# Patient Record
Sex: Female | Born: 1955
Health system: Southern US, Community
[De-identification: ages and names within clinical notes are randomized; demographics above are authoritative.]

## PROBLEM LIST (undated history)

## (undated) DIAGNOSIS — C539 Malignant neoplasm of cervix uteri, unspecified: Secondary | ICD-10-CM

## (undated) DIAGNOSIS — E785 Hyperlipidemia, unspecified: Secondary | ICD-10-CM

## (undated) DIAGNOSIS — C801 Malignant (primary) neoplasm, unspecified: Secondary | ICD-10-CM

## (undated) DIAGNOSIS — I10 Essential (primary) hypertension: Secondary | ICD-10-CM

## (undated) DIAGNOSIS — IMO0001 Reserved for inherently not codable concepts without codable children: Secondary | ICD-10-CM

## (undated) DIAGNOSIS — IMO0002 Reserved for concepts with insufficient information to code with codable children: Secondary | ICD-10-CM

## (undated) HISTORY — DX: Reserved for inherently not codable concepts without codable children: IMO0001

## (undated) HISTORY — DX: Reserved for concepts with insufficient information to code with codable children: IMO0002

## (undated) HISTORY — DX: Hyperlipidemia, unspecified: E78.5

## (undated) HISTORY — DX: Malignant neoplasm of cervix uteri, unspecified: C53.9

## (undated) HISTORY — PX: TUBAL LIGATION: SHX77

## (undated) HISTORY — PX: ABDOMINAL HYSTERECTOMY: SHX81

---

## 1997-12-02 ENCOUNTER — Ambulatory Visit (HOSPITAL_COMMUNITY): Admission: RE | Admit: 1997-12-02 | Discharge: 1997-12-02 | Payer: Self-pay | Admitting: Family Medicine

## 2000-05-15 ENCOUNTER — Encounter: Payer: Self-pay | Admitting: *Deleted

## 2000-05-15 ENCOUNTER — Encounter: Admission: RE | Admit: 2000-05-15 | Discharge: 2000-05-15 | Payer: Self-pay | Admitting: *Deleted

## 2000-07-01 ENCOUNTER — Other Ambulatory Visit: Admission: RE | Admit: 2000-07-01 | Discharge: 2000-07-01 | Payer: Self-pay | Admitting: *Deleted

## 2000-07-26 ENCOUNTER — Other Ambulatory Visit: Admission: RE | Admit: 2000-07-26 | Discharge: 2000-07-26 | Payer: Self-pay | Admitting: *Deleted

## 2000-07-26 ENCOUNTER — Encounter (INDEPENDENT_AMBULATORY_CARE_PROVIDER_SITE_OTHER): Payer: Self-pay

## 2000-08-13 ENCOUNTER — Other Ambulatory Visit: Admission: RE | Admit: 2000-08-13 | Discharge: 2000-08-13 | Payer: Self-pay | Admitting: *Deleted

## 2000-08-13 ENCOUNTER — Encounter (INDEPENDENT_AMBULATORY_CARE_PROVIDER_SITE_OTHER): Payer: Self-pay | Admitting: Specialist

## 2000-12-16 ENCOUNTER — Other Ambulatory Visit: Admission: RE | Admit: 2000-12-16 | Discharge: 2000-12-16 | Payer: Self-pay | Admitting: Obstetrics and Gynecology

## 2000-12-17 ENCOUNTER — Encounter (INDEPENDENT_AMBULATORY_CARE_PROVIDER_SITE_OTHER): Payer: Self-pay | Admitting: Specialist

## 2000-12-17 ENCOUNTER — Other Ambulatory Visit: Admission: RE | Admit: 2000-12-17 | Discharge: 2000-12-17 | Payer: Self-pay | Admitting: *Deleted

## 2001-11-14 ENCOUNTER — Encounter: Admission: RE | Admit: 2001-11-14 | Discharge: 2001-11-14 | Payer: Self-pay | Admitting: *Deleted

## 2001-11-14 ENCOUNTER — Encounter (INDEPENDENT_AMBULATORY_CARE_PROVIDER_SITE_OTHER): Payer: Self-pay | Admitting: Specialist

## 2001-11-14 ENCOUNTER — Encounter: Payer: Self-pay | Admitting: *Deleted

## 2003-06-09 ENCOUNTER — Encounter: Admission: RE | Admit: 2003-06-09 | Discharge: 2003-06-09 | Payer: Self-pay | Admitting: Obstetrics and Gynecology

## 2003-07-06 ENCOUNTER — Other Ambulatory Visit: Admission: RE | Admit: 2003-07-06 | Discharge: 2003-07-06 | Payer: Self-pay | Admitting: Obstetrics and Gynecology

## 2003-07-22 ENCOUNTER — Other Ambulatory Visit: Admission: RE | Admit: 2003-07-22 | Discharge: 2003-07-22 | Payer: Self-pay | Admitting: Obstetrics and Gynecology

## 2004-06-05 ENCOUNTER — Emergency Department (HOSPITAL_COMMUNITY): Admission: EM | Admit: 2004-06-05 | Discharge: 2004-06-05 | Payer: Self-pay | Admitting: Emergency Medicine

## 2004-12-04 ENCOUNTER — Encounter: Admission: RE | Admit: 2004-12-04 | Discharge: 2004-12-04 | Payer: Self-pay | Admitting: Obstetrics and Gynecology

## 2006-03-06 ENCOUNTER — Encounter: Admission: RE | Admit: 2006-03-06 | Discharge: 2006-03-06 | Payer: Self-pay | Admitting: Obstetrics and Gynecology

## 2006-03-08 ENCOUNTER — Emergency Department (HOSPITAL_COMMUNITY): Admission: EM | Admit: 2006-03-08 | Discharge: 2006-03-08 | Payer: Self-pay | Admitting: Family Medicine

## 2006-03-11 ENCOUNTER — Emergency Department (HOSPITAL_COMMUNITY): Admission: EM | Admit: 2006-03-11 | Discharge: 2006-03-11 | Payer: Self-pay | Admitting: Family Medicine

## 2006-03-13 ENCOUNTER — Encounter: Admission: RE | Admit: 2006-03-13 | Discharge: 2006-03-13 | Payer: Self-pay | Admitting: Emergency Medicine

## 2006-06-13 ENCOUNTER — Encounter: Admission: RE | Admit: 2006-06-13 | Discharge: 2006-06-13 | Payer: Self-pay | Admitting: Diagnostic Radiology

## 2006-06-20 ENCOUNTER — Encounter: Admission: RE | Admit: 2006-06-20 | Discharge: 2006-06-20 | Payer: Self-pay | Admitting: Diagnostic Radiology

## 2006-06-20 ENCOUNTER — Emergency Department (HOSPITAL_COMMUNITY): Admission: EM | Admit: 2006-06-20 | Discharge: 2006-06-20 | Payer: Self-pay | Admitting: Family Medicine

## 2006-06-27 ENCOUNTER — Ambulatory Visit: Payer: Self-pay | Admitting: Family Medicine

## 2006-07-08 ENCOUNTER — Ambulatory Visit: Payer: Self-pay | Admitting: Family Medicine

## 2006-07-08 ENCOUNTER — Other Ambulatory Visit: Admission: RE | Admit: 2006-07-08 | Discharge: 2006-07-08 | Payer: Self-pay | Admitting: Family Medicine

## 2006-07-16 ENCOUNTER — Encounter: Admission: RE | Admit: 2006-07-16 | Discharge: 2006-07-16 | Payer: Self-pay | Admitting: Diagnostic Radiology

## 2007-02-12 ENCOUNTER — Encounter: Admission: RE | Admit: 2007-02-12 | Discharge: 2007-02-12 | Payer: Self-pay | Admitting: Diagnostic Radiology

## 2007-12-04 ENCOUNTER — Encounter: Admission: RE | Admit: 2007-12-04 | Discharge: 2007-12-04 | Payer: Self-pay | Admitting: Family Medicine

## 2007-12-04 ENCOUNTER — Ambulatory Visit: Payer: Self-pay | Admitting: Family Medicine

## 2007-12-23 ENCOUNTER — Ambulatory Visit: Payer: Self-pay | Admitting: Family Medicine

## 2008-04-27 ENCOUNTER — Emergency Department (HOSPITAL_COMMUNITY): Admission: EM | Admit: 2008-04-27 | Discharge: 2008-04-27 | Payer: Self-pay | Admitting: Emergency Medicine

## 2009-07-02 HISTORY — PX: COLONOSCOPY: SHX174

## 2009-10-18 ENCOUNTER — Encounter: Payer: Self-pay | Admitting: Gastroenterology

## 2009-10-18 ENCOUNTER — Ambulatory Visit: Payer: Self-pay | Admitting: Family Medicine

## 2009-10-20 ENCOUNTER — Encounter (INDEPENDENT_AMBULATORY_CARE_PROVIDER_SITE_OTHER): Payer: Self-pay | Admitting: *Deleted

## 2009-10-24 ENCOUNTER — Ambulatory Visit: Payer: Self-pay | Admitting: Gastroenterology

## 2009-10-28 ENCOUNTER — Encounter: Admission: RE | Admit: 2009-10-28 | Discharge: 2009-10-28 | Payer: Self-pay | Admitting: Family Medicine

## 2009-10-31 ENCOUNTER — Ambulatory Visit: Payer: Self-pay | Admitting: Gastroenterology

## 2010-07-23 ENCOUNTER — Encounter: Payer: Self-pay | Admitting: Obstetrics and Gynecology

## 2010-07-23 ENCOUNTER — Encounter: Payer: Self-pay | Admitting: Diagnostic Radiology

## 2010-08-01 NOTE — Letter (Signed)
Summary: [facility] Instructions  [facility]  [address] [place], [place] [address]   Phone: [phone]  Fax: [phone]       Martha Koch    [birth year]    MRN: [id number]        Procedure Day Dorna Bloom:  Duanne Limerick  10/31/09     Arrival Time:  8:00AM     Procedure Time:  9:00AM     Location of Procedure:                    _ X_  [facility] (4th Floor)                        PREPARATION FOR COLONOSCOPY WITH MOVIPREP   Starting 5 days prior to your procedure 10/26/09 do not eat nuts, seeds, popcorn, corn, beans, peas,  salads, or any raw vegetables.  Do not take any fiber supplements (e.g. Metamucil, Citrucel, and Benefiber).  THE DAY BEFORE YOUR PROCEDURE         DATE: 10/30/09  DAY: SUNDAY  1.  Drink clear liquids the entire day-NO SOLID FOOD  2.  Do not drink anything colored red or purple.  Avoid juices with pulp.  No orange juice.  3.  Drink at least 64 oz. (8 glasses) of fluid/clear liquids during the day to prevent dehydration and help the prep work efficiently.  CLEAR LIQUIDS INCLUDE: Water Jello Ice Popsicles Tea (sugar ok, no milk/cream) Powdered fruit flavored drinks Coffee (sugar ok, no milk/cream) Gatorade Juice: apple, white grape, white cranberry  Lemonade Clear bullion, consomm, broth Carbonated beverages (any kind) Strained chicken noodle soup Hard Candy                             4.  In the morning, mix first dose of MoviPrep solution:    Empty 1 Pouch A and 1 Pouch B into the disposable container    Add lukewarm drinking water to the top line of the container. Mix to dissolve    Refrigerate (mixed solution should be used within 24 hrs)  5.  Begin drinking the prep at 5:00 p.m. The MoviPrep container is divided by 4 marks.   Every 15 minutes drink the solution down to the next mark (approximately 8 oz) until the full liter is complete.   6.  Follow completed prep with 16 oz of clear liquid of your choice (Nothing  red or purple).  Continue to drink clear liquids until bedtime.  7.  Before going to bed, mix second dose of MoviPrep solution:    Empty 1 Pouch A and 1 Pouch B into the disposable container    Add lukewarm drinking water to the top line of the container. Mix to dissolve    Refrigerate  THE DAY OF YOUR PROCEDURE      DATE: 10/31/09  DAY: MONDAY  Beginning at 4:00AM (5 hours before procedure):         1. Every 15 minutes, drink the solution down to the next mark (approx 8 oz) until the full liter is complete.  2. Follow completed prep with 16 oz. of clear liquid of your choice.    3. You may drink clear liquids until 7:00AM (2 HOURS BEFORE PROCEDURE).   MEDICATION INSTRUCTIONS  Unless otherwise instructed, you should take regular prescription medications with a small sip of water   as early as possible the morning  of your procedure.    Additional medication instructions: n/a         OTHER INSTRUCTIONS  You will need a responsible adult at least 56 years of age to accompany you and drive you home.   This person must remain in the waiting room during your procedure.  Wear loose fitting clothing that is easily removed.  Leave jewelry and other valuables at home.  However, you may wish to bring a book to read or  an iPod/MP3 player to listen to music as you wait for your procedure to start.  Remove all body piercing jewelry and leave at home.  Total time from sign-in until discharge is approximately 2-3 hours.  You should go home directly after your procedure and rest.  You can resume normal activities the  day after your procedure.  The day of your procedure you should not:   Drive   Make legal decisions   Operate machinery   Drink alcohol   Return to work  You will receive specific instructions about eating, activities and medications before you leave.    The above instructions have been reviewed and explained to me by   Sherren Kerns RN  October 24, 2009  10:08 AM    I fully understand and can verbalize these instructions _____________________________ Date _________

## 2010-08-01 NOTE — Letter (Signed)
Summary: Ronnald Nian MD  Ronnald Nian MD   Imported By: Sherian Rein 11/08/2009 09:18:25  _____________________________________________________________________  External Attachment:    Type:   Image     Comment:   External Document

## 2010-08-01 NOTE — Miscellaneous (Signed)
Summary: previsit/rm  Clinical Lists Changes  Medications: Added new medication of MOVIPREP 100 GM  SOLR (PEG-KCL-NACL-NASULF-NA ASC-C) As per prep instructions. - Signed Rx of MOVIPREP 100 GM  SOLR (PEG-KCL-NACL-NASULF-NA ASC-C) As per prep instructions.;  #1 x 0;  Signed;  Entered by: Sherren Kerns RN;  Authorized by: Mardella Layman MD [facility];  Method used: Electronically to [facility] [id number], [place], [place], [place]  [address], Ph: [phone], Fax: [phone] Allergies: Added new allergy or adverse reaction of PCN Added new allergy or adverse reaction of ASA Observations: Added new observation of NKA: F (10/24/2009 9:58)    Prescriptions: MOVIPREP 100 GM  SOLR (PEG-KCL-NACL-NASULF-NA ASC-C) As per prep instructions.  #1 x 0   Entered by:   Sherren Kerns RN   Authorized by:   Mardella Layman MD [facility]   Signed by:   Sherren Kerns RN on 10/24/2009   Method used:   Electronically to        [facility] [id number] (retail)       [address] [place], [place]  [address]       Ph: [phone]       Fax: [phone]   RxID:   [id number]

## 2010-08-01 NOTE — Procedures (Signed)
Summary: Colonoscopy  Patient: Martha Koch Note: All result statuses are Final unless otherwise noted.  Tests: (1) Colonoscopy (COL)   COL Colonoscopy           DONE     [facility]     [address]     [place], [place]  [address]           COLONOSCOPY PROCEDURE REPORT           PATIENT:  Martha Koch, Martha Koch  MR#:  [id number]     BIRTHDATE:  05-11-1956, 53 yrs. old  GENDER:  female     ENDOSCOPIST:  Vania Rea. Jarold Motto, MD, [facility]     REF. BY:  Sharlot Gowda, M.D.     PROCEDURE DATE:  10/31/2009     PROCEDURE:  Average-risk screening colonoscopy     G0121     ASA CLASS:  Class I     INDICATIONS:  Routine Risk Screening     MEDICATIONS:   Fentanyl 50 mcg IV, Versed 7 mg IV           DESCRIPTION OF PROCEDURE:   After the risks benefits and     alternatives of the procedure were thoroughly explained, informed     consent was obtained.  Digital rectal exam was performed and     revealed no abnormalities.   The LB CF-[id number] endoscope     was introduced through the anus and advanced to the cecum, which     was identified by both the appendix and ileocecal valve, without     limitations.  The quality of the prep was good, using MoviPrep.     The instrument was then slowly withdrawn as the colon was fully     examined.     <<PROCEDUREIMAGES>>           FINDINGS:  No polyps or cancers were seen.  This was otherwise a     normal examination of the colon.   Retroflexed views in the rectum     revealed no abnormalities.    The scope was then withdrawn from     the patient and the procedure completed.           COMPLICATIONS:  None     ENDOSCOPIC IMPRESSION:     1) No polyps or cancers     2) Otherwise normal examination     RECOMMENDATIONS:     1) Continue current colorectal screening recommendations for     "routine risk" patients with a repeat colonoscopy in 10 years.     REPEAT EXAM:  No           ______________________________     Vania Rea. Jarold Motto, MD,  Clementeen Graham           CC:           n.     eSIGNED:   Vania Rea. [facility] at 10/31/2009 09:55 AM           Annye Rusk, [id number]  Note: An exclamation mark (!) indicates a result that was not dispersed into the flowsheet. Document Creation Date: 10/31/2009 9:56 AM _______________________________________________________________________  (1) Order result status: Final Collection or observation date-time: 10/31/2009 09:47 Requested date-time:  Receipt date-time:  Reported date-time:  Referring Physician:   Ordering Physician: Sheryn Bison [phone]) Specimen Source:  Source: Launa Grill Order Number: [id number] Lab site:   Appended Document: Colonoscopy    Clinical Lists Changes  Observations: Added new observation of COLONNXTDUE: 10/2019 (  10/31/2009 16:14)     

## 2012-06-02 ENCOUNTER — Other Ambulatory Visit: Payer: Self-pay | Admitting: Family Medicine

## 2012-06-02 DIAGNOSIS — Z1231 Encounter for screening mammogram for malignant neoplasm of breast: Secondary | ICD-10-CM

## 2012-07-07 ENCOUNTER — Ambulatory Visit
Admission: RE | Admit: 2012-07-07 | Discharge: 2012-07-07 | Disposition: A | Discharge status: Home / Self Care | Payer: BC Managed Care – PPO | Source: Ambulatory Visit | Attending: Family Medicine | Admitting: Family Medicine

## 2012-07-07 DIAGNOSIS — Z1231 Encounter for screening mammogram for malignant neoplasm of breast: Secondary | ICD-10-CM

## 2014-02-22 ENCOUNTER — Emergency Department (HOSPITAL_COMMUNITY)
Admission: EM | Admit: 2014-02-22 | Discharge: 2014-02-23 | Disposition: A | Discharge status: Home / Self Care | Payer: Self-pay | Attending: Emergency Medicine | Admitting: Emergency Medicine

## 2014-02-22 ENCOUNTER — Encounter (HOSPITAL_COMMUNITY): Payer: Self-pay | Admitting: Emergency Medicine

## 2014-02-22 DIAGNOSIS — N939 Abnormal uterine and vaginal bleeding, unspecified: Secondary | ICD-10-CM

## 2014-02-22 DIAGNOSIS — H9209 Otalgia, unspecified ear: Secondary | ICD-10-CM | POA: Insufficient documentation

## 2014-02-22 DIAGNOSIS — Z880 Allergy status to penicillin: Secondary | ICD-10-CM | POA: Insufficient documentation

## 2014-02-22 DIAGNOSIS — Z79899 Other long term (current) drug therapy: Secondary | ICD-10-CM | POA: Insufficient documentation

## 2014-02-22 DIAGNOSIS — F17200 Nicotine dependence, unspecified, uncomplicated: Secondary | ICD-10-CM | POA: Insufficient documentation

## 2014-02-22 DIAGNOSIS — I10 Essential (primary) hypertension: Secondary | ICD-10-CM | POA: Insufficient documentation

## 2014-02-22 DIAGNOSIS — R634 Abnormal weight loss: Secondary | ICD-10-CM | POA: Insufficient documentation

## 2014-02-22 DIAGNOSIS — D251 Intramural leiomyoma of uterus: Secondary | ICD-10-CM | POA: Insufficient documentation

## 2014-02-22 DIAGNOSIS — A599 Trichomoniasis, unspecified: Secondary | ICD-10-CM

## 2014-02-22 DIAGNOSIS — N898 Other specified noninflammatory disorders of vagina: Secondary | ICD-10-CM | POA: Insufficient documentation

## 2014-02-22 NOTE — ED Notes (Signed)
Pt states last night her right ear was hurting so bad it woke her up out of her sleep  Pt states she has had dizziness associated with the pain  Pt states last night she called EMS and they came out and checked her and her blood pressure was high  Pt states she has not had any sleep due to the ear pain and she came today for her ear and her blood pressure  Pt has never been on blood pressure medication

## 2014-02-22 NOTE — ED Notes (Signed)
Pt states she has been having brown discharge for several months and would like to have that checked while she is here

## 2014-02-23 ENCOUNTER — Encounter: Payer: Self-pay | Admitting: Obstetrics & Gynecology

## 2014-02-23 ENCOUNTER — Emergency Department (HOSPITAL_COMMUNITY): Payer: Self-pay

## 2014-02-23 LAB — URINALYSIS, ROUTINE W REFLEX MICROSCOPIC
Bilirubin Urine: NEGATIVE
GLUCOSE, UA: NEGATIVE mg/dL
Ketones, ur: NEGATIVE mg/dL
NITRITE: NEGATIVE
PROTEIN: NEGATIVE mg/dL
Specific Gravity, Urine: 1.016 (ref 1.005–1.030)
UROBILINOGEN UA: 0.2 mg/dL (ref 0.0–1.0)
pH: 6.5 (ref 5.0–8.0)

## 2014-02-23 LAB — WET PREP, GENITAL: YEAST WET PREP: NONE SEEN

## 2014-02-23 LAB — CBC WITH DIFFERENTIAL/PLATELET
Basophils Absolute: 0 10*3/uL (ref 0.0–0.1)
Basophils Relative: 0 % (ref 0–1)
Eosinophils Absolute: 0.1 10*3/uL (ref 0.0–0.7)
Eosinophils Relative: 1 % (ref 0–5)
HCT: 39.5 % (ref 36.0–46.0)
Hemoglobin: 13.1 g/dL (ref 12.0–15.0)
Lymphocytes Relative: 49 % — ABNORMAL HIGH (ref 12–46)
Lymphs Abs: 3.4 10*3/uL (ref 0.7–4.0)
MCH: 26.3 pg (ref 26.0–34.0)
MCHC: 33.2 g/dL (ref 30.0–36.0)
MCV: 79.3 fL (ref 78.0–100.0)
Monocytes Absolute: 0.5 10*3/uL (ref 0.1–1.0)
Monocytes Relative: 7 % (ref 3–12)
Neutro Abs: 3 10*3/uL (ref 1.7–7.7)
Neutrophils Relative %: 43 % (ref 43–77)
Platelets: 307 10*3/uL (ref 150–400)
RBC: 4.98 MIL/uL (ref 3.87–5.11)
RDW: 14.5 % (ref 11.5–15.5)
WBC: 7 10*3/uL (ref 4.0–10.5)

## 2014-02-23 LAB — URINE MICROSCOPIC-ADD ON

## 2014-02-23 LAB — BASIC METABOLIC PANEL
ANION GAP: 14 (ref 5–15)
BUN: 10 mg/dL (ref 6–23)
CO2: 25 mEq/L (ref 19–32)
CREATININE: 0.94 mg/dL (ref 0.50–1.10)
Calcium: 9.7 mg/dL (ref 8.4–10.5)
Chloride: 103 mEq/L (ref 96–112)
GFR calc non Af Amer: 66 mL/min — ABNORMAL LOW (ref >90)
GFR, EST AFRICAN AMERICAN: 76 mL/min — AB (ref >90)
Glucose, Bld: 102 mg/dL — ABNORMAL HIGH (ref 70–99)
Potassium: 4.1 mEq/L (ref 3.7–5.3)
Sodium: 142 mEq/L (ref 137–147)

## 2014-02-23 MED ORDER — ACETAMINOPHEN 325 MG PO TABS
650.0000 mg | ORAL_TABLET | Freq: Once | ORAL | Status: AC
  Administered 2014-02-23: 650 mg via ORAL

## 2014-02-23 MED ORDER — METRONIDAZOLE 500 MG PO TABS
2000.0000 mg | ORAL_TABLET | Freq: Once | ORAL | Status: AC
  Administered 2014-02-23: 2000 mg via ORAL
  Filled 2014-02-23: qty 4

## 2014-02-23 NOTE — ED Notes (Signed)
Pt states she has pain in her her and discharge from vagina.

## 2014-02-23 NOTE — ED Provider Notes (Signed)
CSN: [id number]     Arrival date & time 02/22/14  1918 History   First MD Initiated Contact with Patient 02/22/14 2306     Chief Complaint  Patient presents with  . Otalgia  . Vaginal Discharge     (Consider location/radiation/quality/duration/timing/severity/associated sxs/prior Treatment) HPI Comments: 58 year old female  with no significant medical history, smoker presents with vaginal discharge brown for the past several months, weight loss and right ear pain. Patient has had little to discharge each day brown discoloration. Patient has not been sexually active for years, denies bleeding. No known ovarian or uterine pathology in the past. No known cancer. Patient has not followed up regularly with a physician and does not have a current gynecologist. Patient also has mild right ear pain constant decrease his ability to sleep. Patient is not on medicines for blood pressure and has no known diagnosis.   Patient is a 58 y.o. female presenting with ear pain and vaginal discharge. The history is provided by the patient.  Otalgia Associated symptoms: no abdominal pain, no congestion, no fever, no headaches, no neck pain, no rash and no vomiting   Vaginal Discharge Associated symptoms: no abdominal pain, no dysuria, no fever and no vomiting     History reviewed. No pertinent past medical history. History reviewed. No pertinent past surgical history. Family History  Problem Relation Age of Onset  . Cancer Other   . Diabetes Other    History  Substance Use Topics  . Smoking status: Current Every Day Smoker    Types: Cigarettes  . Smokeless tobacco: Not on file  . Alcohol Use: No   OB History   Grav Para Term Preterm Abortions TAB SAB Ect Mult Living                 Review of Systems  Constitutional: Positive for unexpected weight change (30 pounds 2 months). Negative for fever and chills.  HENT: Positive for ear pain. Negative for congestion.   Eyes: Negative for visual  disturbance.  Respiratory: Negative for shortness of breath.   Cardiovascular: Negative for chest pain.  Gastrointestinal: Negative for vomiting and abdominal pain.  Genitourinary: Positive for vaginal discharge. Negative for dysuria, flank pain and decreased urine volume.  Musculoskeletal: Negative for back pain, neck pain and neck stiffness.  Skin: Negative for rash.  Neurological: Negative for light-headedness and headaches.      Allergies  Aspirin and Penicillins  Home Medications   Prior to Admission medications   Medication Sig Start Date End Date Taking? Authorizing Provider  Multiple Vitamin (MULTIVITAMIN WITH MINERALS) TABS tablet Take 1 tablet by mouth daily.   Yes Historical Provider, MD   BP 167/87  Pulse 74  Temp(Src) 97.8 F (36.6 C) (Oral)  Resp 16  SpO2 100% Physical Exam  Nursing note and vitals reviewed. Constitutional: She is oriented to person, place, and time. She appears well-developed and well-nourished.  HENT:  Head: Normocephalic and atraumatic.  Eyes: Conjunctivae are normal. Right eye exhibits no discharge. Left eye exhibits no discharge.  Neck: Normal range of motion. Neck supple. No tracheal deviation present.  Cardiovascular: Normal rate and regular rhythm.   Pulmonary/Chest: Effort normal and breath sounds normal.  Abdominal: Soft. She exhibits no distension. There is no tenderness. There is no guarding.  Genitourinary:    friable cervix, mild brown discharge and small amount of, no focal tenderness on exam.  Musculoskeletal: She exhibits no edema.  Neurological: She is alert and oriented to person, place, and time.  Skin: Skin is warm. No rash noted.  Psychiatric: She has a normal mood and affect.    ED Course  Procedures (including critical care time) Labs Review Labs Reviewed  WET PREP, GENITAL - Abnormal; Notable for the following:    Trich, Wet Prep MODERATE (*)    Clue Cells Wet Prep HPF POC RARE (*)    WBC, Wet Prep HPF POC FEW  (*)    All other components within normal limits  URINALYSIS, ROUTINE W REFLEX MICROSCOPIC - Abnormal; Notable for the following:    APPearance CLOUDY (*)    Hgb urine dipstick SMALL (*)    Leukocytes, UA MODERATE (*)    All other components within normal limits  CBC WITH DIFFERENTIAL - Abnormal; Notable for the following:    Lymphocytes Relative 49 (*)    All other components within normal limits  BASIC METABOLIC PANEL - Abnormal; Notable for the following:    Glucose, Bld 102 (*)    GFR calc non Af Amer 66 (*)    GFR calc Af Amer 76 (*)    All other components within normal limits  URINE MICROSCOPIC-ADD ON - Abnormal; Notable for the following:    Bacteria, UA FEW (*)    All other components within normal limits  GC/CHLAMYDIA PROBE AMP    Imaging Review US Transvaginal Non-ob  02/23/2014   CLINICAL DATA:  Vaginal discharge and bleeding. Postmenopausal patient.  EXAM: TRANSABDOMINAL AND TRANSVAGINAL ULTRASOUND OF PELVIS  TECHNIQUE: Both transabdominal and transvaginal ultrasound examinations of the pelvis were performed. Transabdominal technique was performed for global imaging of the pelvis including uterus, ovaries, adnexal regions, and pelvic cul-de-sac. It was necessary to proceed with endovaginal exam following the transabdominal exam to visualize the ovaries and endometrium.  COMPARISON:  None  FINDINGS: Uterus  Measurements: 9.3 x 6.2 x 9.4 cm. Heterogeneous nodular enlargement of the uterus consistent with multiple fibroids. The largest measuring 2.7 cm maximal diameter.  Endometrium  Thickness: 5.8 mm. Limited visualization due to displacement and distortion because of the uterine fibroids.  Right ovary  Measurements: 8.1 x 7 x 7.6 cm. Large cyst in the right ovary measures 5.7 x 6.4 x 6.5 cm. Single thickened septation with mild peripheral nodularity. No evidence of flow in the nodules or septation. Homogeneous anechoic appearance otherwise.  Left ovary  Not visualized.  No adnexal  masses seen on the left.  Other findings  No free fluid.  IMPRESSION: Multiple uterine fibroids. 6.5 cm cyst with thick septation and nonvascular mural nodules in the right ovary. This lesion is indeterminate and follow-up MRI or surgical evaluation should be considered. This recommendation follows the consensus statement: Management of Asymptomatic Ovarian and Other Adnexal Cysts Imaged at [place]: Society of Radiologists in [place]. Radiology 2010; [phone].   Electronically Signed   By: Lucienne Capers M.D.   On: 02/23/2014 01:57   US Pelvis Complete  02/23/2014   CLINICAL DATA:  Vaginal discharge and bleeding. Postmenopausal patient.  EXAM: TRANSABDOMINAL AND TRANSVAGINAL ULTRASOUND OF PELVIS  TECHNIQUE: Both transabdominal and transvaginal ultrasound examinations of the pelvis were performed. Transabdominal technique was performed for global imaging of the pelvis including uterus, ovaries, adnexal regions, and pelvic cul-de-sac. It was necessary to proceed with endovaginal exam following the transabdominal exam to visualize the ovaries and endometrium.  COMPARISON:  None  FINDINGS: Uterus  Measurements: 9.3 x 6.2 x 9.4 cm. Heterogeneous nodular enlargement of the uterus consistent with multiple fibroids. The largest measuring 2.7 cm maximal diameter.  Endometrium  Thickness: 5.8 mm. Limited visualization due to displacement and distortion because of the uterine fibroids.  Right ovary  Measurements: 8.1 x 7 x 7.6 cm. Large cyst in the right ovary measures 5.7 x 6.4 x 6.5 cm. Single thickened septation with mild peripheral nodularity. No evidence of flow in the nodules or septation. Homogeneous anechoic appearance otherwise.  Left ovary  Not visualized.  No adnexal masses seen on the left.  Other findings  No free fluid.  IMPRESSION: Multiple uterine fibroids. 6.5 cm cyst with thick septation and nonvascular mural nodules in the right ovary. This lesion is indeterminate and  follow-up MRI or surgical evaluation should be considered. This recommendation follows the consensus statement: Management of Asymptomatic Ovarian and Other Adnexal Cysts Imaged at [place]: Society of Radiologists in [place]. Radiology 2010; [phone].   Electronically Signed   By: Lucienne Capers M.D.   On: 02/23/2014 01:57     EKG Interpretation None      MDM   Final diagnoses:  Essential hypertension  Vaginal bleeding  Vaginal discharge  Weight loss  Trichomonas  Patient with vaginal discharge weight loss concern for vaginal infection versus malignancy. Patient is not a primary Dr. thus ultrasound was ordered in ER to help arrange close followup with gynecology. Trichomonas positive Flagyl given. Blood pressure mild elevated discussed close followup with the [facility]  Ultrasound results reviewed with patient and importance to followup for likely MRI pelvis and further evaluation might oncology. Treatment for Trichomonas in ER.  Results and differential diagnosis were discussed with the patient/parent/guardian. Close follow up outpatient was discussed, comfortable with the plan.   Medications  metroNIDAZOLE (FLAGYL) tablet 2,000 mg (2,000 mg Oral Given 02/23/14 0238)  acetaminophen (TYLENOL) tablet 650 mg (650 mg Oral Given 02/23/14 0238)    Filed Vitals:   02/22/14 2144 02/22/14 2146 02/23/14 0010 02/23/14 0240  BP:  171/102 167/87 145/87  Pulse:  78 74 81  Temp:  97.8 F (36.6 C)  97.5 F (36.4 C)  TempSrc: Oral Oral  Oral  Resp:  18 16 16   SpO2:  100% 100% 100%            Mariea Clonts, MD 02/23/14 [phone]

## 2014-02-23 NOTE — Discharge Instructions (Signed)
If you were given medicines take as directed.  If you are on coumadin or contraceptives realize their levels and effectiveness is altered by many different medicines.  If you have any reaction (rash, tongues swelling, other) to the medicines stop taking and see a physician.   Please follow up as directed and return to the ER or see a physician for new or worsening symptoms.  Thank you. Filed Vitals:   02/22/14 1928 02/22/14 2144 02/22/14 2146 02/23/14 0010  BP: 155/96  171/102 167/87  Pulse: 95  78 74  Temp: 98.5 F (36.9 C)  97.8 F (36.6 C)   TempSrc: Oral Oral Oral   Resp: 20  18 16   SpO2: 100%  100% 100%   See gynecology to arrange further imaging of fibroid/ cyst.

## 2014-02-23 NOTE — ED Notes (Signed)
Patient is resting comfortably. 

## 2014-02-24 LAB — GC/CHLAMYDIA PROBE AMP
CT Probe RNA: NEGATIVE
GC PROBE AMP APTIMA: NEGATIVE

## 2014-03-02 ENCOUNTER — Ambulatory Visit: Payer: Self-pay | Attending: Family Medicine | Admitting: Family Medicine

## 2014-03-02 ENCOUNTER — Encounter: Payer: Self-pay | Admitting: Family Medicine

## 2014-03-02 VITALS — BP 160/99 | HR 79 | Temp 97.8°F | Resp 18 | Ht 67.0 in | Wt 126.2 lb

## 2014-03-02 DIAGNOSIS — Z113 Encounter for screening for infections with a predominantly sexual mode of transmission: Secondary | ICD-10-CM

## 2014-03-02 DIAGNOSIS — R634 Abnormal weight loss: Secondary | ICD-10-CM | POA: Insufficient documentation

## 2014-03-02 DIAGNOSIS — F17200 Nicotine dependence, unspecified, uncomplicated: Secondary | ICD-10-CM | POA: Insufficient documentation

## 2014-03-02 DIAGNOSIS — Z0000 Encounter for general adult medical examination without abnormal findings: Secondary | ICD-10-CM | POA: Insufficient documentation

## 2014-03-02 DIAGNOSIS — I10 Essential (primary) hypertension: Secondary | ICD-10-CM | POA: Insufficient documentation

## 2014-03-02 DIAGNOSIS — Z23 Encounter for immunization: Secondary | ICD-10-CM

## 2014-03-02 DIAGNOSIS — R030 Elevated blood-pressure reading, without diagnosis of hypertension: Secondary | ICD-10-CM | POA: Insufficient documentation

## 2014-03-02 LAB — HEPATIC FUNCTION PANEL
ALBUMIN: 4.4 g/dL (ref 3.5–5.2)
ALT: 8 U/L (ref 0–35)
AST: 15 U/L (ref 0–37)
Alkaline Phosphatase: 72 U/L (ref 39–117)
Bilirubin, Direct: 0.1 mg/dL (ref 0.0–0.3)
Indirect Bilirubin: 0.2 mg/dL (ref 0.2–1.2)
Total Bilirubin: 0.3 mg/dL (ref 0.2–1.2)
Total Protein: 7.1 g/dL (ref 6.0–8.3)

## 2014-03-02 LAB — LIPID PANEL
CHOL/HDL RATIO: 2.6 ratio
CHOLESTEROL: 162 mg/dL (ref 0–200)
HDL: 63 mg/dL (ref >39)
LDL Cholesterol: 87 mg/dL (ref 0–99)
Triglycerides: 58 mg/dL (ref <150)
VLDL: 12 mg/dL (ref 0–40)

## 2014-03-02 LAB — TSH: TSH: 0.825 u[IU]/mL (ref 0.350–4.500)

## 2014-03-02 MED ORDER — AMLODIPINE BESYLATE 10 MG PO TABS: 10.0000 mg | ORAL_TABLET | Freq: Every day | ORAL | Status: DC

## 2014-03-02 NOTE — Assessment & Plan Note (Addendum)
A: unintentional. Denies depression. Denies constitutional symptoms suggestive of potential malignancy. Recent normal CBC and BMP.  Normal colonoscopy 10/2009.  P: TSH CXR LFTs Obtain screening pap and mammogram.

## 2014-03-02 NOTE — Assessment & Plan Note (Addendum)
Screening HIV  Screening HIV negative  

## 2014-03-02 NOTE — Progress Notes (Signed)
Establish Care HFU./Had ear problems possible due to HTN

## 2014-03-02 NOTE — Progress Notes (Signed)
   Subjective:    Patient ID: Martha Koch, female    DOB: 10/04/1955, 58 y.o.   MRN: [id number] CC: ED f/u elevated BP, weight loss 20 # last year  HPI 58 year old female presents to establish care and discuss the following:  #1 elevated blood pressure: Patient has been told 5 years ago that she had elevate the blood pressure x1. She's never been on antihypertensives. She admits to some lightheadedness upon standing quickly. She denies headache, vision changes, chest pain, shortness of breath, lower extremity edema, orthopnea, palpitations. She is a smoker she smokes half a pack a day for the past 40 years. She has been able to quit in the past which was pregnant.  #2 weight loss: Patient reports a 20 pound unintentional weight loss over the past year. She's always been slim but gained weight after having children, and  is now losing it. She denies depression, palpitations, cough, fever, chills, decreased appetite, diarrhea, blood in stools. She has intermittent constipation when she eats dairy products. She reports having a normal colonoscopy at the age of 57. Chest reports having normal mammograms. She does have family history of breast cancer on her mother side in 31 of her maternal aunts.  Soc hx: chronic smoker  Review of Systems As per HPI     Objective:   Physical Exam BP 160/99  Pulse 79  Temp(Src) 97.8 F (36.6 C) (Oral)  Resp 18  Ht 5\' 7"  (1.702 m)  Wt 126 lb 3.2 oz (57.244 kg)  BMI 19.76 kg/m2  SpO2 99% General appearance: alert, cooperative and no distress Eyes: conjunctivae/corneas clear. PERRL, EOM's intact.  Ears: normal TM's and external ear canals both ears Nose: Nares normal. Septum midline. Mucosa normal. No drainage or sinus tenderness. Throat: normal findings: lips normal without lesions, tongue midline and normal and oropharynx pink & moist without lesions or evidence of thrush and abnormal findings: upper and lower dentures  Neck: no adenopathy, no carotid  bruit, supple, symmetrical, trachea midline and thyroid not enlarged, symmetric, no tenderness/mass/nodules Lungs: clear to auscultation bilaterally Heart: regular rate and rhythm, S1, S2 normal, no murmur, click, rub or gallop Abdomen: soft, non-tender; bowel sounds normal; no masses,  no organomegaly Extremities: extremities normal, atraumatic, no cyanosis or edema Lymph nodes: Cervical, supraclavicular, and axillary nodes normal.     Assessment & Plan:

## 2014-03-02 NOTE — Patient Instructions (Addendum)
Ms. Wandel,  Thank you for coming in today. It was a pleasure meeting you. I look forward to be a primary doctor.  1. regarding hypertension: The first thing smoking cessation. Please start Norvasc 10 mg daily  Smoking cessation support: smoking cessation hotline: 1-800-QUIT-NOW.  Smoking cessation classes are available through [facility] and [facility]. Call ([phone] or visit our website at [email or web address]  2. regarding weight loss Screen TSH, CBC and CMP CXR  3. Please call Rolena Infante, [phone],  with the BCCCP (breast and cervical cancer control program) at the [facility] to set up an appointment to verify eligibility for a breast exam, mammogram, ultrasound. If you qualify this will be set up at [facility].  F/u in 2 weeks for nurse visit BP check  F/u in 4 weeks with me for HTN f/u and pap.   Dr. Adrian Blackwater

## 2014-03-02 NOTE — Assessment & Plan Note (Signed)
A: smoker, long time, desires to quit. P: smoking cessation info provided

## 2014-03-02 NOTE — Assessment & Plan Note (Addendum)
A: elevated  BP x 2 P: Smoking cessation Norvasc 10 mg daily RN f/u in 2 weeks PCP f/u in 4 weeks  Lipid panel along with risk factors puts patient in statin benefit group to lower risk of heart disease and stroke, please start lipitor 20 mg daily, sent to on site pharmacy.

## 2014-03-03 ENCOUNTER — Telehealth: Payer: Self-pay | Admitting: *Deleted

## 2014-03-03 LAB — HIV ANTIBODY (ROUTINE TESTING W REFLEX): HIV: NONREACTIVE

## 2014-03-03 MED ORDER — ATORVASTATIN CALCIUM 20 MG PO TABS: 20.0000 mg | ORAL_TABLET | Freq: Every day | ORAL | Status: DC

## 2014-03-03 NOTE — Telephone Encounter (Signed)
Message copied by Betti Cruz on Wed Mar 03, 2014  2:23 PM ------      Message from: [name]      Created: Wed Mar 03, 2014  9:31 AM       Please call patient.      Normal TSH, HIV, LFTs.       Lipid panel along with risk factors puts patient in statin benefit group to lower risk of heart disease and stroke, please start lipitor 20 mg daily, sent to on site pharmacy.        ------

## 2014-03-03 NOTE — Addendum Note (Signed)
Addended by: [name] on: 03/03/2014 09:34 AM   Modules accepted: Orders

## 2014-03-03 NOTE — Telephone Encounter (Signed)
Left voice message to return call 

## 2014-03-16 ENCOUNTER — Ambulatory Visit: Payer: Self-pay | Attending: Family Medicine

## 2014-03-16 NOTE — Patient Instructions (Signed)
Today you blood pressure is within normal limits. continue taking your medications as prescribed.

## 2014-03-16 NOTE — Progress Notes (Unsigned)
Pt was put on BP medications because her BP was elevated. Pt states that she is taking the medication everyday and today her BP is WNL.

## 2014-03-30 ENCOUNTER — Encounter: Payer: Self-pay | Admitting: Family Medicine

## 2014-03-30 ENCOUNTER — Ambulatory Visit: Payer: Self-pay | Attending: Family Medicine | Admitting: Family Medicine

## 2014-03-30 VITALS — BP 127/78 | HR 89 | Temp 98.7°F | Resp 18 | Ht 67.0 in | Wt 128.0 lb

## 2014-03-30 DIAGNOSIS — R634 Abnormal weight loss: Secondary | ICD-10-CM | POA: Insufficient documentation

## 2014-03-30 DIAGNOSIS — I10 Essential (primary) hypertension: Secondary | ICD-10-CM | POA: Insufficient documentation

## 2014-03-30 DIAGNOSIS — F17200 Nicotine dependence, unspecified, uncomplicated: Secondary | ICD-10-CM | POA: Insufficient documentation

## 2014-03-30 DIAGNOSIS — IMO0002 Reserved for concepts with insufficient information to code with codable children: Secondary | ICD-10-CM | POA: Insufficient documentation

## 2014-03-30 MED ORDER — ATORVASTATIN CALCIUM 20 MG PO TABS: 20.0000 mg | ORAL_TABLET | Freq: Every day | ORAL | Status: DC

## 2014-03-30 MED ORDER — AMLODIPINE BESYLATE 10 MG PO TABS: 10.0000 mg | ORAL_TABLET | Freq: Every day | ORAL | Status: DC

## 2014-03-30 NOTE — Progress Notes (Signed)
F/U HTN 

## 2014-03-30 NOTE — Assessment & Plan Note (Signed)
A: patient feeling great. Weight up 2 lbs.  P: Monitor q visit

## 2014-03-30 NOTE — Assessment & Plan Note (Signed)
A: Blood pressure at goal. Meds: compliant  P: I will continue the patient's current medication regimen since her blood pressure is at goal.  Refilled norvasc x 90 day supply

## 2014-03-30 NOTE — Progress Notes (Signed)
   Subjective:    Patient ID: Martha Koch, female    DOB: [birth year], 58 y.o.   MRN: [id number] CC: f/u HTN and pap  HPI 58 year old female presents for followup hypertension and for Pap smear:  #1 hypertension: doing well. Vision improved. No CP, SOB, edema.   Social history: Smoker Review of Systems As per history of present illness    Objective:   Physical Exam BP 127/78  Pulse 89  Temp(Src) 98.7 F (37.1 C) (Oral)  Resp 18  Ht 5\' 7"  (1.702 m)  Wt 128 lb (58.06 kg)  BMI 20.04 kg/m2  SpO2 100% General appearance: alert, cooperative and no distress Lungs: clear to auscultation bilaterally Heart: regular rate and rhythm, S1, S2 normal, no murmur, click, rub or gallop Extremities: extremities normal, atraumatic, no cyanosis or edema       Assessment & Plan:

## 2014-03-30 NOTE — Patient Instructions (Signed)
Ms. Mandrell,  Thank you for coming back to see me today. Your BP is great! Continue current regimen and plan Continue to work on smoking cessation  F/u in 6 months  Dr. Adrian Blackwater

## 2014-04-01 ENCOUNTER — Encounter: Payer: Self-pay | Admitting: Obstetrics & Gynecology

## 2014-04-01 ENCOUNTER — Encounter: Payer: BC Managed Care – PPO | Admitting: Obstetrics & Gynecology

## 2014-04-01 ENCOUNTER — Ambulatory Visit (INDEPENDENT_AMBULATORY_CARE_PROVIDER_SITE_OTHER): Payer: BC Managed Care – PPO | Admitting: Obstetrics & Gynecology

## 2014-04-01 VITALS — BP 147/80 | HR 89 | Temp 97.6°F | Ht 67.0 in | Wt 128.3 lb

## 2014-04-01 DIAGNOSIS — Z118 Encounter for screening for other infectious and parasitic diseases: Secondary | ICD-10-CM

## 2014-04-01 DIAGNOSIS — Z113 Encounter for screening for infections with a predominantly sexual mode of transmission: Secondary | ICD-10-CM

## 2014-04-01 DIAGNOSIS — Z124 Encounter for screening for malignant neoplasm of cervix: Secondary | ICD-10-CM

## 2014-04-01 DIAGNOSIS — R1900 Intra-abdominal and pelvic swelling, mass and lump, unspecified site: Secondary | ICD-10-CM

## 2014-04-01 DIAGNOSIS — Z1151 Encounter for screening for human papillomavirus (HPV): Secondary | ICD-10-CM

## 2014-04-01 LAB — BASIC METABOLIC PANEL
BUN: 12 mg/dL (ref 6–23)
CO2: 27 mEq/L (ref 19–32)
Calcium: 9.8 mg/dL (ref 8.4–10.5)
Chloride: 104 mEq/L (ref 96–112)
Creat: 0.83 mg/dL (ref 0.50–1.10)
GLUCOSE: 98 mg/dL (ref 70–99)
Potassium: 4.5 mEq/L (ref 3.5–5.3)
Sodium: 140 mEq/L (ref 135–145)

## 2014-04-01 NOTE — Progress Notes (Signed)
Subjective:     Patient ID: Martha Koch, female   DOB: [birth year], 58 y.o.   MRN: [id number]  HPI Pt reports c/o 'EAR pain' and started to feel dizzy so she called EMS.  The next day she was still feeling dizzy and lightheaded so she was seen in the ED.  She reported vaginal discharge and was noted to have a mass on her ovary and was subsequently referred here.  She reports that she has had heavy vaginal discharge for 6 months or more.  She denies bleeding.  She recalls having a LEEP and reports that she has not been seen for eval since the LEEP was completed. The ear pain has since resolved.  Pt now on BP meds.  Review of Systems     Objective:   Physical Exam BP 147/80  Pulse 89  Temp(Src) 97.6 F (36.4 C)  Ht 5\' 7"  (1.702 m)  Wt 128 lb 4.8 oz (58.196 kg)  BMI 20.09 kg/m2 Pt in NAD Abd: soft- NT, ND GU: EGBUS: no lesions Vagina: no blood in vault Cervix: hard and friable.  irreg contour- very suspicious for severe dysplasia or carcinoma Uterus: small, mobile Adnexa: no masses palpated- exam limited by pt discomfort        02/23/2014 CLINICAL DATA: Vaginal discharge and bleeding. Postmenopausal  patient.  EXAM:  TRANSABDOMINAL AND TRANSVAGINAL ULTRASOUND OF PELVIS  TECHNIQUE:  Both transabdominal and transvaginal ultrasound examinations of the  pelvis were performed. Transabdominal technique was performed for  global imaging of the pelvis including uterus, ovaries, adnexal  regions, and pelvic cul-de-sac. It was necessary to proceed with  endovaginal exam following the transabdominal exam to visualize the  ovaries and endometrium.  COMPARISON: None  FINDINGS:  Uterus  Measurements: 9.3 x 6.2 x 9.4 cm. Heterogeneous nodular enlargement  of the uterus consistent with multiple fibroids. The largest  measuring 2.7 cm maximal diameter.  Endometrium  Thickness: 5.8 mm. Limited visualization due to displacement and  distortion because of the uterine fibroids.  Right ovary   Measurements: 8.1 x 7 x 7.6 cm. Large cyst in the right ovary  measures 5.7 x 6.4 x 6.5 cm. Single thickened septation with mild  peripheral nodularity. No evidence of flow in the nodules or  septation. Homogeneous anechoic appearance otherwise.  Left ovary  Not visualized. No adnexal masses seen on the left.  Other findings  No free fluid.  IMPRESSION:  Multiple uterine fibroids. 6.5 cm cyst with thick septation and  nonvascular mural nodules in the right ovary. This lesion is  indeterminate and follow-up MRI or surgical evaluation should be  considered. This recommendation follows the consensus statement:  Management of Asymptomatic Ovarian and Other Adnexal Cysts Imaged at  [place]: Society of Radiologists in [place]. Radiology 2010; [phone].   Assessment:     Chart reveals LEEP in 2002 with + endocervical margins. No f/u since that time.  I spoke to pt and told her of my concern that she may have cervical cancer.  I stressed with her the importance of follow/up.  Pt confirms that she will f/u for care.   Will obtain CT of the pelvis to eval the cervix and lymph nodes as well as the ovarian mass   Plan:     F/u PAP and cx F/u 2 weeks CT scan of pelvis BMP today

## 2014-04-01 NOTE — Progress Notes (Signed)
Pt has spotty brown discharge everyday, sometimes it is red.  Pt does not have periods.

## 2014-04-05 LAB — CYTOLOGY - PAP

## 2014-04-06 ENCOUNTER — Encounter (INDEPENDENT_AMBULATORY_CARE_PROVIDER_SITE_OTHER): Payer: Self-pay

## 2014-04-06 ENCOUNTER — Ambulatory Visit (HOSPITAL_COMMUNITY)
Admission: RE | Admit: 2014-04-06 | Discharge: 2014-04-06 | Disposition: A | Discharge status: Home / Self Care | Payer: Self-pay | Source: Ambulatory Visit | Attending: Obstetrics & Gynecology | Admitting: Obstetrics & Gynecology

## 2014-04-06 ENCOUNTER — Encounter (HOSPITAL_COMMUNITY): Payer: Self-pay

## 2014-04-06 DIAGNOSIS — Z780 Asymptomatic menopausal state: Secondary | ICD-10-CM | POA: Insufficient documentation

## 2014-04-06 DIAGNOSIS — D259 Leiomyoma of uterus, unspecified: Secondary | ICD-10-CM | POA: Insufficient documentation

## 2014-04-06 DIAGNOSIS — N9489 Other specified conditions associated with female genital organs and menstrual cycle: Secondary | ICD-10-CM | POA: Insufficient documentation

## 2014-04-06 DIAGNOSIS — N8320 Unspecified ovarian cysts: Secondary | ICD-10-CM | POA: Insufficient documentation

## 2014-04-06 DIAGNOSIS — R1900 Intra-abdominal and pelvic swelling, mass and lump, unspecified site: Secondary | ICD-10-CM

## 2014-04-06 HISTORY — DX: Essential (primary) hypertension: I10

## 2014-04-06 MED ORDER — IOHEXOL 300 MG/ML  SOLN
100.0000 mL | Freq: Once | INTRAMUSCULAR | Status: AC | PRN
  Administered 2014-04-06: 100 mL via INTRAVENOUS

## 2014-04-07 ENCOUNTER — Telehealth: Payer: Self-pay | Admitting: Obstetrics & Gynecology

## 2014-04-07 NOTE — Telephone Encounter (Signed)
TC to pt.  LMTCB.  clh-S

## 2014-04-07 NOTE — Telephone Encounter (Signed)
TC to pt to discuss abnormal cytology and CT.  Need to refer to [place].  Left message for pt to call me back.

## 2014-04-08 ENCOUNTER — Telehealth: Payer: Self-pay | Admitting: *Deleted

## 2014-04-08 ENCOUNTER — Telehealth: Payer: Self-pay | Admitting: Obstetrics & Gynecology

## 2014-04-08 NOTE — Telephone Encounter (Signed)
Telephone call to pt to review results of PAP.  Pt was told on her visit that I was very concerned re her PAP and cervical exam especially given the h/o a LEEP with positive margins.  I told her that the PAP had cells suspicious for invasive disease and that CT shows a cervical mass.  She reports that she understands and agrees with the need for prompt eval by a GYN ONC.  She is aware that she will need further biopsies for confirmation.  She reports that she will f/u with the appt with GYN ONC and prefers to be seen by them on Monday.     She reports a desire to 'get to the bottom of what's been going on and get treated.'  Msg forwarded to Joylene John to try to change appt from the 15th to the 12th.  Pt was also notified that she no longer needs to see me on the 14th. All questions answered.

## 2014-04-08 NOTE — Telephone Encounter (Signed)
Martha Koch called and left a message stating Dr. Ihor Dow has been trying to reach her and she is leaving her work number where she can be reached. Her work number is  [phone] extension [phone]. Called Dr. Maylene RoesTamala Julian to notify her and will forward this message to her.

## 2014-04-08 NOTE — Telephone Encounter (Signed)
Opened in error

## 2014-04-12 ENCOUNTER — Ambulatory Visit: Payer: Self-pay | Attending: Gynecologic Oncology | Admitting: Gynecologic Oncology

## 2014-04-12 VITALS — BP 151/85 | HR 75 | Temp 98.0°F | Resp 22 | Ht 67.0 in | Wt 128.1 lb

## 2014-04-12 DIAGNOSIS — I10 Essential (primary) hypertension: Secondary | ICD-10-CM | POA: Insufficient documentation

## 2014-04-12 DIAGNOSIS — C530 Malignant neoplasm of endocervix: Secondary | ICD-10-CM

## 2014-04-12 DIAGNOSIS — C539 Malignant neoplasm of cervix uteri, unspecified: Secondary | ICD-10-CM | POA: Insufficient documentation

## 2014-04-12 DIAGNOSIS — Z79899 Other long term (current) drug therapy: Secondary | ICD-10-CM | POA: Insufficient documentation

## 2014-04-12 DIAGNOSIS — N888 Other specified noninflammatory disorders of cervix uteri: Secondary | ICD-10-CM

## 2014-04-12 DIAGNOSIS — F17210 Nicotine dependence, cigarettes, uncomplicated: Secondary | ICD-10-CM | POA: Insufficient documentation

## 2014-04-12 DIAGNOSIS — Z808 Family history of malignant neoplasm of other organs or systems: Secondary | ICD-10-CM | POA: Insufficient documentation

## 2014-04-12 DIAGNOSIS — Z803 Family history of malignant neoplasm of breast: Secondary | ICD-10-CM | POA: Insufficient documentation

## 2014-04-12 DIAGNOSIS — N838 Other noninflammatory disorders of ovary, fallopian tube and broad ligament: Secondary | ICD-10-CM

## 2014-04-12 NOTE — Progress Notes (Signed)
Consult Note: Gyn-Onc  Consult was requested by Dr. Ihor Dow for the evaluation of Martha Koch 58 y.o. female   CC:  Pelvic mass, Vaginal bleeding.  Assessment/Plan:  [name]  is a 58 y.o.  With an abnormal pap suggesting invasive disease.  Cervical biopsy collected today to confirm the diagnosis.  On PE there is no parametrial disease.    A pelvic mass is also present.  If the bx returns with invasive disease recommended RA Lsc Rad Hso BSO PPLND.  If the bx is not confirmatory will need to do frozen LEEP before we can proceed.  If the ovarian lesion represents metastatic disease then the radical hysterectomy, LND  will be aborted.  [name] was also given the option of chemoradiation after removal of the adnexal mass.  She will consider the options and inform us of her choice  on Wednesday.  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   HPI: [name]  is a 58 y.o.  Who presented to the ED with c/o dizziness and a pelvic ultrasound 02/23/2014  Uterus  Measurements: 9.3 x 6.2 x 9.4 cm. Heterogeneous nodular enlargement  of the uterus consistent with multiple fibroids. The largest measuring 2.7 cm maximal diameter. Endometrium Thickness: 5.8 mm. Limited visualization due to displacement and distortion because of the uterine fibroids. Right ovary Measurements: 8.1 x 7 x 7.6 cm. Large cyst in the right ovary measures 5.7 x 6.4 x 6.5 cm. Single thickened septation with mild peripheral nodularity. No evidence of flow in the nodules or  septation. Homogeneous anechoic appearance otherwise.  Left ovary  Not visualized. No adnexal masses seen on the left.  Other findings  No free fluid. 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    04/06/2014 CT  Vascular/Lymphatic: No pathologically enlarged lymph nodes identified. No other significant abnormality identified. Reproductive: Several uterine myometrial masses are seen which measure up to 4 cm and are most consistent with fibroids. Another mass is seen in the region of the cervix which measures 2.9 x 3.9 cm on image 80, and this is suspicious for cervical carcinoma. A complex cystic lesion containing thin internal septations is seen in the right adnexa and pelvic cul-de-sac which measures 5.8 x 7.6 cm. Although no definite malignant characteristics are seen, this is suspicious for a benign or low malignant potential cystic ovarian neoplasm in a postmenopausal female.   IMPRESSION:  3.9 cm mass in the cervix, suspicious for cervical carcinoma. Other  uterine myometrial masses are seen measuring up to 4 cm, most likely representing fibroids.  7.6 cm complex cystic lesion in right adnexa and cul-de-sac, suspicious for a benign or lobe malignant potential cystic ovarian neoplasm.   Her history is notable for an abnormal pap 2002.    Cervical LEEP 2/20002 Path: MILD AND MODERATE SQUAMOUS DYSPLASIA, CIN-II AND CIN-III. DYSPLASIA INVOLVES THE ENDOCERVICAL MARGIN IN ALL FOUR QUADRANTS. ECTOCERVICAL MARGIN NOT INVOLVED.    [name] reports 1 year of vaginal discharge/spotting and a 20 pound weight loss over the same interval.  She denies nausea or vomiting, no changes in her bowel or bladder habits.    Review of Systems:  Constitutional  Feels well, 20 pound weight loss over the past year. Cardiovascular  No chest pain, shortness of  breath, or edema  Pulmonary  No cough or wheeze.  Gastro Intestinal  No nausea, vomitting, or diarrhoea. No bright red blood per rectum, no abdominal pain, change in bowel movement, or constipation.  Genito Urinary  No frequency, urgency, dysuria, one year of vaginal discharge/spotting. Musculo Skeletal  No myalgia, arthralgia, joint swelling or pain   Neurologic  No weakness, numbness, change in gait,  Psychology  No depression, anxiety, insomnia.    Current Meds:  Outpatient Encounter Prescriptions as of 04/12/2014  Medication Sig  . amLODipine (NORVASC) 10 MG tablet Take 1 tablet (10 mg total) by mouth daily.  Marland Kitchen atorvastatin (LIPITOR) 20 MG tablet Take 1 tablet (20 mg total) by mouth daily.  Marland Kitchen ibuprofen (ADVIL,MOTRIN) 200 MG tablet Take 200 mg by mouth every 6 (six) hours as needed.  . Multiple Vitamin (MULTIVITAMIN WITH MINERALS) TABS tablet Take 1 tablet by mouth daily.    Allergy:  Allergies  Allergen Reactions  . Aspirin     REACTION: stomach  . Penicillins     REACTION: hives    Social Hx:   History   Social History  . Marital Status: Single    Spouse Name: N/A    Number of Children: 3  . Years of Education: 12   Occupational History  . [employer]     Social History Main Topics  . Smoking status: Current Every Day Smoker -- 0.50 packs/day for 42 years    Types: Cigarettes  . Smokeless tobacco: Never Used  . Alcohol Use: No  . Drug Use: No  . Sexual Activity: No   Other Topics Concern  . Not on file   Social History Narrative   Has 3 children. They live in [place].   Has 3 grandchildren.    Uvaldo Rising (oldest daughter)    Lives alone.     Past Surgical Hx: No past surgical history on file.  BTL    Past Medical Hx:  Past Medical History  Diagnosis Date  . Hypertension     Past Gynecological History:  G3P3 Menarche 89, regular menses until 58 y/o.   No LMP recorded. Patient is postmenopausal. H/o abnormal pap treated with LEEP in 2002.  Family Hx:  Family History  Problem Relation Age of Onset  . Cancer Other   . Diabetes Other   . Hypertension Father   . Cancer Maternal Aunt     breast cancer, x 4 aunts     Vitals:  Blood pressure 151/85, pulse 75, temperature 98 F (36.7 C), temperature source Oral, resp. rate 22, height 5\' 7"  (1.702 m), weight 128 lb 1.6 oz  (58.106 kg).  Physical Exam: WD in NAD Neck  Supple NROM, without any enlargements.  Lymph Node Survey No cervical supraclavicular or inguinal adenopathy Cardiovascular  Pulse normal rate, regularity and rhythm. S1 and S2 normal.  Lungs  Clear to auscultation bilaterally, without wheezes/crackles/rhonchi. Skin  No rash/lesions/breakdown  Psychiatry  Alert and oriented appropriate mood affect speech and reasoning. Abdomen  Normoactive bowel sounds, abdomen soft, non-tender.  Umbilical incision.   Back No CVA tenderness Genito Urinary  Vulva/vagina: Normal external female genitalia.  No lesions.   Bladder/urethra:  No lesions or masses  Vagina: trace vaginal discharge Cervix: approximately 4cm.  There is a 3cm lesion on the exocervix extending down the posterior aspect of the cervix without involvement of the vagina.  Uterus: Mobile, no parametrial involvement or nodularity.  Adnexa: central smooth  palpable masses. No cul de sac nodularity  Rectal  Good tone, no masses no cul de sac nodularity.  Extremities  No bilateral cyanosis, clubbing or edema.  CERVICAL BIOPSY  Procedure explained to patient.  The speculum was placed and the cervix was prepped with betadine.   Three biopsies were taken from 6-9 o'clock  area. There was small bleeding controlled with monsels. Patient tolerated the procedure well and all instruments were removed.   [name], MD, PhD 04/12/2014, 3:55 PM

## 2014-04-12 NOTE — Patient Instructions (Signed)
We will call you with the results of your biopsy.  We will arrange a pre-op appointment at [facility] on Oct 16 for a Radical hysterectomy, removal of the the tubes and ovaries, and lymph nodes.  You will be scheduled for surgery on Oct 21.

## 2014-04-13 ENCOUNTER — Telehealth: Payer: Self-pay | Admitting: Gynecologic Oncology

## 2014-04-13 NOTE — Telephone Encounter (Signed)
Patient informed of biopsy results.  Verbalizing understanding and leaning towards radical hysterectomy for treatment instead of chemo/rads.  Informed that she would be contacted from [facility] about her upcoming surgery with Dr. Skeet Latch on Oct 21. Advised to call the office for any questions or concerns.

## 2014-04-14 ENCOUNTER — Ambulatory Visit: Payer: Self-pay | Admitting: Obstetrics & Gynecology

## 2014-04-15 ENCOUNTER — Ambulatory Visit: Payer: Self-pay | Admitting: Gynecologic Oncology

## 2014-04-19 ENCOUNTER — Encounter: Payer: Self-pay | Admitting: Gynecologic Oncology

## 2014-04-21 HISTORY — PX: OTHER SURGICAL HISTORY: SHX169

## 2014-04-26 ENCOUNTER — Telehealth: Payer: Self-pay | Admitting: *Deleted

## 2014-04-26 NOTE — Telephone Encounter (Signed)
Pt called stating her catheter "is irritating" and inquiring if she could have it removed earlier than appt on 04/29/14. Told patient I would speak with Joylene John, NP and give her a call back. Per Joylene John, NP the catheter needs to stay in until her scheduled appt on 04/29/14. If taken out early, it could possibly have to go back in. Called patient and let her know this and pt was agreeable to keep it in until appt. Verified with pt that the catheter bag was strapped to her leg as to avoid any pulling or additional irritation. Pt states it is.

## 2014-04-27 ENCOUNTER — Emergency Department (HOSPITAL_COMMUNITY)
Admission: EM | Admit: 2014-04-27 | Discharge: 2014-04-27 | Disposition: A | Discharge status: Home / Self Care | Payer: Self-pay | Attending: Emergency Medicine | Admitting: Emergency Medicine

## 2014-04-27 ENCOUNTER — Encounter (HOSPITAL_COMMUNITY): Payer: Self-pay | Admitting: Emergency Medicine

## 2014-04-27 DIAGNOSIS — Z90710 Acquired absence of both cervix and uterus: Secondary | ICD-10-CM | POA: Insufficient documentation

## 2014-04-27 DIAGNOSIS — I10 Essential (primary) hypertension: Secondary | ICD-10-CM | POA: Insufficient documentation

## 2014-04-27 DIAGNOSIS — Z9889 Other specified postprocedural states: Secondary | ICD-10-CM

## 2014-04-27 DIAGNOSIS — T83711A Erosion of implanted vaginal mesh and other prosthetic materials to surrounding organ or tissue, initial encounter: Secondary | ICD-10-CM | POA: Insufficient documentation

## 2014-04-27 DIAGNOSIS — Z880 Allergy status to penicillin: Secondary | ICD-10-CM | POA: Insufficient documentation

## 2014-04-27 DIAGNOSIS — Z87891 Personal history of nicotine dependence: Secondary | ICD-10-CM | POA: Insufficient documentation

## 2014-04-27 DIAGNOSIS — Z859 Personal history of malignant neoplasm, unspecified: Secondary | ICD-10-CM | POA: Insufficient documentation

## 2014-04-27 DIAGNOSIS — N898 Other specified noninflammatory disorders of vagina: Secondary | ICD-10-CM

## 2014-04-27 DIAGNOSIS — Z79899 Other long term (current) drug therapy: Secondary | ICD-10-CM | POA: Insufficient documentation

## 2014-04-27 HISTORY — DX: Malignant (primary) neoplasm, unspecified: C80.1

## 2014-04-27 NOTE — ED Notes (Signed)
Pt had radical hysterectomy 1 week ago and had urinary catheter placed (urine clear/yellow in drainage bag). Pt says that she started having some bright red bleeding today.

## 2014-04-27 NOTE — ED Provider Notes (Signed)
CSN: [id number]     Arrival date & time 04/27/14  1826 History   First MD Initiated Contact with Patient 04/27/14 2138     Chief Complaint  Patient presents with  . Vaginal Bleeding     (Consider location/radiation/quality/duration/timing/severity/associated sxs/prior Treatment) HPI The patient is post hysterectomy 1 week ago. She has a urinary catheter in place. The catheter has been draining clear yellow urine. She reports however today she noticed some red blood that was coming out around it and was on her underwear. After her hysterectomy she did not have any significant amount of bleeding. Concern today is seeing the blood. She reports her pain is controlled and she is not suffering from any additional abdominal pain. She has not had fever or vomiting. She reports aside from noticing the blood which made her anxious she has otherwise been doing well.  Past Medical History  Diagnosis Date  . Hypertension   . Cancer cervical    Past Surgical History  Procedure Laterality Date  . Abdominal hysterectomy     Family History  Problem Relation Age of Onset  . Cancer Other   . Diabetes Other   . Hypertension Father   . Cancer Maternal Aunt     breast cancer, x 4 aunts    History  Substance Use Topics  . Smoking status: Former Smoker -- 0.50 packs/day for 42 years    Types: Cigarettes  . Smokeless tobacco: Never Used  . Alcohol Use: No   OB History   Grav Para Term Preterm Abortions TAB SAB Ect Mult Living                 Review of Systems  10 Systems reviewed and are negative for acute change except as noted in the HPI.   Allergies  Aspirin and Penicillins  Home Medications   Prior to Admission medications   Medication Sig Start Date End Date Taking? Authorizing Provider  amLODipine (NORVASC) 10 MG tablet Take 1 tablet (10 mg total) by mouth daily. 03/30/14  Yes Josalyn C Funches, MD  atorvastatin (LIPITOR) 20 MG tablet Take 1 tablet (20 mg total) by mouth daily.  03/30/14  Yes Josalyn C Funches, MD  ibuprofen (ADVIL,MOTRIN) 200 MG tablet Take 200 mg by mouth every 6 (six) hours as needed for moderate pain (pain).    Yes Historical Provider, MD  Multiple Vitamin (MULTIVITAMIN WITH MINERALS) TABS tablet Take 1 tablet by mouth daily.   Yes Historical Provider, MD  oxycodone (OXY-IR) 5 MG capsule Take 5 mg by mouth every 4 (four) hours as needed for pain.   Yes Historical Provider, MD   BP 122/69  Pulse 87  Temp(Src) 98 F (36.7 C) (Oral)  Resp 18  SpO2 100% Physical Exam  Constitutional: She is oriented to person, place, and time. She appears well-developed and well-nourished. No distress.  HENT:  Head: Normocephalic and atraumatic.  Eyes: EOM are normal.  Pulmonary/Chest: Effort normal. No respiratory distress.  Abdominal: Soft. Bowel sounds are normal. She exhibits no distension and no mass. There is no tenderness. There is no rebound and no guarding.  Patient has laparoscopy incisions that are healing well. No drainage or discharge. Her abdomen is soft. She does not endorse tenderness to palpation.  Genitourinary:  Examination of the external female genitalia shows a catheter exiting the urethra with out any drainage or discharge around it. The general condition the urethra is normal. At the vaginal fourchette there is a small few millimeter erosion that develops  a very small blush of red blood when swabbed. There is no clot or active bleeding from the introitus. The urine in the catheter bag is clear without any clot or blood tinging  Musculoskeletal: Normal range of motion. She exhibits no edema.  Neurological: She is alert and oriented to person, place, and time.  Skin: Skin is warm and dry.  Psychiatric: She has a normal mood and affect.    ED Course  Procedures (including critical care time) Labs Review Labs Reviewed - No data to display  Imaging Review No results found.   EKG Interpretation None      MDM   Final diagnoses:   Erosion of vagina  Post-operative state   At this point and the patient's bleeding appears to be from a very small erosion at the vaginal posterior fourchette from the Foley catheter abrading. There is no significant amount of blood or clot present within the vaginal introitus. There is no bleeding around the actual exodus of the Foley catheter. Findings are consistent with a minor mucosal erosion from the catheter. At this point I do feel she is safe for continued outpatient management. She has a follow-up appointment with her surgeon on Thursday. She is counseled to reposition the catheter and to make sure it is not drawn too tightly.    Charlesetta Shanks, MD 04/27/14 2215

## 2014-04-27 NOTE — ED Notes (Addendum)
Pt reporting vaginal bleeding that started today. No change in pain, 16 F foley catheter in place with clear yellow drainage. Small amount of vaginal bleeding noted and small abrasion noted to vaginal opening.  MD at bedside

## 2014-04-27 NOTE — ED Notes (Signed)
Pt ambulatory upon dc/ She reports she is leaving with ALL belongings she arrived with.

## 2014-04-27 NOTE — Discharge Instructions (Signed)
Foley Catheter Care °A Foley catheter is a soft, flexible tube that is placed into the bladder to drain urine. A Foley catheter may be inserted if: °· You leak urine or are not able to control when you urinate (urinary incontinence). °· You are not able to urinate when you need to (urinary retention). °· You had prostate surgery or surgery on the genitals. °· You have certain medical conditions, such as multiple sclerosis, dementia, or a spinal cord injury. °If you are going home with a Foley catheter in place, follow the instructions below. °TAKING CARE OF THE CATHETER °1. Wash your hands with soap and water. °2. Using mild soap and warm water on a clean washcloth: °· Clean the area on your body closest to the catheter insertion site using a circular motion, moving away from the catheter. Never wipe toward the catheter because this could sweep bacteria up into the urethra and cause infection. °· Remove all traces of soap. Pat the area dry with a clean towel. For males, reposition the foreskin. °3. Attach the catheter to your leg so there is no tension on the catheter. Use adhesive tape or a leg strap. If you are using adhesive tape, remove any sticky residue left behind by the previous tape you used. °4. Keep the drainage bag below the level of the bladder, but keep it off the floor. °5. Check throughout the day to be sure the catheter is working and urine is draining freely. Make sure the tubing does not become kinked. °6. Do not pull on the catheter or try to remove it. Pulling could damage internal tissues. °TAKING CARE OF THE DRAINAGE BAGS °You will be given two drainage bags to take home. One is a large overnight drainage bag, and the other is a smaller leg bag that fits underneath clothing. You may wear the overnight bag at any time, but you should never wear the smaller leg bag at night. Follow the instructions below for how to empty, change, and clean your drainage bags. °Emptying the Drainage Bag °You must  empty your drainage bag when it is  -½ full or at least 2-3 times a day. °1. Wash your hands with soap and water. °2. Keep the drainage bag below your hips, below the level of your bladder. This stops urine from going back into the tubing and into your bladder. °3. Hold the dirty bag over the toilet or a clean container. °4. Open the pour spout at the bottom of the bag and empty the urine into the toilet or container. Do not let the pour spout touch the toilet, container, or any other surface. Doing so can place bacteria on the bag, which can cause an infection. °5. Clean the pour spout with a gauze pad or cotton ball that has rubbing alcohol on it. °6. Close the pour spout. °7. Attach the bag to your leg with adhesive tape or a leg strap. °8. Wash your hands well. °Changing the Drainage Bag °Change your drainage bag once a month or sooner if it starts to smell bad or look dirty. Below are steps to follow when changing the drainage bag. °1. Wash your hands with soap and water. °2. Pinch off the rubber catheter so that urine does not spill out. °3. Disconnect the catheter tube from the drainage tube at the connection valve. Do not let the tubes touch any surface. °4. Clean the end of the catheter tube with an alcohol wipe. Use a different alcohol wipe to clean the   end of the drainage tube. °5. Connect the catheter tube to the drainage tube of the clean drainage bag. °6. Attach the new bag to the leg with adhesive tape or a leg strap. Avoid attaching the new bag too tightly. °7. Wash your hands well. °Cleaning the Drainage Bag °1. Wash your hands with soap and water. °2. Wash the bag in warm, soapy water. °3. Rinse the bag thoroughly with warm water. °4. Fill the bag with a solution of white vinegar and water (1 cup vinegar to 1 qt warm water [.2 L vinegar to 1 L warm water]). Close the bag and soak it for 30 minutes in the solution. °5. Rinse the bag with warm water. °6. Hang the bag to dry with the pour spout open  and hanging downward. °7. Store the clean bag (once it is dry) in a clean plastic bag. °8. Wash your hands well. °PREVENTING INFECTION °· Wash your hands before and after handling your catheter. °· Take showers daily and wash the area where the catheter enters your body. Do not take baths. Replace wet leg straps with dry ones, if this applies. °· Do not use powders, sprays, or lotions on the genital area. Only use creams, lotions, or ointments as directed by your caregiver. °· For females, wipe from front to back after each bowel movement. °· Drink enough fluids to keep your urine clear or pale yellow unless you have a fluid restriction. °· Do not let the drainage bag or tubing touch or lie on the floor. °· Wear cotton underwear to absorb moisture and to keep your skin drier. °SEEK MEDICAL CARE IF:  °· Your urine is cloudy or smells unusually bad. °· Your catheter becomes clogged. °· You are not draining urine into the bag or your bladder feels full. °· Your catheter starts to leak. °SEEK IMMEDIATE MEDICAL CARE IF:  °· You have pain, swelling, redness, or pus where the catheter enters the body. °· You have pain in the abdomen, legs, lower back, or bladder. °· You have a fever. °· You see blood fill the catheter, or your urine is pink or red. °· You have nausea, vomiting, or chills. °· Your catheter gets pulled out. °MAKE SURE YOU:  °· Understand these instructions. °· Will watch your condition. °· Will get help right away if you are not doing well or get worse. °Document Released: 06/18/2005 Document Revised: 11/02/2013 Document Reviewed: 06/09/2012 °[employer]® Patient Information ©2015 [employer]. This information is not intended to replace advice given to you by your health care provider. Make sure you discuss any questions you have with your health care provider. ° °

## 2014-04-29 ENCOUNTER — Encounter: Payer: Self-pay | Admitting: Gynecologic Oncology

## 2014-04-29 ENCOUNTER — Ambulatory Visit: Payer: Self-pay | Attending: Gynecologic Oncology | Admitting: Gynecologic Oncology

## 2014-04-29 DIAGNOSIS — Z9079 Acquired absence of other genital organ(s): Secondary | ICD-10-CM | POA: Insufficient documentation

## 2014-04-29 DIAGNOSIS — Z90722 Acquired absence of ovaries, bilateral: Secondary | ICD-10-CM | POA: Insufficient documentation

## 2014-04-29 DIAGNOSIS — D270 Benign neoplasm of right ovary: Secondary | ICD-10-CM | POA: Insufficient documentation

## 2014-04-29 DIAGNOSIS — C539 Malignant neoplasm of cervix uteri, unspecified: Secondary | ICD-10-CM | POA: Insufficient documentation

## 2014-04-29 DIAGNOSIS — Z87891 Personal history of nicotine dependence: Secondary | ICD-10-CM | POA: Insufficient documentation

## 2014-04-29 DIAGNOSIS — Z90710 Acquired absence of both cervix and uterus: Secondary | ICD-10-CM | POA: Insufficient documentation

## 2014-04-29 MED ORDER — OXYCODONE HCL 5 MG PO CAPS: 5.0000 mg | ORAL_CAPSULE | ORAL | Status: DC | PRN

## 2014-04-29 NOTE — Patient Instructions (Addendum)
We will arrange appointments with Dr. Marko Plume and Dr. Sondra Come.  Please call for any questions or concerns.  PET scan scheduled for Nov. 3 at 10 am.

## 2014-04-29 NOTE — Progress Notes (Signed)
POSTOPERATIVE FOLLOWUP Chief complaint: voiding trial, postop evaluation, cervical cancer  HPI:  Martha Koch is a 58 y.o. 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  She then underwent a robotic radical hysterectomy, BSO, and pelvic lymphadenectomy on 02/13/47 without complications.  Her postoperative course was uncomplicated.  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 Negative nodes. The primary tumor was a moderately to poorly differentiated tumor extending from the cervix into the lower uterine segment (6.9cm largest dimension in endometrium, 3.6cm largest dimension in the cervix). The bilateral parametrium was positive for involvement (however margins were negative). The anterior deep cervical stromal margin was close (27mm) but negative. There was LVSI present as well as perineural involvement.  She is seen today for a postoperative check and to discuss her pathology results and ongoing plan.  Since discharge from the hospital, she is feeling well.  She has improving appetite, normal bowel and bladder function, and pain controlled with minimal PO medication. She has no other complaints today.  Her foley was removed in clinic today.  Review of systems: Constitutional:  She has no weight gain or weight loss. She has no fever or chills. Eyes: No blurred vision Ears, Nose, Mouth, Throat: No dizziness, headaches or changes in hearing. No mouth sores. Cardiovascular: No chest pain, palpitations or edema. Respiratory:  No shortness of breath, wheezing or cough Gastrointestinal: She has normal bowel movements without diarrhea or constipation. She denies any nausea or vomiting. She denies blood in her stool or heart burn. Genitourinary:  She denies pelvic pain, pelvic pressure or changes in her urinary function. She has no hematuria, dysuria, or incontinence. She has no  irregular vaginal bleeding or vaginal discharge Musculoskeletal: Denies muscle weakness or joint pains.  Skin:  She has no skin changes, rashes or itching Neurological:  Denies dizziness or headaches. No neuropathy, no numbness or tingling. Psychiatric:  She denies depression or anxiety. Hematologic/Lymphatic:   No easy bruising or bleeding  Outpatient Encounter Prescriptions as of 04/29/2014  Medication Sig  . amLODipine (NORVASC) 10 MG tablet Take 1 tablet (10 mg total) by mouth daily.  Marland Kitchen atorvastatin (LIPITOR) 20 MG tablet Take 1 tablet (20 mg total) by mouth daily.  Marland Kitchen ibuprofen (ADVIL,MOTRIN) 200 MG tablet Take 200 mg by mouth every 6 (six) hours as needed for moderate pain (pain).   . Multiple Vitamin (MULTIVITAMIN WITH MINERALS) TABS tablet Take 1 tablet by mouth daily.  Marland Kitchen oxycodone (OXY-IR) 5 MG capsule Take 1 capsule (5 mg total) by mouth every 4 (four) hours as needed for pain.  . [DISCONTINUED] oxycodone (OXY-IR) 5 MG capsule Take 5 mg by mouth every 4 (four) hours as needed for pain.   Allergies  Allergen Reactions  . Aspirin     REACTION: stomach  . Penicillins     REACTION: hives    Past Medical History  Diagnosis Date  . Hypertension   . Cancer cervical     Past Surgical History  Procedure Laterality Date  . Abdominal hysterectomy    . Robotic radical hysterectomy, bso, pelvic lymphadenectomy Bilateral 04/21/14    Family History  Problem Relation Age of Onset  . Cancer Other   . Diabetes Other   . Hypertension Father   . Cancer Maternal Aunt     breast cancer, x 4 aunts     History   Social History  . Marital Status: Single  Spouse Name: N/A    Number of Children: 3  . Years of Education: 12   Occupational History  . [employer]     Social History Main Topics  . Smoking status: Former Smoker -- 0.50 packs/day for 42 years    Types: Cigarettes  . Smokeless tobacco: Never Used  . Alcohol Use: No  . Drug Use: No  . Sexual Activity:  No   Other Topics Concern  . None   Social History Narrative   Has 3 children. They live in [place].   Has 3 grandchildren.    Uvaldo Rising (oldest daughter)    Lives alone.     Physical Exam: Vitals: none taken General: Well dressed, well nourished in no apparent distress.  Cachectic. HEENT:  Normocephalic and atraumatic, no lesions.  Extraocular muscles intact. Sclerae anicteric. Pupils equal, round, reactive. No mouth sores or ulcers. Thyroid is normal size, not nodular, midline. Skin:  No lesions or rashes. Breasts:  Soft, symmetric.  No skin or nipple changes.  No palpable LN or masses. Lungs:  Clear to auscultation bilaterally.  No wheezes. Cardiovascular:  Regular rate and rhythm.  No murmurs or rubs. Abdomen:  Soft, nontender, nondistended.  No palpable masses.  No hepatosplenomegaly.  No ascites. Normal bowel sounds.  No hernias.  Incisions are healing well Genitourinary: deferred Extremities: No cyanosis, clubbing or edema.  No calf tenderness or erythema. No palpable cords. Psychiatric: Mood and affect are appropriate. Neurological: Awake, alert and oriented x 3. Sensation is intact, no neuropathy.  Musculoskeletal: No pain, normal strength and range of motion.  Voiding trial: the patient's foley was instilled with 200cc of sterile saline prior to its removal. It was removed and the patient was asked to void. She unfortunately missed the hat in the toilet and could not capture all of her urine. Approximately 150cc was retrieved. She felt an urge to micturate and she felt a strong flow of urine and felt completely voided. Due to apparent normal voiding function, the foley was left out.  Assessment:    58 y.o. 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   S/p robotic radical hysterectomy with BSO and pelvic lymphadenectomy on 04/21/14.   Plan: 1) Pathology reports reviewed today 2) Treatment counseling - The patient was  informed regarding her pathology findings and explained regarding the necessity of adjuvant therapy with pelvic and vaginal radiation and chemosensitizing cisplatin chemotherapy as she meets high risk factors for recurrence. We reviewed the likely radiation and chemotherapy regimen. We should plan on commencing these therapies at approximately 4-6 weeks postop.  I have ordered a PET scan to evaluate for occult distant metastases. She was given the opportunity to ask questions, which were answered to her satisfaction, and she is agreement with the above mentioned plan of care. We have appointments scheduled with Dr Sondra Come and Dr Marko Plume.  3)  Return to clinic in 2 weeks for further evaluation and discussion with Dr Skeet Latch.   Donaciano Eva, MD

## 2014-04-30 ENCOUNTER — Encounter (HOSPITAL_COMMUNITY): Payer: Self-pay | Admitting: Emergency Medicine

## 2014-04-30 ENCOUNTER — Emergency Department (HOSPITAL_COMMUNITY)
Admission: EM | Admit: 2014-04-30 | Discharge: 2014-04-30 | Disposition: A | Discharge status: Home / Self Care | Payer: Self-pay | Attending: Emergency Medicine | Admitting: Emergency Medicine

## 2014-04-30 ENCOUNTER — Emergency Department (HOSPITAL_COMMUNITY): Payer: Self-pay

## 2014-04-30 ENCOUNTER — Other Ambulatory Visit: Payer: Self-pay | Admitting: *Deleted

## 2014-04-30 DIAGNOSIS — K5900 Constipation, unspecified: Secondary | ICD-10-CM | POA: Insufficient documentation

## 2014-04-30 DIAGNOSIS — I10 Essential (primary) hypertension: Secondary | ICD-10-CM | POA: Insufficient documentation

## 2014-04-30 DIAGNOSIS — R109 Unspecified abdominal pain: Secondary | ICD-10-CM

## 2014-04-30 DIAGNOSIS — Z79899 Other long term (current) drug therapy: Secondary | ICD-10-CM | POA: Insufficient documentation

## 2014-04-30 DIAGNOSIS — Z880 Allergy status to penicillin: Secondary | ICD-10-CM | POA: Insufficient documentation

## 2014-04-30 DIAGNOSIS — K5903 Drug induced constipation: Secondary | ICD-10-CM

## 2014-04-30 DIAGNOSIS — Z8542 Personal history of malignant neoplasm of other parts of uterus: Secondary | ICD-10-CM | POA: Insufficient documentation

## 2014-04-30 DIAGNOSIS — Z87891 Personal history of nicotine dependence: Secondary | ICD-10-CM | POA: Insufficient documentation

## 2014-04-30 DIAGNOSIS — Z90710 Acquired absence of both cervix and uterus: Secondary | ICD-10-CM | POA: Insufficient documentation

## 2014-04-30 MED ORDER — POLYETHYLENE GLYCOL 3350 17 G PO PACK: 17.0000 g | PACK | Freq: Every day | ORAL | Status: DC

## 2014-04-30 NOTE — ED Notes (Signed)
Patient moaning, complains of abdominal pain as well as back pain.  She occasionally laughs aloud and says things that don't match the situation.  Her daughter and granddaughter at bedside report that this is new.

## 2014-04-30 NOTE — ED Notes (Signed)
Patient ambulated to restroom but was unable to provide a urine specimen.  Dr. Sharol Given made aware.

## 2014-04-30 NOTE — ED Provider Notes (Signed)
CSN: [id number]     Arrival date & time 04/30/14  0044 History   First MD Initiated Contact with Patient 04/30/14 0114     Chief Complaint  Patient presents with  . Abdominal Pain  . Constipation     (Consider location/radiation/quality/duration/timing/severity/associated sxs/prior Treatment) HPI 58 year old female presents to the emergency department with complaint of constipation and abdominal pain.  Patient has not had a bowel movement since Tuesday.  She was seen in the office today and had her Foley out.  It was recommended that she use a fleets enema, but only tolerated about one third of the bottle.  Daughter gave her a diet T, which seemed to have caused a lot of abdominal cramping.  Patient is status post a hysterectomy a week ago.  She is still taking pain medication.  She has never had problems with constipation prior to this time.  Patient is to follow-up next week with gynecology oncology further treatment of her uterine cancer. Past Medical History  Diagnosis Date  . Hypertension   . Cancer cervical    Past Surgical History  Procedure Laterality Date  . Abdominal hysterectomy    . Robotic radical hysterectomy, bso, pelvic lymphadenectomy Bilateral 04/21/14   Family History  Problem Relation Age of Onset  . Cancer Other   . Diabetes Other   . Hypertension Father   . Cancer Maternal Aunt     breast cancer, x 4 aunts    History  Substance Use Topics  . Smoking status: Former Smoker -- 0.50 packs/day for 42 years    Types: Cigarettes  . Smokeless tobacco: Never Used  . Alcohol Use: No   OB History   Grav Para Term Preterm Abortions TAB SAB Ect Mult Living                 Review of Systems   See History of Present Illness; otherwise all other systems are reviewed and negative  Allergies  Aspirin and Penicillins  Home Medications   Prior to Admission medications   Medication Sig Start Date End Date Taking? Authorizing Provider  amLODipine (NORVASC) 10 MG  tablet Take 1 tablet (10 mg total) by mouth daily. 03/30/14  Yes Josalyn C Funches, MD  atorvastatin (LIPITOR) 20 MG tablet Take 1 tablet (20 mg total) by mouth daily. 03/30/14  Yes Josalyn C Funches, MD  docusate sodium (COLACE) 100 MG capsule Take 200 mg by mouth 2 (two) times daily.  04/22/14  Yes Historical Provider, MD  ibuprofen (ADVIL,MOTRIN) 200 MG tablet Take 200 mg by mouth every 6 (six) hours as needed for moderate pain (pain).    Yes Historical Provider, MD  Multiple Vitamin (MULTIVITAMIN WITH MINERALS) TABS tablet Take 1 tablet by mouth daily.   Yes Historical Provider, MD  oxycodone (OXY-IR) 5 MG capsule Take 1 capsule (5 mg total) by mouth every 4 (four) hours as needed for pain. 04/29/14  Yes Melissa D Cross, NP  polyethylene glycol (MIRALAX / GLYCOLAX) packet Take 17 g by mouth daily. 04/30/14   Kalman Drape, MD   BP 152/83  Pulse 83  Temp(Src) 98.6 F (37 C) (Oral)  Resp 16  SpO2 100% Physical Exam  Constitutional: She is oriented to person, place, and time. She appears well-developed and well-nourished. She appears distressed.  HENT:  Head: Normocephalic and atraumatic.  Nose: Nose normal.  Mouth/Throat: Oropharynx is clear and moist.  Eyes: Conjunctivae and EOM are normal. Pupils are equal, round, and reactive to light.  Neck: Normal  range of motion. Neck supple. No JVD present. No tracheal deviation present. No thyromegaly present.  Cardiovascular: Normal rate, regular rhythm, normal heart sounds and intact distal pulses.  Exam reveals no gallop and no friction rub.   No murmur heard. Pulmonary/Chest: Effort normal and breath sounds normal. No stridor. No respiratory distress. She has no wheezes. She has no rales. She exhibits no tenderness.  Abdominal: Soft. She exhibits no distension and no mass. There is tenderness. There is no rebound and no guarding.  Hyperactive bowel sounds, diffuse abdominal pain with palpation.  No masses, abdomen is soft, no rebound or guarding   Musculoskeletal: Normal range of motion. She exhibits no edema and no tenderness.  Lymphadenopathy:    She has no cervical adenopathy.  Neurological: She is alert and oriented to person, place, and time. She displays normal reflexes. She exhibits normal muscle tone. Coordination normal.  Skin: Skin is warm and dry. No rash noted. No erythema. No pallor.  Psychiatric: She has a normal mood and affect. Her behavior is normal. Judgment and thought content normal.    ED Course  Procedures (including critical care time) Labs Review Labs Reviewed  URINALYSIS, ROUTINE W REFLEX MICROSCOPIC    Imaging Review Dg Abd 1 View  04/30/2014   CLINICAL DATA:  Lower abdominal pain, recent hysterectomy.  EXAM: ABDOMEN - 1 VIEW  COMPARISON:  04/06/2014 CT  FINDINGS: Hemidiaphragms/upper abdomen excluded from the field of view. Nonobstructive bowel gas pattern. No radiopaque calculi. No acute osseous finding.  IMPRESSION: Nonobstructive bowel gas pattern.   Electronically Signed   By: Carlos Levering M.D.   On: 04/30/2014 02:40     EKG Interpretation None      MDM   Final diagnoses:  Abdominal pain  Constipation due to pain medication    58 year old female with constipation most likely due to opiate medications.  Without intervention in the emergency department, patient has had several large bowel movements and reports resolution of her symptoms.  She has had some bleeding noted on the toilet tissue after her large bowel movement.  We'll start her on Mira lax, and have her follow-up with GYN onc as scheduled.    Kalman Drape, MD 04/30/14 [phone]

## 2014-04-30 NOTE — Discharge Instructions (Signed)
Preventing Constipation After Surgery Constipation is when a person has fewer than 3 bowel movements a week; has difficulty having a bowel movement; or has stools that are dry, hard, or larger than normal. Many things can make constipation likely after surgery. They include:  Medicines, especially numbing medicines (anesthetics) and very strong pain medicines called narcotics.  Feeling stressed because of the surgery.  Eating different foods than normal.  Being less active. Symptoms of constipation include:  Having fewer than 3 bowel movements a week.  Straining to have a bowel movement.  Having hard, dry, or larger-than-normal stools.  Feeling full or bloated.  Having pain in the lower abdomen.  Not feeling relief after having a bowel movement. HOME CARE INSTRUCTIONS  Diet  Eat foods that have a lot of fiber. These include fruits, vegetables, whole grains, and beans. Limit foods high in fat and processed sugars. These include french fries, hamburgers, cookies, and candy.  Take a fiber supplement as directed. If you are not taking a fiber supplement and think that you are not getting enough fiber from foods, talk to your health care provider about adding a fiber supplement to your diet.  Drink clear fluids, especially water. Avoid drinking alcohol, caffeine, and soda. These can make constipation worse.  Drink enough fluids to keep your urine clear or pale yellow. Activity   After surgery, return to your normal activities slowly or when your health care provider says it is okay.  Start walking as soon as you can. Try to go a little farther each day.  Once your health care provider approves, do some sort of regular exercise. This helps prevent constipation. Bowel Movements  Go to the restroom when you have the urge to go. Do not hold it in.  Try drinking something hot to get a bowel movement started.  Keep track of how often you use the restroom. If you miss 2-3 bowel  movements, talk to your health care provider about medicines that prevent constipation. Your health care provider may suggest a stool softener, laxative, or fiber supplement.  Only take over-the-counter or prescription medicines as directed by your health care provider.  Do not take other medicines without talking to your health care provider first. If you become constipated and take a medicine to make you have a bowel movement, the problem may get worse. Other kinds of medicine can also make the problem worse. SEEK MEDICAL CARE IF:  You used stool softeners or laxatives and still have not had a bowel movement within 24-48 hours after using them.  You have not had a bowel movement in 3 days. SEEK IMMEDIATE MEDICAL CARE IF:   Your constipation lasts for more than 4 days or gets worse.  You have bright red blood in your stool.  You have abdominal or rectal pain.  You have very bad cramping.  You have thin, pencil-like stools.  You have unexplained weight loss.  You have a fever or persistent symptoms for more than 2-3 days.  You have a fever and your symptoms suddenly get worse. Document Released: 10/13/2012 Document Revised: 11/02/2013 Document Reviewed: 10/13/2012 [facility] Patient Information 2015 [place], [place]. This information is not intended to replace advice given to you by your health care provider. Make sure you discuss any questions you have with your health care provider.  Constipation Constipation is when a person has fewer than three bowel movements a week, has difficulty having a bowel movement, or has stools that are dry, hard, or larger than normal.  As people grow older, constipation is more common. If you try to fix constipation with medicines that make you have a bowel movement (laxatives), the problem may get worse. Long-term laxative use may cause the muscles of the colon to become weak. A low-fiber diet, not taking in enough fluids, and taking certain medicines may  make constipation worse.  CAUSES   Certain medicines, such as antidepressants, pain medicine, iron supplements, antacids, and water pills.   Certain diseases, such as diabetes, irritable bowel syndrome (IBS), thyroid disease, or depression.   Not drinking enough water.   Not eating enough fiber-rich foods.   Stress or travel.   Lack of physical activity or exercise.   Ignoring the urge to have a bowel movement.   Using laxatives too much.  SIGNS AND SYMPTOMS   Having fewer than three bowel movements a week.   Straining to have a bowel movement.   Having stools that are hard, dry, or larger than normal.   Feeling full or bloated.   Pain in the lower abdomen.   Not feeling relief after having a bowel movement.  DIAGNOSIS  Your health care provider will take a medical history and perform a physical exam. Further testing may be done for severe constipation. Some tests may include:  A barium enema X-ray to examine your rectum, colon, and, sometimes, your small intestine.   A sigmoidoscopy to examine your lower colon.   A colonoscopy to examine your entire colon. TREATMENT  Treatment will depend on the severity of your constipation and what is causing it. Some dietary treatments include drinking more fluids and eating more fiber-rich foods. Lifestyle treatments may include regular exercise. If these diet and lifestyle recommendations do not help, your health care provider may recommend taking over-the-counter laxative medicines to help you have bowel movements. Prescription medicines may be prescribed if over-the-counter medicines do not work.  HOME CARE INSTRUCTIONS   Eat foods that have a lot of fiber, such as fruits, vegetables, whole grains, and beans.  Limit foods high in fat and processed sugars, such as french fries, hamburgers, cookies, candies, and soda.   A fiber supplement may be added to your diet if you cannot get enough fiber from foods.    Drink enough fluids to keep your urine clear or pale yellow.   Exercise regularly or as directed by your health care provider.   Go to the restroom when you have the urge to go. Do not hold it.   Only take over-the-counter or prescription medicines as directed by your health care provider. Do not take other medicines for constipation without talking to your health care provider first.  [place] IF:   You have bright red blood in your stool.   Your constipation lasts for more than 4 days or gets worse.   You have abdominal or rectal pain.   You have thin, pencil-like stools.   You have unexplained weight loss. MAKE SURE YOU:   Understand these instructions.  Will watch your condition.  Will get help right away if you are not doing well or get worse. Document Released: 03/16/2004 Document Revised: 06/23/2013 Document Reviewed: 03/30/2013 [facility] Patient Information 2015 [place], [place]. This information is not intended to replace advice given to you by your health care provider. Make sure you discuss any questions you have with your health care provider.

## 2014-04-30 NOTE — ED Notes (Signed)
Per EMS pt had a hysterectomy a week ago Wed and has been on opiate pain medication   Pt had her foley catheter removed yesterday  Pt states she has not had a BM since Saturday  Pt is c/o lower abd pain  Pt states she was not informed to take a stool softner with the pain medication  Pt states she attempted an enema today but was not able to hold it in very well and tried prune juice without results  Pt states her pain is a crampy intermittent pain

## 2014-04-30 NOTE — ED Notes (Signed)
Patient transported to X-ray 

## 2014-04-30 NOTE — ED Notes (Signed)
Patient sitting in bed, smiling.  She reports that she ambulated to restroom and had an extra large BM.  She states she feels much better.

## 2014-05-04 ENCOUNTER — Ambulatory Visit (HOSPITAL_COMMUNITY)
Admission: RE | Admit: 2014-05-04 | Discharge: 2014-05-04 | Disposition: A | Discharge status: Home / Self Care | Payer: Self-pay | Source: Ambulatory Visit | Attending: Gynecologic Oncology | Admitting: Gynecologic Oncology

## 2014-05-04 ENCOUNTER — Encounter (HOSPITAL_COMMUNITY): Payer: Self-pay

## 2014-05-04 DIAGNOSIS — C539 Malignant neoplasm of cervix uteri, unspecified: Secondary | ICD-10-CM | POA: Insufficient documentation

## 2014-05-04 DIAGNOSIS — Z90710 Acquired absence of both cervix and uterus: Secondary | ICD-10-CM | POA: Insufficient documentation

## 2014-05-04 DIAGNOSIS — Z90722 Acquired absence of ovaries, bilateral: Secondary | ICD-10-CM | POA: Insufficient documentation

## 2014-05-04 DIAGNOSIS — K598 Other specified functional intestinal disorders: Secondary | ICD-10-CM | POA: Insufficient documentation

## 2014-05-04 DIAGNOSIS — R188 Other ascites: Secondary | ICD-10-CM | POA: Insufficient documentation

## 2014-05-04 LAB — GLUCOSE, CAPILLARY: GLUCOSE-CAPILLARY: 111 mg/dL — AB (ref 70–99)

## 2014-05-04 MED ORDER — FLUDEOXYGLUCOSE F - 18 (FDG) INJECTION
6.3300 | Freq: Once | INTRAVENOUS | Status: AC | PRN
  Administered 2014-05-04: 6.33 via INTRAVENOUS

## 2014-05-05 ENCOUNTER — Telehealth: Payer: Self-pay | Admitting: *Deleted

## 2014-05-05 NOTE — Telephone Encounter (Signed)
-----   Message from Everitt Amber, MD sent at 05/05/2014  1:15 PM EST ----- I favor postoperative changes as the etiology for the peritoneal uptake around the loops of small bowel. This scan does not change our current recommendations for chemoradiation with Dr Sondra Come and Dr Marko Plume. Thanks Everitt Amber

## 2014-05-05 NOTE — Telephone Encounter (Signed)
Called pt with results. Pt verbalized understanding. No further concerns.

## 2014-05-12 ENCOUNTER — Ambulatory Visit: Payer: Self-pay | Admitting: Radiation Oncology

## 2014-05-12 ENCOUNTER — Ambulatory Visit: Payer: Self-pay

## 2014-05-13 ENCOUNTER — Telehealth: Payer: Self-pay | Admitting: Oncology

## 2014-05-13 NOTE — Telephone Encounter (Signed)
C/D ON 11/12 FOR NP APPT ON 11/13

## 2014-05-14 ENCOUNTER — Other Ambulatory Visit: Payer: Self-pay

## 2014-05-14 ENCOUNTER — Ambulatory Visit: Payer: Self-pay | Admitting: Oncology

## 2014-05-14 ENCOUNTER — Telehealth: Payer: Self-pay | Admitting: *Deleted

## 2014-05-14 ENCOUNTER — Ambulatory Visit: Payer: Self-pay

## 2014-05-14 ENCOUNTER — Other Ambulatory Visit: Payer: Self-pay | Admitting: Oncology

## 2014-05-14 DIAGNOSIS — C539 Malignant neoplasm of cervix uteri, unspecified: Secondary | ICD-10-CM

## 2014-05-14 NOTE — Telephone Encounter (Signed)
Attempted to call patient in regards to missed appts for today. Left VM on home phone. Cell phone had recording that "this number is not currently taking calls." Told pt to call us back to reschedule and let us know she is okay.

## 2014-05-14 NOTE — Progress Notes (Signed)
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  Biopsies revealed:   04/12/14 Diagnosis Cervix, biopsy, interior cervix between six and nine - INVASIVE SQUAMOUS CELL CARCINOMA  04/21/14 Diagnosis:  A:Ovary and tube, right, RSO  - Serous cystadenoma, size 5.6 cm  - Focal endometriosis  - Fallopian tube with hydrosalpinx, chronic salpingitis and adhesions  - Other findings: Scant adherent ovarian stroma with calcifications,  endosalpingiosis and adhesions  - No borderline tumor or malignancy identified     B:Lymph nodes, left pelvic, biopsy  - Eight lymph nodes negative for metastatic carcinoma (0/8)    C:Lymph nodes, right pelvic, regional resection  - Three lymph nodes, negative for metastatic carcinoma (0/3)     D:Lymph nodes, left obturator, biopsy  - Two lymph nodes, negative for metastatic carcinoma (0/2)     E:Uterus, cervix, left tube and ovary and upper vagina, radical hysterectomy  and left salpingo-oophorectomy    Histologic type:Invasive squamous cell carcinoma    Histologic grade:Moderately to poorly differentiated     Past/Anticipated interventions by Gyn/Onc surgery, if any: 04/21/14 - robotic radical hysterectomy, bso, pelvic lymphadenectomy by   Past/Anticipated interventions by medical oncology, if any: apt with Dr. Marko Plume 06/04/14  Weight changes, if any: no  Bowel/Bladder complaints, if any: has a bowel movement daily but has to strain.  Takes a stool softener daily.  Nausea/Vomiting, if any: no  Pain issues, if any:  no  SAFETY ISSUES:  Prior radiation? no  Pacemaker/ICD? no  Possible current pregnancy? no  Is the patient on methotrexate? no  Current Complaints / other details:  Patient is here with her daughter.  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 She is trying to quit smoking and smokes 2-3 cigarettes per day.

## 2014-05-17 ENCOUNTER — Telehealth: Payer: Self-pay | Admitting: *Deleted

## 2014-05-17 ENCOUNTER — Telehealth: Payer: Self-pay | Admitting: Oncology

## 2014-05-17 NOTE — Telephone Encounter (Signed)
S/W PATIENT AND GAVE NP APPT FOR 12/07 @ 10:30 W/DR. LIVESAY, CHEMO EDU 12/03 @ 10 ALL APPTS CONFIRMED.

## 2014-05-17 NOTE — Telephone Encounter (Signed)
Per staff message and POF I have scheduled appts. Advised scheduler of appts. JMW  

## 2014-05-18 ENCOUNTER — Telehealth: Payer: Self-pay | Admitting: Oncology

## 2014-05-18 NOTE — Telephone Encounter (Signed)
Left message for patient and gave new d/t for appt 12/04 @ 10:30 w/Dr. Marko Plume. Contact information left for patient to return call to confirm message/new patient appt.

## 2014-05-19 ENCOUNTER — Ambulatory Visit
Admission: RE | Admit: 2014-05-19 | Discharge: 2014-05-19 | Disposition: A | Discharge status: Home / Self Care | Payer: Self-pay | Source: Ambulatory Visit | Attending: Radiation Oncology | Admitting: Radiation Oncology

## 2014-05-19 ENCOUNTER — Telehealth: Payer: Self-pay | Admitting: Oncology

## 2014-05-19 ENCOUNTER — Encounter: Payer: Self-pay | Admitting: Radiation Oncology

## 2014-05-19 VITALS — BP 151/92 | HR 82 | Temp 97.9°F | Resp 16 | Ht 67.0 in | Wt 126.1 lb

## 2014-05-19 DIAGNOSIS — Z90710 Acquired absence of both cervix and uterus: Secondary | ICD-10-CM | POA: Insufficient documentation

## 2014-05-19 DIAGNOSIS — C539 Malignant neoplasm of cervix uteri, unspecified: Secondary | ICD-10-CM

## 2014-05-19 DIAGNOSIS — Z510 Encounter for antineoplastic radiation therapy: Secondary | ICD-10-CM | POA: Insufficient documentation

## 2014-05-19 DIAGNOSIS — Z9079 Acquired absence of other genital organ(s): Secondary | ICD-10-CM | POA: Insufficient documentation

## 2014-05-19 DIAGNOSIS — F17210 Nicotine dependence, cigarettes, uncomplicated: Secondary | ICD-10-CM | POA: Insufficient documentation

## 2014-05-19 DIAGNOSIS — I10 Essential (primary) hypertension: Secondary | ICD-10-CM | POA: Insufficient documentation

## 2014-05-19 DIAGNOSIS — Z90722 Acquired absence of ovaries, bilateral: Secondary | ICD-10-CM | POA: Insufficient documentation

## 2014-05-19 NOTE — Telephone Encounter (Signed)
return patient called and gave appt for 12/03 @ 10 chemo edu, 12/04 @ 10:30 w/Dr. Marko Plume

## 2014-05-19 NOTE — Progress Notes (Signed)
Please see the Nurse Progress Note in the MD Initial Consult Encounter for this patient. 

## 2014-05-19 NOTE — Progress Notes (Signed)
Radiation Oncology         [id number]) [phone] ________________________________  Initial outpatient Consultation  Name: Martha Koch MRN: [id number]  Date: 05/19/2014  DOB: 1956/01/21  YT:KZSWFUX, Lennox Laity, MD  Everitt Amber, MD   REFERRING PHYSICIAN: Everitt Amber, MD  DIAGNOSIS: Stage IB1 invasive squamous carcinoma of the cervix, high risk features  HISTORY OF PRESENT ILLNESS::Martha Koch is a 58 y.o. female who is seen out of the courtesy of Dr. Denman George for an opinion concerning radiation therapy as part of management of patient's recently diagnosed cervical cancer. Patient presented with vaginal discharge early this year. She was ultimately found to have invasive squamous cell carcinoma of cervix on biopsy 04/12/2014. On 04/21/2014 the patient underwent robotic-assisted radical hysterectomy with upper vaginectomy, bilateral salpingo-oophorectomy, bilateral pelvic lymphadenectomy direction of Dr. Skeet Latch at [facility]. The patient was found to have a moderate to poorly differentiated squamous cell carcinoma extending from the cervix into the lower uterine segment. the tumor did extend into the bilateral parametria without involved margins. The deep cervical stroma margin was close ( 3 mm) but negative. In addition there was perineural invasion and lymphovascular space invasion. a total of 18 lymph nodes were removed from the pelvis none of which showed malignancy. Patient had an uneventful postoperative course.Marland Kitchengiven the high risk for recurrence adjuvant postoperative treatment including pelvic radiation therapy and radiosensitizing chemotherapy is recommended. Patient is now seen in radiation oncology for consideration for postoperative treatments. The patient did undergo a PET scan after her surgery showing no evidence of residual disease within the pelvis or spread into the periaortic  area or other regions.  PREVIOUS RADIATION THERAPY: No  PAST MEDICAL HISTORY:  has a past medical  history of Hypertension and Cancer (cervical ).    PAST SURGICAL HISTORY: Past Surgical History  Procedure Laterality Date  . Abdominal hysterectomy    . Robotic radical hysterectomy, bso, pelvic lymphadenectomy Bilateral 04/21/14  . Tubal ligation      FAMILY HISTORY: family history includes Cancer in her maternal aunt and other; Diabetes in her other; Hypertension in her father.  SOCIAL HISTORY:  reports that she has been smoking Cigarettes.  She has a 21 pack-year smoking history. She has never used smokeless tobacco. She reports that she does not drink alcohol or use illicit drugs.  ALLERGIES: Aspirin and Penicillins  MEDICATIONS:  Current Outpatient Prescriptions  Medication Sig Dispense Refill  . amLODipine (NORVASC) 10 MG tablet Take 1 tablet (10 mg total) by mouth daily. 90 tablet 3  . Ascorbic Acid (VITAMIN C) 1000 MG tablet Take 1,000 mg by mouth daily.    Marland Kitchen atorvastatin (LIPITOR) 20 MG tablet Take 1 tablet (20 mg total) by mouth daily. 90 tablet 3  . docusate sodium (COLACE) 100 MG capsule Take 200 mg by mouth 2 (two) times daily.     Marland Kitchen ibuprofen (ADVIL,MOTRIN) 200 MG tablet Take 200 mg by mouth every 6 (six) hours as needed for moderate pain (pain).     . Multiple Vitamin (MULTIVITAMIN WITH MINERALS) TABS tablet Take 1 tablet by mouth daily.    Marland Kitchen oxycodone (OXY-IR) 5 MG capsule Take 1 capsule (5 mg total) by mouth every 4 (four) hours as needed for pain. 45 capsule 0  . polyethylene glycol (MIRALAX / GLYCOLAX) packet Take 17 g by mouth daily. 14 each 0   No current facility-administered medications for this encounter.    REVIEW OF SYSTEMS:  A 15 point review of systems is documented in the electronic  medical record. This was obtained by the nursing staff. However, I reviewed this with the patient to discuss relevant findings and make appropriate changes.  The patient denies any pain within the pelvis area. She denies any vaginal discharge or bleeding. She denies any blood  in her urine or rectal bleeding. Her bladder sensation has returned since her surgery. She denies any cough or breathing problems. Patient denies any new bony pain headaches dizziness or blurred vision.   PHYSICAL EXAM:  height is 5\' 7"  (1.702 m) and weight is 126 lb 1.6 oz (57.199 kg). Her oral temperature is 97.9 F (36.6 C). Her blood pressure is 151/92 and her pulse is 82. Her respiration is 16.   BP 151/92 mmHg  Pulse 82  Temp(Src) 97.9 F (36.6 C) (Oral)  Resp 16  Ht 5\' 7"  (1.702 m)  Wt 126 lb 1.6 oz (57.199 kg)  BMI 19.75 kg/m2  General Appearance:    Alert, cooperative, no distress, appears stated age, accompanied by daughter on evaluation today  Head:    Normocephalic, without obvious abnormality, atraumatic  Eyes:    PERRL, conjunctiva/corneas clear, EOM's intact,         Nose:   Nares normal, septum midline, mucosa normal, no drainage    or sinus tenderness  Throat:   Lips, mucosa, and tongue normal;  gums normal  Neck:   Supple, symmetrical, trachea midline, no adenopathy;    thyroid:  no enlargement/tenderness/nodules; no carotid   bruit or JVD  Back:     Symmetric, no curvature, ROM normal, no CVA tenderness  Lungs:     Clear to auscultation bilaterally, respirations unlabored  Chest Wall:    No tenderness or deformity   Heart:    Regular rate and rhythm, S1 and S2 normal, no murmur, rub   or gallop     Abdomen:     Soft, non-tender, bowel sounds active all four quadrants,    no masses, no organomegaly, small scars present from laparoscopic procedure well-healed without signs of drainage or infection  Genitalia:  Deferred until simulation and planning day, exam by Dr. Skeet Latch in November 13 showed the vaginal cuff to be intact and healing well.     Extremities:   Extremities normal, atraumatic, no cyanosis or edema  Pulses:   2+ and symmetric all extremities  Skin:   Skin color, texture, turgor normal, no rashes or lesions  Lymph nodes:   Cervical, supraclavicular,  and axillary nodes normal  Neurologic:   normal strength, sensation and reflexes    throughout     ECOG = 1    1 - Symptomatic but completely ambulatory (Restricted in physically strenuous activity but ambulatory and able to carry out work of a light or sedentary nature. For example, light housework, office work)  LABORATORY DATA:  Lab Results  Component Value Date   WBC 7.0 02/22/2014   HGB 13.1 02/22/2014   HCT 39.5 02/22/2014   MCV 79.3 02/22/2014   PLT 307 02/22/2014   NEUTROABS 3.0 02/22/2014   Lab Results  Component Value Date   NA 140 04/01/2014   K 4.5 04/01/2014   CL 104 04/01/2014   CO2 27 04/01/2014   GLUCOSE 98 04/01/2014   CREATININE 0.83 04/01/2014   CALCIUM 9.8 04/01/2014      RADIOGRAPHY: Dg Abd 1 View  04/30/2014   CLINICAL DATA:  Lower abdominal pain, recent hysterectomy.  EXAM: ABDOMEN - 1 VIEW  COMPARISON:  04/06/2014 CT  FINDINGS: Hemidiaphragms/upper abdomen excluded from  the field of view. Nonobstructive bowel gas pattern. No radiopaque calculi. No acute osseous finding.  IMPRESSION: Nonobstructive bowel gas pattern.   Electronically Signed   By: Carlos Levering M.D.   On: 04/30/2014 02:40   Nm Pet Image Initial (pi) Skull Base To Thigh  05/04/2014   CLINICAL DATA:  Initial treatment strategy for its cervical cancer. Status post radical hysterectomy on 04/21/2014. Staging. Cervical cancer C53.9 (ICD-10-CM)  EXAM: NUCLEAR MEDICINE PET SKULL BASE TO THIGH  TECHNIQUE: 6.3 mCi F-18 FDG was injected intravenously. Full-ring PET imaging was performed from the skull base to thigh after the radiotracer. CT data was obtained and used for attenuation correction and anatomic localization.  FASTING BLOOD GLUCOSE:  Value: 111 mg/dl  COMPARISON:  CT abdomen pelvis 04/06/2014.  FINDINGS: NECK  No areas of abnormal hypermetabolism.  CHEST  No areas of abnormal hypermetabolism.  ABDOMEN/PELVIS  No areas of abnormal hypermetabolism.  SKELETON  Anterior abdominal wall foci  of hypermetabolism and subcutaneous increased density including in the left side of the abdomen on image 122, midline image 127, and right-sided abdomen on image 129. These likely represent port locations.  Low-level hypermetabolism about the serosa of pelvic bowel loops. This corresponds to small volume pelvic fluid. No abdominal pelvic nodal hypermetabolism.  CT IMAGES PERFORMED FOR ATTENUATION CORRECTION  No cervical adenopathy.  Centrilobular emphysema.  Small volume abdominal ascites. Mild left adrenal thickening. Age advanced aortic atherosclerosis.  Contrast filled small bowel loops which measure up to 2.7 cm on image 142. Upper normal. Air which is presumably within the vaginal fornices on image 175 of series 4. There is more cephalad extension of gas within the right hemipelvis on image 168 of series 4. No well-defined fluid collection.  IMPRESSION: 1. Interval hysterectomy and bilateral oophorectomy. No solid hypermetabolic foci or abdominal pelvic hypermetabolic nodes to suggest metastatic disease. 2. Low-level hypermetabolism about pelvic bowel loops. Small volume abdominal pelvic fluid. Right pelvic gas which may be within the high vaginal fornix. However, extraluminal intraperitoneal air cannot be excluded. Therefore, the low-level hypermetabolism could simply be postoperative. However, peritoneal inflammation could have a similar appearance. Especially if there is a concern of postsurgical infection, consider dedicated contrast-enhanced abdominal pelvic CT. 3. Borderline dilated small bowel loops, favor postoperative adynamic ileus.   Electronically Signed   By: Abigail Miyamoto M.D.   On: 05/04/2014 11:48      IMPRESSION:  Stage IB1 invasive squamous carcinoma of the cervix.  The patient's pathology shows high-risk features and I would agree with recommendations for postoperative adjuvant treatment. I discussed the treatment course side effects and potential toxicities of radiation therapy in this  situation with the patient and her daughter. She appears to understand and wishes to proceed with planned course of treatment. The patient may be a potential candidate for brachytherapy in addition to her external beam treatments and will discuss this issue with gynecologic oncology at a later date.  PLAN:simulation and planning tomorrow with treatments to begin approximately 6 weeks postop.   I spent 60 minutes minutes face to face with the patient and more than 50% of that time was spent in counseling and/or coordination of care.   ------------------------------------------------  Blair Promise, PhD, MD

## 2014-05-20 ENCOUNTER — Encounter: Payer: Self-pay | Admitting: Oncology

## 2014-05-20 ENCOUNTER — Ambulatory Visit
Admission: RE | Admit: 2014-05-20 | Discharge: 2014-05-20 | Disposition: A | Discharge status: Home / Self Care | Payer: Self-pay | Source: Ambulatory Visit | Attending: Radiation Oncology | Admitting: Radiation Oncology

## 2014-05-20 DIAGNOSIS — C539 Malignant neoplasm of cervix uteri, unspecified: Secondary | ICD-10-CM

## 2014-05-20 MED ORDER — SODIUM CHLORIDE 0.9 % IJ SOLN
10.0000 mL | Freq: Once | INTRAMUSCULAR | Status: AC
  Administered 2014-05-20: 10 mL via INTRAVENOUS

## 2014-05-20 NOTE — Progress Notes (Signed)
Patient arrived ambulatory,steady gait, no pain voiced,gave name and dob as identification, labs wnl 04/16/14(BUN=8,/CR=0.73) from care everywhere,  , not allergic to IV dye, not a diabetic,  Started IV catheter in LAC x 1 attempt, excellent blood return, patient tolerated well, notified Ct sim [place],RT therapist patient in room 3 10:31 AM CT sim completed , removed IV catheter from [place] ,all catheter and tip intact, 2 2x2 gause , reminded patient to keep arm bent 1-2 minutes, and levae dresing on arm for 2-3 hours, and if removing after that and still LAC bleeds,put dressing back on and hold pressure 5 minutes, keep on overnight and can apply bandaid over site if needed, verbal understanding ,patien td/c home 11:54 AM

## 2014-05-20 NOTE — Progress Notes (Signed)
Cisplatin is not a replaceable drug

## 2014-05-21 NOTE — Progress Notes (Signed)
  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  Identity was confirmed.  All relevant records and images related to the planned course of therapy were reviewed.  The patient freely provided informed written consent to proceed with treatment after reviewing the details related to the planned course of therapy. The consent form was witnessed and verified by the simulation staff.  Then, the patient was set-up in a stable reproducible  supine position for radiation therapy.  CT images were obtained.  Surface markings were placed.  The CT images were loaded into the planning software.  Then the target and avoidance structures were contoured.  Treatment planning then occurred.  The radiation prescription was entered and confirmed.  Then, I designed and supervised the construction of a total of 1 medically necessary complex treatment devices.  I have requested : Intensity Modulated Radiotherapy (IMRT) is medically necessary for this case for the following reason:  Small bowel sparing..  I have ordered:dose calc.  PLAN:  The patient will receive 45 Gy in 25 fractions followed by a boost to the vaginal cuff region using either external beam or brachytherapy.  ________________________________   Special treatment procedure note   The patient will be receiving radiosensitizing chemotherapy during the course of treatment. Given the increased potential for toxicities as well as the necessity for close monitoring of the patient and bloodwork, this constitutes a special treatment procedure. -----------------------------------  Blair Promise, PhD, MD

## 2014-05-26 ENCOUNTER — Encounter: Payer: Self-pay | Admitting: Oncology

## 2014-05-26 NOTE — Progress Notes (Signed)
Pt is approved for the $400 [facility] and $400 [employer] grants.  °

## 2014-06-02 ENCOUNTER — Ambulatory Visit
Admission: RE | Admit: 2014-06-02 | Discharge: 2014-06-02 | Disposition: A | Discharge status: Home / Self Care | Payer: Self-pay | Source: Ambulatory Visit | Attending: Radiation Oncology | Admitting: Radiation Oncology

## 2014-06-02 DIAGNOSIS — C539 Malignant neoplasm of cervix uteri, unspecified: Secondary | ICD-10-CM

## 2014-06-03 ENCOUNTER — Encounter: Payer: Self-pay | Admitting: *Deleted

## 2014-06-03 ENCOUNTER — Ambulatory Visit
Admission: RE | Admit: 2014-06-03 | Discharge: 2014-06-03 | Disposition: A | Discharge status: Home / Self Care | Payer: Self-pay | Source: Ambulatory Visit | Attending: Radiation Oncology | Admitting: Radiation Oncology

## 2014-06-03 ENCOUNTER — Other Ambulatory Visit: Payer: Self-pay

## 2014-06-04 ENCOUNTER — Ambulatory Visit
Admission: RE | Admit: 2014-06-04 | Discharge: 2014-06-04 | Disposition: A | Discharge status: Home / Self Care | Payer: Self-pay | Source: Ambulatory Visit | Attending: Radiation Oncology | Admitting: Radiation Oncology

## 2014-06-04 ENCOUNTER — Ambulatory Visit (HOSPITAL_BASED_OUTPATIENT_CLINIC_OR_DEPARTMENT_OTHER): Payer: Self-pay | Admitting: Oncology

## 2014-06-04 ENCOUNTER — Ambulatory Visit: Payer: Self-pay

## 2014-06-04 ENCOUNTER — Telehealth: Payer: Self-pay | Admitting: Oncology

## 2014-06-04 ENCOUNTER — Encounter: Payer: Self-pay | Admitting: Oncology

## 2014-06-04 ENCOUNTER — Telehealth: Payer: Self-pay | Admitting: *Deleted

## 2014-06-04 ENCOUNTER — Other Ambulatory Visit (HOSPITAL_BASED_OUTPATIENT_CLINIC_OR_DEPARTMENT_OTHER): Payer: Self-pay

## 2014-06-04 VITALS — BP 153/84 | HR 78 | Temp 98.5°F | Resp 20 | Ht 67.0 in | Wt 128.1 lb

## 2014-06-04 DIAGNOSIS — C539 Malignant neoplasm of cervix uteri, unspecified: Secondary | ICD-10-CM

## 2014-06-04 DIAGNOSIS — Z720 Tobacco use: Secondary | ICD-10-CM

## 2014-06-04 LAB — CBC WITH DIFFERENTIAL/PLATELET
BASO%: 0.2 % (ref 0.0–2.0)
Basophils Absolute: 0 10*3/uL (ref 0.0–0.1)
EOS%: 1.1 % (ref 0.0–7.0)
Eosinophils Absolute: 0.1 10*3/uL (ref 0.0–0.5)
HCT: 38.7 % (ref 34.8–46.6)
HGB: 12.7 g/dL (ref 11.6–15.9)
LYMPH%: 30.4 % (ref 14.0–49.7)
MCH: 26.3 pg (ref 25.1–34.0)
MCHC: 32.8 g/dL (ref 31.5–36.0)
MCV: 80.1 fL (ref 79.5–101.0)
MONO#: 0.3 10*3/uL (ref 0.1–0.9)
MONO%: 6 % (ref 0.0–14.0)
NEUT#: 2.8 10*3/uL (ref 1.5–6.5)
NEUT%: 62.3 % (ref 38.4–76.8)
NRBC: 0 % (ref 0–0)
Platelets: 229 10*3/uL (ref 145–400)
RBC: 4.83 10*6/uL (ref 3.70–5.45)
RDW: 15.9 % — ABNORMAL HIGH (ref 11.2–14.5)
WBC: 4.5 10*3/uL (ref 3.9–10.3)
lymph#: 1.4 10*3/uL (ref 0.9–3.3)

## 2014-06-04 LAB — IRON AND TIBC CHCC
%SAT: 28 % (ref 21–57)
IRON: 81 ug/dL (ref 41–142)
TIBC: 289 ug/dL (ref 236–444)
UIBC: 208 ug/dL (ref 120–384)

## 2014-06-04 LAB — COMPREHENSIVE METABOLIC PANEL (CC13)
ALK PHOS: 86 U/L (ref 40–150)
ALT: 23 U/L (ref 0–55)
AST: 20 U/L (ref 5–34)
Albumin: 4 g/dL (ref 3.5–5.0)
Anion Gap: 11 mEq/L (ref 3–11)
BILIRUBIN TOTAL: 0.32 mg/dL (ref 0.20–1.20)
BUN: 8.8 mg/dL (ref 7.0–26.0)
CALCIUM: 10.2 mg/dL (ref 8.4–10.4)
CHLORIDE: 103 meq/L (ref 98–109)
CO2: 29 mEq/L (ref 22–29)
Creatinine: 0.8 mg/dL (ref 0.6–1.1)
EGFR: >90 mL/min/{1.73_m2} (ref >90)
Glucose: 90 mg/dl (ref 70–140)
Potassium: 4.3 mEq/L (ref 3.5–5.1)
Sodium: 143 mEq/L (ref 136–145)
Total Protein: 7.7 g/dL (ref 6.4–8.3)

## 2014-06-04 LAB — MAGNESIUM (CC13): Magnesium: 2.4 mg/dl (ref 1.5–2.5)

## 2014-06-04 NOTE — Progress Notes (Signed)
[place] NEW PATIENT EVALUATION   Name: Martha Koch Date: 06/04/2014 MRN: [id number] DOB: February 16, 1956  REFERRING PHYSICIAN: Terrence Dupont Rossi/ [name], CC: Gery Pray, Lavonia Drafts, [name] (PCP, [facility])   REASON FOR REFERRAL: cervical carcinoma, for sensitizing chemotherapy with radiation   HISTORY OF PRESENT ILLNESS:Martha Koch is a 58 y.o. female who is seen in consultation, together with daughter , at the request of  Drs Denman George and Skeet Latch, for consideration of sensitizing CDDP chemotherapy with radiation for IB1 invasive squamous cell carcinoma of cervix with high risk features. She had robotic radical hysterectomy with upper vaginectomy/ BSO/ bilateral pelvic node evaluation at [facility] on 04-21-14. She missed first new patient appointment with me on 05-14-14, began radiation on 06-02-14, has had chemotherapy education and is scheduled for first chemo on 06-07-14.   Patient presented to ED with 1 year of vaginal discharge/ dark spotting 02-22-14, Trichomonas +. US showed multiple uterine fibroids and 6.5 cm cyst in right ovary with thickened septation and peripheral nodularity. 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 Exam 04-01-14 was remarkable for cervix hard, friable, irregular and PAP ([id number])  had CIN-3/CIS with suspicion for invasion. CT AP 04-06-14: 3.9 cm mass in the cervix, suspicious for cervical carcinoma; other uterine myometrial masses measuring up to 4 cm, most likely representing fibroids,7.6 cm complex cystic lesion in right adnexa and cul-de-sac. She saw Dr Skeet Latch 04-12-14, with cervical biopsy ([id number] ) documenting invasive squamous cell carcinoma. She went to surgery at [facility] 04-21-14 by Dr Skeet Latch, this robotic radical hysterectomy with upper vaginectomy, BSO and bilateral pelvic lymphadenectomy, with  barrel shaped cervix, 2 cm lesion on cervix, uterus slightly enlarged and large right ovarian cyst. .Pathology from [facility] 04-21-14 [phone]) cervix with moderately to poorly differentiated squamous cell carcinoma, circumferential within cervix and involving anterior and posterior lower uterine segments and endometrium, invading into myometrium and extensively into bilateral parametrial soft tissue. Perineural and LVSI present as well as large vessel involvement. Surgical margins 3 mm from anterior deep cervical stromal margin and vaginal cuff widely negative. * left pelvic nodes, 3 right pelvic nodes,  2 left obturator nodes and 5 parametrial nodes all negative. Right ovary and tube with serous cystadenoma. Adenomatous endometrial polyp with ulceration, also involved by squamous cell carcinoma. Pelvic washings ([id number]) no malignant cells identified. Staging was IB1.   [facility] multidisciplinary gyn oncology conference 04-28-14 recommended whole pelvic radiation with sensitizing CDDP and suggested consideration of PET CT to evaluate peri-aortic involvement given small number of nodes sampled on right. Patient had post op follow up with Dr Denman George in [place] on 04-29-14, foley removed then. She had ER visit on 10-30 due to constipation from pain medication. PET 05-04-14 showed no abdominal or pelvic hypermetabolic nodes or other evidence of metastatic disease, tho some low level hypermetabolism around pelvic bowel loops and small volume ascites thought post op or inflammation, and borderline dilated small bowel loops thought post op adynamic ileus.  She began IMRT by Dr Sondra Come on 06-03-14 (?), this scheduled thru 07-08-13.      REVIEW OF SYSTEMS  Usual good weight prior to illness 148 lbs, then lost 20 lbs during several months prior to surgery despite good appetite. Has gained some weight since surgery. No significant pain now and has stopped using pain medication since ER visit with constipation. Has resumed  driving, is still restricted for  lifting. No HA. Good visual acuity with reading glasses. No sinus symptoms. No difficulty hearing. Wears dentures. No history of thyroid disease (tho daughters have this) with normal TSH in 03-2014. No SOB, slight NP cough not new. Last mammograms ~ 2 years ago at [facility], no breast concerns.No GERD or N/V. Bowels moving fairly well now. New since surgery is stress urinary incontinence with cough or sneeze, no dysuria or hematuria. Some pelvic pressure when walks. No bleeding. Did not use lovenox injections after DC home. No arthritis. No LE swelling. No neuropathy symptoms. Remainder of full 10 point review of systems negative.   ALLERGIES: Aspirin and Penicillins  PAST MEDICAL/ SURGICAL HISTORY:    G3 Abnormal PAP 2002 with LEEP (? Cousins, Dickstein) HTN recently diagnosed Elevated cholesterol recently diagnosed BTL remotely Last mammograms ~ 2 years ago,   CURRENT MEDICATIONS: reviewed as listed now in EMR. Prescriptions for miralax, ondansetron, lorazepam.  PHARMACY: [facility], with patient assistance funds (otherwise uses [facility] outpatient pharmacy, which does not track patient assistance funds per [facility] financial staff now)   SOCIAL HISTORY:  Originally from [place], lives alone, single. 3 daughters in [place], [place] grands ages 40 to 2 years. Has smoked 1 ppd since age 58, never quit, but is interested in quitting now and is actively trying to do so, down to 4 cigarettes in 24 hours. Drinks coffee "all day". No ETOH. 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   FAMILY HISTORY:   Six maternal aunts with postmenopausal breast cancer, 4 died with metastatic brease 2 daughters with hyper and hypo- thyroidism Sister with thyroid disease         PHYSICAL EXAM:  height is 5' 7"  (1.702 m) and weight is 128 lb 1.6 oz (58.106 kg). Her oral temperature is 98.5 F (36.9 C). Her blood pressure is 153/84 and her pulse is 78. Her  respiration is 20 and oxygen saturation is 100%.  Alert, pleasant, cooperative lady looks stated age. Ambulatory, able to get on and off exam table and to change positions.  HEENT: PERRL, not icteric. No alopecia. Oral mucosa moist and clear, dentures. Neck supple without JVD, thyroid mass.   RESPIRATORY: Respirations not labored RA, no cough during exam. Breath sounds somewhat diminished thruout without wheezes or crackles. No dullness to percussion.  CARDIAC/ VASCULAR: heart RRR no gallop. Peripheral pulses intact  ABDOMEN:Incisions from laparoscopic surgery closed and not remarkable. Soft, not tender, some BS, no HSM or mass.   LYMPH NODES: no cervical, supraclavicular, axillary or inguinal adenopathy  BREASTS: bilaterally without dominant mass, skin or nipple findings of concern  NEUROLOGIC: speech fluent and appropriate, CN, motor, sensory, cerebellar nonfocal. PSYCH appropriate mood and affect  SKIN: without rash, ecchymosis, petechiae  MUSCULOSKELETAL:Back nontender. LE without edema, cords or tenderness. No clubbing   LABORATORY DATA:  CBC with WBC 4.5, ANC 2.8, Hgb 12.7, plt 229k, MCV 80 CMET  With NA 143, K 4.3, Cl 103, glu 90, BUN 8.8, creat 0.6, Tbili 0.3, AP 86, AST 20, ALT 23, Tprot 7.7, alb 4.0, Ca 10.2, EGFR >90 Mg  2.4 Iron 81, %sat 28    PATHOLOGY:   PAP 04-01-14 Forehand, Miyuki A Collected: 04/01/2014 Client: [facility] Accession: [id number] Received: 04/01/2014 Lavonia Drafts, MD: GYNECOLOGIC CYTOLOGY REPORT Adequacy Reason Satisfactory for evaluation, endocervical/transformation zone component PRESENT. Diagnosis AT LEAST HIGH GRADE SQUAMOUS INTRAEPITHELIAL LESION: CIN-3/CIS (HSIL). COMMENT: THERE ARE SOME CELLS PRESENT WHICH ARE SUSPICIOUS FOR INVASION.    [name]  A Collected: 04/12/2014 Client: [facility] Accession: [id number] Received: 04/12/2014 [name], MD PATHOLOGY FINAL  DIAGNOSIS Diagnosis Cervix, biopsy, interior cervix between six and nine - INVASIVE SQUAMOUS CELL CARCINOMA.     RADIOGRAPHY:  EXAM: TRANSABDOMINAL AND TRANSVAGINAL ULTRASOUND OF PELVIS  02-23-14  TECHNIQUE: Both transabdominal and transvaginal ultrasound examinations of the pelvis were performed. Transabdominal technique was performed for global imaging of the pelvis including uterus, ovaries, adnexal regions, and pelvic cul-de-sac. It was necessary to proceed with endovaginal exam following the transabdominal exam to visualize the ovaries and endometrium.  COMPARISON: None  FINDINGS: Uterus  Measurements: 9.3 x 6.2 x 9.4 cm. Heterogeneous nodular enlargement of the uterus consistent with multiple fibroids. The largest measuring 2.7 cm maximal diameter.  Endometrium  Thickness: 5.8 mm. Limited visualization due to displacement and distortion because of the uterine fibroids.  Right ovary  Measurements: 8.1 x 7 x 7.6 cm. Large cyst in the right ovary measures 5.7 x 6.4 x 6.5 cm. Single thickened septation with mild peripheral nodularity. No evidence of flow in the nodules or septation. Homogeneous anechoic appearance otherwise.  Left ovary  Not visualized. No adnexal masses seen on the left.  Other findings  No free fluid.  IMPRESSION: Multiple uterine fibroids. 6.5 cm cyst with thick septation and nonvascular mural nodules in the right ovary. This lesion is indeterminate and follow-up MRI or surgical evaluation should be considered. This recommendation follows the consensus statement: Management of Asymptomatic Ovarian and Other Adnexal Cysts Imaged at [place]: Society of Radiologists in [place]. Radiology 2010; [phone].     CT ABDOMEN AND PELVIS WITH CONTRAST   04-06-14  TECHNIQUE: Multidetector CT imaging of the abdomen and pelvis was performed using the standard protocol following bolus administration  of intravenous contrast.  CONTRAST: 172m OMNIPAQUE IOHEXOL 300 MG/ML SOLN  COMPARISON: None.  FINDINGS: Lower Chest: Unremarkable.  Hepatobiliary: No masses or other significant abnormality identified.  Pancreas: No mass, inflammatory changes, or other parenchymal abnormality identified.  Spleen: Within normal limits in size and appearance.  Adrenal Glands: No mass identified.  Kidneys/Urinary Tract: No masses identified. No evidence of hydronephrosis.  Stomach/Bowel/Peritoneum: No evidence of wall thickening, mass, or obstruction.  Vascular/Lymphatic: No pathologically enlarged lymph nodes identified. No other significant abnormality identified.  Reproductive: Several uterine myometrial masses are seen which measure up to 4 cm and are most consistent with fibroids. Another mass is seen in the region of the cervix which measures 2.9 x 3.9 cm on image 80, and this is suspicious for cervical carcinoma.  A complex cystic lesion containing thin internal septations is seen in the right adnexa and pelvic cul-de-sac which measures 5.8 x 7.6 cm. Although no definite malignant characteristics are seen, this is suspicious for a benign or low malignant potential cystic ovarian neoplasm in a postmenopausal female.  Other: None.  Musculoskeletal: No suspicious bone lesions identified.  IMPRESSION: 3.9 cm mass in the cervix, suspicious for cervical carcinoma. Other uterine myometrial masses are seen measuring up to 4 cm, most likely representing fibroids.  7.6 cm complex cystic lesion in right adnexa and cul-de-sac, suspicious for a benign or lobe malignant potential cystic ovarian neoplasm.  No evidence of lymphadenopathy, ascites, or other signs of metastatic disease.      EXAM: NUCLEAR MEDICINE PET SKULL BASE TO THIGH  05-04-2014  COMPARISON: CT abdomen pelvis 04/06/2014.  FINDINGS: NECK  No areas of abnormal  hypermetabolism.  CHEST  No areas of abnormal hypermetabolism.  ABDOMEN/PELVIS  No areas of abnormal  hypermetabolism.  SKELETON  Anterior abdominal wall foci of hypermetabolism and subcutaneous increased density including in the left side of the abdomen on image 122, midline image 127, and right-sided abdomen on image 129. These likely represent port locations.  Low-level hypermetabolism about the serosa of pelvic bowel loops. This corresponds to small volume pelvic fluid. No abdominal pelvic nodal hypermetabolism.  CT IMAGES PERFORMED FOR ATTENUATION CORRECTION  No cervical adenopathy.  Centrilobular emphysema.  Small volume abdominal ascites. Mild left adrenal thickening. Age advanced aortic atherosclerosis.  Contrast filled small bowel loops which measure up to 2.7 cm on image 142. Upper normal. Air which is presumably within the vaginal fornices on image 175 of series 4. There is more cephalad extension of gas within the right hemipelvis on image 168 of series 4. No well-defined fluid collection.  IMPRESSION: 1. Interval hysterectomy and bilateral oophorectomy. No solid hypermetabolic foci or abdominal pelvic hypermetabolic nodes to suggest metastatic disease. 2. Low-level hypermetabolism about pelvic bowel loops. Small volume abdominal pelvic fluid. Right pelvic gas which may be within the high vaginal fornix. However, extraluminal intraperitoneal air cannot be excluded. Therefore, the low-level hypermetabolism could simply be postoperative. However, peritoneal inflammation could have a similar appearance. Especially if there is a concern of postsurgical infection, consider dedicated contrast-enhanced abdominal pelvic CT. 3. Borderline dilated small bowel loops, favor postoperative adynamic ileus.    DISCUSSION: All of history above reviewed with patient and daughter now. We have discussed rationale for sensitizing chemotherapy with radiation and  reviewed oral prehydration, including giving her written directions for oral prehydration as she could not locate this in her chemo education materials. We have discussed CDDP administration, coordination with RT treatments, antiemetics, availability of assistance from this office by phone at any time. Peripheral IV access appears adequate for chemotherapy. She has physically strenuous employment, including frequent heavy lifting, and it will be best that she remain out of work until she has recovered from surgery and upcoming radiation and chemotherapy, estimating return to work ~ Feb 1. She will bring papers from her employer for me to complete. Family will transport on days of chemo.  Questions elicited and all answered.   Tobacco cessation discussed at length and encouraged     IMPRESSION / PLAN:  1. IB1 invasive squamous cell carcinoma of cervix with high risk features: will give weekly sensitizing CDDP with radiation, first treatment 06-07-14. I will see her again 12-9 coordinating with RT. 2.long and ongoing tobacco: she has already significantly decreased cigarettes and wants to DC entirely, daughter very supportive 3.unintentional weight loss of ~ 20 lbs prior to cancer diagnosis: not clear that this is related to the cancer, TSH normal 03-2014. May need [place] dietician to see. 4. HTN and elevated cholesterol recently diagnosed, being treated by PCP 5.flu vaccine done 6.overdue mammograms, which we will address as soon as possible 7.post BTL remotely     Patient and daughter have had questions answered to their satisfaction and are in agreement with plan above; verbal consent obtained. They can contact this office for questions or concerns at any time prior to next scheduled visit.  Time spent   55 min , including >50% discussion and coordination of care. Chemo orders entered. Note financial staff had been aware prior to this visit that she does not have primary  insurance.    Gordy Levan, MD 06/04/2014 12:23 PM

## 2014-06-04 NOTE — Progress Notes (Signed)
Patient education completed with patient. Gave patient "Radiation and You" booklet with all pertinent information marked and discussed, re: diarrhea/management, fatigue, pubic hair loss, nausea/management, skin irritation/care, urinary/bladder irritation/management, pain, nutrition. Patient verbalized no questions, teach back method used, patient verbalized understanding.

## 2014-06-04 NOTE — Telephone Encounter (Signed)
Per staff message and POF I have scheduled appts. Advised scheduler of appts. JMW  

## 2014-06-04 NOTE — Telephone Encounter (Signed)
, °

## 2014-06-04 NOTE — Progress Notes (Signed)
CDDP are not replaceable drugs

## 2014-06-06 ENCOUNTER — Other Ambulatory Visit: Payer: Self-pay | Admitting: Oncology

## 2014-06-07 ENCOUNTER — Ambulatory Visit: Payer: Self-pay | Admitting: Radiation Oncology

## 2014-06-07 ENCOUNTER — Ambulatory Visit: Payer: Self-pay

## 2014-06-07 ENCOUNTER — Ambulatory Visit
Admission: RE | Admit: 2014-06-07 | Discharge: 2014-06-07 | Disposition: A | Discharge status: Home / Self Care | Payer: Self-pay | Source: Ambulatory Visit | Attending: Radiation Oncology | Admitting: Radiation Oncology

## 2014-06-07 ENCOUNTER — Other Ambulatory Visit: Payer: Self-pay

## 2014-06-07 ENCOUNTER — Ambulatory Visit (HOSPITAL_BASED_OUTPATIENT_CLINIC_OR_DEPARTMENT_OTHER): Payer: Self-pay

## 2014-06-07 ENCOUNTER — Ambulatory Visit: Payer: Self-pay | Admitting: Oncology

## 2014-06-07 DIAGNOSIS — Z5111 Encounter for antineoplastic chemotherapy: Secondary | ICD-10-CM

## 2014-06-07 DIAGNOSIS — C539 Malignant neoplasm of cervix uteri, unspecified: Secondary | ICD-10-CM

## 2014-06-07 MED ORDER — DEXAMETHASONE SODIUM PHOSPHATE 20 MG/5ML IJ SOLN
12.0000 mg | Freq: Once | INTRAMUSCULAR | Status: AC
  Administered 2014-06-07: 12 mg via INTRAVENOUS

## 2014-06-07 MED ORDER — PALONOSETRON HCL INJECTION 0.25 MG/5ML
0.2500 mg | Freq: Once | INTRAVENOUS | Status: AC
  Administered 2014-06-07: 0.25 mg via INTRAVENOUS

## 2014-06-07 MED ORDER — SODIUM CHLORIDE 0.9 % IV SOLN
Freq: Once | INTRAVENOUS | Status: AC
  Administered 2014-06-07: 10:00:00 via INTRAVENOUS

## 2014-06-07 MED ORDER — DEXTROSE-NACL 5-0.45 % IV SOLN
Freq: Once | INTRAVENOUS | Status: AC
  Administered 2014-06-07: 10:00:00 via INTRAVENOUS
  Filled 2014-06-07: qty 10

## 2014-06-07 MED ORDER — SODIUM CHLORIDE 0.9 % IV SOLN
40.0000 mg/m2 | Freq: Once | INTRAVENOUS | Status: AC
  Administered 2014-06-07: 66 mg via INTRAVENOUS
  Filled 2014-06-07: qty 66

## 2014-06-07 MED ORDER — FOSAPREPITANT DIMEGLUMINE INJECTION 150 MG
150.0000 mg | Freq: Once | INTRAVENOUS | Status: AC
  Administered 2014-06-07: 150 mg via INTRAVENOUS
  Filled 2014-06-07: qty 5

## 2014-06-07 MED ORDER — PALONOSETRON HCL INJECTION 0.25 MG/5ML
INTRAVENOUS | Status: AC
  Filled 2014-06-07: qty 5

## 2014-06-07 MED ORDER — DEXAMETHASONE SODIUM PHOSPHATE 20 MG/5ML IJ SOLN
INTRAMUSCULAR | Status: AC
  Filled 2014-06-07: qty 5

## 2014-06-07 NOTE — Patient Instructions (Addendum)
[facility] Discharge Instructions for Patients Receiving Chemotherapy  Today you received the following chemotherapy agents Cisplatin.  To help prevent nausea and vomiting after your treatment, we encourage you to take your nausea medication as directed.    If you develop nausea and vomiting that is not controlled by your nausea medication, call the clinic.   BELOW ARE SYMPTOMS THAT SHOULD BE REPORTED IMMEDIATELY:  *FEVER GREATER THAN 100.5 F  *CHILLS WITH OR WITHOUT FEVER  NAUSEA AND VOMITING THAT IS NOT CONTROLLED WITH YOUR NAUSEA MEDICATION  *UNUSUAL SHORTNESS OF BREATH  *UNUSUAL BRUISING OR BLEEDING  TENDERNESS IN MOUTH AND THROAT WITH OR WITHOUT PRESENCE OF ULCERS  *URINARY PROBLEMS  *BOWEL PROBLEMS  UNUSUAL RASH Items with * indicate a potential emergency and should be followed up as soon as possible.  Feel free to call the clinic you have any questions or concerns. The clinic phone number is [phone].  *Ondansetron 8 mg every 8 hrs as needed for nausea. *Lorazepam 1 mg every 6 hrs as needed for nausea.  *Take one Lorazepam tonight and one    Cisplatin injection What is this medicine? CISPLATIN (SIS pla tin) is a chemotherapy drug. It targets fast dividing cells, like cancer cells, and causes these cells to die. This medicine is used to treat many types of cancer like bladder, ovarian, and testicular cancers. This medicine may be used for other purposes; ask your health care provider or pharmacist if you have questions. COMMON BRAND NAME(S): Platinol, Platinol -AQ What should I tell my health care provider before I take this medicine? They need to know if you have any of these conditions: -blood disorders -hearing problems -kidney disease -recent or ongoing radiation therapy -an unusual or allergic reaction to cisplatin, carboplatin, other chemotherapy, other medicines, foods, dyes, or preservatives -pregnant or trying to get  pregnant -breast-feeding How should I use this medicine? This drug is given as an infusion into a vein. It is administered in a hospital or clinic by a specially trained health care professional. Talk to your pediatrician regarding the use of this medicine in children. Special care may be needed. Overdosage: If you think you have taken too much of this medicine contact a poison control center or emergency room at once. NOTE: This medicine is only for you. Do not share this medicine with others. What if I miss a dose? It is important not to miss a dose. Call your doctor or health care professional if you are unable to keep an appointment. What may interact with this medicine? -dofetilide -foscarnet -medicines for seizures -medicines to increase blood counts like filgrastim, pegfilgrastim, sargramostim -probenecid -pyridoxine used with altretamine -rituximab -some antibiotics like amikacin, gentamicin, neomycin, polymyxin B, streptomycin, tobramycin -sulfinpyrazone -vaccines -zalcitabine Talk to your doctor or health care professional before taking any of these medicines: -acetaminophen -aspirin -ibuprofen -ketoprofen -naproxen This list may not describe all possible interactions. Give your health care provider a list of all the medicines, herbs, non-prescription drugs, or dietary supplements you use. Also tell them if you smoke, drink alcohol, or use illegal drugs. Some items may interact with your medicine. What should I watch for while using this medicine? Your condition will be monitored carefully while you are receiving this medicine. You will need important blood work done while you are taking this medicine. This drug may make you feel generally unwell. This is not uncommon, as chemotherapy can affect healthy cells as well as cancer cells. Report any side effects. Continue your course  of treatment even though you feel ill unless your doctor tells you to stop. In some cases, you may  be given additional medicines to help with side effects. Follow all directions for their use. Call your doctor or health care professional for advice if you get a fever, chills or sore throat, or other symptoms of a cold or flu. Do not treat yourself. This drug decreases your body's ability to fight infections. Try to avoid being around people who are sick. This medicine may increase your risk to bruise or bleed. Call your doctor or health care professional if you notice any unusual bleeding. Be careful brushing and flossing your teeth or using a toothpick because you may get an infection or bleed more easily. If you have any dental work done, tell your dentist you are receiving this medicine. Avoid taking products that contain aspirin, acetaminophen, ibuprofen, naproxen, or ketoprofen unless instructed by your doctor. These medicines may hide a fever. Do not become pregnant while taking this medicine. Women should inform their doctor if they wish to become pregnant or think they might be pregnant. There is a potential for serious side effects to an unborn child. Talk to your health care professional or pharmacist for more information. Do not breast-feed an infant while taking this medicine. Drink fluids as directed while you are taking this medicine. This will help protect your kidneys. Call your doctor or health care professional if you get diarrhea. Do not treat yourself. What side effects may I notice from receiving this medicine? Side effects that you should report to your doctor or health care professional as soon as possible: -allergic reactions like skin rash, itching or hives, swelling of the face, lips, or tongue -signs of infection - fever or chills, cough, sore throat, pain or difficulty passing urine -signs of decreased platelets or bleeding - bruising, pinpoint red spots on the skin, black, tarry stools, nosebleeds -signs of decreased red blood cells - unusually weak or tired, fainting  spells, lightheadedness -breathing problems -changes in hearing -gout pain -low blood counts - This drug may decrease the number of white blood cells, red blood cells and platelets. You may be at increased risk for infections and bleeding. -nausea and vomiting -pain, swelling, redness or irritation at the injection site -pain, tingling, numbness in the hands or feet -problems with balance, movement -trouble passing urine or change in the amount of urine Side effects that usually do not require medical attention (report to your doctor or health care professional if they continue or are bothersome): -changes in vision -loss of appetite -metallic taste in the mouth or changes in taste This list may not describe all possible side effects. Call your doctor for medical advice about side effects. You may report side effects to FDA at 1-800-FDA-1088. Where should I keep my medicine? This drug is given in a hospital or clinic and will not be stored at home. NOTE: This sheet is a summary. It may not cover all possible information. If you have questions about this medicine, talk to your doctor, pharmacist, or health care provider.  2015, Elsevier/Gold Standard. (2007-09-23 14:40:54)

## 2014-06-08 ENCOUNTER — Telehealth: Payer: Self-pay | Admitting: *Deleted

## 2014-06-08 ENCOUNTER — Ambulatory Visit
Admission: RE | Admit: 2014-06-08 | Discharge: 2014-06-08 | Disposition: A | Discharge status: Home / Self Care | Payer: Self-pay | Source: Ambulatory Visit | Attending: Radiation Oncology | Admitting: Radiation Oncology

## 2014-06-08 ENCOUNTER — Encounter: Payer: Self-pay | Admitting: Radiation Oncology

## 2014-06-08 VITALS — BP 134/81 | HR 83 | Resp 16 | Wt 131.8 lb

## 2014-06-08 DIAGNOSIS — C539 Malignant neoplasm of cervix uteri, unspecified: Secondary | ICD-10-CM

## 2014-06-08 NOTE — Telephone Encounter (Signed)
Left message to call Dr Livesay's nurse. 

## 2014-06-08 NOTE — Telephone Encounter (Signed)
-----   Message from Ronnette Juniper, RN sent at 06/07/2014  4:09 PM EST ----- Regarding: 1st treatment 1st treatment: Cisplatin.  Dr. Marko Plume  [phone] Viona Gilmore) [phone] Obie Dredge Jerilynn Mages)

## 2014-06-08 NOTE — Progress Notes (Signed)
Reports that she received her initial chemotherapy yesterday. Reports she was given antiemetic but, is unsure of the name. Patient plans to bring medication to next rad onc appt so it can be added to her medication list. Denies diarrhea. Reports occasional rectal pressure. Denies dysuria but, describes feeling a "different sensation when she urinates." Denies vaginal odor or discharge. Denies vaginal itching. Weight and vitals stable.

## 2014-06-08 NOTE — Telephone Encounter (Signed)
Called patient back - she said she is feeling fine. Took ativan tablet last night and slept great. Pt denies any nausea or vomiting. Encouraged pt to continue to drink lots of decaffeinated fluids. Pt coming tomorrow at 8am for MD visit - pt agreeable to come at scheduled time.

## 2014-06-08 NOTE — Progress Notes (Signed)
  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 . . . . . . . The patient presents for routine under treatment assessment.                                   The patient is without complaint. She received radiosensitizing chemotherapy yesterday and tolerated this well                                 Set-up films were reviewed.                                 The chart was checked. Physical Findings. . .  weight is 131 lb 12.8 oz (59.784 kg). Her blood pressure is 134/81 and her pulse is 83. Her respiration is 16. . Lungs are clear. The heart has a regular rhythm and rate. The abdomen is soft and nontender with normal bowel sounds. Impression . . . . . . . The patient is tolerating radiation. Plan . . . . . . . . . . . . Continue treatment as planned.  ________________________________   Blair Promise, PhD, MD

## 2014-06-09 ENCOUNTER — Ambulatory Visit (HOSPITAL_BASED_OUTPATIENT_CLINIC_OR_DEPARTMENT_OTHER): Payer: Self-pay | Admitting: Oncology

## 2014-06-09 ENCOUNTER — Ambulatory Visit
Admission: RE | Admit: 2014-06-09 | Discharge: 2014-06-09 | Disposition: A | Discharge status: Home / Self Care | Payer: Self-pay | Source: Ambulatory Visit | Attending: Radiation Oncology | Admitting: Radiation Oncology

## 2014-06-09 ENCOUNTER — Encounter: Payer: Self-pay | Admitting: Oncology

## 2014-06-09 VITALS — BP 121/78 | HR 70 | Temp 97.8°F | Resp 18 | Ht 67.0 in | Wt 132.0 lb

## 2014-06-09 DIAGNOSIS — I10 Essential (primary) hypertension: Secondary | ICD-10-CM

## 2014-06-09 DIAGNOSIS — Z720 Tobacco use: Secondary | ICD-10-CM

## 2014-06-09 DIAGNOSIS — C539 Malignant neoplasm of cervix uteri, unspecified: Secondary | ICD-10-CM

## 2014-06-09 MED ORDER — LORAZEPAM 1 MG PO TABS: ORAL_TABLET | ORAL | Status: DC

## 2014-06-09 MED ORDER — ONDANSETRON HCL 8 MG PO TABS: 8.0000 mg | ORAL_TABLET | Freq: Three times a day (TID) | ORAL | Status: DC | PRN

## 2014-06-09 NOTE — Progress Notes (Signed)
OFFICE PROGRESS NOTE   06/09/2014   Physicians:Emma Rossi/ [name], [name], [name] (PCP, [facility])  INTERVAL HISTORY:  Patient is seen, alone for visit, in follow up of first sensitizing CDDP chemotherapy given 06-07-14 with radiation in process for IB1 squamous cell carcinoma of cervix. She has done well with treatment thus far.  Patient denies any nausea and has had no vomiting. She denies vaginal bleeding (had denied that also at presentation). She has slept better with ativan at hs than she has in years, up x 1 last pm instead of multiple times nightly. Discussed dependence on ativan if used very regularly long term, but certainly during chemo we prefer she not be very sleep deprived. She is still slightly constipated, tho bowels are moving daily; she can increase stool softener to bid and can add miralax prn. She tried nicotine gum, discussed best use of that; she smoked < 1/2 of 1 cigarette this AM. She is "losing taste" for coffee, discussed caffeine withdrawal HA. Energy is good.  Peripheral IV access was easily accomplished for chemo. Flu vaccine done.  Daughter works with [employer], from home.   ONCOLOGIC HISTORY Patient presented to ED with 1 year of vaginal discharge/ dark spotting 02-22-14, Trichomonas +. US showed multiple uterine fibroids and 6.5 cm cyst in right ovary with thickened septation and peripheral nodularity. 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 Exam 04-01-14 was remarkable for cervix hard, friable, irregular and PAP ([id number]) had CIN-3/CIS with suspicion for invasion. CT AP 04-06-14: 3.9 cm mass in the cervix, suspicious for cervical carcinoma; other uterine myometrial masses measuring up to 4 cm, most likely representing fibroids,7.6 cm complex cystic lesion in right adnexa and  cul-de-sac. She saw Dr Skeet Latch 04-12-14, with cervical biopsy ([id number] ) documenting invasive squamous cell carcinoma. She went to surgery at [facility] 04-21-14 by Dr Skeet Latch, this robotic radical hysterectomy with upper vaginectomy, BSO and bilateral pelvic lymphadenectomy, with barrel shaped cervix, 2 cm lesion on cervix, uterus slightly enlarged and large right ovarian cyst. .Pathology from [facility] 04-21-14 [phone]) cervix with moderately to poorly differentiated squamous cell carcinoma, circumferential within cervix and involving anterior and posterior lower uterine segments and endometrium, invading into myometrium and extensively into bilateral parametrial soft tissue. Perineural and LVSI present as well as large vessel involvement. Surgical margins 3 mm from anterior deep cervical stromal margin and vaginal cuff widely negative. * left pelvic nodes, 3 right pelvic nodes, 2 left obturator nodes and 5 parametrial nodes all negative. Right ovary and tube with serous cystadenoma. Adenomatous endometrial polyp with ulceration, also involved by squamous cell carcinoma. Pelvic washings ([id number]) no malignant cells identified. Staging was IB1.  [facility] multidisciplinary gyn oncology conference 04-28-14 recommended whole pelvic radiation with sensitizing CDDP and suggested consideration of PET CT to evaluate peri-aortic involvement given small number of nodes sampled on right. Patient had post op follow up with Dr Denman George in [place] on 04-29-14, foley removed then. She had ER visit on 10-30 due to constipation from pain medication. PET 05-04-14 showed no abdominal or pelvic hypermetabolic nodes or other evidence of metastatic disease, tho some low level hypermetabolism around pelvic bowel loops and small volume ascites thought post op or inflammation, and borderline dilated small bowel loops thought post op adynamic ileus.  She began IMRT by Dr Sondra Come on 06-03-14 (?), this scheduled thru 07-08-13. First weekly CDDP  was  given 06-07-14.    Review of systems as above, also: No other bleeding. No increased SOB or cough. No hair loss. No LE swelling. Bladder ok. Did fine with prehydration and IV hydration for CDDP. Remainder of 10 point Review of Systems negative.  Objective:  Vital signs in last 24 hours: Weight up 4 lbs to almost 133. 121/78.79, regular. 18 not labored RA. 97.8, O2 sat 100%.  Alert, oriented and appropriate. Ambulatory without difficulty. Looks comfortable, respirations not labored.  No alopecia. Very pleasant  HEENT:PERRL, sclerae not icteric. Oral mucosa moist without lesions, posterior pharynx clear.  Neck supple. No JVD.  Lymphatics:no cervical,supraclavicular adenopathy Resp: clear to auscultation bilaterally and normal percussion bilaterally Cardio: regular rate and rhythm. No gallop. GI: soft, nontender, not distended, no mass or organomegaly. Normally active bowel sounds. Robotic surgical incisions not remarkable. Musculoskeletal/ Extremities: without pitting edema, cords, tenderness Neuro: no peripheral neuropathy. Otherwise nonfocal Skin without rash, ecchymosis, petechiae. No findings at site of chemo IV left wrist ventral.   Lab Results: Labs to be repeated day of chemo Results for orders placed or performed in visit on 06/04/14  CBC with Differential  Result Value Ref Range   WBC 4.5 3.9 - 10.3 10e3/uL   NEUT# 2.8 1.5 - 6.5 10e3/uL   HGB 12.7 11.6 - 15.9 g/dL   HCT 38.7 34.8 - 46.6 %   Platelets 229 145 - 400 10e3/uL   MCV 80.1 79.5 - 101.0 fL   MCH 26.3 25.1 - 34.0 pg   MCHC 32.8 31.5 - 36.0 g/dL   RBC 4.83 3.70 - 5.45 10e6/uL   RDW 15.9 (H) 11.2 - 14.5 %   lymph# 1.4 0.9 - 3.3 10e3/uL   MONO# 0.3 0.1 - 0.9 10e3/uL   Eosinophils Absolute 0.1 0.0 - 0.5 10e3/uL   Basophils Absolute 0.0 0.0 - 0.1 10e3/uL   NEUT% 62.3 38.4 - 76.8 %   LYMPH% 30.4 14.0 - 49.7 %   MONO% 6.0 0.0 - 14.0 %   EOS% 1.1 0.0 - 7.0 %   BASO% 0.2 0.0 - 2.0 %   nRBC 0 0 - 0 %   Comprehensive metabolic panel (Cmet) - [facility]  Result Value Ref Range   Sodium 143 136 - 145 mEq/L   Potassium 4.3 3.5 - 5.1 mEq/L   Chloride 103 98 - 109 mEq/L   CO2 29 22 - 29 mEq/L   Glucose 90 70 - 140 mg/dl   BUN 8.8 7.0 - 26.0 mg/dL   Creatinine 0.8 0.6 - 1.1 mg/dL   Total Bilirubin 0.32 0.20 - 1.20 mg/dL   Alkaline Phosphatase 86 40 - 150 U/L   AST 20 5 - 34 U/L   ALT 23 0 - 55 U/L   Total Protein 7.7 6.4 - 8.3 g/dL   Albumin 4.0 3.5 - 5.0 g/dL   Calcium 10.2 8.4 - 10.4 mg/dL   Anion Gap 11 3 - 11 mEq/L   EGFR >90 >90 ml/min/1.73 m2  Iron and TIBC  Result Value Ref Range   Iron 81 41 - 142 ug/dL   TIBC 289 236 - 444 ug/dL   UIBC 208 120 - 384 ug/dL   %SAT 28 21 - 57 %  Magnesium  Result Value Ref Range   Magnesium 2.4 1.5 - 2.5 mg/dl     Studies/Results:  No results found.  Medications: I have reviewed the patient's current medications. Colace and/or miralax prn daily, stop if diarrhea from RT. Nicotine gum correct use as  discussed. Can try 1/2 ativan or use full tablet most nights, but try not to use every night.  DISCUSSION: as in interval history above  Assessment/Plan: 1. IB1 invasive squamous cell carcinoma of cervix with high risk features: will give weekly sensitizing CDDP with radiation, first treatment 06-07-14. Continue weekly chemo scheduled 06-14-14 thru 07-05-14, with cbc cmet Mg day of each treatment, RT scheduled thru 07-08-14. I will see her at least with Rx 1-21 and on 12-30. 2.long and ongoing tobacco: she has already significantly decreased cigarettes and wants to DC entirely. Nicotine gum as above. 3.unintentional weight loss of ~ 20 lbs prior to cancer diagnosis: not clear that this is related to the cancer, TSH normal 03-2014. May need [place] dietician to see. Weight up in past week, possibly with increased water/ less caffeine. 4. HTN and elevated cholesterol recently diagnosed, being treated by PCP 5.flu vaccine done 6.overdue mammograms, which we  will address as soon as possible 7.post BTL remotely   All questions answered. Chemo orders confirmed. She knows to call prior to next scheduled visit if needed. TIme spent 25 min including >50% counseling and coordination of care.   LIVESAY,LENNIS P, MD   06/09/2014, 8:28 AM

## 2014-06-10 ENCOUNTER — Ambulatory Visit
Admission: RE | Admit: 2014-06-10 | Discharge: 2014-06-10 | Disposition: A | Discharge status: Home / Self Care | Payer: Self-pay | Source: Ambulatory Visit | Attending: Radiation Oncology | Admitting: Radiation Oncology

## 2014-06-11 ENCOUNTER — Ambulatory Visit
Admission: RE | Admit: 2014-06-11 | Discharge: 2014-06-11 | Disposition: A | Discharge status: Home / Self Care | Payer: Self-pay | Source: Ambulatory Visit | Attending: Radiation Oncology | Admitting: Radiation Oncology

## 2014-06-13 ENCOUNTER — Other Ambulatory Visit: Payer: Self-pay | Admitting: Oncology

## 2014-06-13 DIAGNOSIS — C539 Malignant neoplasm of cervix uteri, unspecified: Secondary | ICD-10-CM

## 2014-06-14 ENCOUNTER — Ambulatory Visit (HOSPITAL_BASED_OUTPATIENT_CLINIC_OR_DEPARTMENT_OTHER): Payer: Self-pay

## 2014-06-14 ENCOUNTER — Ambulatory Visit
Admission: RE | Admit: 2014-06-14 | Discharge: 2014-06-14 | Disposition: A | Discharge status: Home / Self Care | Payer: Self-pay | Source: Ambulatory Visit | Attending: Radiation Oncology | Admitting: Radiation Oncology

## 2014-06-14 DIAGNOSIS — C539 Malignant neoplasm of cervix uteri, unspecified: Secondary | ICD-10-CM

## 2014-06-14 DIAGNOSIS — Z5111 Encounter for antineoplastic chemotherapy: Secondary | ICD-10-CM

## 2014-06-14 LAB — CBC WITH DIFFERENTIAL/PLATELET
BASO%: 0.3 % (ref 0.0–2.0)
BASOS ABS: 0 10*3/uL (ref 0.0–0.1)
EOS%: 1.8 % (ref 0.0–7.0)
Eosinophils Absolute: 0.1 10*3/uL (ref 0.0–0.5)
HCT: 39.3 % (ref 34.8–46.6)
HEMOGLOBIN: 12.9 g/dL (ref 11.6–15.9)
LYMPH#: 0.7 10*3/uL — AB (ref 0.9–3.3)
LYMPH%: 21.2 % (ref 14.0–49.7)
MCH: 26.2 pg (ref 25.1–34.0)
MCHC: 32.8 g/dL (ref 31.5–36.0)
MCV: 79.9 fL (ref 79.5–101.0)
MONO#: 0.2 10*3/uL (ref 0.1–0.9)
MONO%: 6 % (ref 0.0–14.0)
NEUT#: 2.4 10*3/uL (ref 1.5–6.5)
NEUT%: 70.7 % (ref 38.4–76.8)
Platelets: 281 10*3/uL (ref 145–400)
RBC: 4.92 10*6/uL (ref 3.70–5.45)
RDW: 15.3 % — ABNORMAL HIGH (ref 11.2–14.5)
WBC: 3.4 10*3/uL — ABNORMAL LOW (ref 3.9–10.3)
nRBC: 0 % (ref 0–0)

## 2014-06-14 LAB — COMPREHENSIVE METABOLIC PANEL (CC13)
ALK PHOS: 82 U/L (ref 40–150)
ALT: 26 U/L (ref 0–55)
AST: 19 U/L (ref 5–34)
Albumin: 4.1 g/dL (ref 3.5–5.0)
Anion Gap: 10 mEq/L (ref 3–11)
BUN: 11.4 mg/dL (ref 7.0–26.0)
CALCIUM: 9.6 mg/dL (ref 8.4–10.4)
CO2: 27 mEq/L (ref 22–29)
CREATININE: 0.8 mg/dL (ref 0.6–1.1)
Chloride: 104 mEq/L (ref 98–109)
GLUCOSE: 93 mg/dL (ref 70–140)
POTASSIUM: 4.1 meq/L (ref 3.5–5.1)
Sodium: 142 mEq/L (ref 136–145)
Total Bilirubin: 0.25 mg/dL (ref 0.20–1.20)
Total Protein: 7.7 g/dL (ref 6.4–8.3)

## 2014-06-14 LAB — MAGNESIUM (CC13): MAGNESIUM: 2.6 mg/dL — AB (ref 1.5–2.5)

## 2014-06-14 MED ORDER — SODIUM CHLORIDE 0.9 % IV SOLN
150.0000 mg | Freq: Once | INTRAVENOUS | Status: AC
  Administered 2014-06-14: 150 mg via INTRAVENOUS
  Filled 2014-06-14: qty 5

## 2014-06-14 MED ORDER — SODIUM CHLORIDE 0.9 % IV SOLN
40.0000 mg/m2 | Freq: Once | INTRAVENOUS | Status: AC
  Administered 2014-06-14: 66 mg via INTRAVENOUS
  Filled 2014-06-14: qty 66

## 2014-06-14 MED ORDER — PALONOSETRON HCL INJECTION 0.25 MG/5ML
0.2500 mg | Freq: Once | INTRAVENOUS | Status: AC
  Administered 2014-06-14: 0.25 mg via INTRAVENOUS

## 2014-06-14 MED ORDER — DEXAMETHASONE SODIUM PHOSPHATE 20 MG/5ML IJ SOLN
INTRAMUSCULAR | Status: AC
  Filled 2014-06-14: qty 5

## 2014-06-14 MED ORDER — PALONOSETRON HCL INJECTION 0.25 MG/5ML
INTRAVENOUS | Status: AC
  Filled 2014-06-14: qty 5

## 2014-06-14 MED ORDER — SODIUM CHLORIDE 0.9 % IV SOLN
Freq: Once | INTRAVENOUS | Status: AC
  Administered 2014-06-14: 11:00:00 via INTRAVENOUS

## 2014-06-14 MED ORDER — DEXAMETHASONE SODIUM PHOSPHATE 20 MG/5ML IJ SOLN
12.0000 mg | Freq: Once | INTRAMUSCULAR | Status: AC
  Administered 2014-06-14: 12 mg via INTRAVENOUS

## 2014-06-14 MED ORDER — POTASSIUM CHLORIDE 2 MEQ/ML IV SOLN
Freq: Once | INTRAVENOUS | Status: AC
  Administered 2014-06-14: 11:00:00 via INTRAVENOUS
  Filled 2014-06-14: qty 10

## 2014-06-14 NOTE — Progress Notes (Signed)
[employer] claim form and [employer] form given to [place] in managed care.

## 2014-06-14 NOTE — Patient Instructions (Signed)
[facility] Discharge Instructions for Patients Receiving Chemotherapy  Today you received the following chemotherapy agents: Cisplatin.  To help prevent nausea and vomiting after your treatment, we encourage you to take your nausea medication: Lorazepam. Take half tablet every 6 hours as needed. For nausea on or after 12/17: Take Ondansetron.    If you develop nausea and vomiting that is not controlled by your nausea medication, call the clinic.   BELOW ARE SYMPTOMS THAT SHOULD BE REPORTED IMMEDIATELY:  *FEVER GREATER THAN 100.5 F  *CHILLS WITH OR WITHOUT FEVER  NAUSEA AND VOMITING THAT IS NOT CONTROLLED WITH YOUR NAUSEA MEDICATION  *UNUSUAL SHORTNESS OF BREATH  *UNUSUAL BRUISING OR BLEEDING  TENDERNESS IN MOUTH AND THROAT WITH OR WITHOUT PRESENCE OF ULCERS  *URINARY PROBLEMS  *BOWEL PROBLEMS  UNUSUAL RASH Items with * indicate a potential emergency and should be followed up as soon as possible.  Feel free to call the clinic you have any questions or concerns. The clinic phone number is [phone].

## 2014-06-15 ENCOUNTER — Ambulatory Visit
Admission: RE | Admit: 2014-06-15 | Discharge: 2014-06-15 | Disposition: A | Discharge status: Home / Self Care | Payer: Self-pay | Source: Ambulatory Visit | Attending: Radiation Oncology | Admitting: Radiation Oncology

## 2014-06-15 ENCOUNTER — Encounter: Payer: Self-pay | Admitting: Radiation Oncology

## 2014-06-15 ENCOUNTER — Encounter: Payer: Self-pay | Admitting: Oncology

## 2014-06-15 VITALS — BP 123/92 | HR 81 | Temp 98.0°F | Resp 16 | Ht 67.0 in | Wt 128.6 lb

## 2014-06-15 DIAGNOSIS — C539 Malignant neoplasm of cervix uteri, unspecified: Secondary | ICD-10-CM

## 2014-06-15 NOTE — Progress Notes (Signed)
  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 . . . . . . . The patient presents for routine under treatment assessment.                                   The patient is without complaint. She continues to have no side effects from her pelvic radiation therapy.                                 Set-up films were reviewed.                                 The chart was checked. Physical Findings. . .  height is 5\' 7"  (1.702 m) and weight is 128 lb 9.6 oz (58.333 kg). Her oral temperature is 98 F (36.7 C). Her blood pressure is 123/92 and her pulse is 81. Her respiration is 16. . The lungs are clear. The heart has a regular rhythm and rate. The abdomen is soft and nontender with normal bowel sounds. Impression . . . . . . . The patient is tolerating radiation. Plan . . . . . . . . . . . . Continue treatment as planned.  ________________________________   Blair Promise, PhD, MD

## 2014-06-15 NOTE — Progress Notes (Signed)
Martha Koch has completed 10 fractions to her pelvis.  She denies pain.  She denies nausea, diarrhea, bladder issues and vaginal/rectal bleeding.  She had chemotherapy yesterday.  She denies fatigue.

## 2014-06-15 NOTE — Progress Notes (Signed)
Put 2 disability forms on nurse's desk. °

## 2014-06-16 ENCOUNTER — Ambulatory Visit
Admission: RE | Admit: 2014-06-16 | Discharge: 2014-06-16 | Disposition: A | Discharge status: Home / Self Care | Payer: Self-pay | Source: Ambulatory Visit | Attending: Radiation Oncology | Admitting: Radiation Oncology

## 2014-06-17 ENCOUNTER — Ambulatory Visit
Admission: RE | Admit: 2014-06-17 | Discharge: 2014-06-17 | Disposition: A | Discharge status: Home / Self Care | Payer: Self-pay | Source: Ambulatory Visit | Attending: Radiation Oncology | Admitting: Radiation Oncology

## 2014-06-18 ENCOUNTER — Ambulatory Visit
Admission: RE | Admit: 2014-06-18 | Discharge: 2014-06-18 | Disposition: A | Discharge status: Home / Self Care | Payer: Self-pay | Source: Ambulatory Visit | Attending: Radiation Oncology | Admitting: Radiation Oncology

## 2014-06-18 ENCOUNTER — Encounter: Payer: Self-pay | Admitting: *Deleted

## 2014-06-18 NOTE — Progress Notes (Signed)
Pt received completed disability forms and I will fax them to the appropriate destination.

## 2014-06-21 ENCOUNTER — Ambulatory Visit (HOSPITAL_BASED_OUTPATIENT_CLINIC_OR_DEPARTMENT_OTHER): Payer: Self-pay

## 2014-06-21 ENCOUNTER — Encounter: Payer: Self-pay | Admitting: Oncology

## 2014-06-21 ENCOUNTER — Telehealth: Payer: Self-pay | Admitting: Oncology

## 2014-06-21 ENCOUNTER — Ambulatory Visit
Admission: RE | Admit: 2014-06-21 | Discharge: 2014-06-21 | Disposition: A | Discharge status: Home / Self Care | Payer: Self-pay | Source: Ambulatory Visit | Attending: Radiation Oncology | Admitting: Radiation Oncology

## 2014-06-21 ENCOUNTER — Ambulatory Visit (HOSPITAL_BASED_OUTPATIENT_CLINIC_OR_DEPARTMENT_OTHER): Payer: Self-pay | Admitting: Lab

## 2014-06-21 ENCOUNTER — Ambulatory Visit (HOSPITAL_BASED_OUTPATIENT_CLINIC_OR_DEPARTMENT_OTHER): Payer: Self-pay | Admitting: Oncology

## 2014-06-21 DIAGNOSIS — C539 Malignant neoplasm of cervix uteri, unspecified: Secondary | ICD-10-CM

## 2014-06-21 DIAGNOSIS — Z5111 Encounter for antineoplastic chemotherapy: Secondary | ICD-10-CM

## 2014-06-21 LAB — CBC WITH DIFFERENTIAL/PLATELET
BASO%: 0.8 % (ref 0.0–2.0)
Basophils Absolute: 0 10*3/uL (ref 0.0–0.1)
EOS ABS: 0.1 10*3/uL (ref 0.0–0.5)
EOS%: 4.1 % (ref 0.0–7.0)
HEMATOCRIT: 38.5 % (ref 34.8–46.6)
HGB: 12.1 g/dL (ref 11.6–15.9)
LYMPH%: 14.6 % (ref 14.0–49.7)
MCH: 25.6 pg (ref 25.1–34.0)
MCHC: 31.4 g/dL — ABNORMAL LOW (ref 31.5–36.0)
MCV: 81.6 fL (ref 79.5–101.0)
MONO#: 0.2 10*3/uL (ref 0.1–0.9)
MONO%: 6.6 % (ref 0.0–14.0)
NEUT#: 2.6 10*3/uL (ref 1.5–6.5)
NEUT%: 73.9 % (ref 38.4–76.8)
PLATELETS: 224 10*3/uL (ref 145–400)
RBC: 4.71 10*6/uL (ref 3.70–5.45)
RDW: 15.3 % — AB (ref 11.2–14.5)
WBC: 3.6 10*3/uL — ABNORMAL LOW (ref 3.9–10.3)
lymph#: 0.5 10*3/uL — ABNORMAL LOW (ref 0.9–3.3)

## 2014-06-21 LAB — COMPREHENSIVE METABOLIC PANEL (CC13)
ALK PHOS: 63 U/L (ref 40–150)
ALT: 19 U/L (ref 0–55)
ANION GAP: 11 meq/L (ref 3–11)
AST: 15 U/L (ref 5–34)
Albumin: 3.8 g/dL (ref 3.5–5.0)
BUN: 11.9 mg/dL (ref 7.0–26.0)
CO2: 26 meq/L (ref 22–29)
Calcium: 9.2 mg/dL (ref 8.4–10.4)
Chloride: 104 mEq/L (ref 98–109)
Creatinine: 0.8 mg/dL (ref 0.6–1.1)
EGFR: >90 mL/min/{1.73_m2} (ref >90)
Glucose: 62 mg/dl — ABNORMAL LOW (ref 70–140)
Potassium: 3.8 mEq/L (ref 3.5–5.1)
Sodium: 141 mEq/L (ref 136–145)
Total Protein: 7.1 g/dL (ref 6.4–8.3)

## 2014-06-21 LAB — MAGNESIUM (CC13): Magnesium: 2.6 mg/dl — ABNORMAL HIGH (ref 1.5–2.5)

## 2014-06-21 MED ORDER — POTASSIUM CHLORIDE 2 MEQ/ML IV SOLN
Freq: Once | INTRAVENOUS | Status: AC
  Administered 2014-06-21: 10:00:00 via INTRAVENOUS
  Filled 2014-06-21: qty 10

## 2014-06-21 MED ORDER — SODIUM CHLORIDE 0.9 % IV SOLN
40.0000 mg/m2 | Freq: Once | INTRAVENOUS | Status: AC
  Administered 2014-06-21: 66 mg via INTRAVENOUS
  Filled 2014-06-21: qty 66

## 2014-06-21 MED ORDER — SODIUM CHLORIDE 0.9 % IV SOLN
150.0000 mg | Freq: Once | INTRAVENOUS | Status: AC
  Administered 2014-06-21: 150 mg via INTRAVENOUS
  Filled 2014-06-21: qty 5

## 2014-06-21 MED ORDER — DEXAMETHASONE SODIUM PHOSPHATE 20 MG/5ML IJ SOLN
INTRAMUSCULAR | Status: AC
  Filled 2014-06-21: qty 5

## 2014-06-21 MED ORDER — PALONOSETRON HCL INJECTION 0.25 MG/5ML
INTRAVENOUS | Status: AC
  Filled 2014-06-21: qty 5

## 2014-06-21 MED ORDER — DEXAMETHASONE SODIUM PHOSPHATE 20 MG/5ML IJ SOLN
12.0000 mg | Freq: Once | INTRAMUSCULAR | Status: AC
  Administered 2014-06-21: 12 mg via INTRAVENOUS

## 2014-06-21 MED ORDER — PALONOSETRON HCL INJECTION 0.25 MG/5ML
0.2500 mg | Freq: Once | INTRAVENOUS | Status: AC
  Administered 2014-06-21: 0.25 mg via INTRAVENOUS

## 2014-06-21 MED ORDER — SODIUM CHLORIDE 0.9 % IV SOLN
Freq: Once | INTRAVENOUS | Status: AC
  Administered 2014-06-21: 10:00:00 via INTRAVENOUS

## 2014-06-21 NOTE — Progress Notes (Signed)
1525 Removal of PIV, some mild edema, approx 2 inches x 2 inches noted to site. No redness, no discoloration, cool to touch, patient did not complain of any burning or irritation during infusion. Patient states no pain just some mild tender approx 1/2 inch above insertion point. Prior to start of cisplatin, blood return checked by two nurses, 2nd nurse Abelina Bachelor, and will great blood return. Cool compress placed to site for 10 minutes.   1550 PIV site wrapped in gauze and coband, patient educated on symptoms to watch for. Patient knows to return to clinic should site change in color, increase in size, itching, pain or develops new symptoms. Patient gave verbal understanding.

## 2014-06-21 NOTE — Patient Instructions (Signed)
[facility] Discharge Instructions for Patients Receiving Chemotherapy  Today you received the following chemotherapy agent Cisplatin.  To help prevent nausea and vomiting after your treatment, we encourage you to take your nausea medication.   If you develop nausea and vomiting that is not controlled by your nausea medication, call the clinic.   BELOW ARE SYMPTOMS THAT SHOULD BE REPORTED IMMEDIATELY:  *FEVER GREATER THAN 100.5 F  *CHILLS WITH OR WITHOUT FEVER  NAUSEA AND VOMITING THAT IS NOT CONTROLLED WITH YOUR NAUSEA MEDICATION  *UNUSUAL SHORTNESS OF BREATH  *UNUSUAL BRUISING OR BLEEDING  TENDERNESS IN MOUTH AND THROAT WITH OR WITHOUT PRESENCE OF ULCERS  *URINARY PROBLEMS  *BOWEL PROBLEMS  UNUSUAL RASH Items with * indicate a potential emergency and should be followed up as soon as possible.  Feel free to call the clinic you have any questions or concerns. The clinic phone number is [phone].

## 2014-06-21 NOTE — Telephone Encounter (Signed)
, °

## 2014-06-21 NOTE — Progress Notes (Signed)
OFFICE PROGRESS NOTE   06/21/2014   Physicians:Emma Rossi/ [name], [name], [name] (PCP, [facility])  INTERVAL HISTORY:  Patient is seen in infusion area, receiving third weekly sensitizing CDDP chemotherapy with radiation for IB1 squamous cell carcinoma of cervix. Radiation is now scheduled thru 07-12-14.  Patient is tolerating treatment well overall. She has slight local discomfort with urination and should begin sitz baths; I have spoken with Rad Onc to obtain sitz bath. She is a little more fatigued, still not sleeping unless she uses ativan at hs, including not sleeping last pm at all as she did not take ativan then. She has some taste changes, but continues to eat and feels weight is ~ stable. She has had no nausea or vomiting. She denies bleeding or pain. Bowels are moving regularly without diarrhea.  Peripheral IV access was easily accomplished for chemo. Flu vaccine done.   ONCOLOGIC HISTORY Patient presented to ED with 1 year of vaginal discharge/ dark spotting 02-22-14, Trichomonas +. US showed multiple uterine fibroids and 6.5 cm cyst in right ovary with thickened septation and peripheral nodularity. 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 Exam 04-01-14 was remarkable for cervix hard, friable, irregular and PAP ([id number]) had CIN-3/CIS with suspicion for invasion. CT AP 04-06-14: 3.9 cm mass in the cervix, suspicious for cervical carcinoma; other uterine myometrial masses measuring up to 4 cm, most likely representing fibroids,7.6 cm complex cystic lesion in right adnexa and cul-de-sac. She saw Dr Skeet Latch 04-12-14, with cervical biopsy ([id number] ) documenting invasive squamous cell carcinoma. She went to surgery at [facility] 04-21-14 by Dr Skeet Latch, this robotic radical hysterectomy with upper vaginectomy, BSO and  bilateral pelvic lymphadenectomy, with barrel shaped cervix, 2 cm lesion on cervix, uterus slightly enlarged and large right ovarian cyst. .Pathology from [facility] 04-21-14 [id number]) cervix with moderately to poorly differentiated squamous cell carcinoma, circumferential within cervix and involving anterior and posterior lower uterine segments and endometrium, invading into myometrium and extensively into bilateral parametrial soft tissue. Perineural and LVSI present as well as large vessel involvement. Surgical margins 3 mm from anterior deep cervical stromal margin and vaginal cuff widely negative. * left pelvic nodes, 3 right pelvic nodes, 2 left obturator nodes and 5 parametrial nodes all negative. Right ovary and tube with serous cystadenoma. Adenomatous endometrial polyp with ulceration, also involved by squamous cell carcinoma. Pelvic washings ([id number]) no malignant cells identified. Staging was IB1.  [facility] multidisciplinary gyn oncology conference 04-28-14 recommended whole pelvic radiation with sensitizing CDDP and suggested consideration of PET CT to evaluate peri-aortic involvement given small number of nodes sampled on right. Patient had post op follow up with Dr Denman George in [place] on 04-29-14, foley removed then. She had ER visit on 10-30 due to constipation from pain medication. PET 05-04-14 showed no abdominal or pelvic hypermetabolic nodes or other evidence of metastatic disease, tho some low level hypermetabolism around pelvic bowel loops and small volume ascites thought post op or inflammation, and borderline dilated small bowel loops thought post op adynamic ileus.  She began IMRT by Dr Sondra Come on 06-03-14 (?), this scheduled thru 07-12-13. First weekly CDDP was given 06-07-14.   Review of systems as above, also: Peripheral IV access with 2 tries today. No problems doing oral prehydration. No SOB. No LE swelling. No alopecia Remainder of 10 point Review of Systems  negative.  Objective:  Vital signs  in last 24 hours: Last weight 128.9 on 06-15-14, down from 131-132 week prior. Temp 97.2, HR 78 regular, respirations not labored RA at 17, BP 133/73.   Alert, oriented and appropriate. Ambulatory without difficulty coming into infusion, comfortable seated in chair on RA now. No alopecia  HEENT:PERRL, sclerae not icteric. Oral mucosa moist without lesions Neck supple. No JVD.  Lymphatics:no cervical,supraclavicular adenopathy Resp: clear to auscultation bilaterally  Cardio: regular rate and rhythm. No gallop. GI: soft, nontender, not distended, no mass or organomegaly. Normally active bowel sounds.  Musculoskeletal/ Extremities: without pitting edema, cords, tenderness Neuro: no peripheral neuropathy. Otherwise nonfocal Skin without rash, ecchymosis, petechiae. Peripheral IV right forearm infusing without difficulty   Lab Results:  Results for orders placed or performed in visit on 06/21/14  CBC with Differential  Result Value Ref Range   WBC 3.6 (L) 3.9 - 10.3 10e3/uL   NEUT# 2.6 1.5 - 6.5 10e3/uL   HGB 12.1 11.6 - 15.9 g/dL   HCT 38.5 34.8 - 46.6 %   Platelets 224 145 - 400 10e3/uL   MCV 81.6 79.5 - 101.0 fL   MCH 25.6 25.1 - 34.0 pg   MCHC 31.4 (L) 31.5 - 36.0 g/dL   RBC 4.71 3.70 - 5.45 10e6/uL   RDW 15.3 (H) 11.2 - 14.5 %   lymph# 0.5 (L) 0.9 - 3.3 10e3/uL   MONO# 0.2 0.1 - 0.9 10e3/uL   Eosinophils Absolute 0.1 0.0 - 0.5 10e3/uL   Basophils Absolute 0.0 0.0 - 0.1 10e3/uL   NEUT% 73.9 38.4 - 76.8 %   LYMPH% 14.6 14.0 - 49.7 %   MONO% 6.6 0.0 - 14.0 %   EOS% 4.1 0.0 - 7.0 %   BASO% 0.8 0.0 - 2.0 %  Comprehensive metabolic panel (Cmet) - [facility]  Result Value Ref Range   Sodium 141 136 - 145 mEq/L   Potassium 3.8 3.5 - 5.1 mEq/L   Chloride 104 98 - 109 mEq/L   CO2 26 22 - 29 mEq/L   Glucose 62 (L) 70 - 140 mg/dl   BUN 11.9 7.0 - 26.0 mg/dL   Creatinine 0.8 0.6 - 1.1 mg/dL   Total Bilirubin <0.20 0.20 - 1.20 mg/dL   Alkaline  Phosphatase 63 40 - 150 U/L   AST 15 5 - 34 U/L   ALT 19 0 - 55 U/L   Total Protein 7.1 6.4 - 8.3 g/dL   Albumin 3.8 3.5 - 5.0 g/dL   Calcium 9.2 8.4 - 10.4 mg/dL   Anion Gap 11 3 - 11 mEq/L   EGFR >90 >90 ml/min/1.73 m2  Magnesium  Result Value Ref Range   Magnesium 2.6 (H) 1.5 - 2.5 mg/dl    CBC reviewed with patient, CMET and Mg reviewed with RN. She has eaten since blood drawn (glucose 62) Studies/Results:  No results found.  Medications: I have reviewed the patient's current medications. No changes  DISCUSSION: will continue weekly CDDP thru Jan 4 to coordinate with RT schedule. Discussed using sitz bath. Discussed slowing activity if fatigued. Patient is pleased with how she is tolerating treatment thus far and comfortable continuing as planned  Assessment/Plan: 1. 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 Continue weekly chemo scheduled thru 07-05-14, with cbc cmet Mg day of each treatment, RT scheduled thru 07-08-14. I will see her on 12-30, coordinating with RT schedule. 2.long and ongoing tobacco: she has already significantly decreased cigarettes and wants  to DC entirely. Nicotine gum recommended. 3.unintentional weight loss of ~ 20 lbs prior to cancer diagnosis: not clear that this is related to the cancer, TSH normal 03-2014. May need [place] dietician to see. 4. HTN and elevated cholesterol recently diagnosed, being treated by PCP 5.flu vaccine done 6.overdue mammograms, which we will address as soon as possible 7.post BTL remotely   Chemo orders confirmed. Patient knows to call if needed prior to next scheduled visit.    Sheilah Rayos P, MD   06/21/2014, 11:00 AM

## 2014-06-22 ENCOUNTER — Encounter: Payer: Self-pay | Admitting: Radiation Oncology

## 2014-06-22 ENCOUNTER — Ambulatory Visit
Admission: RE | Admit: 2014-06-22 | Discharge: 2014-06-22 | Disposition: A | Discharge status: Home / Self Care | Payer: Self-pay | Source: Ambulatory Visit | Attending: Radiation Oncology | Admitting: Radiation Oncology

## 2014-06-22 ENCOUNTER — Telehealth: Payer: Self-pay | Admitting: Oncology

## 2014-06-22 VITALS — BP 124/77 | HR 73 | Temp 97.8°F | Resp 16 | Ht 67.0 in | Wt 128.5 lb

## 2014-06-22 DIAGNOSIS — C539 Malignant neoplasm of cervix uteri, unspecified: Secondary | ICD-10-CM

## 2014-06-22 NOTE — Progress Notes (Signed)
  Radiation Oncology         [phone] ________________________________  Name: Martha Koch MRN: [id number]  Date: 06/22/2014  DOB: 1956-03-14  Weekly Radiation Therapy Management  Diagnosis: Cervical cancer  Current Dose: 27 Gy     Planned Dose:  50.4 Gy  Narrative . . . . . . . . The patient presents for routine under treatment assessment.                                   The patient is without complaint except for some rectal irritation. Patient will be given a sitz bath for this issue. She has minimal urinary symptoms. She denies any nausea or diarrhea.                                 Set-up films were reviewed.                                 The chart was checked. Physical Findings. . .  height is 5\' 7"  (1.702 m) and weight is 128 lb 8 oz (58.287 kg). Her oral temperature is 97.8 F (36.6 C). Her blood pressure is 124/77 and her pulse is 73. Her respiration is 16. . The lungs are clear. The heart has a regular rhythm and rate. The abdomen is soft and nontender with normal bowel sounds. Impression . . . . . . . The patient is tolerating radiation. Plan . . . . . . . . . . . . Continue treatment as planned.  ________________________________   Blair Promise, PhD, MD

## 2014-06-22 NOTE — Progress Notes (Signed)
Martha Koch has completed 15 fractions to her pelvis.  She denies pain, nausea and vaginal/rectal bleeding.  She reports feeling pressure in her rectal area that his relieved with bowel movements.  She reports having a tingling feeling at the end of urination.  She reports having 2 loose bowel movements per day.  She reports her appetite is not as good.  Her weight is stable from last week.  She had chemotherapy yesterday.  She reports having vaginal dryness.  She denies skin irritation.  She was given a sitz baht and was instructed how to use it.  She reports having a small amount of fatigue.

## 2014-06-22 NOTE — Telephone Encounter (Signed)
Called Martha Koch to let her know that her letter for disability is ready for pick up in the nursing area.

## 2014-06-23 ENCOUNTER — Ambulatory Visit
Admission: RE | Admit: 2014-06-23 | Discharge: 2014-06-23 | Disposition: A | Discharge status: Home / Self Care | Payer: Self-pay | Source: Ambulatory Visit | Attending: Radiation Oncology | Admitting: Radiation Oncology

## 2014-06-24 ENCOUNTER — Ambulatory Visit
Admission: RE | Admit: 2014-06-24 | Discharge: 2014-06-24 | Disposition: A | Discharge status: Home / Self Care | Payer: Self-pay | Source: Ambulatory Visit | Attending: Radiation Oncology | Admitting: Radiation Oncology

## 2014-06-24 ENCOUNTER — Encounter: Payer: Self-pay | Admitting: Oncology

## 2014-06-27 ENCOUNTER — Other Ambulatory Visit: Payer: Self-pay | Admitting: Oncology

## 2014-06-28 ENCOUNTER — Other Ambulatory Visit: Payer: Self-pay | Admitting: Oncology

## 2014-06-28 ENCOUNTER — Ambulatory Visit
Admission: RE | Admit: 2014-06-28 | Discharge: 2014-06-28 | Disposition: A | Discharge status: Home / Self Care | Payer: Self-pay | Source: Ambulatory Visit | Attending: Radiation Oncology | Admitting: Radiation Oncology

## 2014-06-28 ENCOUNTER — Ambulatory Visit (HOSPITAL_BASED_OUTPATIENT_CLINIC_OR_DEPARTMENT_OTHER): Payer: Self-pay

## 2014-06-28 ENCOUNTER — Other Ambulatory Visit (HOSPITAL_BASED_OUTPATIENT_CLINIC_OR_DEPARTMENT_OTHER): Payer: Self-pay

## 2014-06-28 DIAGNOSIS — C539 Malignant neoplasm of cervix uteri, unspecified: Secondary | ICD-10-CM

## 2014-06-28 DIAGNOSIS — Z5111 Encounter for antineoplastic chemotherapy: Secondary | ICD-10-CM

## 2014-06-28 LAB — COMPREHENSIVE METABOLIC PANEL (CC13)
ALK PHOS: 57 U/L (ref 40–150)
ALT: 17 U/L (ref 0–55)
AST: 15 U/L (ref 5–34)
Albumin: 3.6 g/dL (ref 3.5–5.0)
Anion Gap: 9 mEq/L (ref 3–11)
BUN: 8 mg/dL (ref 7.0–26.0)
CO2: 28 mEq/L (ref 22–29)
Calcium: 9.1 mg/dL (ref 8.4–10.4)
Chloride: 106 mEq/L (ref 98–109)
Creatinine: 0.7 mg/dL (ref 0.6–1.1)
EGFR: >90 mL/min/{1.73_m2} (ref >90)
Glucose: 69 mg/dl — ABNORMAL LOW (ref 70–140)
Potassium: 4 mEq/L (ref 3.5–5.1)
SODIUM: 143 meq/L (ref 136–145)
TOTAL PROTEIN: 6.6 g/dL (ref 6.4–8.3)

## 2014-06-28 LAB — CBC WITH DIFFERENTIAL/PLATELET
BASO%: 0.4 % (ref 0.0–2.0)
BASOS ABS: 0 10*3/uL (ref 0.0–0.1)
EOS%: 4.4 % (ref 0.0–7.0)
Eosinophils Absolute: 0.1 10*3/uL (ref 0.0–0.5)
HEMATOCRIT: 36.8 % (ref 34.8–46.6)
HEMOGLOBIN: 11.8 g/dL (ref 11.6–15.9)
LYMPH#: 0.5 10*3/uL — AB (ref 0.9–3.3)
LYMPH%: 23.6 % (ref 14.0–49.7)
MCH: 26.2 pg (ref 25.1–34.0)
MCHC: 32.1 g/dL (ref 31.5–36.0)
MCV: 81.6 fL (ref 79.5–101.0)
MONO#: 0.2 10*3/uL (ref 0.1–0.9)
MONO%: 9.2 % (ref 0.0–14.0)
NEUT%: 62.4 % (ref 38.4–76.8)
NEUTROS ABS: 1.4 10*3/uL — AB (ref 1.5–6.5)
Platelets: 175 10*3/uL (ref 145–400)
RBC: 4.51 10*6/uL (ref 3.70–5.45)
RDW: 15.6 % — AB (ref 11.2–14.5)
WBC: 2.3 10*3/uL — AB (ref 3.9–10.3)

## 2014-06-28 LAB — MAGNESIUM (CC13): Magnesium: 2.4 mg/dl (ref 1.5–2.5)

## 2014-06-28 MED ORDER — SODIUM CHLORIDE 0.9 % IV SOLN
150.0000 mg | Freq: Once | INTRAVENOUS | Status: AC
  Administered 2014-06-28: 150 mg via INTRAVENOUS
  Filled 2014-06-28: qty 5

## 2014-06-28 MED ORDER — PALONOSETRON HCL INJECTION 0.25 MG/5ML
INTRAVENOUS | Status: AC
  Filled 2014-06-28: qty 5

## 2014-06-28 MED ORDER — DEXTROSE-NACL 5-0.45 % IV SOLN
Freq: Once | INTRAVENOUS | Status: AC
  Administered 2014-06-28: 09:00:00 via INTRAVENOUS
  Filled 2014-06-28: qty 10

## 2014-06-28 MED ORDER — DEXAMETHASONE SODIUM PHOSPHATE 20 MG/5ML IJ SOLN
12.0000 mg | Freq: Once | INTRAMUSCULAR | Status: AC
  Administered 2014-06-28: 12 mg via INTRAVENOUS

## 2014-06-28 MED ORDER — PALONOSETRON HCL INJECTION 0.25 MG/5ML
0.2500 mg | Freq: Once | INTRAVENOUS | Status: AC
  Administered 2014-06-28: 0.25 mg via INTRAVENOUS

## 2014-06-28 MED ORDER — SODIUM CHLORIDE 0.9 % IV SOLN
Freq: Once | INTRAVENOUS | Status: AC
  Administered 2014-06-28: 09:00:00 via INTRAVENOUS

## 2014-06-28 MED ORDER — CISPLATIN CHEMO INJECTION 100MG/100ML
40.0000 mg/m2 | Freq: Once | INTRAVENOUS | Status: AC
  Administered 2014-06-28: 66 mg via INTRAVENOUS
  Filled 2014-06-28: qty 66

## 2014-06-28 MED ORDER — DEXAMETHASONE SODIUM PHOSPHATE 20 MG/5ML IJ SOLN
INTRAMUSCULAR | Status: AC
  Filled 2014-06-28: qty 5

## 2014-06-28 NOTE — Patient Instructions (Signed)
[facility] Discharge Instructions for Patients Receiving Chemotherapy  Today you received the following chemotherapy agents Cisplatin.  To help prevent nausea and vomiting after your treatment, we encourage you to take your nausea medication as prescribed.   If you develop nausea and vomiting that is not controlled by your nausea medication, call the clinic.   BELOW ARE SYMPTOMS THAT SHOULD BE REPORTED IMMEDIATELY:  *FEVER GREATER THAN 100.5 F  *CHILLS WITH OR WITHOUT FEVER  NAUSEA AND VOMITING THAT IS NOT CONTROLLED WITH YOUR NAUSEA MEDICATION  *UNUSUAL SHORTNESS OF BREATH  *UNUSUAL BRUISING OR BLEEDING  TENDERNESS IN MOUTH AND THROAT WITH OR WITHOUT PRESENCE OF ULCERS  *URINARY PROBLEMS  *BOWEL PROBLEMS  UNUSUAL RASH Items with * indicate a potential emergency and should be followed up as soon as possible.  Feel free to call the clinic you have any questions or concerns. The clinic phone number is [phone].    

## 2014-06-28 NOTE — Progress Notes (Signed)
Spoke to Dr Marko Plume regarding pt labs.  Ok to proceed with treatment today, pt to get neulasta tomorrow.

## 2014-06-28 NOTE — Progress Notes (Signed)
Medical Oncology  ANC 1.4 today. Will treat as planned if otherwise stable and add granix 300 on 06-29-14, and recheck CBC with LL visit 12-30.  L.Bryson Palen

## 2014-06-29 ENCOUNTER — Ambulatory Visit
Admission: RE | Admit: 2014-06-29 | Discharge: 2014-06-29 | Disposition: A | Discharge status: Home / Self Care | Payer: Self-pay | Source: Ambulatory Visit | Attending: Radiation Oncology | Admitting: Radiation Oncology

## 2014-06-29 ENCOUNTER — Ambulatory Visit (HOSPITAL_BASED_OUTPATIENT_CLINIC_OR_DEPARTMENT_OTHER): Payer: Self-pay

## 2014-06-29 ENCOUNTER — Encounter: Payer: Self-pay | Admitting: Radiation Oncology

## 2014-06-29 VITALS — BP 144/78 | HR 81 | Resp 16 | Wt 129.7 lb

## 2014-06-29 DIAGNOSIS — Z5189 Encounter for other specified aftercare: Secondary | ICD-10-CM

## 2014-06-29 DIAGNOSIS — C539 Malignant neoplasm of cervix uteri, unspecified: Secondary | ICD-10-CM

## 2014-06-29 MED ORDER — TBO-FILGRASTIM 300 MCG/0.5ML ~~LOC~~ SOSY
300.0000 ug | PREFILLED_SYRINGE | Freq: Once | SUBCUTANEOUS | Status: AC
  Administered 2014-06-29: 300 ug via SUBCUTANEOUS
  Filled 2014-06-29: qty 0.5

## 2014-06-29 NOTE — Progress Notes (Signed)
Weekly Management Note Current Dose: 34.2  Gy  Projected Dose: 45 Gy   Narrative:  The patient presents for routine under treatment assessment.  CBCT/MVCT images/Port film x-rays were reviewed.  The chart was checked. Doing well. No complaints. Diarrhea x 6 on Friday and responded to immodium.   Physical Findings: Weight: 129 lb 11.2 oz (58.832 kg). Unchanged  Impression:  The patient is tolerating radiation.  Plan:  Continue treatment as planned. Continue immodium prn.

## 2014-06-29 NOTE — Patient Instructions (Signed)

## 2014-06-29 NOTE — Progress Notes (Addendum)
Reports the twinge at the start and end of urination has resolved. Reports diarrhea she experienced on Friday resolved with Imodium. Denies diarrhea or constipation since. Denies vaginal odor, itching or discharge. Denies hematuria or blood in stool. Denies pain. Weight and vitals stable. Reports drinking at least one BOOST supplement per day.

## 2014-06-30 ENCOUNTER — Ambulatory Visit (HOSPITAL_BASED_OUTPATIENT_CLINIC_OR_DEPARTMENT_OTHER): Payer: Self-pay | Admitting: Oncology

## 2014-06-30 ENCOUNTER — Ambulatory Visit
Admission: RE | Admit: 2014-06-30 | Discharge: 2014-06-30 | Disposition: A | Discharge status: Home / Self Care | Payer: Self-pay | Source: Ambulatory Visit | Attending: Radiation Oncology | Admitting: Radiation Oncology

## 2014-06-30 ENCOUNTER — Other Ambulatory Visit (HOSPITAL_BASED_OUTPATIENT_CLINIC_OR_DEPARTMENT_OTHER): Payer: Self-pay

## 2014-06-30 ENCOUNTER — Encounter: Payer: Self-pay | Admitting: Oncology

## 2014-06-30 VITALS — BP 118/79 | HR 74 | Temp 97.8°F | Resp 18 | Ht 67.0 in | Wt 130.4 lb

## 2014-06-30 DIAGNOSIS — Z720 Tobacco use: Secondary | ICD-10-CM

## 2014-06-30 DIAGNOSIS — C539 Malignant neoplasm of cervix uteri, unspecified: Secondary | ICD-10-CM

## 2014-06-30 DIAGNOSIS — R634 Abnormal weight loss: Secondary | ICD-10-CM

## 2014-06-30 LAB — CBC WITH DIFFERENTIAL/PLATELET
BASO%: 0.5 % (ref 0.0–2.0)
BASOS ABS: 0.1 10*3/uL (ref 0.0–0.1)
EOS%: 0.5 % (ref 0.0–7.0)
Eosinophils Absolute: 0.1 10*3/uL (ref 0.0–0.5)
HCT: 38.5 % (ref 34.8–46.6)
HEMOGLOBIN: 11.9 g/dL (ref 11.6–15.9)
LYMPH%: 4.1 % — AB (ref 14.0–49.7)
MCH: 25.5 pg (ref 25.1–34.0)
MCHC: 30.8 g/dL — AB (ref 31.5–36.0)
MCV: 82.7 fL (ref 79.5–101.0)
MONO#: 0.6 10*3/uL (ref 0.1–0.9)
MONO%: 5.7 % (ref 0.0–14.0)
NEUT#: 9.9 10*3/uL — ABNORMAL HIGH (ref 1.5–6.5)
NEUT%: 89.2 % — AB (ref 38.4–76.8)
Platelets: 185 10*3/uL (ref 145–400)
RBC: 4.65 10*6/uL (ref 3.70–5.45)
RDW: 15.5 % — ABNORMAL HIGH (ref 11.2–14.5)
WBC: 11.1 10*3/uL — ABNORMAL HIGH (ref 3.9–10.3)
lymph#: 0.5 10*3/uL — ABNORMAL LOW (ref 0.9–3.3)

## 2014-06-30 NOTE — Progress Notes (Signed)
OFFICE PROGRESS NOTE   06/30/2014   Physicians:Emma Rossi/ [name], [name], [name] (PCP, [facility])  INTERVAL HISTORY:  Patient is seen, alone for visit, in continuing attention to sensitizing CDDP chemotherapy being used with radiation for IB1 squamous cell carcinoma of cervix. She will have cycle 5 CDDP on 07-05-13, which is last scheduled.    She is tolerating treatment well, with single episode of mild nausea requiring antiemetic yesterday, this the first time that she has needed the prn antiemetic. Bowel movements are regular with soft formed stools, no diarrhea. It is less difficult for her to have BMs and to urinate. She is eating and drinking fluids adequately. She is using sitz baths for skin irritation.   She has cut down cigarettes to < 3 cigarettes in 24 hours, this helped with taste alterations from chemo. She wants to stop entirely, which we have discussed again now.  No PAC Flu vaccine done  ONCOLOGIC HISTORY Patient presented to ED with 1 year of vaginal discharge/ dark spotting 02-22-14, Trichomonas +. US showed multiple uterine fibroids and 6.5 cm cyst in right ovary with thickened septation and peripheral nodularity. 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 Exam 04-01-14 was remarkable for cervix hard, friable, irregular and PAP ([id number]) had CIN-3/CIS with suspicion for invasion. CT AP 04-06-14: 3.9 cm mass in the cervix, suspicious for cervical carcinoma; other uterine myometrial masses measuring up to 4 cm, most likely representing fibroids,7.6 cm complex cystic lesion in right adnexa and cul-de-sac. She saw Dr Skeet Latch 04-12-14, with cervical biopsy ([id number] ) documenting invasive squamous cell carcinoma. She went to surgery at [facility] 04-21-14 by Dr Skeet Latch, this robotic radical hysterectomy with upper  vaginectomy, BSO and bilateral pelvic lymphadenectomy, with barrel shaped cervix, 2 cm lesion on cervix, uterus slightly enlarged and large right ovarian cyst. .Pathology from [facility] 04-21-14 [phone]) cervix with moderately to poorly differentiated squamous cell carcinoma, circumferential within cervix and involving anterior and posterior lower uterine segments and endometrium, invading into myometrium and extensively into bilateral parametrial soft tissue. Perineural and LVSI present as well as large vessel involvement. Surgical margins 3 mm from anterior deep cervical stromal margin and vaginal cuff widely negative. * left pelvic nodes, 3 right pelvic nodes, 2 left obturator nodes and 5 parametrial nodes all negative. Right ovary and tube with serous cystadenoma. Adenomatous endometrial polyp with ulceration, also involved by squamous cell carcinoma. Pelvic washings ([id number]) no malignant cells identified. Staging was IB1.  [facility] multidisciplinary gyn oncology conference 04-28-14 recommended whole pelvic radiation with sensitizing CDDP and suggested consideration of PET CT to evaluate peri-aortic involvement given small number of nodes sampled on right. Patient had post op follow up with Dr Denman George in [place] on 04-29-14, foley removed then. She had ER visit on 10-30 due to constipation from pain medication. PET 05-04-14 showed no abdominal or pelvic hypermetabolic nodes or other evidence of metastatic disease, tho some low level hypermetabolism around pelvic bowel loops and small volume ascites thought post op or inflammation, and borderline dilated small bowel loops thought post op adynamic ileus.  She began IMRT by Dr Sondra Come on 06-03-14 (?), this scheduled thru 07-12-13. First weekly CDDP was given 06-07-14.  Review of systems as above, also: No bleeding. No fever. No pain now. Fatigue is not severe. Remainder of 10 point Review of Systems negative.  Objective:  Vital signs in last  24 hours:  BP  118/79 mmHg  Pulse 74  Temp(Src) 97.8 F (36.6 C) (Oral)  Resp 18  Ht 5\' 7"  (1.702 m)  Wt 130 lb 6.4 oz (59.149 kg)  BMI 20.42 kg/m2  SpO2 100% Weight is up 1 lb. Alert, oriented and appropriate. Ambulatory without difficulty.  No alopecia  HEENT:PERRL, sclerae not icteric. Oral mucosa moist without lesions, posterior pharynx clear.  Neck supple. No JVD.  Lymphatics:no cervical,supraclavicular or inguinal adenopathy Resp: clear to auscultation bilaterally and normal percussion bilaterally Cardio: regular rate and rhythm. No gallop. GI: soft, nontender, not distended, no mass or organomegaly. Normal bowel sounds.  Musculoskeletal/ Extremities: without pitting edema, cords, tenderness Neuro:  nonfocal  PSYCH appropriate mood and affect Skin without rash, ecchymosis, petechiae   Lab Results:  Results for orders placed or performed in visit on 06/30/14  CBC with Differential  Result Value Ref Range   WBC 11.1 (H) 3.9 - 10.3 10e3/uL   NEUT# 9.9 (H) 1.5 - 6.5 10e3/uL   HGB 11.9 11.6 - 15.9 g/dL   HCT 38.5 34.8 - 46.6 %   Platelets 185 145 - 400 10e3/uL   MCV 82.7 79.5 - 101.0 fL   MCH 25.5 25.1 - 34.0 pg   MCHC 30.8 (L) 31.5 - 36.0 g/dL   RBC 4.65 3.70 - 5.45 10e6/uL   RDW 15.5 (H) 11.2 - 14.5 %   lymph# 0.5 (L) 0.9 - 3.3 10e3/uL   MONO# 0.6 0.1 - 0.9 10e3/uL   Eosinophils Absolute 0.1 0.0 - 0.5 10e3/uL   Basophils Absolute 0.1 0.0 - 0.1 10e3/uL   NEUT% 89.2 (H) 38.4 - 76.8 %   LYMPH% 4.1 (L) 14.0 - 49.7 %   MONO% 5.7 0.0 - 14.0 %   EOS% 0.5 0.0 - 7.0 %   BASO% 0.5 0.0 - 2.0 %    CMET Mg from 12-28  Normal with exception of glucose of 69, including creatinine 0.7 and magnesium 2.4  Studies/Results:  No results found.  Medications: I have reviewed the patient's current medications. She has nicotine gum.  DISCUSSION  Assessment/Plan: 1. 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 Continue weekly chemo scheduled thru 07-05-14, with cbc cmet Mg day of each treatment, RT scheduled thru 07-12-14. I will see her and recheck labs ~ 07-19-14. 2.long and ongoing tobacco: she has already significantly decreased cigarettes and wants to DC entirely. 3.unintentional weight loss of ~ 20 lbs prior to cancer diagnosis: not clear that this is related to the cancer, TSH normal 03-2014. May need [place] dietician to see. 4. HTN and elevated cholesterol recently diagnosed, being treated by PCP 5.flu vaccine done 6.overdue mammograms. Outreach coordinator looking into mammogram scholarship for her 7.post BTL remotely   All questions answered. Patient is pleased that she continues to tolerate treatment well and knows that she can call if needed prior to next scheduled appointment. Chemo orders confirmed.    Yunis Voorheis P, MD   06/30/2014, 10:48 AM

## 2014-07-01 ENCOUNTER — Ambulatory Visit
Admission: RE | Admit: 2014-07-01 | Discharge: 2014-07-01 | Disposition: A | Discharge status: Home / Self Care | Payer: Self-pay | Source: Ambulatory Visit | Attending: Radiation Oncology | Admitting: Radiation Oncology

## 2014-07-01 ENCOUNTER — Other Ambulatory Visit: Payer: Self-pay | Admitting: Oncology

## 2014-07-05 ENCOUNTER — Ambulatory Visit (HOSPITAL_BASED_OUTPATIENT_CLINIC_OR_DEPARTMENT_OTHER): Payer: Self-pay

## 2014-07-05 ENCOUNTER — Telehealth: Payer: Self-pay | Admitting: Oncology

## 2014-07-05 ENCOUNTER — Ambulatory Visit
Admission: RE | Admit: 2014-07-05 | Discharge: 2014-07-05 | Disposition: A | Discharge status: Home / Self Care | Payer: Self-pay | Source: Ambulatory Visit | Attending: Radiation Oncology | Admitting: Radiation Oncology

## 2014-07-05 ENCOUNTER — Other Ambulatory Visit: Payer: Self-pay | Admitting: Obstetrics and Gynecology

## 2014-07-05 ENCOUNTER — Other Ambulatory Visit (HOSPITAL_BASED_OUTPATIENT_CLINIC_OR_DEPARTMENT_OTHER): Payer: Self-pay

## 2014-07-05 ENCOUNTER — Other Ambulatory Visit: Payer: Self-pay | Admitting: Oncology

## 2014-07-05 DIAGNOSIS — C539 Malignant neoplasm of cervix uteri, unspecified: Secondary | ICD-10-CM

## 2014-07-05 DIAGNOSIS — Z5111 Encounter for antineoplastic chemotherapy: Secondary | ICD-10-CM

## 2014-07-05 DIAGNOSIS — Z1231 Encounter for screening mammogram for malignant neoplasm of breast: Secondary | ICD-10-CM

## 2014-07-05 LAB — CBC WITH DIFFERENTIAL/PLATELET
BASO%: 0.5 % (ref 0.0–2.0)
Basophils Absolute: 0 10*3/uL (ref 0.0–0.1)
EOS%: 3.6 % (ref 0.0–7.0)
Eosinophils Absolute: 0.1 10*3/uL (ref 0.0–0.5)
HEMATOCRIT: 36.8 % (ref 34.8–46.6)
HGB: 11.8 g/dL (ref 11.6–15.9)
LYMPH%: 19.8 % (ref 14.0–49.7)
MCH: 26 pg (ref 25.1–34.0)
MCHC: 32.1 g/dL (ref 31.5–36.0)
MCV: 81.2 fL (ref 79.5–101.0)
MONO#: 0.2 10*3/uL (ref 0.1–0.9)
MONO%: 12.2 % (ref 0.0–14.0)
NEUT#: 1.3 10*3/uL — ABNORMAL LOW (ref 1.5–6.5)
NEUT%: 63.9 % (ref 38.4–76.8)
PLATELETS: 197 10*3/uL (ref 145–400)
RBC: 4.53 10*6/uL (ref 3.70–5.45)
RDW: 16.1 % — ABNORMAL HIGH (ref 11.2–14.5)
WBC: 2 10*3/uL — ABNORMAL LOW (ref 3.9–10.3)
lymph#: 0.4 10*3/uL — ABNORMAL LOW (ref 0.9–3.3)

## 2014-07-05 LAB — COMPREHENSIVE METABOLIC PANEL (CC13)
ALT: 15 U/L (ref 0–55)
ANION GAP: 8 meq/L (ref 3–11)
AST: 15 U/L (ref 5–34)
Albumin: 3.9 g/dL (ref 3.5–5.0)
Alkaline Phosphatase: 63 U/L (ref 40–150)
BUN: 11 mg/dL (ref 7.0–26.0)
CALCIUM: 9.4 mg/dL (ref 8.4–10.4)
CHLORIDE: 105 meq/L (ref 98–109)
CO2: 28 meq/L (ref 22–29)
CREATININE: 0.8 mg/dL (ref 0.6–1.1)
GLUCOSE: 79 mg/dL (ref 70–140)
Potassium: 4.1 mEq/L (ref 3.5–5.1)
Sodium: 141 mEq/L (ref 136–145)
Total Bilirubin: 0.22 mg/dL (ref 0.20–1.20)
Total Protein: 7 g/dL (ref 6.4–8.3)

## 2014-07-05 LAB — MAGNESIUM (CC13): Magnesium: 2.5 mg/dl (ref 1.5–2.5)

## 2014-07-05 MED ORDER — PALONOSETRON HCL INJECTION 0.25 MG/5ML
0.2500 mg | Freq: Once | INTRAVENOUS | Status: AC
  Administered 2014-07-05: 0.25 mg via INTRAVENOUS

## 2014-07-05 MED ORDER — POTASSIUM CHLORIDE 2 MEQ/ML IV SOLN
Freq: Once | INTRAVENOUS | Status: AC
  Administered 2014-07-05: 09:00:00 via INTRAVENOUS
  Filled 2014-07-05: qty 10

## 2014-07-05 MED ORDER — DIPHENHYDRAMINE HCL 50 MG/ML IJ SOLN
INTRAMUSCULAR | Status: AC
  Filled 2014-07-05: qty 1

## 2014-07-05 MED ORDER — FAMOTIDINE IN NACL 20-0.9 MG/50ML-% IV SOLN
INTRAVENOUS | Status: AC
  Filled 2014-07-05: qty 50

## 2014-07-05 MED ORDER — ONDANSETRON 16 MG/50ML IVPB (CHCC)
INTRAVENOUS | Status: AC
  Filled 2014-07-05: qty 16

## 2014-07-05 MED ORDER — PALONOSETRON HCL INJECTION 0.25 MG/5ML
INTRAVENOUS | Status: AC
  Filled 2014-07-05: qty 5

## 2014-07-05 MED ORDER — DEXAMETHASONE SODIUM PHOSPHATE 20 MG/5ML IJ SOLN
INTRAMUSCULAR | Status: AC
  Filled 2014-07-05: qty 5

## 2014-07-05 MED ORDER — SODIUM CHLORIDE 0.9 % IV SOLN
40.0000 mg/m2 | Freq: Once | INTRAVENOUS | Status: AC
  Administered 2014-07-05: 66 mg via INTRAVENOUS
  Filled 2014-07-05: qty 66

## 2014-07-05 MED ORDER — SODIUM CHLORIDE 0.9 % IV SOLN
Freq: Once | INTRAVENOUS | Status: AC
  Administered 2014-07-05: 13:00:00 via INTRAVENOUS

## 2014-07-05 MED ORDER — SODIUM CHLORIDE 0.9 % IV SOLN
Freq: Once | INTRAVENOUS | Status: AC
  Administered 2014-07-05: 500 mL via INTRAVENOUS

## 2014-07-05 MED ORDER — DEXAMETHASONE SODIUM PHOSPHATE 20 MG/5ML IJ SOLN
12.0000 mg | Freq: Once | INTRAMUSCULAR | Status: AC
Start: 2014-07-05 — End: 2014-07-05
  Administered 2014-07-05: 12 mg via INTRAVENOUS

## 2014-07-05 MED ORDER — SODIUM CHLORIDE 0.9 % IV SOLN
150.0000 mg | Freq: Once | INTRAVENOUS | Status: AC
  Administered 2014-07-05: 150 mg via INTRAVENOUS
  Filled 2014-07-05: qty 5

## 2014-07-05 NOTE — Progress Notes (Signed)
Pt's counts low.  Per Dr. Marko Plume it is ok to treat.  Pt will need an appointment for granix tomorrow.

## 2014-07-05 NOTE — Patient Instructions (Signed)
[facility] Discharge Instructions for Patients Receiving Chemotherapy  Today you received the following chemotherapy agents Cisplatin To help prevent nausea and vomiting after your treatment, we encourage you to take your nausea medication as directed/prescribed    If you develop nausea and vomiting that is not controlled by your nausea medication, call the clinic.   BELOW ARE SYMPTOMS THAT SHOULD BE REPORTED IMMEDIATELY:  *FEVER GREATER THAN 100.5 F  *CHILLS WITH OR WITHOUT FEVER  NAUSEA AND VOMITING THAT IS NOT CONTROLLED WITH YOUR NAUSEA MEDICATION  *UNUSUAL SHORTNESS OF BREATH  *UNUSUAL BRUISING OR BLEEDING  TENDERNESS IN MOUTH AND THROAT WITH OR WITHOUT PRESENCE OF ULCERS  *URINARY PROBLEMS  *BOWEL PROBLEMS  UNUSUAL RASH Items with * indicate a potential emergency and should be followed up as soon as possible.  Feel free to call the clinic you have any questions or concerns. The clinic phone number is [phone].    

## 2014-07-05 NOTE — Telephone Encounter (Signed)
s.w. pt and advised on Jan appt.....pt ok and aware °

## 2014-07-06 ENCOUNTER — Ambulatory Visit (HOSPITAL_BASED_OUTPATIENT_CLINIC_OR_DEPARTMENT_OTHER): Payer: Self-pay

## 2014-07-06 ENCOUNTER — Ambulatory Visit
Admission: RE | Admit: 2014-07-06 | Discharge: 2014-07-06 | Disposition: A | Discharge status: Home / Self Care | Payer: Self-pay | Source: Ambulatory Visit | Attending: Radiation Oncology | Admitting: Radiation Oncology

## 2014-07-06 ENCOUNTER — Encounter: Payer: Self-pay | Admitting: Radiation Oncology

## 2014-07-06 VITALS — BP 146/89 | HR 92 | Temp 97.7°F | Resp 16 | Ht 67.0 in | Wt 129.2 lb

## 2014-07-06 DIAGNOSIS — C539 Malignant neoplasm of cervix uteri, unspecified: Secondary | ICD-10-CM

## 2014-07-06 DIAGNOSIS — Z5189 Encounter for other specified aftercare: Secondary | ICD-10-CM

## 2014-07-06 MED ORDER — TBO-FILGRASTIM 300 MCG/0.5ML ~~LOC~~ SOSY
300.0000 ug | PREFILLED_SYRINGE | Freq: Once | SUBCUTANEOUS | Status: AC
  Administered 2014-07-06: 300 ug via SUBCUTANEOUS
  Filled 2014-07-06: qty 0.5

## 2014-07-06 NOTE — Patient Instructions (Signed)

## 2014-07-06 NOTE — Progress Notes (Signed)
  Radiation Oncology         [phone] ________________________________  Name: Martha Koch MRN: [id number]  Date: 07/06/2014  DOB: Jul 27, 1955  Weekly Radiation Therapy Management  DIAGNOSIS: Stage IB1 invasive squamous carcinoma of the cervix, high risk features  Current Dose: 41.4 Gy     Planned Dose:  50.4 Gy  Narrative . . . . . . . . The patient presents for routine under treatment assessment.                                   The patient is without complaint. Tolerating radiation and chemotherapy quite well                                 Set-up films were reviewed.                                 The chart was checked. Physical Findings. . .  height is 5\' 7"  (1.702 m) and weight is 129 lb 3.2 oz (58.605 kg). Her oral temperature is 97.7 F (36.5 C). Her blood pressure is 146/89 and her pulse is 92. Her respiration is 16. . The lungs are clear. The heart has a regular rhythm and rate. The abdomen is soft and nontender with normal bowel sounds. Impression . . . . . . . The patient is tolerating radiation. Plan . . . . . . . . . . . . Continue treatment as planned.  ________________________________   Blair Promise, PhD, MD

## 2014-07-06 NOTE — Progress Notes (Signed)
Martha Koch has completed 23 fractions to her pelvis.  She denies pain and nausea.  She completed her last chemotherapy yesterday.  She reports that she has a good appetite.  She denies any bladder issues, skin irritation and vaginal/rectal bleeding.  She denies having any diarrhea.  She reports her stools are loose and normal for her.  She denies fatigue.

## 2014-07-07 ENCOUNTER — Ambulatory Visit
Admission: RE | Admit: 2014-07-07 | Discharge: 2014-07-07 | Disposition: A | Discharge status: Home / Self Care | Payer: Self-pay | Source: Ambulatory Visit | Attending: Radiation Oncology | Admitting: Radiation Oncology

## 2014-07-08 ENCOUNTER — Ambulatory Visit: Payer: Self-pay

## 2014-07-08 ENCOUNTER — Ambulatory Visit
Admission: RE | Admit: 2014-07-08 | Discharge: 2014-07-08 | Disposition: A | Discharge status: Home / Self Care | Payer: Self-pay | Source: Ambulatory Visit | Attending: Radiation Oncology | Admitting: Radiation Oncology

## 2014-07-09 ENCOUNTER — Ambulatory Visit
Admission: RE | Admit: 2014-07-09 | Discharge: 2014-07-09 | Disposition: A | Discharge status: Home / Self Care | Payer: Self-pay | Source: Ambulatory Visit | Attending: Radiation Oncology | Admitting: Radiation Oncology

## 2014-07-12 ENCOUNTER — Ambulatory Visit
Admission: RE | Admit: 2014-07-12 | Discharge: 2014-07-12 | Disposition: A | Discharge status: Home / Self Care | Payer: Self-pay | Source: Ambulatory Visit | Attending: Radiation Oncology | Admitting: Radiation Oncology

## 2014-07-12 ENCOUNTER — Ambulatory Visit: Payer: Self-pay

## 2014-07-13 ENCOUNTER — Ambulatory Visit
Admission: RE | Admit: 2014-07-13 | Discharge: 2014-07-13 | Disposition: A | Discharge status: Home / Self Care | Payer: Self-pay | Source: Ambulatory Visit | Attending: Radiation Oncology | Admitting: Radiation Oncology

## 2014-07-13 ENCOUNTER — Encounter: Payer: Self-pay | Admitting: Radiation Oncology

## 2014-07-13 VITALS — BP 124/91 | HR 94 | Temp 98.0°F | Resp 16 | Ht 67.0 in | Wt 127.0 lb

## 2014-07-13 DIAGNOSIS — C539 Malignant neoplasm of cervix uteri, unspecified: Secondary | ICD-10-CM

## 2014-07-13 NOTE — Progress Notes (Signed)
  Radiation Oncology         ([phone] ________________________________  Name: Martha Koch MRN: [id number]  Date: 07/13/2014  DOB: 1955-12-29  Weekly Radiation Therapy Management  DIAGNOSIS: Stage IB1 invasive squamous carcinoma of the cervix, high risk features  Current Dose: 50.4 Gy     Planned Dose:  50.4 Gy  Narrative . . . . . . . . The patient presents for routine under treatment assessment.                                   The patient is without complaint except for some increased urinary frequency. She denies any significant dysuria or strong odor to her urine. She has had some mild diarrhea. Patient's occasional nausea is controlled well with medication. Patient is happy to complete her radiation therapy today.                                 Set-up films were reviewed.                                 The chart was checked. Physical Findings. . .  height is 5\' 7"  (1.702 m) and weight is 127 lb (57.607 kg). Her oral temperature is 98 F (36.7 C). Her blood pressure is 124/91 and her pulse is 94. Her respiration is 16. . The lungs are clear. The heart has a regular rhythm and rate. The abdomen is soft and nontender with normal bowel sounds. Impression . . . . . . . The patient is tolerating radiation. Plan . . . . . . . . . . . . Continue treatment as planned.  ________________________________   Blair Promise, PhD, MD

## 2014-07-13 NOTE — Progress Notes (Signed)
Martha Koch has completed treatment with 28 fractions to her pelvis.  She reports discomfort/tingling when she finishes urinating.  She reports urinary frequency, especially during the night.  She reports getting up at least 5 times per night.  She reports having diarrhea twice on Friday.  She took Imodium and has not had any more.  Her weight is down 2 lbs since last week.  She reports occasional nausea but not enough to take antinausea medication.  She denies hematuria and vaginal/rectal bleeding.  She had her last chemotherapy last Monday.  She reports that the skin on her vaginal area is darker.  She reports fatigue.

## 2014-07-15 ENCOUNTER — Telehealth: Payer: Self-pay

## 2014-07-15 NOTE — Telephone Encounter (Signed)
She purchased OTC dye to color her hair.  She did not loose her hair with the CDDp chemotherapy.  Her last treatment was 07-05-14.  Suggested that she wait a couple of months before she tries to apply the color and use a test spot to see how her hair reacts to the color.  She states that the color does not have any peroxide or harsh chemicals.  She agrees with the above stated recommendations.

## 2014-07-18 ENCOUNTER — Other Ambulatory Visit: Payer: Self-pay | Admitting: Oncology

## 2014-07-18 DIAGNOSIS — C539 Malignant neoplasm of cervix uteri, unspecified: Secondary | ICD-10-CM

## 2014-07-19 ENCOUNTER — Encounter: Payer: Self-pay | Admitting: Oncology

## 2014-07-19 ENCOUNTER — Telehealth: Payer: Self-pay | Admitting: Oncology

## 2014-07-19 ENCOUNTER — Ambulatory Visit (HOSPITAL_BASED_OUTPATIENT_CLINIC_OR_DEPARTMENT_OTHER): Payer: Self-pay | Admitting: Oncology

## 2014-07-19 ENCOUNTER — Other Ambulatory Visit (HOSPITAL_BASED_OUTPATIENT_CLINIC_OR_DEPARTMENT_OTHER): Payer: Self-pay

## 2014-07-19 VITALS — BP 136/81 | HR 88 | Temp 98.4°F | Resp 20 | Ht 67.0 in | Wt 127.4 lb

## 2014-07-19 DIAGNOSIS — R300 Dysuria: Secondary | ICD-10-CM

## 2014-07-19 DIAGNOSIS — R634 Abnormal weight loss: Secondary | ICD-10-CM

## 2014-07-19 DIAGNOSIS — C539 Malignant neoplasm of cervix uteri, unspecified: Secondary | ICD-10-CM

## 2014-07-19 DIAGNOSIS — D701 Agranulocytosis secondary to cancer chemotherapy: Secondary | ICD-10-CM

## 2014-07-19 DIAGNOSIS — Z720 Tobacco use: Secondary | ICD-10-CM

## 2014-07-19 DIAGNOSIS — T451X5A Adverse effect of antineoplastic and immunosuppressive drugs, initial encounter: Secondary | ICD-10-CM

## 2014-07-19 LAB — CBC WITH DIFFERENTIAL/PLATELET
BASO%: 0.8 % (ref 0.0–2.0)
BASOS ABS: 0 10*3/uL (ref 0.0–0.1)
EOS ABS: 0 10*3/uL (ref 0.0–0.5)
EOS%: 0.8 % (ref 0.0–7.0)
HCT: 34.5 % — ABNORMAL LOW (ref 34.8–46.6)
HGB: 11.3 g/dL — ABNORMAL LOW (ref 11.6–15.9)
LYMPH#: 0.3 10*3/uL — AB (ref 0.9–3.3)
LYMPH%: 25.6 % (ref 14.0–49.7)
MCH: 26.8 pg (ref 25.1–34.0)
MCHC: 32.8 g/dL (ref 31.5–36.0)
MCV: 81.8 fL (ref 79.5–101.0)
MONO#: 0.2 10*3/uL (ref 0.1–0.9)
MONO%: 12.8 % (ref 0.0–14.0)
NEUT#: 0.8 10*3/uL — ABNORMAL LOW (ref 1.5–6.5)
NEUT%: 60 % (ref 38.4–76.8)
PLATELETS: 186 10*3/uL (ref 145–400)
RBC: 4.22 10*6/uL (ref 3.70–5.45)
RDW: 17.7 % — AB (ref 11.2–14.5)
WBC: 1.3 10*3/uL — ABNORMAL LOW (ref 3.9–10.3)

## 2014-07-19 LAB — URINALYSIS, MICROSCOPIC - CHCC
BILIRUBIN (URINE): NEGATIVE
Blood: NEGATIVE
GLUCOSE UR CHCC: NEGATIVE mg/dL
Ketones: NEGATIVE mg/dL
LEUKOCYTE ESTERASE: NEGATIVE
NITRITE: NEGATIVE
PROTEIN: 30 mg/dL
SPECIFIC GRAVITY, URINE: 1.02 (ref 1.003–1.035)
Urobilinogen, UR: 0.2 mg/dL (ref 0.2–1)
pH: 6 (ref 4.6–8.0)

## 2014-07-19 LAB — BASIC METABOLIC PANEL (CC13)
Anion Gap: 9 mEq/L (ref 3–11)
BUN: 12.5 mg/dL (ref 7.0–26.0)
CO2: 29 meq/L (ref 22–29)
CREATININE: 0.8 mg/dL (ref 0.6–1.1)
Calcium: 9.2 mg/dL (ref 8.4–10.4)
Chloride: 106 mEq/L (ref 98–109)
EGFR: 89 mL/min/{1.73_m2} — ABNORMAL LOW (ref >90)
GLUCOSE: 89 mg/dL (ref 70–140)
Potassium: 4.1 mEq/L (ref 3.5–5.1)
Sodium: 144 mEq/L (ref 136–145)

## 2014-07-19 MED ORDER — TBO-FILGRASTIM 300 MCG/0.5ML ~~LOC~~ SOSY
300.0000 ug | PREFILLED_SYRINGE | Freq: Once | SUBCUTANEOUS | Status: AC
  Administered 2014-07-19: 300 ug via SUBCUTANEOUS
  Filled 2014-07-19: qty 0.5

## 2014-07-19 MED ORDER — FILGRASTIM 300 MCG/0.5ML IJ SOSY
300.0000 ug | PREFILLED_SYRINGE | Freq: Once | INTRAMUSCULAR | Status: DC
  Filled 2014-07-19: qty 0.5

## 2014-07-19 NOTE — Telephone Encounter (Signed)
, °

## 2014-07-19 NOTE — Progress Notes (Signed)
OFFICE PROGRESS NOTE     Physicians:Emma Rossi/ [name], Gery Pray, [address] Harraway-Smith, [name] (PCP, [employer])  INTERVAL HISTORY:  Patient is seen, together with daughter, having completed 5 cycles of sensitizing CDDP chemotherapy with RT, all completed 07-13-14. She is to see Dr Sondra Come for one month follow up 08-19-14, then will need to see gyn oncology again.  Patient tolerated all of the treatment without any severe side effects. 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 Appetite is not back to baseline tho taste is improving. She has no bleeding and no pelvic pain. Bowels are moving without frank diarrhea. She has slight dysuria just at end of voiding and we will check urine specimen now.   Peripheral IV access was easily accomplished for chemo Flu vaccine done For scholarship mammogram 07-29-14.  Still smoking ~ 5 cigarettes daily, discussed and encouraged her to set stop date.  ONCOLOGIC HISTORY Patient presented to ED with 1 year of vaginal discharge/ dark spotting 02-22-14, Trichomonas +. US showed multiple uterine fibroids and 6.5 cm cyst in right ovary with thickened septation and peripheral nodularity. 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 Exam 04-01-14 was remarkable for cervix hard, friable, irregular and PAP ([id number]) had CIN-3/CIS with suspicion for invasion. CT AP 04-06-14: 3.9 cm mass in the cervix, suspicious for cervical carcinoma; other uterine myometrial masses measuring up to 4 cm, most likely representing fibroids,7.6 cm complex cystic lesion in right adnexa and cul-de-sac. She saw Dr Skeet Latch 04-12-14, with cervical biopsy ([id number] ) documenting invasive squamous cell carcinoma. She went to  surgery at [facility] 04-21-14 by Dr Skeet Latch, this robotic radical hysterectomy with upper vaginectomy, BSO and bilateral pelvic lymphadenectomy, with barrel shaped cervix, 2 cm lesion on cervix, uterus slightly enlarged and large right ovarian cyst. .Pathology from [facility] 04-21-14 [id number]) cervix with moderately to poorly differentiated squamous cell carcinoma, circumferential within cervix and involving anterior and posterior lower uterine segments and endometrium, invading into myometrium and extensively into bilateral parametrial soft tissue. Perineural and LVSI present as well as large vessel involvement. Surgical margins 3 mm from anterior deep cervical stromal margin and vaginal cuff widely negative. * left pelvic nodes, 3 right pelvic nodes, 2 left obturator nodes and 5 parametrial nodes all negative. Right ovary and tube with serous cystadenoma. Adenomatous endometrial polyp with ulceration, also involved by squamous cell carcinoma. Pelvic washings ([id number]) no malignant cells identified. Staging was IB1.  [facility] multidisciplinary gyn oncology conference 04-28-14 recommended whole pelvic radiation with sensitizing CDDP and suggested consideration of PET CT to evaluate peri-aortic involvement given small number of nodes sampled on right. Patient had post op follow up with Dr Denman George in [place] on 04-29-14, foley removed then. She had ER visit on 10-30 due to constipation from pain medication. PET 05-04-14 showed no abdominal or pelvic hypermetabolic nodes or other evidence of metastatic disease, tho some low level hypermetabolism around pelvic bowel loops and small volume ascites thought post op or inflammation, and borderline dilated small bowel loops thought post op adynamic ileus.  She received 5 cycles of sensitizing CDDP from 06-07-14 thru 07-05-14. IMRT completed 07-13-14 for 50.4 Gy.    Review of systems as above, also: No fever or symptoms of infection. Chronic problems sleeping, worse recently as she  is drinking lots of water up until bedtime and is up to void multiple times thru night - discussed. No LE swelling. No increased SOB, cough, chest pain. No nausea or vomiting. No peripheral neuropathy Remainder of 10 point Review of Systems negative.  Objective:  Vital signs in last 24 hours:  BP 136/81 mmHg  Pulse 88  Temp(Src) 98.4 F (36.9 C) (Oral)  Resp 20  Ht _0  (1.702 m)  Wt 127 lb 6.4 oz (57.788 kg)  BMI 19.95 kg/m2  SpO2 100% Weight down 3 lbs. Alert, oriented and appropriate. Ambulatory without difficulty. Looks comfortable, respirations not labored RA.  Daughter very supportive No alopecia  HEENT:PERRL, sclerae not icteric. Oral mucosa moist without lesions, posterior pharynx clear.  Neck supple. No JVD.  Lymphatics:no cervical,supraclavicular, axillary or inguinal adenopathy Resp: clear to auscultation bilaterally and normal percussion bilaterally Cardio: regular rate and rhythm. No gallop. TO:IZTIWPYK soft, nontender, not distended, no mass or organomegaly. Normally active bowel sounds.  Musculoskeletal/ Extremities: without pitting edema, cords, tenderness Neuro: no peripheral neuropathy. Otherwise nonfocal. PSYCH appropriate mood and affect Skin without rash, ecchymosis, petechiae. No problems at sites of previous IVs.  Lab Results:  Results for orders placed or performed in visit on 07/19/14  CBC with Differential  Result Value Ref Range   WBC 1.3 (L) 3.9 - 10.3 10e3/uL   NEUT# 0.8 (L) 1.5 - 6.5 10e3/uL   HGB 11.3 (L) 11.6 - 15.9 g/dL   HCT 34.5 (L) 34.8 - 46.6 %   Platelets 186 145 - 400 10e3/uL   MCV 81.8 79.5 - 101.0 fL   MCH 26.8 25.1 - 34.0 pg   MCHC 32.8 31.5 - 36.0 g/dL   RBC 4.22 3.70 - 5.45 10e6/uL   RDW 17.7 (H) 11.2 - 14.5 %   lymph# 0.3 (L) 0.9 - 3.3 10e3/uL   MONO# 0.2 0.1 - 0.9 10e3/uL   Eosinophils Absolute 0.0 0.0 - 0.5 10e3/uL   Basophils Absolute 0.0 0.0 - 0.1 10e3/uL   NEUT% 60.0 38.4 - 76.8 %   LYMPH% 25.6 14.0 - 49.7 %   MONO%  12.8 0.0 - 14.0 %   EOS% 0.8 0.0 - 7.0 %   BASO% 0.8 0.0 - 2.0 %  Basic metabolic panel (Bmet) - [facility]  Result Value Ref Range   Sodium 144 136 - 145 mEq/L   Potassium 4.1 3.5 - 5.1 mEq/L   Chloride 106 98 - 109 mEq/L   CO2 29 22 - 29 mEq/L   Glucose 89 70 - 140 mg/dl   BUN 12.5 7.0 - 26.0 mg/dL   Creatinine 0.8 0.6 - 1.1 mg/dL   Calcium 9.2 8.4 - 10.4 mg/dL   Anion Gap 9 3 - 11 mEq/L   EGFR 89 (L) >90 ml/min/1.73 m2    UA available after visit: specific gravity 1.020, 0-2 WBC, LE negative  Last magnesium 07-05-13 2.5  Studies/Results:  No results found.  Medications: I have reviewed the patient's current medications. Fine to try benadryl at hs for sleep. Granix 300 given today  DISCUSSION: Neutropenic today post pelvic RT and chemo - discussed neutropenic precautions. Will repeat CBC next week to be sure ANC recovering. Patient to bring form then to be completed for her return to work ~ Feb 1, 6 hours daily to start and not to lift >20 lbs. Discussed smoking cessation again Discussed follow up including rad onc and gyn onc   Assessment/Plan:  1. 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 RT completed 07-13-14, follow up with Dr Sondra Come in Feb. Neutropenic today from chemo + pelvic radiation, granix given, repeat counts next week. I will see her back at least in 4 months with labs, or sooner if concerns including with repeat counts. 2.long and ongoing tobacco: she has already significantly decreased cigarettes and wants to DC entirely. 3.unintentional weight loss of ~ 20 lbs prior to cancer diagnosis: not clear that this is related to the cancer, TSH normal 03-2014. 4. HTN and elevated cholesterol recently diagnosed, being treated by PCP 5.flu vaccine done 6.overdue mammograms: appreciate outreach coordinator assisting with scholarship, mammograms to be done 07-29-14. 7.post  BTL remotely  All questions answered and patient knows that she can call at any time if needed prior to next scheduled visit.  Cc this note to PCP. Time spent 25 min including >50% counseling and coordination of care.    Gordy Levan, MD   07/19/2014, 2:28 PM

## 2014-07-21 ENCOUNTER — Encounter: Payer: Self-pay | Admitting: Oncology

## 2014-07-21 NOTE — Progress Notes (Signed)
[facility] END OF TREATMENT   Name: Martha Koch Date: 07/21/2014 MRN: [id number] DOB: Dec 01, 1955   TREATMENT DATES: 06-07-2014 thru 07-05-2014   REFERRING PHYSICIAN: [name]  DIAGNOSIS: IB1 squamous cell carcinoma of cervix   STAGE AT START OF TREATMENT: IB1   INTENT: curative   DRUGS OR REGIMENS GIVEN: sensitizing cisplatinum x 5 cycles   MAJOR TOXICITIES: neutropenia, nausea, taste changes   REASON TREATMENT STOPPED: completion of planned course   PERFORMANCE STATUS AT END: 1   ONGOING PROBLEMS: neutropenia 07-19-14  FOLLOW UP PLANS: repeat CBC. Follow up with radiation oncology, gyn oncology, medical oncology

## 2014-07-22 LAB — URINE CULTURE

## 2014-07-26 ENCOUNTER — Other Ambulatory Visit: Payer: Self-pay | Admitting: *Deleted

## 2014-07-26 ENCOUNTER — Other Ambulatory Visit (HOSPITAL_BASED_OUTPATIENT_CLINIC_OR_DEPARTMENT_OTHER): Payer: Self-pay

## 2014-07-26 ENCOUNTER — Other Ambulatory Visit: Payer: Self-pay | Admitting: Oncology

## 2014-07-26 ENCOUNTER — Telehealth: Payer: Self-pay

## 2014-07-26 DIAGNOSIS — N390 Urinary tract infection, site not specified: Secondary | ICD-10-CM

## 2014-07-26 DIAGNOSIS — C539 Malignant neoplasm of cervix uteri, unspecified: Secondary | ICD-10-CM

## 2014-07-26 LAB — CBC WITH DIFFERENTIAL/PLATELET
BASO%: 0.5 % (ref 0.0–2.0)
BASOS ABS: 0 10*3/uL (ref 0.0–0.1)
EOS%: 0.7 % (ref 0.0–7.0)
Eosinophils Absolute: 0 10*3/uL (ref 0.0–0.5)
HCT: 38.3 % (ref 34.8–46.6)
HEMOGLOBIN: 11.8 g/dL (ref 11.6–15.9)
LYMPH%: 24.2 % (ref 14.0–49.7)
MCH: 26 pg (ref 25.1–34.0)
MCHC: 30.9 g/dL — AB (ref 31.5–36.0)
MCV: 84 fL (ref 79.5–101.0)
MONO#: 0.4 10*3/uL (ref 0.1–0.9)
MONO%: 18.3 % — AB (ref 0.0–14.0)
NEUT#: 1.2 10*3/uL — ABNORMAL LOW (ref 1.5–6.5)
NEUT%: 56.3 % (ref 38.4–76.8)
PLATELETS: 216 10*3/uL (ref 145–400)
RBC: 4.56 10*6/uL (ref 3.70–5.45)
RDW: 20.2 % — AB (ref 11.2–14.5)
WBC: 2.1 10*3/uL — AB (ref 3.9–10.3)
lymph#: 0.5 10*3/uL — ABNORMAL LOW (ref 0.9–3.3)

## 2014-07-26 MED ORDER — CIPROFLOXACIN HCL 250 MG PO TABS: 250.0000 mg | ORAL_TABLET | Freq: Two times a day (BID) | ORAL | Status: DC

## 2014-07-26 NOTE — Telephone Encounter (Signed)
Martha Koch 's ANC is 1.2 today.  Dr. Marko Plume will complete forms for Martha Koch to return to work and our office will call her when completed. She continues to have a tingling/burning sensation at the end of her urine stream.  Dr. Marko Plume will begin Cipro 250 mg bid x 3 days as the urine culture from 07-19-14 shoed 40,000 colonies of Klebsiella. Requested that Martha Koch call the office on Thursday to let Dr. Marko Plume know if her  Continues with symptoms.  Martha Koch verbalized understanding.

## 2014-07-27 ENCOUNTER — Encounter: Payer: Self-pay | Admitting: Radiation Oncology

## 2014-07-27 NOTE — Progress Notes (Signed)
  Radiation Oncology         [phone] ________________________________  Name: Martha Koch MRN: [id number]  Date: 07/27/2014  DOB: [birth year]  End of Treatment Note  Diagnosis:   Stage IB1 invasive squamous carcinoma of the cervix, high risk features     Indication for treatment:  Postop to reduce chances for recurrence in the pelvis area       Radiation treatment dates:   December 2 through January 12  Site/dose:   Pelvis 45 gray in 25 fractions; vaginal cuff/parametrial boost to 50.4 gray  Beams/energy:   Helical intensity modulated radiation therapy, 6 MV photons  Narrative: The patient tolerated radiation treatment relatively well.   She had minimal problems with nausea, diarrhea and dysuria during the course for treatment.  Plan: The patient has completed radiation treatment. The patient will return to radiation oncology clinic for routine followup in one month. I advised them to call or return sooner if they have any questions or concerns related to their recovery or treatment.  -----------------------------------  Blair Promise, PhD, MD

## 2014-07-29 ENCOUNTER — Ambulatory Visit (HOSPITAL_COMMUNITY)
Admission: RE | Admit: 2014-07-29 | Discharge: 2014-07-29 | Disposition: A | Discharge status: Home / Self Care | Payer: Self-pay | Source: Ambulatory Visit | Attending: Obstetrics and Gynecology | Admitting: Obstetrics and Gynecology

## 2014-07-29 ENCOUNTER — Encounter (HOSPITAL_COMMUNITY): Payer: Self-pay

## 2014-07-29 VITALS — BP 140/92 | Temp 97.5°F | Ht 67.0 in | Wt 130.2 lb

## 2014-07-29 DIAGNOSIS — Z1231 Encounter for screening mammogram for malignant neoplasm of breast: Secondary | ICD-10-CM

## 2014-07-29 DIAGNOSIS — Z1239 Encounter for other screening for malignant neoplasm of breast: Secondary | ICD-10-CM

## 2014-07-29 NOTE — Progress Notes (Signed)
No complaints today.  Pap Smear:  Pap smear not completed today. Last Pap smear was 04/01/2014 at the [facility] with some cells present suspicious for invasion. Follow-up colposcopy showed invasive squamous cell carcinoma. Patient has a history of a hysterectomy, radiation, and chemotherapy due to cervical cancer. Patient has a history of an abnormal Pap smear in 2002 that a LEEP was completed for follow-up. The above information is in EPIC.   Physical exam: Breasts Breasts symmetrical. No skin abnormalities bilateral breasts. No nipple retraction bilateral breasts. No nipple discharge bilateral breasts. No lymphadenopathy. No lumps palpated bilateral breasts. No complaints of pain or tenderness on exam. Patient escorted to mammography for a screening mammogram.        Pelvic/Bimanual No Pap smear completed today since last Pap smear was 04/01/2014. Pap smear not indicated per BCCCP guidelines.   Smoking cessation discussed with patient. Referred patient to the [facility] Quitline and resources given for free smoking cessation classes offered at the [facility] and throughout [facility].

## 2014-07-29 NOTE — Patient Instructions (Addendum)
Explained to Martha Koch that she did not need a Pap smear today due to last Pap smear was 04/01/2014. Let [facility] know [facility] will [facility] with her within the next couple weeks with results by letter or phone. Smoking cessation discussed with patient and resources given. Martha Koch verbalized understanding.  Brannock, Arvil Chaco, RN 1:52 PM

## 2014-08-18 ENCOUNTER — Encounter: Payer: Self-pay | Admitting: Oncology

## 2014-08-19 ENCOUNTER — Ambulatory Visit
Admission: RE | Admit: 2014-08-19 | Discharge: 2014-08-19 | Disposition: A | Discharge status: Home / Self Care | Payer: Self-pay | Source: Ambulatory Visit | Attending: Radiation Oncology | Admitting: Radiation Oncology

## 2014-08-19 ENCOUNTER — Encounter: Payer: Self-pay | Admitting: Radiation Oncology

## 2014-08-19 VITALS — BP 134/93 | HR 97 | Temp 97.5°F | Resp 20 | Ht 67.0 in | Wt 132.1 lb

## 2014-08-19 DIAGNOSIS — C539 Malignant neoplasm of cervix uteri, unspecified: Secondary | ICD-10-CM

## 2014-08-19 NOTE — Progress Notes (Signed)
Radiation Oncology         [phone] ________________________________  Name: Martha Koch MRN: [id number]  Date: 08/19/2014  DOB: January 06, 1956  Follow-Up Visit Note  CC: Minerva Ends, MD  Everitt Amber, MD    ICD-9-CM ICD-10-CM   1. Cervical cancer 180.9 C53.9     Diagnosis:   Stage IB1 invasive squamous carcinoma of the cervix, high risk features  Interval Since Last Radiation:  1  months  Narrative:  The patient returns today for routine follow-up.  She is doing well at this time. She denies any nausea or abdominal cramping. She denies any hematuria dysuria vaginal bleeding or rectal bleeding. She is back to working her usual schedule and tolerating this well.                              ALLERGIES:  is allergic to aspirin and penicillins.  Meds: Current Outpatient Prescriptions  Medication Sig Dispense Refill  . amLODipine (NORVASC) 10 MG tablet Take 1 tablet (10 mg total) by mouth daily. 90 tablet 3  . Ascorbic Acid (VITAMIN C) 1000 MG tablet Take 1,000 mg by mouth daily.    Marland Kitchen atorvastatin (LIPITOR) 20 MG tablet Take 1 tablet (20 mg total) by mouth daily. 90 tablet 3  . ibuprofen (ADVIL,MOTRIN) 200 MG tablet Take 200 mg by mouth every 6 (six) hours as needed for moderate pain (pain).     . Multiple Vitamin (MULTIVITAMIN WITH MINERALS) TABS tablet Take 1 tablet by mouth daily.    . ciprofloxacin (CIPRO) 250 MG tablet Take 1 tablet (250 mg total) by mouth 2 (two) times daily. (Patient not taking: Reported on 07/29/2014) 6 tablet 0  . docusate sodium (COLACE) 100 MG capsule Take 200 mg by mouth 2 (two) times daily.     Marland Kitchen LORazepam (ATIVAN) 1 MG tablet Place 1/2 to 1 tablet under the tongue or swallow every 6 hrs as needed for nausea. (Patient not taking: Reported on 07/06/2014) 30 tablet 0  . ondansetron (ZOFRAN) 8 MG tablet Take 1 tablet (8 mg total) by mouth every 8 (eight) hours as needed for nausea or vomiting. (Patient not taking: Reported on 07/06/2014) 30 tablet 1    . oxycodone (OXY-IR) 5 MG capsule Take 1 capsule (5 mg total) by mouth every 4 (four) hours as needed for pain. (Patient not taking: Reported on 06/30/2014) 45 capsule 0  . polyethylene glycol (MIRALAX / GLYCOLAX) packet Take 17 g by mouth daily. (Patient not taking: Reported on 06/30/2014) 14 each 0   No current facility-administered medications for this encounter.    Physical Findings: The patient is in no acute distress. Patient is alert and oriented.  height is 5\' 7"  (1.702 m) and weight is 132 lb 1.6 oz (59.92 kg). Her oral temperature is 97.5 F (36.4 C). Her blood pressure is 134/93 and her pulse is 97. Her respiration is 20. Marland Kitchen  No palpable supraclavicular adenopathy. Lungs are clear to auscultation. The heart has a regular rhythm and rate. The abdomen is soft and nontender with normal bowel sounds.  Lab Findings: Lab Results  Component Value Date   WBC 2.1* 07/26/2014   HGB 11.8 07/26/2014   HCT 38.3 07/26/2014   MCV 84.0 07/26/2014   PLT 216 07/26/2014    Radiographic Findings: Ms Digital Screening Bilateral  07/30/2014   CLINICAL DATA:  Screening.  EXAM: DIGITAL SCREENING BILATERAL MAMMOGRAM WITH CAD  COMPARISON:  Previous exam(s).  ACR Breast Density Category c: The breast tissue is heterogeneously dense, which may obscure small masses.  FINDINGS: There are no findings suspicious for malignancy. Images were processed with CAD.  IMPRESSION: No mammographic evidence of malignancy. A result letter of this screening mammogram will be mailed directly to the patient.  RECOMMENDATION: Screening mammogram in one year. (Code:[id number]-B-01Y)  BI-RADS CATEGORY  1: Negative.   Electronically Signed   By: Lovey Newcomer M.D.   On: 07/30/2014 12:41    Impression:  The patient is recovering from the effects of radiation.    Plan:  Routine follow-up in July. The patient will be seen by gynecologic oncology and medical oncology in the interim.  ____________________________________ Blair Promise,  MD

## 2014-08-19 NOTE — Progress Notes (Signed)
Martha Koch here for follow up.  She denies pain.  She reports trouble sleeping.  She denies bowel issues and nausea.  She reports having trouble emptying her bladder and has to strain to empty it.  She denies vaginal and rectal bleeding.  She reports hyperpigmentation on her abdomen.  She denies fatigue.  She is working part time.    BP 134/93 mmHg  Pulse 97  Temp(Src) 97.5 F (36.4 C) (Oral)  Resp 20

## 2014-08-19 NOTE — Progress Notes (Signed)
Martha Koch was given a size M vaginal dilator and lubricant.  She was advised to apply lubricant to the dilator and to insert it 3 times a week (Monday, Wednesday, Friday) for 10 minutes and to wash with soap and water after use.  Martha Koch verbalized understanding in teach back mode.

## 2014-08-23 ENCOUNTER — Encounter: Payer: Self-pay | Admitting: *Deleted

## 2014-08-23 NOTE — Progress Notes (Signed)
Patient called to confirm appointment for 11/18/14 for labs and with Dr. Marko Plume.

## 2014-09-30 ENCOUNTER — Ambulatory Visit: Payer: Self-pay | Attending: Gynecologic Oncology | Admitting: Gynecologic Oncology

## 2014-09-30 ENCOUNTER — Other Ambulatory Visit (HOSPITAL_COMMUNITY)
Admission: RE | Admit: 2014-09-30 | Discharge: 2014-09-30 | Disposition: A | Discharge status: Home / Self Care | Payer: Self-pay | Source: Ambulatory Visit | Attending: Gynecologic Oncology | Admitting: Gynecologic Oncology

## 2014-09-30 ENCOUNTER — Encounter: Payer: Self-pay | Admitting: Gynecologic Oncology

## 2014-09-30 VITALS — BP 134/73 | HR 92 | Temp 97.7°F | Resp 18 | Ht 67.0 in | Wt 130.8 lb

## 2014-09-30 DIAGNOSIS — C539 Malignant neoplasm of cervix uteri, unspecified: Secondary | ICD-10-CM | POA: Insufficient documentation

## 2014-09-30 DIAGNOSIS — Z8541 Personal history of malignant neoplasm of cervix uteri: Secondary | ICD-10-CM

## 2014-09-30 DIAGNOSIS — Z90710 Acquired absence of both cervix and uterus: Secondary | ICD-10-CM | POA: Insufficient documentation

## 2014-09-30 DIAGNOSIS — Z01411 Encounter for gynecological examination (general) (routine) with abnormal findings: Secondary | ICD-10-CM | POA: Insufficient documentation

## 2014-09-30 DIAGNOSIS — Z923 Personal history of irradiation: Secondary | ICD-10-CM

## 2014-09-30 NOTE — Progress Notes (Signed)
GYN ONCOLOGY OFFICE VISIT  Chief complaint:  Cervical cancer surveillance  HPI:  Martha Koch is a 59 y.o. 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  She then underwent a robotic radical hysterectomy, BSO, and pelvic lymphadenectomy on 78/29/56 without complications.  Her postoperative course was uncomplicated.  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 Negative nodes. The primary tumor was a moderately to poorly differentiated tumor extending from the cervix into the lower uterine segment (6.9cm largest dimension in endometrium, 3.6cm largest dimension in the cervix). The bilateral parametrium was positive for involvement (however margins were negative). The anterior deep cervical stromal margin was close (70mm) but negative. There was LVSI present as well as perineural involvement.  Radiation treatment dates: December 2 through July 13, 2014 Site/dose: Pelvis 45 gray in 25 fractions; vaginal cuff/parametrial boost to 50.4 gray Beams/energy: Helical intensity modulated radiation therapy, 6 MV photons  Is doing well.  Review of systems: Constitutional:  She has no weight gain or weight loss. She has no fever or chills. Eyes: No blurred vision Ears, Nose, Mouth, Throat: No dizziness, headaches or changes in hearing. No mouth sores. Cardiovascular: No chest pain, palpitations or edema. Respiratory:  No shortness of breath, wheezing or cough Gastrointestinal: She has normal bowel movements without diarrhea or constipation. She denies any nausea or vomiting. She denies blood in her stool or heart burn. Genitourinary:  She denies pelvic pain, pelvic pressure.  Reports smaller urinary voids, no dribbling.  She has no hematuria, dysuria, or incontinence. She has no irregular vaginal bleeding or vaginal discharge Musculoskeletal: Denies muscle weakness or joint  pains.  Skin:  She has no skin changes, rashes or itching Neurological:  Denies dizziness or headaches. No neuropathy, no numbness or tingling. Psychiatric:  She denies depression or anxiety. Hematologic/Lymphatic:   No easy bruising or bleeding  Outpatient Encounter Prescriptions as of 09/30/2014  Medication Sig Note  . amLODipine (NORVASC) 10 MG tablet Take 1 tablet (10 mg total) by mouth daily.   . Ascorbic Acid (VITAMIN C) 1000 MG tablet Take 1,000 mg by mouth daily.   Marland Kitchen atorvastatin (LIPITOR) 20 MG tablet Take 1 tablet (20 mg total) by mouth daily.   . ciprofloxacin (CIPRO) 250 MG tablet Take 1 tablet (250 mg total) by mouth 2 (two) times daily. (Patient not taking: Reported on 07/29/2014)   . docusate sodium (COLACE) 100 MG capsule Take 200 mg by mouth 2 (two) times daily.  04/30/2014: Received from: [facility]  . ibuprofen (ADVIL,MOTRIN) 200 MG tablet Take 200 mg by mouth every 6 (six) hours as needed for moderate pain (pain).    . LORazepam (ATIVAN) 1 MG tablet Place 1/2 to 1 tablet under the tongue or swallow every 6 hrs as needed for nausea. (Patient not taking: Reported on 07/06/2014)   . Multiple Vitamin (MULTIVITAMIN WITH MINERALS) TABS tablet Take 1 tablet by mouth daily.   . ondansetron (ZOFRAN) 8 MG tablet Take 1 tablet (8 mg total) by mouth every 8 (eight) hours as needed for nausea or vomiting. (Patient not taking: Reported on 07/06/2014)   . oxycodone (OXY-IR) 5 MG capsule Take 1 capsule (5 mg total) by mouth every 4 (four) hours as needed for pain. (Patient not taking: Reported on 06/30/2014)   . polyethylene glycol (MIRALAX / GLYCOLAX) packet Take 17 g by mouth daily. (Patient not taking: Reported on 06/30/2014)    Allergies  Allergen Reactions  . Aspirin     REACTION: stomach  . Penicillins     REACTION: hives    Past Medical History  Diagnosis Date  . Hypertension   . Cancer cervical   . Radiation 06/02/14-07/13/14    pelvis 45 gray, vaginal cuff/parametrial boost  to 50.4 gray    Past Surgical History  Procedure Laterality Date  . Abdominal hysterectomy    . Robotic radical hysterectomy, bso, pelvic lymphadenectomy Bilateral 04/21/14  . Tubal ligation      Family History  Problem Relation Age of Onset  . Cancer Other   . Diabetes Other   . Hypertension Father   . Cancer Maternal Aunt     breast cancer, x 4 aunts     History   Social History  . Marital Status: Single    Spouse Name: N/A  . Number of Children: 3  . Years of Education: 12   Occupational History  . [employer]     Social History Main Topics  . Smoking status: Current Every Day Smoker -- 0.25 packs/day for 42 years    Types: Cigarettes  . Smokeless tobacco: Never Used     Comment: smokes 2-3 a day  . Alcohol Use: No  . Drug Use: No  . Sexual Activity: No   Other Topics Concern  . Not on file   Social History Narrative   Has 3 children. They live in [place].   Has 3 grandchildren.    Uvaldo Rising (oldest daughter)    Lives alone.     Physical Exam: Vitals: BP 134/73 mmHg  Pulse 92  Temp(Src) 97.7 F (36.5 C) (Oral)  Resp 18  Ht 5\' 7"  (1.702 m)  Wt 130 lb 12.8 oz (59.33 kg)  BMI 20.48 kg/m2  General: Well dressed, well nourished in no apparent distress.  Cachectic. HEENT:  Normocephalic and atraumatic, no lesions.  Extraocular muscles intact. Sclerae anicteric. Pupils equal, round, reactive. No mouth sores or ulcers. Thyroid is normal size, not nodular, midline. Skin:  No lesions or rashes. Breasts:  Soft, symmetric.  No skin or nipple changes.  No palpable LN or masses. Lungs:  Clear to auscultation bilaterally.  No wheezes. Cardiovascular:  Regular rate and rhythm.  No murmurs or rubs. Abdomen:  Soft, nontender, nondistended.  No palpable masses.  No hepatosplenomegaly.  No ascites. Normal bowel sounds.  No hernias.  Incisions are healing well Genitourinary: cuff intact, vagina foreshortened, no masses.  Pap collected Extremities: No  cyanosis, clubbing or edema.  No calf tenderness or erythema. No palpable cords. Psychiatric: Mood and affect are appropriate. Neurological: Awake, alert and oriented x 3. Sensation is intact, no neuropathy.  Musculoskeletal: No pain, normal strength and range of motion.   Assessment:    59 y.o. 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   S/p robotic radical hysterectomy with BSO and pelvic lymphadenectomy on 04/21/14. Negative nodes. The primary tumor was a moderately to poorly differentiated tumor extending from the cervix into the lower uterine segment (6.9cm largest dimension in endometrium, 3.6cm largest dimension in the cervix). The bilateral parametrium was positive for involvement (however margins were negative). The anterior deep cervical stromal margin was close (70mm) but negative. There was LVSI present as well as perineural involvement.  Has completed EBRT and vaginal bracytherapy. No evidence of disease Counseled on the signs and symptoms of recurrence  Plan:  Follow-up with Dr. Marko Plume 10/2014 Follow-up with Dr Sondra Come 12/2014 Follow-up  with Dr Skeet Latch 04/2015   [name], MD

## 2014-09-30 NOTE — Patient Instructions (Addendum)
  Follow-up with Dr. Marko Plume 10/2014 Follow-up with Dr Sondra Come 12/2014 Follow-up with Dr Skeet Latch 04/2015. We do not have our schedule for September yet. Please call us in May or June to schedule this appt.     Thank you very much Ms. [name] for allowing me to provide care for you today.  I appreciate your confidence in choosing our Gynecologic Oncology team.  If you have any questions about your visit today please call our office and we will get back to you as soon as possible.  Please consider using the website Medlineplus.gov as an Geneticist, molecular.   Francetta Found. Oney Tatlock MD., PhD Gynecologic Oncology

## 2014-09-30 NOTE — Addendum Note (Signed)
Addended by: Christa See on: 09/30/2014 03:15 PM   Modules accepted: Orders

## 2014-10-05 LAB — CYTOLOGY - PAP

## 2014-10-19 ENCOUNTER — Other Ambulatory Visit: Payer: Self-pay | Admitting: Family Medicine

## 2014-10-19 MED ORDER — IBUPROFEN 600 MG PO TABS: 600.0000 mg | ORAL_TABLET | Freq: Four times a day (QID) | ORAL | Status: DC | PRN

## 2014-11-01 ENCOUNTER — Telehealth: Payer: Self-pay | Admitting: *Deleted

## 2014-11-01 NOTE — Telephone Encounter (Signed)
Called pt , unavailable lmovm to call office for results. Message for pt: Pap smear normal

## 2014-11-16 ENCOUNTER — Other Ambulatory Visit: Payer: Self-pay | Admitting: Oncology

## 2014-11-18 ENCOUNTER — Encounter: Payer: Self-pay | Admitting: Oncology

## 2014-11-18 ENCOUNTER — Other Ambulatory Visit (HOSPITAL_BASED_OUTPATIENT_CLINIC_OR_DEPARTMENT_OTHER): Payer: Self-pay

## 2014-11-18 ENCOUNTER — Ambulatory Visit (HOSPITAL_BASED_OUTPATIENT_CLINIC_OR_DEPARTMENT_OTHER): Payer: Self-pay | Admitting: Oncology

## 2014-11-18 ENCOUNTER — Other Ambulatory Visit: Payer: Self-pay

## 2014-11-18 VITALS — BP 118/79 | HR 88 | Temp 97.8°F | Resp 18 | Ht 67.0 in | Wt 126.4 lb

## 2014-11-18 DIAGNOSIS — C539 Malignant neoplasm of cervix uteri, unspecified: Secondary | ICD-10-CM

## 2014-11-18 DIAGNOSIS — E780 Pure hypercholesterolemia: Secondary | ICD-10-CM

## 2014-11-18 DIAGNOSIS — Z720 Tobacco use: Secondary | ICD-10-CM

## 2014-11-18 DIAGNOSIS — R922 Inconclusive mammogram: Secondary | ICD-10-CM

## 2014-11-18 DIAGNOSIS — I10 Essential (primary) hypertension: Secondary | ICD-10-CM

## 2014-11-18 LAB — CBC WITH DIFFERENTIAL/PLATELET
BASO%: 0.3 % (ref 0.0–2.0)
Basophils Absolute: 0 10*3/uL (ref 0.0–0.1)
EOS ABS: 0.1 10*3/uL (ref 0.0–0.5)
EOS%: 1.6 % (ref 0.0–7.0)
HEMATOCRIT: 38.9 % (ref 34.8–46.6)
HEMOGLOBIN: 12.8 g/dL (ref 11.6–15.9)
LYMPH%: 29.9 % (ref 14.0–49.7)
MCH: 27.1 pg (ref 25.1–34.0)
MCHC: 32.9 g/dL (ref 31.5–36.0)
MCV: 82.4 fL (ref 79.5–101.0)
MONO#: 0.3 10*3/uL (ref 0.1–0.9)
MONO%: 8.1 % (ref 0.0–14.0)
NEUT#: 1.9 10*3/uL (ref 1.5–6.5)
NEUT%: 60.1 % (ref 38.4–76.8)
Platelets: 255 10*3/uL (ref 145–400)
RBC: 4.72 10*6/uL (ref 3.70–5.45)
RDW: 13.2 % (ref 11.2–14.5)
WBC: 3.1 10*3/uL — ABNORMAL LOW (ref 3.9–10.3)
lymph#: 0.9 10*3/uL (ref 0.9–3.3)

## 2014-11-18 LAB — COMPREHENSIVE METABOLIC PANEL (CC13)
ALK PHOS: 72 U/L (ref 40–150)
ALT: 12 U/L (ref 0–55)
AST: 16 U/L (ref 5–34)
Albumin: 3.8 g/dL (ref 3.5–5.0)
Anion Gap: 11 mEq/L (ref 3–11)
BUN: 13.6 mg/dL (ref 7.0–26.0)
CALCIUM: 9.7 mg/dL (ref 8.4–10.4)
CO2: 25 mEq/L (ref 22–29)
Chloride: 107 mEq/L (ref 98–109)
Creatinine: 0.8 mg/dL (ref 0.6–1.1)
EGFR: >90 mL/min/{1.73_m2} (ref >90)
Glucose: 107 mg/dl (ref 70–140)
POTASSIUM: 4 meq/L (ref 3.5–5.1)
SODIUM: 143 meq/L (ref 136–145)
TOTAL PROTEIN: 7 g/dL (ref 6.4–8.3)
Total Bilirubin: 0.26 mg/dL (ref 0.20–1.20)

## 2014-11-18 NOTE — Progress Notes (Signed)
OFFICE PROGRESS NOTE   Nov 18, 2014   Physicians:Emma Rossi/ [name], [name], [name] (PCP, [employer])  INTERVAL HISTORY:  Patient is seen, alone for visit, in follow up of CDDP chemotherapy used with radiation for IB1 squamous cell carcinoma of cervix, treatment completed 07-13-14. She was doing well at follow up with Dr Skeet Latch on 09-30-14; she saw Dr Sondra Come in [place] and will see him again in July, and Dr Skeet Latch next in October. Last imaging was PET in 05-2014.  She will set up return appointment also to PCP.  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 Energy is good and she does not have any obvious residual symptoms from the chemotherapy. She continues to smoke ~ 5 cigarettes daily, which we have discussed and I have encouraged her to set stop date. She denies bleeding, abdominal or pelvic pain, bladder symptoms, LE swelling.  No PAC  Bilateral mammograms (not tomo) done at [facility] 07[facility]30-14, with heterogeneously dense breast tissue but no other mammographic findings of concern; I have asked her to get 3D tomo with next mammograms in a year due to dense breast tissue (note this was scholarship mammogram)  [place] Patient presented to ED with 1 year of vaginal discharge/ dark spotting 02-22-14, Trichomonas +. US showed multiple uterine fibroids and 6.5 cm cyst in right ovary with thickened septation and peripheral nodularity. 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 Exam 04-01-14 was remarkable for cervix hard, friable, irregular and PAP ([id number]) had CIN-3/CIS with suspicion for invasion. CT AP 04-06-14: 3.9 cm mass in the cervix, suspicious for cervical carcinoma; other uterine myometrial masses measuring up to 4 cm, most  likely representing fibroids,7.6 cm complex cystic lesion in right adnexa and cul-de-sac. She saw Dr Skeet Latch 04-12-14, with cervical biopsy ([id number] ) documenting invasive squamous cell carcinoma. She went to surgery at [facility] 04-21-14 by Dr Skeet Latch, this robotic radical hysterectomy with upper vaginectomy, BSO and bilateral pelvic lymphadenectomy, with barrel shaped cervix, 2 cm lesion on cervix, uterus slightly enlarged and large right ovarian cyst. .Pathology from [facility] 04-21-14 [phone]) cervix with moderately to poorly differentiated squamous cell carcinoma, circumferential within cervix and involving anterior and posterior lower uterine segments and endometrium, invading into myometrium and extensively into bilateral parametrial soft tissue. Perineural and LVSI present as well as large vessel involvement. Surgical margins 3 mm from anterior deep cervical stromal margin and vaginal cuff widely negative. * left pelvic nodes, 3 right pelvic nodes, 2 left obturator nodes and 5 parametrial nodes all negative. Right ovary and tube with serous cystadenoma. Adenomatous endometrial polyp with ulceration, also involved by squamous cell carcinoma. Pelvic washings ([id number]) no malignant cells identified. Staging was IB1.  The [facility] multidisciplinary gyn oncology conference 04-28-14 recommended whole pelvic radiation with sensitizing CDDP and suggested consideration of PET CT to evaluate peri-aortic involvement given small number of nodes sampled on right. Patient had post op follow up with Dr Denman George in [place] on 04-29-14, foley removed then. She had ER visit on 10-30 due to constipation from pain medication. PET 05-04-14 showed no abdominal or pelvic hypermetabolic nodes or other evidence of metastatic disease, tho some low level hypermetabolism around pelvic bowel loops and small volume ascites thought post op or inflammation, and borderline dilated small bowel loops thought post op adynamic ileus.  She received 5  cycles of sensitizing CDDP from 06-07-14 thru 07-05-14. IMRT completed 07-13-14 for 50.4 Gy.      Review of systems as above, also:  Remainder of 10 point Review of Systems negative.  Objective:  Vital signs in last 24 hours:  BP 118/79 mmHg  Pulse 88  Temp(Src) 97.8 F (36.6 C) (Oral)  Resp 18  Ht 5' 7"  (1.702 m)  Wt 126 lb 6.4 oz (57.335 kg)  BMI 19.79 kg/m2  SpO2 100% Weight down 4 lbs  Alert, oriented and appropriate. Ambulatory without difficulty, looks entirely comfortable, very pleasant as always.    HEENT:PERRL, sclerae not icteric. Oral mucosa moist without lesions, posterior pharynx clear.  Neck supple. No JVD.  Lymphatics:no cervical,supraclavicular, axillary or inguinal adenopathy Resp: clear to auscultation bilaterally and normal percussion bilaterally Cardio: regular rate and rhythm. No gallop. GI: soft, nontender, not distended, no mass or organomegaly. Normally active bowel sounds.  Musculoskeletal/ Extremities: without pitting edema, cords, tenderness Neuro: no peripheral neuropathy. Otherwise nonfocal. PSYCH  Appropriate mood and affect Skin without rash, ecchymosis, petechiae Breasts: without dominant mass, skin or nipple findings. Axillae benign.   Lab Results:  Results for orders placed or performed in visit on 11/18/14  CBC with Differential  Result Value Ref Range   WBC 3.1 (L) 3.9 - 10.3 10e3/uL   NEUT# 1.9 1.5 - 6.5 10e3/uL   HGB 12.8 11.6 - 15.9 g/dL   HCT 38.9 34.8 - 46.6 %   Platelets 255 145 - 400 10e3/uL   MCV 82.4 79.5 - 101.0 fL   MCH 27.1 25.1 - 34.0 pg   MCHC 32.9 31.5 - 36.0 g/dL   RBC 4.72 3.70 - 5.45 10e6/uL   RDW 13.2 11.2 - 14.5 %   lymph# 0.9 0.9 - 3.3 10e3/uL   MONO# 0.3 0.1 - 0.9 10e3/uL   Eosinophils Absolute 0.1 0.0 - 0.5 10e3/uL   Basophils Absolute 0.0 0.0 - 0.1 10e3/uL   NEUT% 60.1 38.4 - 76.8 %   LYMPH% 29.9 14.0 - 49.7 %   MONO% 8.1 0.0 - 14.0 %   EOS% 1.6 0.0 - 7.0 %   BASO% 0.3 0.0 - 2.0 %  Comprehensive  metabolic panel (Cmet) - [facility]  Result Value Ref Range   Sodium 143 136 - 145 mEq/L   Potassium 4.0 3.5 - 5.1 mEq/L   Chloride 107 98 - 109 mEq/L   CO2 25 22 - 29 mEq/L   Glucose 107 70 - 140 mg/dl   BUN 13.6 7.0 - 26.0 mg/dL   Creatinine 0.8 0.6 - 1.1 mg/dL   Total Bilirubin 0.26 0.20 - 1.20 mg/dL   Alkaline Phosphatase 72 40 - 150 U/L   AST 16 5 - 34 U/L   ALT 12 0 - 55 U/L   Total Protein 7.0 6.4 - 8.3 g/dL   Albumin 3.8 3.5 - 5.0 g/dL   Calcium 9.7 8.4 - 10.4 mg/dL   Anion Gap 11 3 - 11 mEq/L   EGFR >90 >90 ml/min/1.73 m2    CBC reviewed with patient at time of visit  Studies/Results: DIGITAL [facility]ILATERAL M[facility]  COMPARISON: Previous exam(s).  ACR Breast Density Category c: The breast tissue is heterogeneously dense, which may obscure small masses.  FINDINGS: There are no findings suspicious for malignancy. Images were processed with CAD.  IMPRESSION: No mammographic evidence of malignancy. A result letter of this screening mammogram will be mailed directly to the patient.  RECOMMENDATION: Screening  mammogram in one year. (Code:SM-B-01Y)  BI-RADS CATEGORY 1: Negative  Medications: I have reviewed the patient's current medications.  DISCUSSION: blood counts have essentially recovered since treatment. With her close follow up with gyn oncology and radiation oncology, I am glad to see her back at any time if needed but have not given her a scheduled return visit.  Tobacco cessation counseling and mammogram recommendations as above.  Assessment/Plan:  1. 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 Blood counts have essentially recovered and she is otherwise doing well from my standpoint, so will see back in medical oncology on as needed basis. She will keep follow up appointments with gyn onc and rad  onc. 2.long and ongoing tobacco: has significantly decreased cigarettes, needs to DC entirely. I do not believe that she has 30+ pack year smoking required for the surveillance chest CT screening program, but will have RN speak with her to be sure. 3.unintentional weight loss of ~ 20 lbs prior to cancer diagnosis, tho very physically demanding work, and weight has been stable at 128-132 for last 6 months. 4. HTN and elevated cholesterol recently diagnosed, being treated by PCP 5.flu vaccine done 6.heterogeneously dense breast tissue, otherwise mammograms ok 07-30-14. Recommended 3D tomo with next imaging. 7.post BTL remotely  All questions answered and patient understands recommendations and follow up planned. Time spent 20 min including >50% counseling and coordination of care. CC PCP.    Martha Koch P, MD   11/18/2014, 4:44 PM

## 2014-11-20 DIAGNOSIS — Z720 Tobacco use: Secondary | ICD-10-CM | POA: Insufficient documentation

## 2014-11-20 DIAGNOSIS — R922 Inconclusive mammogram: Secondary | ICD-10-CM | POA: Insufficient documentation

## 2014-11-20 DIAGNOSIS — C539 Malignant neoplasm of cervix uteri, unspecified: Secondary | ICD-10-CM | POA: Insufficient documentation

## 2014-11-25 ENCOUNTER — Telehealth: Payer: Self-pay | Admitting: Nurse Practitioner

## 2014-11-25 NOTE — Telephone Encounter (Signed)
Patient's voicemail identifies herself. lmovm results are normal per Joylene John, NP.

## 2014-11-26 ENCOUNTER — Telehealth: Payer: Self-pay

## 2014-11-26 NOTE — Telephone Encounter (Signed)
Left message in vm on home phone requesting she call back to discuss smoking history-number of cigarettes/packs per day and how many years to determine how many pack years she has smoked.

## 2014-11-26 NOTE — Telephone Encounter (Signed)
-----   Message from Gordy Levan, MD sent at 11/20/2014 12:15 PM EDT ----- Regarding: lung cancer screening program  May be eligible for new lung cancer screening program with low dose CTs.  Has to have smoked 30+ pack years. Could you please call patient and try to get best estimate of smoking, back to when she started. It may be that she has not had total 30 pack years, if she really only smoked <=5 cigarettes daily for the 40+ years that I think she has smoked. Need to know how many cigarettes or how many packs per day or per week, and # of years smoked. If seems close to 30+ pack years, or if questions about this, please refer to or ask Eric Form, RN [phone], as she is in charge of the screening program  Fine to work on this any time in next few weeks when able thanks

## 2014-12-02 ENCOUNTER — Telehealth: Payer: Self-pay | Admitting: *Deleted

## 2014-12-02 NOTE — Telephone Encounter (Signed)
Called pt to follow up concerning her smoking history. No answer but left a detailed message for pt to call this nurse back @ [phone]. I also called her mobile phone but it will not allow you to leave a message. Pt works at [employer] but the work number will not connect you directly to her. Hopefully the pt will call back.

## 2015-01-06 ENCOUNTER — Ambulatory Visit
Admission: RE | Admit: 2015-01-06 | Discharge: 2015-01-06 | Disposition: A | Discharge status: Home / Self Care | Payer: Self-pay | Source: Ambulatory Visit | Attending: Radiation Oncology | Admitting: Radiation Oncology

## 2015-01-06 ENCOUNTER — Encounter: Payer: Self-pay | Admitting: Radiation Oncology

## 2015-01-06 VITALS — BP 130/70 | HR 79 | Temp 97.8°F | Resp 12 | Ht 67.0 in | Wt 126.5 lb

## 2015-01-06 DIAGNOSIS — C539 Malignant neoplasm of cervix uteri, unspecified: Secondary | ICD-10-CM

## 2015-01-06 NOTE — Progress Notes (Addendum)
Radiation Oncology         [phone] ________________________________  Name: Martha Koch MRN: [id number]  Date: 01/06/2015  DOB: 1956/04/09  Follow-Up Visit Note  CC: Minerva Ends, MD  Everitt Amber, MD   Diagnosis:   Stage IB1 invasive squamous carcinoma of the cervix, high risk features  Interval Since Last Radiation:  6  months  Narrative:  The patient returns today for routine follow-up. She denies any vaginal bleeding, rectal bleeding, hematuria. She uses her vag dilator once a week with no bleeding. She experiences some discomfort using the medium vaginal dilator with lubricant due to tightness. She noticed the knot in her abdomen about 3 weeks ago, it does not come out more when she stands up or coughs. She denies pain. She has an appointment with Dr. Denman George for this on 01/13/15. She denies any bladder or bowel issues, vaginal bleeding/discharge, nausea and fatigue. She is working full time. She is using her vaginal dilator once a week and have recommended she use 3/wk.                            ALLERGIES:  is allergic to aspirin and penicillins.  Meds: Current Outpatient Prescriptions  Medication Sig Dispense Refill  . amLODipine (NORVASC) 10 MG tablet Take 1 tablet (10 mg total) by mouth daily. 90 tablet 3  . Ascorbic Acid (VITAMIN C) 1000 MG tablet Take 1,000 mg by mouth daily.    Marland Kitchen atorvastatin (LIPITOR) 20 MG tablet Take 1 tablet (20 mg total) by mouth daily. 90 tablet 3  . ibuprofen (ADVIL,MOTRIN) 600 MG tablet Take 1 tablet (600 mg total) by mouth every 6 (six) hours as needed for moderate pain (pain). 20 tablet 1  . LORazepam (ATIVAN) 1 MG tablet Place 1/2 to 1 tablet under the tongue or swallow every 6 hrs as needed for nausea. (Patient not taking: Reported on 07/06/2014) 30 tablet 0  . Multiple Vitamin (MULTIVITAMIN WITH MINERALS) TABS tablet Take 1 tablet by mouth daily.    . ondansetron (ZOFRAN) 8 MG tablet Take 1 tablet (8 mg total) by mouth every 8  (eight) hours as needed for nausea or vomiting. (Patient not taking: Reported on 07/06/2014) 30 tablet 1   No current facility-administered medications for this encounter.    Physical Findings: The patient is in no acute distress. Patient is alert and oriented.  height is 5\' 7"  (1.702 m) and weight is 126 lb 8 oz (57.38 kg). Her oral temperature is 97.8 F (36.6 C). Her blood pressure is 130/70 and her pulse is 79. Her respiration is 12. .  No palpable supraclavicular adenopathy. Lungs are clear to auscultation. The heart has a regular rhythm and rate. The abdomen has port sites from laparoscopy.  in the left upper quadrant, there is thickening/induration underneath port site 3 x 2 cm in size. No inguinal adenopathy   Lab Findings: Lab Results  Component Value Date   WBC 3.1* 11/18/2014   HGB 12.8 11/18/2014   HCT 38.9 11/18/2014   MCV 82.4 11/18/2014   PLT 255 11/18/2014    Radiographic Findings: No results found.  Impression:   Stage IB1 invasive squamous carcinoma of the cervix, high risk features. Clinically doing well except for induration at the left upper abdominal port site. This could be possibly scar tissue or recurrence.She will see Dr. Denman George later this month for further evaluation of this new issue  Plan:  Routine follow up  scheduled for 6 months. She will see Dr. Skeet Latch in September for routine follow-up. Will give her an S plus vaginal dilator today. I did not perform a pelvic exam/pap smear today at the patients request in light of her appointment with Dr. Denman George 7/14.  This document serves as a record of services personally performed by Gery Pray, MD. It was created on his behalf by Arlyce Harman, a trained medical scribe. The creation of this record is based on the scribe's personal observations and the provider's statements to them. This document has been checked and approved by the attending provider. ___________________________________  Blair Promise,  MD

## 2015-01-06 NOTE — Progress Notes (Signed)
Martha Koch here for follow up.  She denies pain.  She noticed a lump in her left upper abdomen about 3 weeks ago.  She has an appointment with Dr. Denman George for this on 01/13/15.  She denies any bladder or bowel issues, vaginal bleeding/discharge, nausea and fatigue.  She is working full time.  She is using her vaginal dilator once a week.  BP 130/70 mmHg  Pulse 79  Temp(Src) 97.8 F (36.6 C) (Oral)  Resp 12  Ht 5\' 7"  (1.702 m)  Wt 126 lb 8 oz (57.38 kg)  BMI 19.81 kg/m2

## 2015-01-10 ENCOUNTER — Encounter: Payer: Self-pay | Admitting: Gastroenterology

## 2015-01-13 ENCOUNTER — Ambulatory Visit: Payer: Self-pay | Attending: Gynecologic Oncology | Admitting: Gynecologic Oncology

## 2015-01-13 ENCOUNTER — Encounter: Payer: Self-pay | Admitting: Gynecologic Oncology

## 2015-01-13 VITALS — BP 126/74 | HR 92 | Temp 97.9°F | Resp 18 | Ht 67.0 in | Wt 123.0 lb

## 2015-01-13 DIAGNOSIS — R1902 Left upper quadrant abdominal swelling, mass and lump: Secondary | ICD-10-CM | POA: Insufficient documentation

## 2015-01-13 DIAGNOSIS — C539 Malignant neoplasm of cervix uteri, unspecified: Secondary | ICD-10-CM | POA: Insufficient documentation

## 2015-01-13 NOTE — Progress Notes (Signed)
GYN ONCOLOGY OFFICE VISIT  Chief complaint:  New onset of left upper quadrant mass.  HPI:  Martha Koch is a 59 y.o. 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  She then underwent a robotic radical hysterectomy, BSO, and pelvic lymphadenectomy on 89/38/10 without complications.  Her postoperative course was uncomplicated.  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 Negative nodes. The primary tumor was a moderately to poorly differentiated tumor extending from the cervix into the lower uterine segment (6.9cm largest dimension in endometrium, 3.6cm largest dimension in the cervix). The bilateral parametrium was positive for involvement (however margins were negative). The anterior deep cervical stromal margin was close (30mm) but negative. There was LVSI present as well as perineural involvement.  Radiation treatment dates: December 2 through July 13, 2014 Site/dose: Pelvis 45 gray in 25 fractions; vaginal cuff/parametrial boost to 50.4 gray Beams/energy: Helical intensity modulated radiation therapy, 6 MV photons  Interval Hx:  The patient has begun noting a left upper quadrant mass in the past 1 month. She denies feeling increasing size in the lesion. She does not remember noting it before. It is not tender. She otherwise feels well and has no other symptoms concerning for recurrence.  Review of systems: Constitutional:  She has no weight gain or weight loss. She has no fever or chills. Eyes: No blurred vision Ears, Nose, Mouth, Throat: No dizziness, headaches or changes in hearing. No mouth sores. Cardiovascular: No chest pain, palpitations or edema. Respiratory:  No shortness of breath, wheezing or cough Gastrointestinal: She has normal bowel movements without diarrhea or constipation. She denies any nausea or vomiting. She denies blood in her stool or  heart burn. Genitourinary:  She denies pelvic pain, pelvic pressure.  Reports smaller urinary voids, no dribbling.  She has no hematuria, dysuria, or incontinence. She has no irregular vaginal bleeding or vaginal discharge Musculoskeletal: Denies muscle weakness or joint pains. See HPI for abdominal wall mass. Skin:  She has no skin changes, rashes or itching Neurological:  Denies dizziness or headaches. No neuropathy, no numbness or tingling. Psychiatric:  She denies depression or anxiety. Hematologic/Lymphatic:   No easy bruising or bleeding  Outpatient Encounter Prescriptions as of 01/13/2015  Medication Sig  . amLODipine (NORVASC) 10 MG tablet Take 1 tablet (10 mg total) by mouth daily.  . Ascorbic Acid (VITAMIN C) 1000 MG tablet Take 1,000 mg by mouth daily.  Marland Kitchen atorvastatin (LIPITOR) 20 MG tablet Take 1 tablet (20 mg total) by mouth daily.  Marland Kitchen ibuprofen (ADVIL,MOTRIN) 600 MG tablet Take 1 tablet (600 mg total) by mouth every 6 (six) hours as needed for moderate pain (pain).  . Multiple Vitamin (MULTIVITAMIN WITH MINERALS) TABS tablet Take 1 tablet by mouth daily.  . [DISCONTINUED] LORazepam (ATIVAN) 1 MG tablet Place 1/2 to 1 tablet under the tongue or swallow every 6 hrs as needed for nausea. (Patient not taking: Reported on 07/06/2014)  . [DISCONTINUED] ondansetron (ZOFRAN) 8 MG tablet Take 1 tablet (8 mg total) by mouth every 8 (eight) hours as needed for nausea or vomiting. (Patient not taking: Reported on 07/06/2014)   No facility-administered encounter medications on file as of 01/13/2015.   Allergies  Allergen Reactions  . Aspirin     REACTION: stomach  . Penicillins     REACTION: hives    Past Medical History  Diagnosis Date  . Hypertension   . Cancer cervical   .  Radiation 06/02/14-07/13/14    pelvis 45 gray, vaginal cuff/parametrial boost to 50.4 gray    Past Surgical History  Procedure Laterality Date  . Abdominal hysterectomy    . Robotic radical hysterectomy, bso,  pelvic lymphadenectomy Bilateral 04/21/14  . Tubal ligation      Family History  Problem Relation Age of Onset  . Cancer Other   . Diabetes Other   . Hypertension Father   . Cancer Maternal Aunt     breast cancer, x 4 aunts     History   Social History  . Marital Status: Single    Spouse Name: N/A  . Number of Children: 3  . Years of Education: 12   Occupational History  . [employer]     Social History Main Topics  . Smoking status: Current Every Day Smoker -- 0.25 packs/day for 42 years    Types: Cigarettes  . Smokeless tobacco: Never Used     Comment: smokes 2-3 a day  . Alcohol Use: No  . Drug Use: No  . Sexual Activity: No   Other Topics Concern  . None   Social History Narrative   Has 3 children. They live in [place].   Has 3 grandchildren.    Uvaldo Rising (oldest daughter)    Lives alone.     Physical Exam: Vitals: BP 126/74 mmHg  Pulse 92  Temp(Src) 97.9 F (36.6 C) (Oral)  Resp 18  Ht 5\' 7"  (1.702 m)  Wt 123 lb (55.792 kg)  BMI 19.26 kg/m2  General: Well dressed, well nourished in no apparent distress.  Cachectic. HEENT:  Normocephalic and atraumatic, no lesions.  Extraocular muscles intact. Sclerae anicteric. Pupils equal, round, reactive. No mouth sores or ulcers. Thyroid is normal size, not nodular, midline. Skin:  No lesions or rashes. Breasts: deferred Lungs:  Clear to auscultation bilaterally.  No wheezes. Cardiovascular:  Regular rate and rhythm.  No murmurs or rubs. Abdomen:  Soft, nontender, nondistended.  A 2-3cm firm mass is appreciated under the left upper quadrant (palmer's point) port site. It feels to be within the muscle wall (not subcutaneous). It is nontender.  No hepatosplenomegaly.  No ascites. Normal bowel sounds.  No hernias.  Genitourinary: cuff intact, vagina foreshortened, no masses.   Extremities: No cyanosis, clubbing or edema.  No calf tenderness or erythema. No palpable cords. Psychiatric: Mood and  affect are appropriate. Neurological: Awake, alert and oriented x 3. Sensation is intact, no neuropathy.  Musculoskeletal: No pain, normal strength and range of motion.   Assessment:    59 y.o. 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   S/p robotic radical hysterectomy with BSO and pelvic lymphadenectomy on 04/21/14. Negative nodes. The primary tumor was a moderately to poorly differentiated tumor extending from the cervix into the lower uterine segment (6.9cm largest dimension in endometrium, 3.6cm largest dimension in the cervix). The bilateral parametrium was positive for involvement (however margins were negative). The anterior deep cervical stromal margin was close (71mm) but negative. There was LVSI present as well as perineural involvement.  s/p EBRT and vaginal bracytherapy.  Concern for port site recurrence (vs scar hypertrophy vs hernia) on exam of left upper quadrant. PET/CT to delineate lesion (and other sites of metastasis). If concerning on PET, will schedule patient for US guided FNA of the lesion.  Plan: Follow-up with Dr Skeet Latch 03/2015   Donaciano Eva, MD

## 2015-01-13 NOTE — Patient Instructions (Addendum)
Plan for PET scan - we will call you with the appointment. We will call you with the results of the scan. Please call our office sooner with any questions or concerns.

## 2015-01-21 ENCOUNTER — Ambulatory Visit (HOSPITAL_COMMUNITY)
Admission: RE | Admit: 2015-01-21 | Discharge: 2015-01-21 | Disposition: A | Discharge status: Home / Self Care | Payer: Self-pay | Source: Ambulatory Visit | Attending: Gynecologic Oncology | Admitting: Gynecologic Oncology

## 2015-01-21 DIAGNOSIS — C539 Malignant neoplasm of cervix uteri, unspecified: Secondary | ICD-10-CM | POA: Insufficient documentation

## 2015-01-21 DIAGNOSIS — J439 Emphysema, unspecified: Secondary | ICD-10-CM | POA: Insufficient documentation

## 2015-01-21 DIAGNOSIS — R935 Abnormal findings on diagnostic imaging of other abdominal regions, including retroperitoneum: Secondary | ICD-10-CM | POA: Insufficient documentation

## 2015-01-21 DIAGNOSIS — R918 Other nonspecific abnormal finding of lung field: Secondary | ICD-10-CM | POA: Insufficient documentation

## 2015-01-21 LAB — GLUCOSE, CAPILLARY: Glucose-Capillary: 104 mg/dL — ABNORMAL HIGH (ref 65–99)

## 2015-01-21 MED ORDER — FLUDEOXYGLUCOSE F - 18 (FDG) INJECTION
6.1000 | Freq: Once | INTRAVENOUS | Status: AC | PRN
  Administered 2015-01-21: 6.1 via INTRAVENOUS

## 2015-01-24 ENCOUNTER — Telehealth: Payer: Self-pay | Admitting: *Deleted

## 2015-01-24 NOTE — Telephone Encounter (Signed)
On 01-24-15 [place] [facility] [place] [facility] it was consult note, end of tx note, follow up note.

## 2015-02-14 ENCOUNTER — Telehealth: Payer: Self-pay | Admitting: Gynecologic Oncology

## 2015-02-14 NOTE — Telephone Encounter (Signed)
informd patient of PET findings of recurrence at LUQ port site and suspicious lung nodules.  Recommendation from [facility] tumor board at [place] was to perform local resection of mass and follow this with radiation to the abdominal wall.  Recommend followup imaging for lungs in 3 months and intervention with chemotherapy if lung lesions increasing in size.  We will notify patient of her surgical scheduling. Donaciano Eva, MD

## 2015-02-16 ENCOUNTER — Telehealth: Payer: Self-pay | Admitting: *Deleted

## 2015-02-16 NOTE — Telephone Encounter (Signed)
Spoke with Katharine Look at [facility]. She states patient is scheduled for surgery next Tuesday, 02-22-15, at [facility] with Dr. Skeet Latch. Katharine Look agreeable to notify patient and to schedule her pre-op appointment as well.

## 2015-02-24 ENCOUNTER — Other Ambulatory Visit: Payer: Self-pay | Admitting: Nurse Practitioner

## 2015-02-24 DIAGNOSIS — C539 Malignant neoplasm of cervix uteri, unspecified: Secondary | ICD-10-CM

## 2015-02-24 NOTE — Progress Notes (Signed)
Per Dr. Skeet Latch, patient to be reevaluated by Dr. Sondra Come for further treatment; apt date and time given to patient 03/02/15 at 9:45. Per MD, 3 month PET scan scheduled on 04/27/15 at [facility] at 8:30. Notice sent to Benedetto Goad on 02/24/15 for prior auth. Patient notified of the above appointments and verbalizes understanding. She states she is "doing ok" after surgery yesterday and has been up to ambulate. She notes numbness at umbilicus, which, per Lenna Sciara, NP, is likely related to anesthetic. She is informed to monitor the area and call clinic if sensation does not return in a few days. She verbalizes understanding and thanks for the call.

## 2015-02-28 NOTE — Progress Notes (Addendum)
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  She takes ibuprofen 600 mg 3 times a day and oxycodone/acetaminophen 2 tablets daily.  SAFETY ISSUES:  Prior radiation? 06/02/14-07/13/14 pelvis 45 gray, vaginal cuff/parametrial boost to 50.4 gray  Pacemaker/ICD? no  Possible current pregnancy? no  Is the patient on methotrexate? no  Additional Complaints / other details: Patient is here with her daughter.  She is recovering from surgery last week.  She denies bowel or bladder issues, nausea, and vaginal/rectal bleeding.    BP 154/77 mmHg  Pulse 55  Temp(Src) 97.7 F (36.5 C) (Oral)  Resp 16  Ht 5\' 7"  (1.702 m)  Wt 122 lb 14.4 oz (55.747 kg)  BMI 19.24 kg/m2  SpO2 100%

## 2015-03-02 ENCOUNTER — Ambulatory Visit
Admission: RE | Admit: 2015-03-02 | Discharge: 2015-03-02 | Disposition: A | Discharge status: Home / Self Care | Payer: Medicaid Other | Source: Ambulatory Visit | Attending: Radiation Oncology | Admitting: Radiation Oncology

## 2015-03-02 ENCOUNTER — Encounter: Payer: Self-pay | Admitting: Oncology

## 2015-03-02 ENCOUNTER — Encounter: Payer: Self-pay | Admitting: Radiation Oncology

## 2015-03-02 ENCOUNTER — Ambulatory Visit: Payer: Medicaid Other | Admitting: Radiation Oncology

## 2015-03-02 VITALS — BP 154/77 | HR 55 | Temp 97.7°F | Resp 16 | Ht 67.0 in | Wt 122.9 lb

## 2015-03-02 DIAGNOSIS — C494 Malignant neoplasm of connective and soft tissue of abdomen: Secondary | ICD-10-CM | POA: Insufficient documentation

## 2015-03-02 DIAGNOSIS — Z886 Allergy status to analgesic agent status: Secondary | ICD-10-CM | POA: Diagnosis not present

## 2015-03-02 DIAGNOSIS — C539 Malignant neoplasm of cervix uteri, unspecified: Secondary | ICD-10-CM

## 2015-03-02 DIAGNOSIS — Z880 Allergy status to penicillin: Secondary | ICD-10-CM | POA: Diagnosis not present

## 2015-03-02 DIAGNOSIS — Z8541 Personal history of malignant neoplasm of cervix uteri: Secondary | ICD-10-CM | POA: Diagnosis present

## 2015-03-02 DIAGNOSIS — Z510 Encounter for antineoplastic radiation therapy: Secondary | ICD-10-CM | POA: Insufficient documentation

## 2015-03-02 DIAGNOSIS — R1902 Left upper quadrant abdominal swelling, mass and lump: Secondary | ICD-10-CM

## 2015-03-02 DIAGNOSIS — Z923 Personal history of irradiation: Secondary | ICD-10-CM | POA: Diagnosis not present

## 2015-03-02 NOTE — Progress Notes (Signed)
Pt came in with financial concerns.  She is applying for Medicaid as well as a discount thru the hospital.  Her app with me is pending for additional info, once received I will process her application.  She had the [facility] and [employer] but has depleted funds in both.  She has completed application to [employer] which I faxed today.  She may reach out to [employer] or Abby Potash for additional assistance.

## 2015-03-02 NOTE — Progress Notes (Signed)
Please see the Nurse Progress Note in the MD Initial Consult Encounter for this patient. 

## 2015-03-02 NOTE — Progress Notes (Signed)
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 Induration at the left upper abdominal port site indicated scar tissue or recurrence. She saw Dr. Denman George on 01/13/15. This tissue was excised on 02/23/15 at [facility] and sent for biopsy. The biopsy revealed squamous cell carcinoma. She was referred to me for radiation to the surgical bed.  Recommendation from [facility] tumor board at [place] was to perform local resection of mass and follow this with radiation to the abdominal wall.  Recommend followup imaging for lungs in 3 months and intervention with chemotherapy if lung lesions increasing in size.  Biopsies revealed  02/23/15  Specimen: Abdominal, abdominal wall mass  Final Diagnosis  A: Abdominal wall mass, excision - Skin and subcutaneous tissue with metastatic squamous cell carcinoma - Tumor size 6 cm - Surgical margins free  Past/Anticipated chemotherapy by medical oncology, if any: no  Patient's main complaints related to symptomatic tumor(s) are:  Pain on a scale of 0-10 is: 5/10 from surgery. She takes ibuprofen 600 mg 3 times a day and oxycodone/acetaminophen 2 tablets daily.  SAFETY ISSUES:  Prior radiation? 06/02/14-07/13/14 pelvis 45 gray, vaginal cuff/parametrial boost to 50.4 gray Additional Complaints / other details: Patient is here with her daughter. She is recovering from surgery last week. She denies bowel or  bladder issues, nausea, and vaginal/rectal bleeding.   ALLERGIES:  is allergic to aspirin and penicillins.  Meds: Current Outpatient Prescriptions  Medication Sig Dispense Refill  . amLODipine (NORVASC) 10 MG tablet Take 1 tablet (10 mg total) by mouth daily. 90 tablet 3  . atorvastatin (LIPITOR) 20 MG tablet Take 1 tablet (20 mg total) by mouth daily. 90 tablet 3  . docusate sodium (COLACE) 100 MG capsule Take 100 mg by mouth.    . enoxaparin (LOVENOX) 80 MG/0.8ML injection Inject 40 mg into the skin.    Marland Kitchen ibuprofen (ADVIL,MOTRIN) 600 MG tablet Take 1 tablet (600 mg total) by mouth every 6 (six) hours as needed for moderate pain (pain). 20 tablet 1  . oxyCODONE-acetaminophen (ROXICET) 5-325 MG per tablet Take by mouth.    . Ascorbic Acid (VITAMIN C) 1000 MG tablet Take 1,000 mg by mouth daily.    . Multiple Vitamin (MULTIVITAMIN WITH MINERALS) TABS tablet Take 1 tablet by mouth daily.     No current facility-administered medications for this encounter.    Physical Findings: The patient is in no acute distress. Patient is alert and oriented.  height is 5\' 7"  (1.702 m) and weight is 122 lb 14.4 oz (55.747 kg). Her oral temperature is 97.7 F (36.5 C). Her blood pressure is 154/77 and her pulse is 55. Her respiration is 16 and oxygen saturation is 100%.  General: Alert and oriented, in no acute distress HEENT: Head is normocephalic. Extraocular movements are intact. Oropharynx is clear. Neck: Neck is supple, no palpable cervical or supraclavicular lymphadenopathy. Heart: Regular in rate and rhythm with no murmurs,  rubs, or gallops. Chest: Clear to auscultation bilaterally, with no rhonchi, wheezes, or rales. Abdomen: Soft, nontender, nondistended, with no rigidity or guarding. 9 cm horizontal scar in the left upper quadrant, healing well without signs of drainage or infection Extremities: No cyanosis or edema. Lymphatics: see Neck Exam Skin: No concerning lesions. Musculoskeletal:  symmetric strength and muscle tone throughout. Neurologic: Cranial nerves II through XII are grossly intact. No obvious focalities. Speech is fluent. Coordination is intact. Psychiatric: Judgment and insight are intact. Affect is appropriate.    Lab Findings: Lab Results  Component Value Date   WBC 3.1* 11/18/2014   HGB 12.8 11/18/2014   HCT 38.9 11/18/2014   MCV 82.4 11/18/2014   PLT 255 11/18/2014    Radiographic Findings: No results found.  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 The patient's case was presented at the multidisciplinary clinic at [facility]. Recommendations for more postoperative radiation therapy directed at the surgical bed.  Plan: The pt has a f/u scheduled with Dr. Skeet Latch on 03/10/15 and a PET scan on 04/27/15. We will schedule post op radiation to the surgical bed in the abdominal region, to begin approximately 6 weeks postop.  This document serves as a record of services personally performed by Gery Pray, MD. It was created on his behalf by Darcus Austin, a trained medical scribe. The creation of this record is based on the scribe's personal observations and the provider's statements to them. This document has been checked and approved by the attending provider.  ___________________________________  Blair Promise, MD

## 2015-03-04 DIAGNOSIS — C539 Malignant neoplasm of cervix uteri, unspecified: Secondary | ICD-10-CM | POA: Insufficient documentation

## 2015-03-09 ENCOUNTER — Telehealth: Payer: Self-pay | Admitting: Oncology

## 2015-03-09 ENCOUNTER — Telehealth: Payer: Self-pay | Admitting: Nurse Practitioner

## 2015-03-09 NOTE — Telephone Encounter (Signed)
Patient calling to cancel post-op apt in [place] clinic on 03/10/15. Surgery was on 8/24 and she reports she is scheduled to see Dr. Skeet Latch at [facility] on 03/25/15. Apt cancelled at this time. She is informed to call clinic with any needs or concerns in the meantime.

## 2015-03-09 NOTE — Telephone Encounter (Signed)
Martha Koch left a message regarding her insurance paperwork.  Advised her that we are working on it and will call her when it is completed.  Martha Koch verbalized agreement.

## 2015-03-10 ENCOUNTER — Ambulatory Visit: Payer: Self-pay | Admitting: Gynecologic Oncology

## 2015-03-24 ENCOUNTER — Encounter: Payer: Self-pay | Admitting: Radiation Oncology

## 2015-03-24 NOTE — Progress Notes (Signed)
Forwarded disability paperwork to RN for processing. °

## 2015-03-29 ENCOUNTER — Ambulatory Visit
Admission: RE | Admit: 2015-03-29 | Discharge: 2015-03-29 | Disposition: A | Discharge status: Home / Self Care | Payer: Medicaid Other | Source: Ambulatory Visit | Attending: Radiation Oncology | Admitting: Radiation Oncology

## 2015-03-29 DIAGNOSIS — C539 Malignant neoplasm of cervix uteri, unspecified: Secondary | ICD-10-CM

## 2015-03-29 DIAGNOSIS — Z510 Encounter for antineoplastic radiation therapy: Secondary | ICD-10-CM | POA: Diagnosis not present

## 2015-03-29 NOTE — Progress Notes (Signed)
  Radiation Oncology         [phone] ________________________________  Name: Martha Koch MRN: [id number]  Date: 03/29/2015  DOB: 1956/05/09  SIMULATION AND TREATMENT PLANNING NOTE    ICD-9-CM ICD-10-CM   1. 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  Identity was confirmed.  All relevant records and images related to the planned course of therapy were reviewed.  The patient freely provided informed written consent to proceed with treatment after reviewing the details related to the planned course of therapy. The consent form was witnessed and verified by the simulation staff.  Then, the patient was set-up in a stable reproducible  supine position for radiation therapy.  CT images were obtained.  Surface markings were placed.  The CT images were loaded into the planning software.  Then the target and avoidance structures were contoured.  Treatment planning then occurred.  The radiation prescription was entered and confirmed.  Then, I designed and supervised the construction of a total of 1 medically necessary complex treatment devices.  I have requested : 3D Simulation  I have requested a DVH of the following structures: CTV, PTV, small bowel, kidneys, stomach.  I have ordered:dose calc.  PLAN:  The patient will receive 50.4 Gy in 28 fractions directed at the surgical bed in the left upper abdominal region.  ________________________________   Special treatment procedure note  Additional time was taken in reviewing the patient's previous treatment to assess for potential overlap current plan radiation fields and her previous treatment. Given this additional time in the potential for increased toxicity with overlapping fields, this constitutes a special treatment  procedure.   -----------------------------------  Blair Promise, PhD, MD

## 2015-03-31 ENCOUNTER — Telehealth: Payer: Self-pay | Admitting: *Deleted

## 2015-03-31 DIAGNOSIS — Z510 Encounter for antineoplastic radiation therapy: Secondary | ICD-10-CM | POA: Diagnosis not present

## 2015-03-31 NOTE — Telephone Encounter (Addendum)
Received call from patient inquiring if she needs f/u chest CT scan in December. Told patient message will be passed along to Dr. Denman George for review and will return her call.  Per Dr. Denman George, the plan is to proceed with PET scan already scheduled on 04/27/15 and then further imaging will be based off PET results. Patient states understanding. While on the phone, reviewed instructions for PET scan and patient is agreeable to appt as scheduled. Patient denies any further concerns at this time but is agreeable to call if any concerns arise before her next appt.

## 2015-04-05 ENCOUNTER — Ambulatory Visit
Admission: RE | Admit: 2015-04-05 | Discharge: 2015-04-05 | Disposition: A | Discharge status: Home / Self Care | Payer: Medicaid Other | Source: Ambulatory Visit | Attending: Radiation Oncology | Admitting: Radiation Oncology

## 2015-04-05 DIAGNOSIS — Z510 Encounter for antineoplastic radiation therapy: Secondary | ICD-10-CM | POA: Diagnosis not present

## 2015-04-06 ENCOUNTER — Ambulatory Visit
Admission: RE | Admit: 2015-04-06 | Discharge: 2015-04-06 | Disposition: A | Discharge status: Home / Self Care | Payer: Medicaid Other | Source: Ambulatory Visit | Attending: Radiation Oncology | Admitting: Radiation Oncology

## 2015-04-06 ENCOUNTER — Encounter: Payer: Self-pay | Admitting: *Deleted

## 2015-04-06 DIAGNOSIS — Z510 Encounter for antineoplastic radiation therapy: Secondary | ICD-10-CM | POA: Diagnosis not present

## 2015-04-07 ENCOUNTER — Encounter: Payer: Self-pay | Admitting: Radiation Oncology

## 2015-04-07 ENCOUNTER — Ambulatory Visit
Admission: RE | Admit: 2015-04-07 | Discharge: 2015-04-07 | Disposition: A | Discharge status: Home / Self Care | Payer: Medicaid Other | Source: Ambulatory Visit | Attending: Radiation Oncology | Admitting: Radiation Oncology

## 2015-04-07 DIAGNOSIS — Z510 Encounter for antineoplastic radiation therapy: Secondary | ICD-10-CM | POA: Diagnosis not present

## 2015-04-07 NOTE — Progress Notes (Signed)
Paperwork faxed to [employer] @ [phone] on 04/07/15, confirmation received Ardeen Fillers)

## 2015-04-07 NOTE — Progress Notes (Signed)
Faxed paperwork to [employer] @ [phone] on 04/07/15, confirmation received Ardeen Fillers)

## 2015-04-08 ENCOUNTER — Ambulatory Visit
Admission: RE | Admit: 2015-04-08 | Discharge: 2015-04-08 | Disposition: A | Discharge status: Home / Self Care | Payer: Medicaid Other | Source: Ambulatory Visit | Attending: Radiation Oncology | Admitting: Radiation Oncology

## 2015-04-08 DIAGNOSIS — Z510 Encounter for antineoplastic radiation therapy: Secondary | ICD-10-CM | POA: Diagnosis not present

## 2015-04-11 ENCOUNTER — Ambulatory Visit
Admission: RE | Admit: 2015-04-11 | Discharge: 2015-04-11 | Disposition: A | Discharge status: Home / Self Care | Payer: Medicaid Other | Source: Ambulatory Visit | Attending: Radiation Oncology | Admitting: Radiation Oncology

## 2015-04-11 DIAGNOSIS — Z510 Encounter for antineoplastic radiation therapy: Secondary | ICD-10-CM | POA: Diagnosis not present

## 2015-04-12 ENCOUNTER — Encounter: Payer: Self-pay | Admitting: Radiation Oncology

## 2015-04-12 ENCOUNTER — Ambulatory Visit
Admission: RE | Admit: 2015-04-12 | Discharge: 2015-04-12 | Disposition: A | Discharge status: Home / Self Care | Payer: Medicaid Other | Source: Ambulatory Visit | Attending: Radiation Oncology | Admitting: Radiation Oncology

## 2015-04-12 ENCOUNTER — Ambulatory Visit
Admission: RE | Admit: 2015-04-12 | Discharge: 2015-04-12 | Disposition: A | Discharge status: Home / Self Care | Payer: Self-pay | Source: Ambulatory Visit | Attending: Radiation Oncology | Admitting: Radiation Oncology

## 2015-04-12 VITALS — BP 148/81 | HR 83 | Temp 97.7°F | Ht 67.0 in | Wt 128.3 lb

## 2015-04-12 DIAGNOSIS — Z510 Encounter for antineoplastic radiation therapy: Secondary | ICD-10-CM | POA: Diagnosis not present

## 2015-04-12 DIAGNOSIS — C539 Malignant neoplasm of cervix uteri, unspecified: Secondary | ICD-10-CM

## 2015-04-12 NOTE — Progress Notes (Signed)
[employer] returned paperwork wanting beginning date and ending date of disability, given to RN to handle, 04/12/15 Ardeen Fillers)

## 2015-04-12 NOTE — Progress Notes (Addendum)
Martha Koch has received 5 fractions to her abdomen. Denies any pain , nor nausea at this time.  She states she "eats all day".   She already has a Radiation Therapy and You Booklet.  She states this is her 2nd time having treatment and still has her booklet with side-effect management.  Pt here for patient teaching.  Pt given Radiation and You booklet and skin care instructions. Pt reports they have watched, not watched and refused to watch the Radiation Therapy Education video on April 12, 2015.  Reviewed areas of pertinence such as diarrhea, fatigue, nausea and vomiting and skin changes . Pt able to give teach back of to pat skin, use unscented/gentle soap, use baby wipes, have Imodium on hand, drink plenty of water and sitz bath,. Pt demonstrated understanding of information given and will contact nursing with any questions or concerns.

## 2015-04-12 NOTE — Progress Notes (Signed)
  Radiation Oncology         [phone] ________________________________  Name: Martha Koch MRN: [id number]  Date: 04/12/2015  DOB: 1956/04/26  Weekly Radiation Therapy Management    ICD-9-CM ICD-10-CM   1. Recurrent cervical cancer (HCC) 180.9 C53.9      Current Dose: 9 Gy     Planned Dose:  50.4 Gy  Narrative . . . . . . . . The patient presents for routine under treatment assessment.                                   The patient is without complaint.  Martha Koch has received 5 fractions to her abdomen. Denies any pain , nor nausea at this time. She states she "eats all day". She already has a Radiation Therapy and You Booklet. She states this is her 2nd time having treatment and still has her booklet with side-effect management.                                      Set-up films were reviewed.                                 The chart was checked. Physical Findings. . .  height is 5\' 7"  (1.702 m) and weight is 128 lb 4.8 oz (58.196 kg). Her temperature is 97.7 F (36.5 C). Her blood pressure is 148/81 and her pulse is 83. . Weight essentially stable.  No significant changes. The lungs are clear. The heart has a regular rhythm and rate. The abdomen is soft and nontender with normal bowel sounds. Abdominal scar is well-healed without signs of drainage or infection. Scar tissue noted around the abdominal scar. Impression . . . . . . . The patient is tolerating radiation. Plan . . . . . . . . . . . . Continue treatment as planned.  ________________________________   Blair Promise, PhD, MD

## 2015-04-13 ENCOUNTER — Ambulatory Visit
Admission: RE | Admit: 2015-04-13 | Discharge: 2015-04-13 | Disposition: A | Discharge status: Home / Self Care | Payer: Medicaid Other | Source: Ambulatory Visit | Attending: Radiation Oncology | Admitting: Radiation Oncology

## 2015-04-13 DIAGNOSIS — Z510 Encounter for antineoplastic radiation therapy: Secondary | ICD-10-CM | POA: Diagnosis not present

## 2015-04-14 ENCOUNTER — Encounter: Payer: Self-pay | Admitting: Radiation Oncology

## 2015-04-14 ENCOUNTER — Ambulatory Visit
Admission: RE | Admit: 2015-04-14 | Discharge: 2015-04-14 | Disposition: A | Discharge status: Home / Self Care | Payer: Medicaid Other | Source: Ambulatory Visit | Attending: Radiation Oncology | Admitting: Radiation Oncology

## 2015-04-14 DIAGNOSIS — Z510 Encounter for antineoplastic radiation therapy: Secondary | ICD-10-CM | POA: Diagnosis not present

## 2015-04-14 NOTE — Progress Notes (Signed)
Paperwork ([employer]) received from RN, ready for scanning, 04/14/15 Ardeen Fillers)

## 2015-04-14 NOTE — Progress Notes (Signed)
Faxed paper work to [employer] @ [phone] attention Ardis Rowan, confirmation received 04/14/15 Ardeen Fillers)

## 2015-04-15 ENCOUNTER — Ambulatory Visit
Admission: RE | Admit: 2015-04-15 | Discharge: 2015-04-15 | Disposition: A | Discharge status: Home / Self Care | Payer: Medicaid Other | Source: Ambulatory Visit | Attending: Radiation Oncology | Admitting: Radiation Oncology

## 2015-04-15 DIAGNOSIS — Z510 Encounter for antineoplastic radiation therapy: Secondary | ICD-10-CM | POA: Diagnosis not present

## 2015-04-18 ENCOUNTER — Ambulatory Visit
Admission: RE | Admit: 2015-04-18 | Discharge: 2015-04-18 | Disposition: A | Discharge status: Home / Self Care | Payer: Medicaid Other | Source: Ambulatory Visit | Attending: Radiation Oncology | Admitting: Radiation Oncology

## 2015-04-18 DIAGNOSIS — Z510 Encounter for antineoplastic radiation therapy: Secondary | ICD-10-CM | POA: Diagnosis not present

## 2015-04-19 ENCOUNTER — Encounter: Payer: Self-pay | Admitting: Radiation Oncology

## 2015-04-19 ENCOUNTER — Ambulatory Visit
Admission: RE | Admit: 2015-04-19 | Discharge: 2015-04-19 | Disposition: A | Discharge status: Home / Self Care | Payer: Self-pay | Source: Ambulatory Visit | Attending: Radiation Oncology | Admitting: Radiation Oncology

## 2015-04-19 ENCOUNTER — Ambulatory Visit
Admission: RE | Admit: 2015-04-19 | Discharge: 2015-04-19 | Disposition: A | Discharge status: Home / Self Care | Payer: Medicaid Other | Source: Ambulatory Visit | Attending: Radiation Oncology | Admitting: Radiation Oncology

## 2015-04-19 VITALS — BP 143/77 | HR 82 | Temp 97.5°F | Resp 16 | Wt 128.7 lb

## 2015-04-19 DIAGNOSIS — Z510 Encounter for antineoplastic radiation therapy: Secondary | ICD-10-CM | POA: Diagnosis not present

## 2015-04-19 DIAGNOSIS — C539 Malignant neoplasm of cervix uteri, unspecified: Secondary | ICD-10-CM

## 2015-04-19 NOTE — Progress Notes (Signed)
Weekly rad txs abdomen 10/28 completed, no nausea, c/o soreness left abdominal side where surgery was and numbness, appetite good, energy level good, regular bowel movements,  BP 143/77 mmHg  Pulse 82  Temp(Src) 97.5 F (36.4 C) (Oral)  Resp 16  Wt 128 lb 11.2 oz (58.378 kg)  Wt Readings from Last 3 Encounters:  04/19/15 128 lb 11.2 oz (58.378 kg)  04/12/15 128 lb 4.8 oz (58.196 kg)  03/02/15 122 lb 14.4 oz (55.747 kg)

## 2015-04-19 NOTE — Progress Notes (Signed)
  Radiation Oncology         [phone] ________________________________  Name: Martha Koch MRN: [id number]  Date: 04/19/2015  DOB: 02-25-1956  Weekly Radiation Therapy Management    ICD-9-CM ICD-10-CM   1. Recurrent cervical cancer (HCC) 180.9 C53.9      Current Dose: 18 Gy     Planned Dose:  50.4 Gy  Narrative . . . . . . . . The patient presents for routine under treatment assessment.                                   The patient is without complaint.  no nausea, c/o soreness left abdominal side where surgery was and numbness, appetite good, energy level good, regular bowel movements,                                  Set-up films were reviewed.                                 The chart was checked. Physical Findings. . .  weight is 128 lb 11.2 oz (58.378 kg). Her oral temperature is 97.5 F (36.4 C). Her blood pressure is 143/77 and her pulse is 82. Her respiration is 16. . The lungs are clear. The heart has a regular rhythm and rate. The abdomen is soft and nontender with normal bowel sounds. Mild hyperpigmentation changes noted in the treatment area. Impression . . . . . . . The patient is tolerating radiation. Plan . . . . . . . . . . . . Continue treatment as planned.  ________________________________   Blair Promise, PhD, MD

## 2015-04-20 ENCOUNTER — Ambulatory Visit
Admission: RE | Admit: 2015-04-20 | Discharge: 2015-04-20 | Disposition: A | Discharge status: Home / Self Care | Payer: Medicaid Other | Source: Ambulatory Visit | Attending: Radiation Oncology | Admitting: Radiation Oncology

## 2015-04-20 DIAGNOSIS — Z510 Encounter for antineoplastic radiation therapy: Secondary | ICD-10-CM | POA: Diagnosis not present

## 2015-04-21 ENCOUNTER — Ambulatory Visit
Admission: RE | Admit: 2015-04-21 | Discharge: 2015-04-21 | Disposition: A | Discharge status: Home / Self Care | Payer: Medicaid Other | Source: Ambulatory Visit | Attending: Radiation Oncology | Admitting: Radiation Oncology

## 2015-04-21 DIAGNOSIS — Z510 Encounter for antineoplastic radiation therapy: Secondary | ICD-10-CM | POA: Diagnosis not present

## 2015-04-22 ENCOUNTER — Ambulatory Visit
Admission: RE | Admit: 2015-04-22 | Discharge: 2015-04-22 | Disposition: A | Discharge status: Home / Self Care | Payer: Medicaid Other | Source: Ambulatory Visit | Attending: Radiation Oncology | Admitting: Radiation Oncology

## 2015-04-22 DIAGNOSIS — Z510 Encounter for antineoplastic radiation therapy: Secondary | ICD-10-CM | POA: Diagnosis not present

## 2015-04-25 ENCOUNTER — Ambulatory Visit
Admission: RE | Admit: 2015-04-25 | Discharge: 2015-04-25 | Disposition: A | Discharge status: Home / Self Care | Payer: Medicaid Other | Source: Ambulatory Visit | Attending: Radiation Oncology | Admitting: Radiation Oncology

## 2015-04-25 DIAGNOSIS — Z510 Encounter for antineoplastic radiation therapy: Secondary | ICD-10-CM | POA: Diagnosis not present

## 2015-04-26 ENCOUNTER — Encounter: Payer: Self-pay | Admitting: Radiation Oncology

## 2015-04-26 ENCOUNTER — Ambulatory Visit
Admission: RE | Admit: 2015-04-26 | Discharge: 2015-04-26 | Disposition: A | Discharge status: Home / Self Care | Payer: Medicaid Other | Source: Ambulatory Visit | Attending: Radiation Oncology | Admitting: Radiation Oncology

## 2015-04-26 ENCOUNTER — Encounter: Payer: Self-pay | Admitting: *Deleted

## 2015-04-26 VITALS — BP 126/88 | HR 75 | Temp 97.6°F | Ht 67.0 in | Wt 127.3 lb

## 2015-04-26 DIAGNOSIS — C539 Malignant neoplasm of cervix uteri, unspecified: Secondary | ICD-10-CM | POA: Diagnosis present

## 2015-04-26 DIAGNOSIS — Z510 Encounter for antineoplastic radiation therapy: Secondary | ICD-10-CM | POA: Diagnosis not present

## 2015-04-26 NOTE — Progress Notes (Signed)
  Radiation Oncology         [phone] ________________________________  Name: Martha Koch MRN: [id number]  Date: 04/26/2015  DOB: 1955-12-16  Weekly Radiation Therapy Management    ICD-9-CM ICD-10-CM   1. Recurrent cervical cancer (HCC) 180.9 C53.9      Current Dose: 27 Gy     Planned Dose:  50.4 Gy  Narrative . . . . . . . . The patient presents for routine under treatment assessment.                                   The patient is without complaint except for some soreness along the surgical scar area. She denies any nausea or diarrhea or appetite changes.                                 Set-up films were reviewed.                                 The chart was checked. Physical Findings. . .  height is 5\' 7"  (1.702 m) and weight is 127 lb 4.8 oz (57.743 kg). Her oral temperature is 97.6 F (36.4 C). Her blood pressure is 126/88 and her pulse is 75. . The lungs are clear. The heart has regular rhythm and rate. The abdomen is soft and nontender with normal bowel sounds. Hyperpigmentation changes noted in the treatment area. Impression . . . . . . . The patient is tolerating radiation. Plan . . . . . . . . . . . . Continue treatment as planned.  ________________________________   Blair Promise, PhD, MD

## 2015-04-26 NOTE — Progress Notes (Signed)
Martha Koch has completed 15 fractions to her abdomen.  She reports soreness in her abdomen from surgery especially when she tries to get up from a lying position.  She is taking ibuprofen twice a day.  She reports having a good appetite and denies having any nausea.  She denies having diarrhea and her last bowel movement was this morning.  She has hyperpigmentation on her abdomen.  She has been given radiaplex gel and was instructed to apply twice a day after treatment and at bedtime.  She reports fatigue.  BP 126/88 mmHg  Pulse 75  Temp(Src) 97.6 F (36.4 C) (Oral)  Ht 5\' 7"  (1.702 m)  Wt 127 lb 4.8 oz (57.743 kg)  BMI 19.93 kg/m2

## 2015-04-27 ENCOUNTER — Ambulatory Visit
Admission: RE | Admit: 2015-04-27 | Discharge: 2015-04-27 | Disposition: A | Discharge status: Home / Self Care | Payer: Medicaid Other | Source: Ambulatory Visit | Attending: Radiation Oncology | Admitting: Radiation Oncology

## 2015-04-27 ENCOUNTER — Ambulatory Visit (HOSPITAL_COMMUNITY)
Admission: RE | Admit: 2015-04-27 | Discharge: 2015-04-27 | Disposition: A | Discharge status: Home / Self Care | Payer: Medicaid Other | Source: Ambulatory Visit | Attending: Gynecologic Oncology | Admitting: Gynecologic Oncology

## 2015-04-27 ENCOUNTER — Encounter: Payer: Self-pay | Admitting: Radiation Oncology

## 2015-04-27 ENCOUNTER — Telehealth: Payer: Self-pay | Admitting: *Deleted

## 2015-04-27 DIAGNOSIS — I700 Atherosclerosis of aorta: Secondary | ICD-10-CM | POA: Diagnosis not present

## 2015-04-27 DIAGNOSIS — C539 Malignant neoplasm of cervix uteri, unspecified: Secondary | ICD-10-CM | POA: Insufficient documentation

## 2015-04-27 DIAGNOSIS — R918 Other nonspecific abnormal finding of lung field: Secondary | ICD-10-CM | POA: Diagnosis not present

## 2015-04-27 DIAGNOSIS — Z9889 Other specified postprocedural states: Secondary | ICD-10-CM | POA: Diagnosis not present

## 2015-04-27 DIAGNOSIS — J320 Chronic maxillary sinusitis: Secondary | ICD-10-CM | POA: Diagnosis not present

## 2015-04-27 DIAGNOSIS — Z510 Encounter for antineoplastic radiation therapy: Secondary | ICD-10-CM | POA: Diagnosis not present

## 2015-04-27 MED ORDER — RADIAPLEXRX EX GEL
Freq: Once | CUTANEOUS | Status: AC
  Administered 2015-04-26: 11:00:00 via TOPICAL

## 2015-04-27 MED ORDER — FLUDEOXYGLUCOSE F - 18 (FDG) INJECTION
6.3000 | Freq: Once | INTRAVENOUS | Status: DC | PRN
  Administered 2015-04-27: 6.3 via INTRAVENOUS
  Filled 2015-04-27: qty 6.3

## 2015-04-27 NOTE — Addendum Note (Signed)
Encounter addended by: Jacqulyn Liner, RN on: 04/27/2015  9:08 AM<BR>     Documentation filed: [employer], Orders

## 2015-04-27 NOTE — Addendum Note (Signed)
Encounter addended by: Jacqulyn Liner, RN on: 04/27/2015  9:12 AM<BR>     Documentation filed: Inpatient MAR

## 2015-04-27 NOTE — Progress Notes (Signed)
Paperwork faxed to [employer], [phone], and copy given to patient 04/27/15 Ardeen Fillers)

## 2015-04-27 NOTE — Telephone Encounter (Signed)
Per Joylene John, NP patient notified of PET scan results. Per Dr. Skeet Latch, no additional therapy including chemotherapy needed at this time. Patient scheduled to f/u with Dr. Skeet Latch 07/14/15 at 2:30pm. Patient agreeable to appt as scheduled and instructed to call our office with any questions or concerns prior to this appt.

## 2015-04-28 ENCOUNTER — Ambulatory Visit
Admission: RE | Admit: 2015-04-28 | Discharge: 2015-04-28 | Disposition: A | Discharge status: Home / Self Care | Payer: Medicaid Other | Source: Ambulatory Visit | Attending: Radiation Oncology | Admitting: Radiation Oncology

## 2015-04-28 DIAGNOSIS — Z510 Encounter for antineoplastic radiation therapy: Secondary | ICD-10-CM | POA: Diagnosis not present

## 2015-04-29 ENCOUNTER — Ambulatory Visit
Admission: RE | Admit: 2015-04-29 | Discharge: 2015-04-29 | Disposition: A | Discharge status: Home / Self Care | Payer: Medicaid Other | Source: Ambulatory Visit | Attending: Radiation Oncology | Admitting: Radiation Oncology

## 2015-04-29 DIAGNOSIS — Z510 Encounter for antineoplastic radiation therapy: Secondary | ICD-10-CM | POA: Diagnosis not present

## 2015-04-29 LAB — GLUCOSE, CAPILLARY: Glucose-Capillary: 96 mg/dL (ref 65–99)

## 2015-05-01 ENCOUNTER — Ambulatory Visit: Admission: RE | Admit: 2015-05-01 | Payer: Medicaid Other | Source: Ambulatory Visit

## 2015-05-02 ENCOUNTER — Ambulatory Visit: Payer: Medicaid Other

## 2015-05-02 ENCOUNTER — Ambulatory Visit
Admission: RE | Admit: 2015-05-02 | Discharge: 2015-05-02 | Disposition: A | Discharge status: Home / Self Care | Payer: Medicaid Other | Source: Ambulatory Visit | Attending: Radiation Oncology | Admitting: Radiation Oncology

## 2015-05-02 DIAGNOSIS — Z510 Encounter for antineoplastic radiation therapy: Secondary | ICD-10-CM | POA: Diagnosis not present

## 2015-05-03 ENCOUNTER — Encounter: Payer: Self-pay | Admitting: Radiation Oncology

## 2015-05-03 ENCOUNTER — Ambulatory Visit
Admission: RE | Admit: 2015-05-03 | Discharge: 2015-05-03 | Disposition: A | Discharge status: Home / Self Care | Payer: Self-pay | Source: Ambulatory Visit | Attending: Radiation Oncology | Admitting: Radiation Oncology

## 2015-05-03 ENCOUNTER — Ambulatory Visit
Admission: RE | Admit: 2015-05-03 | Discharge: 2015-05-03 | Disposition: A | Discharge status: Home / Self Care | Payer: Medicaid Other | Source: Ambulatory Visit | Attending: Radiation Oncology | Admitting: Radiation Oncology

## 2015-05-03 VITALS — BP 136/92 | HR 82 | Temp 97.6°F | Resp 16 | Wt 131.4 lb

## 2015-05-03 DIAGNOSIS — C539 Malignant neoplasm of cervix uteri, unspecified: Secondary | ICD-10-CM

## 2015-05-03 DIAGNOSIS — Z510 Encounter for antineoplastic radiation therapy: Secondary | ICD-10-CM | POA: Diagnosis not present

## 2015-05-03 NOTE — Progress Notes (Signed)
  Radiation Oncology         [phone] ________________________________  Name: Martha Koch MRN: [id number]  Date: 05/03/2015  DOB: 01/16/1956  Weekly Radiation Therapy Management    ICD-9-CM ICD-10-CM   1. Malignant neoplasm of cervix, unspecified site ([place]) 180.9 C53.9   2. Recurrent cervical cancer (HCC) 180.9 C53.9      Current Dose: 36 Gy     Planned Dose:  50.4 Gy  Narrative . . . . . . . . The patient presents for routine under treatment assessment.                                   The patient is without complaint. No nausea or appetite changes. She denies any diarrhea                                 Set-up films were reviewed.                                 The chart was checked. Physical Findings. . .  weight is 131 lb 6.4 oz (59.603 kg). Her oral temperature is 97.6 F (36.4 C). Her blood pressure is 136/92 and her pulse is 82. Her respiration is 16. . Weight essentially stable.  No significant changes. Hyperpigmentation changes noted in the surgical scar area. No skin breakdown,  the lungs are clear. The heart has regular rhythm and rate. The abdomen is soft and nontender with normal bowel sounds. Impression . . . . . . . The patient is tolerating radiation. Plan . . . . . . . . . . . . Continue treatment as planned.  ________________________________   Blair Promise, PhD, MD

## 2015-05-03 NOTE — Progress Notes (Signed)
Weekly rad txs abdomen 20/28 completed, soreness in abdomen,,  Skin darkening,  no nausea or gas,  Numbness still, no pain appetite good, energy level good, has radaiplex to start using, incision healed BP 136/92 mmHg  Pulse 82  Temp(Src) 97.6 F (36.4 C) (Oral)  Resp 16  Wt 131 lb 6.4 oz (59.603 kg)  Wt Readings from Last 3 Encounters:  05/03/15 131 lb 6.4 oz (59.603 kg)  04/26/15 127 lb 4.8 oz (57.743 kg)  04/19/15 128 lb 11.2 oz (58.378 kg)

## 2015-05-04 ENCOUNTER — Ambulatory Visit
Admission: RE | Admit: 2015-05-04 | Discharge: 2015-05-04 | Disposition: A | Discharge status: Home / Self Care | Payer: Medicaid Other | Source: Ambulatory Visit | Attending: Radiation Oncology | Admitting: Radiation Oncology

## 2015-05-04 ENCOUNTER — Encounter: Payer: Self-pay | Admitting: *Deleted

## 2015-05-04 DIAGNOSIS — Z510 Encounter for antineoplastic radiation therapy: Secondary | ICD-10-CM | POA: Diagnosis not present

## 2015-05-05 ENCOUNTER — Ambulatory Visit
Admission: RE | Admit: 2015-05-05 | Discharge: 2015-05-05 | Disposition: A | Discharge status: Home / Self Care | Payer: Medicaid Other | Source: Ambulatory Visit | Attending: Radiation Oncology | Admitting: Radiation Oncology

## 2015-05-05 DIAGNOSIS — Z510 Encounter for antineoplastic radiation therapy: Secondary | ICD-10-CM | POA: Diagnosis not present

## 2015-05-06 ENCOUNTER — Ambulatory Visit
Admission: RE | Admit: 2015-05-06 | Discharge: 2015-05-06 | Disposition: A | Discharge status: Home / Self Care | Payer: Medicaid Other | Source: Ambulatory Visit | Attending: Radiation Oncology | Admitting: Radiation Oncology

## 2015-05-06 DIAGNOSIS — Z510 Encounter for antineoplastic radiation therapy: Secondary | ICD-10-CM | POA: Diagnosis not present

## 2015-05-09 ENCOUNTER — Ambulatory Visit
Admission: RE | Admit: 2015-05-09 | Discharge: 2015-05-09 | Disposition: A | Discharge status: Home / Self Care | Payer: Medicaid Other | Source: Ambulatory Visit | Attending: Radiation Oncology | Admitting: Radiation Oncology

## 2015-05-09 DIAGNOSIS — Z510 Encounter for antineoplastic radiation therapy: Secondary | ICD-10-CM | POA: Diagnosis not present

## 2015-05-10 ENCOUNTER — Encounter: Payer: Self-pay | Admitting: Radiation Oncology

## 2015-05-10 ENCOUNTER — Ambulatory Visit
Admission: RE | Admit: 2015-05-10 | Discharge: 2015-05-10 | Disposition: A | Discharge status: Home / Self Care | Payer: Self-pay | Source: Ambulatory Visit | Attending: Radiation Oncology | Admitting: Radiation Oncology

## 2015-05-10 ENCOUNTER — Ambulatory Visit
Admission: RE | Admit: 2015-05-10 | Discharge: 2015-05-10 | Disposition: A | Discharge status: Home / Self Care | Payer: Medicaid Other | Source: Ambulatory Visit | Attending: Radiation Oncology | Admitting: Radiation Oncology

## 2015-05-10 VITALS — BP 125/81 | HR 88 | Temp 98.0°F | Ht 67.0 in | Wt 131.3 lb

## 2015-05-10 DIAGNOSIS — C539 Malignant neoplasm of cervix uteri, unspecified: Secondary | ICD-10-CM

## 2015-05-10 DIAGNOSIS — Z510 Encounter for antineoplastic radiation therapy: Secondary | ICD-10-CM | POA: Diagnosis not present

## 2015-05-10 NOTE — Progress Notes (Signed)
  Radiation Oncology         [phone] ________________________________  Name: NEETI KNUDTSON MRN: [id number]  Date: 05/10/2015  DOB: Nov 23, 1955  Weekly Radiation Therapy Management  Recurrent cervical cancer ([place])   Current Dose: 45 Gy     Planned Dose:  50.4 Gy  Narrative . . . . . . . . The patient presents for routine under treatment assessment.                                   The patient is without complaint.  She does complain of muscle pain over her radiation field, and numbness below her incision site which is intermittent. Her skin over her abdomen has some hyperpigmentation but no redness.                                 Set-up films were reviewed.                                 The chart was checked. Physical Findings. . .  height is 5\' 7"  (1.702 m) and weight is 131 lb 4.8 oz (59.557 kg). Her temperature is 98 F (36.7 C). Her blood pressure is 125/81 and her pulse is 88. . Weight essentially stable.  No significant changes. Lungs are clear to auscultation bilaterally. Heart has regular rate and rhythm. No palpable cervical, supraclavicular, or axillary adenopathy. Abdomen soft nontender with normal bowel sounds. Hyperpigmentation changes along the scar and treatment region. No skin breakdown.  Impression . . . . . . . The patient is tolerating radiation. Plan . . . . . . . . . . . . Continue treatment as planned.  ________________________________   Blair Promise, PhD, MD

## 2015-05-10 NOTE — Progress Notes (Signed)
Martha Koch is here for her 25th fraction of radiaton to her abdomen. She reports some mild fatigue, and needs to take rest breaks at times during heavier activities. She reports she is urinating and moving her bowels without difficulty. She does complain of muscle pain over her radiation field, and numbness below her incision site which is intermittent. Her skin over her abdomen has some hyperpigmentation but no redness. She alsohas concerns about dental work she needs completed. She reports she is eating well.   BP 125/81 mmHg  Pulse 88  Temp(Src) 98 F (36.7 C)  Ht 5\' 7"  (1.702 m)  Wt 131 lb 4.8 oz (59.557 kg)  BMI 20.56 kg/m2   Wt Readings from Last 3 Encounters:  05/10/15 131 lb 4.8 oz (59.557 kg)  05/03/15 131 lb 6.4 oz (59.603 kg)  04/26/15 127 lb 4.8 oz (57.743 kg)

## 2015-05-11 ENCOUNTER — Ambulatory Visit
Admission: RE | Admit: 2015-05-11 | Discharge: 2015-05-11 | Disposition: A | Discharge status: Home / Self Care | Payer: Medicaid Other | Source: Ambulatory Visit | Attending: Radiation Oncology | Admitting: Radiation Oncology

## 2015-05-11 DIAGNOSIS — Z510 Encounter for antineoplastic radiation therapy: Secondary | ICD-10-CM | POA: Diagnosis not present

## 2015-05-12 ENCOUNTER — Ambulatory Visit
Admission: RE | Admit: 2015-05-12 | Discharge: 2015-05-12 | Disposition: A | Discharge status: Home / Self Care | Payer: Medicaid Other | Source: Ambulatory Visit | Attending: Radiation Oncology | Admitting: Radiation Oncology

## 2015-05-12 DIAGNOSIS — Z510 Encounter for antineoplastic radiation therapy: Secondary | ICD-10-CM | POA: Diagnosis not present

## 2015-05-13 ENCOUNTER — Ambulatory Visit
Admission: RE | Admit: 2015-05-13 | Discharge: 2015-05-13 | Disposition: A | Discharge status: Home / Self Care | Payer: Medicaid Other | Source: Ambulatory Visit | Attending: Radiation Oncology | Admitting: Radiation Oncology

## 2015-05-13 ENCOUNTER — Encounter: Payer: Self-pay | Admitting: Radiation Oncology

## 2015-05-13 ENCOUNTER — Telehealth: Payer: Self-pay | Admitting: Gynecologic Oncology

## 2015-05-13 VITALS — BP 109/85 | HR 85 | Temp 98.2°F | Ht 67.0 in | Wt 131.4 lb

## 2015-05-13 DIAGNOSIS — C539 Malignant neoplasm of cervix uteri, unspecified: Secondary | ICD-10-CM

## 2015-05-13 DIAGNOSIS — Z510 Encounter for antineoplastic radiation therapy: Secondary | ICD-10-CM | POA: Diagnosis not present

## 2015-05-13 NOTE — Progress Notes (Signed)
Martha Koch presents for her last treatment of radiaton to her abdomen. Her skin is hyperpigmented at her radiation site. She reports muscle pain at her incision site a 4/10, and is taking ibuprofen for this pain. She denies any problems urinating or with her bowels. She has no other complaints at this time.   BP 109/85 mmHg  Pulse 85  Temp(Src) 98.2 F (36.8 C)  Ht 5\' 7"  (1.702 m)  Wt 131 lb 6.4 oz (59.603 kg)  BMI 20.58 kg/m2

## 2015-05-13 NOTE — Telephone Encounter (Signed)
Returned call to the office.  Patient stating she finished radiation today.  Reporting numbness intermittently at the incision on her abdomen.  Informed numbness can be normal after having surgery in an area and that it may improve over the next several months.  Stating she does not feel she can return to work because she cannot stand up straight because it feels her incision is "pulling".  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  Advised Dr. Skeet Latch would be notified to see if she would write out the patient until Jan per her request.  Advised our office would contact her back.

## 2015-05-13 NOTE — Progress Notes (Signed)
  Department of Radiation Oncology  Phone:  [phone] Fax:        [phone]  Weekly Treatment Note    Name: Martha Koch Date: 05/13/2015 MRN: [id number] DOB: Apr 02, 1956   Current dose: 50.4 Gy  Current fraction: 28   MEDICATIONS: Current Outpatient Prescriptions  Medication Sig Dispense Refill  . amLODipine (NORVASC) 10 MG tablet Take 1 tablet (10 mg total) by mouth daily. 90 tablet 3  . Ascorbic Acid (VITAMIN C) 1000 MG tablet Take 1,000 mg by mouth daily.    Marland Kitchen atorvastatin (LIPITOR) 20 MG tablet Take 1 tablet (20 mg total) by mouth daily. 90 tablet 3  . docusate sodium (COLACE) 100 MG capsule Take 100 mg by mouth.    Marland Kitchen ibuprofen (ADVIL,MOTRIN) 600 MG tablet Take 1 tablet (600 mg total) by mouth every 6 (six) hours as needed for moderate pain (pain). 20 tablet 1  . Multiple Vitamin (MULTIVITAMIN WITH MINERALS) TABS tablet Take 1 tablet by mouth daily.     No current facility-administered medications for this encounter.     ALLERGIES: Aspirin and Penicillins   LABORATORY DATA:  Lab Results  Component Value Date   WBC 3.1* 11/18/2014   HGB 12.8 11/18/2014   HCT 38.9 11/18/2014   MCV 82.4 11/18/2014   PLT 255 11/18/2014   Lab Results  Component Value Date   NA 143 11/18/2014   K 4.0 11/18/2014   CL 104 04/01/2014   CO2 25 11/18/2014   Lab Results  Component Value Date   ALT 12 11/18/2014   AST 16 11/18/2014   ALKPHOS 72 11/18/2014   BILITOT 0.26 11/18/2014     NARRATIVE: Martha Koch was seen today for weekly treatment management. The chart was checked and the patient's films were reviewed.   Ms. Norgren presents for her last treatment of radiaton to her abdomen. Her skin is hyperpigmented at her radiation site. She reports muscle pain at her incision site a 4/10, and is taking ibuprofen for this pain. She denies any problems urinating or with her bowels. She has no other complaints at this time.   BP 109/85 mmHg  Pulse 85  Temp(Src)  98.2 F (36.8 C)  Ht 5\' 7"  (1.702 m)  Wt 131 lb 6.4 oz (59.603 kg)  BMI 20.58 kg/m2  PHYSICAL EXAMINATION: height is 5\' 7"  (1.702 m) and weight is 131 lb 6.4 oz (59.603 kg). Her temperature is 98.2 F (36.8 C). Her blood pressure is 109/85 and her pulse is 85.        ASSESSMENT: The patient did satisfactorily with treatment.  PLAN: The patient will follow-up in our clinic in 1 month.

## 2015-05-16 ENCOUNTER — Telehealth: Payer: Self-pay | Admitting: Gynecologic Oncology

## 2015-05-16 ENCOUNTER — Encounter: Payer: Self-pay | Admitting: Gynecologic Oncology

## 2015-05-16 ENCOUNTER — Ambulatory Visit: Payer: Medicaid Other

## 2015-05-16 NOTE — Telephone Encounter (Signed)
Returned call to patient.  Per Dr. Skeet Latch, patient can be out until January 16 for recovery after treatment.  Letter will be written for the patient to pick up.  She will also be dropping off FMLA forms at that time.  Advised to call for any questions or concerns.

## 2015-05-18 NOTE — Progress Notes (Signed)
  Radiation Oncology         [phone] ________________________________  Name: Martha Koch MRN: [id number]  Date: 05/13/2015  DOB: [birth year]  End of Treatment Note   ICD-9-CM ICD-10-CM    1. 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   Mild soreness along the surgical bed in the anterior abdominal wall.  No nausea or diarrhea problems. Hyperpigmentation changes along the skin of the treatment area but no skin breakdown  Plan: The patient has completed radiation treatment. The patient will return to radiation oncology clinic for routine followup in one month. I advised them to call or return sooner if they have any questions or concerns related to their recovery or treatment.  -----------------------------------  Blair Promise, PhD, MD

## 2015-06-15 ENCOUNTER — Encounter: Payer: Self-pay | Admitting: Oncology

## 2015-06-16 ENCOUNTER — Encounter: Payer: Self-pay | Admitting: Radiation Oncology

## 2015-06-16 ENCOUNTER — Ambulatory Visit
Admission: RE | Admit: 2015-06-16 | Discharge: 2015-06-16 | Disposition: A | Discharge status: Home / Self Care | Payer: Medicaid Other | Source: Ambulatory Visit | Attending: Radiation Oncology | Admitting: Radiation Oncology

## 2015-06-16 VITALS — BP 150/83 | HR 82 | Temp 97.6°F | Ht 67.0 in | Wt 136.2 lb

## 2015-06-16 DIAGNOSIS — C539 Malignant neoplasm of cervix uteri, unspecified: Secondary | ICD-10-CM

## 2015-06-16 NOTE — Progress Notes (Signed)
  Radiation Oncology         [phone] ________________________________  Name: Martha Koch MRN: [id number]  Date: 06/16/2015  DOB: December 25, 1955  Follow-Up Visit Note  CC: Minerva Ends, MD  [name], MD  No diagnosis found.  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 04/06/2015-05/13/2015.  Site/dose:   Surgical bed along the anterior abdominal wall, 50.4 gray in 28 fractions  Narrative:  The patient returns today for routine follow-up. She reports some mild soreness/stiffness at her abdominal surgical site. It feels better after she has been moving, but feels worse when she gets up after sitting for a while. She denies any problems with nausea, diarrhea, or urinary problems. She has some hyperpigmentation at the treatment area, but she believes the color is improving. She has no other concerns at this time.  ALLERGIES:  is allergic to aspirin and penicillins.  Meds: Current Outpatient Prescriptions  Medication Sig Dispense Refill  . amLODipine (NORVASC) 10 MG tablet Take 1 tablet (10 mg total) by mouth daily. 90 tablet 3  . Ascorbic Acid (VITAMIN C) 1000 MG tablet Take 1,000 mg by mouth daily.    Marland Kitchen atorvastatin (LIPITOR) 20 MG tablet Take 1 tablet (20 mg total) by mouth daily. 90 tablet 3  . ibuprofen (ADVIL,MOTRIN) 600 MG tablet Take 1 tablet (600 mg total) by mouth every 6 (six) hours as needed for moderate pain (pain). 20 tablet 1  . Multiple Vitamin (MULTIVITAMIN WITH MINERALS) TABS tablet Take 1 tablet by mouth daily.    Marland Kitchen docusate sodium (COLACE) 100 MG capsule Take 100 mg by mouth. Reported on 06/16/2015     No current facility-administered medications for this encounter.    Physical Findings: The patient is in no acute distress. Patient is alert and oriented.  height is 5\' 7"  (1.702 m) and weight is 136 lb 3.2 oz  (61.78 kg). Her temperature is 97.6 F (36.4 C). Her blood pressure is 150/83 and her pulse is 82. .  No significant changes.  Lymph Nodes: No cervical or supraclavicular lymphadenopathy  Lungs: clear to auscultation bilaterally, no rales, wheezes, or rhonci  Heart: regular rate and rhythm  Abdomen: round, soft, non tender, positive bowel sounds   Surgical scar in left upper abdomen shows hyperpigmentation changes. No dominant mass.  Lab Findings: Lab Results  Component Value Date   WBC 3.1* 11/18/2014   HGB 12.8 11/18/2014   HCT 38.9 11/18/2014   MCV 82.4 11/18/2014   PLT 255 11/18/2014    Radiographic Findings: No results found.  Impression:  The patient is recovering from the effects of radiation.  Plan: She will follow up with Dr. Skeet Latch in January. She will follow up with me in April. She did have a chest CT scan at [facility] for follow-up of her small pulmonary nodules. In addition patient did undergo PET scan October 26 showing no areas of concern.  ____________________________________ -----------------------------------  Blair Promise, PhD, MD    This document serves as a record of services personally performed by Gery Pray, MD. It was created on his behalf by Lendon Collar, a trained medical scribe. The creation of this record is based on the scribe's personal observations and the provider's statements to them. This document has been checked and approved by the attending provider.

## 2015-06-16 NOTE — Progress Notes (Signed)
Ms. Weeks presents for follow up of radiation completed 05/13/15 to her her abdomen. She reports some mild soreness/stiffness at her abdominal surgical site. It feels better after she has been moving, but feels worse when she gets up after sitting for a while. She denies any problems with nasea, diarrhea, or urinary problems. She has some hyperpigmentation at the treatment area, but she believes the color is improving. She has no other concerns at this time.   BP 150/83 mmHg  Pulse 82  Temp(Src) 97.6 F (36.4 C)  Ht 5\' 7"  (1.702 m)  Wt 136 lb 3.2 oz (61.78 kg)  BMI 21.33 kg/m2   Wt Readings from Last 3 Encounters:  06/16/15 136 lb 3.2 oz (61.78 kg)  05/13/15 131 lb 6.4 oz (59.603 kg)  05/10/15 131 lb 4.8 oz (59.557 kg)

## 2015-07-07 ENCOUNTER — Ambulatory Visit: Payer: Self-pay | Admitting: Radiation Oncology

## 2015-07-08 ENCOUNTER — Other Ambulatory Visit: Payer: Self-pay | Admitting: Gynecologic Oncology

## 2015-07-08 DIAGNOSIS — C539 Malignant neoplasm of cervix uteri, unspecified: Secondary | ICD-10-CM

## 2015-07-08 NOTE — Progress Notes (Signed)
CT chest abdomen and pelvis per Dr. Skeet Latch.  Pt seen at [facility] on Jan 6

## 2015-07-14 ENCOUNTER — Encounter: Payer: Self-pay | Admitting: Gynecologic Oncology

## 2015-07-14 ENCOUNTER — Ambulatory Visit: Payer: Self-pay | Admitting: Gynecologic Oncology

## 2015-08-04 ENCOUNTER — Telehealth: Payer: Self-pay | Admitting: Family Medicine

## 2015-08-04 NOTE — Telephone Encounter (Signed)
Patient called requesting medication refill on bp medications, informed patient she might need an ov for refills. Please f/u

## 2015-08-08 NOTE — Telephone Encounter (Signed)
LVM to return call  Pt needs Office visit  Please schedule appointment

## 2015-08-09 NOTE — Telephone Encounter (Signed)
Patient scheduled an appt for 08/26/15 but is out of bp medication, patient would like to know if she can get enough until appt. Please f/u

## 2015-08-12 NOTE — Telephone Encounter (Signed)
Pt need OV

## 2015-08-26 ENCOUNTER — Ambulatory Visit: Payer: Medicaid Other | Attending: Family Medicine | Admitting: Family Medicine

## 2015-08-26 ENCOUNTER — Encounter: Payer: Self-pay | Admitting: Family Medicine

## 2015-08-26 ENCOUNTER — Encounter: Payer: Self-pay | Admitting: Clinical

## 2015-08-26 VITALS — BP 148/91 | HR 82 | Temp 98.0°F | Resp 16 | Ht 67.0 in | Wt 135.0 lb

## 2015-08-26 DIAGNOSIS — I10 Essential (primary) hypertension: Secondary | ICD-10-CM | POA: Insufficient documentation

## 2015-08-26 DIAGNOSIS — Z23 Encounter for immunization: Secondary | ICD-10-CM

## 2015-08-26 DIAGNOSIS — Z720 Tobacco use: Secondary | ICD-10-CM

## 2015-08-26 DIAGNOSIS — F17210 Nicotine dependence, cigarettes, uncomplicated: Secondary | ICD-10-CM | POA: Insufficient documentation

## 2015-08-26 DIAGNOSIS — Z0000 Encounter for general adult medical examination without abnormal findings: Secondary | ICD-10-CM | POA: Diagnosis not present

## 2015-08-26 DIAGNOSIS — Z8541 Personal history of malignant neoplasm of cervix uteri: Secondary | ICD-10-CM | POA: Insufficient documentation

## 2015-08-26 DIAGNOSIS — F17200 Nicotine dependence, unspecified, uncomplicated: Secondary | ICD-10-CM

## 2015-08-26 DIAGNOSIS — Z1159 Encounter for screening for other viral diseases: Secondary | ICD-10-CM

## 2015-08-26 LAB — HEPATITIS C ANTIBODY: HCV AB: NEGATIVE

## 2015-08-26 LAB — LIPID PANEL
CHOL/HDL RATIO: 2.9 ratio (ref <=5.0)
Cholesterol: 160 mg/dL (ref 125–200)
HDL: 56 mg/dL (ref >=46)
LDL Cholesterol: 92 mg/dL (ref <130)
TRIGLYCERIDES: 62 mg/dL (ref <150)
VLDL: 12 mg/dL (ref <30)

## 2015-08-26 LAB — POCT GLYCOSYLATED HEMOGLOBIN (HGB A1C): HEMOGLOBIN A1C: 5.8

## 2015-08-26 MED ORDER — ATORVASTATIN CALCIUM 20 MG PO TABS: 20.0000 mg | ORAL_TABLET | Freq: Every day | ORAL | Status: DC

## 2015-08-26 MED ORDER — AMLODIPINE BESYLATE 10 MG PO TABS: 10.0000 mg | ORAL_TABLET | Freq: Every day | ORAL | Status: DC

## 2015-08-26 MED FILL — ATORVASTATIN 20 MG TABLET: 20 | 30 days supply | Qty: 30 | Fill #0

## 2015-08-26 MED FILL — AMLODIPINE BESYLATE 10 MG T: 10 | 30 days supply | Qty: 30 | Fill #0

## 2015-08-26 NOTE — Assessment & Plan Note (Signed)
A; light smoker P: Smoking cessation counseling provided

## 2015-08-26 NOTE — Progress Notes (Signed)
Depression screen [facility] 2/9 08/26/2015 06/16/2015 06/29/2014 06/08/2014 03/30/2014  Decreased Interest 0 0 0 0 0  Down, Depressed, Hopeless 0 0 0 0 0  PHQ - 2 Score 0 0 0 0 0  Altered sleeping 2 - - - -  Tired, decreased energy 0 - - - -  Change in appetite 0 - - - -  Feeling bad or failure about yourself  0 - - - -  Trouble concentrating 0 - - - -  Moving slowly or fidgety/restless 0 - - - -  Suicidal thoughts 0 - - - -  PHQ-9 Score 2 - - - -    GAD 7 : Generalized Anxiety Score 08/26/2015  Nervous, Anxious, on Edge 0  Control/stop worrying 0  Worry too much - different things 0  Trouble relaxing 0  Restless 0  Easily annoyed or irritable 0  Afraid - awful might happen 0  Total GAD 7 Score 0

## 2015-08-26 NOTE — Assessment & Plan Note (Signed)
A; elevated BP off norvasc 10 mg P: Restart norvasc 10 mg  F/u in 4 weeks

## 2015-08-26 NOTE — Progress Notes (Signed)
F/U HTN No medication x 1 month No pain today  Tobacco user 6 cigarette per day  No suicidal thoughts in the past two weeks

## 2015-08-26 NOTE — Patient Instructions (Signed)
Martha Koch was seen today for hypertension.  Diagnoses and all orders for this visit:  Healthcare maintenance -     HgB A1c  Essential hypertension -     Lipid Panel -     amLODipine (NORVASC) 10 MG tablet; Take 1 tablet (10 mg total) by mouth daily. -     atorvastatin (LIPITOR) 20 MG tablet; Take 1 tablet (20 mg total) by mouth daily.  Need for hepatitis C screening test -     Hepatitis C antibody, reflex  Other orders -     Tdap vaccine greater than or equal to 7yo IM   Smoking cessation support: smoking cessation hotline: 1-800-QUIT-NOW.  Smoking cessation classes are available through [facility]. Call [phone] or visit our website at [email or web address]   F/u in 4 weeks for BP check with plan for f/u every 6 months after that if BP normal   Dr. Adrian Blackwater

## 2015-08-26 NOTE — Progress Notes (Signed)
Subjective:  Patient ID: Martha Koch, female    DOB: 08-10-1955  Age: 60 y.o. MRN: [id number]  CC: Hypertension and Nicotine Dependence   HPI Martha Koch presents for   1. CHRONIC HYPERTENSION  Disease Monitoring  Blood pressure range: not checking   Chest pain: no   Dyspnea: no   Claudication: no   Medication compliance: no, no meds for one month   Medication Side Effects  Lightheadedness: no   Urinary frequency: no   Edema: no      2. Smoking: 6 cigs per day. Down from 1 PPD. Desires to quit. Is s/p cervical cancer and treatment. Has tried nicotine gum.   Social History  Substance Use Topics  . Smoking status: Current Every Day Smoker -- 0.25 packs/day for 42 years    Types: Cigarettes  . Smokeless tobacco: Never Used     Comment: smokes 2-3 a day  . Alcohol Use: No    Outpatient Prescriptions Prior to Visit  Medication Sig Dispense Refill  . amLODipine (NORVASC) 10 MG tablet Take 1 tablet (10 mg total) by mouth daily. 90 tablet 3  . Ascorbic Acid (VITAMIN C) 1000 MG tablet Take 1,000 mg by mouth daily.    Marland Kitchen atorvastatin (LIPITOR) 20 MG tablet Take 1 tablet (20 mg total) by mouth daily. 90 tablet 3  . docusate sodium (COLACE) 100 MG capsule Take 100 mg by mouth. Reported on 06/16/2015    . ibuprofen (ADVIL,MOTRIN) 600 MG tablet Take 1 tablet (600 mg total) by mouth every 6 (six) hours as needed for moderate pain (pain). 20 tablet 1  . Multiple Vitamin (MULTIVITAMIN WITH MINERALS) TABS tablet Take 1 tablet by mouth daily.     No facility-administered medications prior to visit.    ROS Review of Systems  Constitutional: Negative for fever and chills.  Eyes: Negative for visual disturbance.  Respiratory: Negative for shortness of breath.   Cardiovascular: Negative for chest pain.  Gastrointestinal: Negative for abdominal pain and blood in stool.  Musculoskeletal: Negative for back pain and arthralgias.  Skin: Negative for rash.    Allergic/Immunologic: Negative for immunocompromised state.  Hematological: Negative for adenopathy. Does not bruise/bleed easily.  Psychiatric/Behavioral: Negative for suicidal ideas and dysphoric mood.    Objective:  BP 148/91 mmHg  Pulse 82  Temp(Src) 98 F (36.7 C) (Oral)  Resp 16  Ht 5\' 7"  (1.702 m)  Wt 135 lb (61.236 kg)  BMI 21.14 kg/m2  SpO2 99%  BP/Weight 08/26/2015 06/16/2015 [id number]  Systolic BP 123456 [id number]  Diastolic BP 91 83 85  Wt. (Lbs) 135 136.2 131.4  BMI 21.14 21.33 20.58    Physical Exam  Constitutional: She is oriented to person, place, and time. She appears well-developed and well-nourished. No distress.  HENT:  Head: Normocephalic and atraumatic.  Cardiovascular: Normal rate, regular rhythm, normal heart sounds and intact distal pulses.   Pulmonary/Chest: Effort normal and breath sounds normal.  Musculoskeletal: She exhibits no edema.  Neurological: She is alert and oriented to person, place, and time.  Skin: Skin is warm and dry. No rash noted.  Psychiatric: She has a normal mood and affect.    Lab Results  Component Value Date   HGBA1C 5.8 08/26/2015    Assessment & Plan:   Martha Koch was seen today for hypertension and nicotine dependence.  Diagnoses and all orders for this visit:  Healthcare maintenance -     HgB A1c  Essential hypertension -     Lipid Panel -  amLODipine (NORVASC) 10 MG tablet; Take 1 tablet (10 mg total) by mouth daily. -     atorvastatin (LIPITOR) 20 MG tablet; Take 1 tablet (20 mg total) by mouth daily.  Need for hepatitis C screening test -     Hepatitis C antibody, reflex  Other orders -     Tdap vaccine greater than or equal to 7yo IM    No orders of the defined types were placed in this encounter.    Follow-up: No Follow-up on file.   [name] MD

## 2015-09-07 ENCOUNTER — Telehealth: Payer: Self-pay | Admitting: *Deleted

## 2015-09-07 NOTE — Telephone Encounter (Signed)
-----   Message from [name], MD sent at 08/29/2015  1:17 PM EST ----- Hep C screen negative Cholesterol normal

## 2015-09-07 NOTE — Telephone Encounter (Signed)
LVM to return call.

## 2015-09-09 ENCOUNTER — Telehealth: Payer: Self-pay | Admitting: Family Medicine

## 2015-09-09 NOTE — Telephone Encounter (Signed)
Patient returning call to nurse...please follow up °

## 2015-09-19 NOTE — Telephone Encounter (Signed)
LVM to return call x2   Hep C screen negative Cholesterol normal

## 2015-09-19 NOTE — Telephone Encounter (Signed)
Pt returned call  Date of birth verified by pt  Lab results given Pt verbalized understanding

## 2015-09-23 ENCOUNTER — Other Ambulatory Visit (HOSPITAL_BASED_OUTPATIENT_CLINIC_OR_DEPARTMENT_OTHER): Payer: Medicaid Other

## 2015-09-23 DIAGNOSIS — C539 Malignant neoplasm of cervix uteri, unspecified: Secondary | ICD-10-CM | POA: Diagnosis present

## 2015-09-23 LAB — BASIC METABOLIC PANEL
ANION GAP: 8 meq/L (ref 3–11)
BUN: 9.7 mg/dL (ref 7.0–26.0)
CALCIUM: 9.9 mg/dL (ref 8.4–10.4)
CO2: 27 mEq/L (ref 22–29)
Chloride: 110 mEq/L — ABNORMAL HIGH (ref 98–109)
Creatinine: 0.9 mg/dL (ref 0.6–1.1)
EGFR: 84 mL/min/{1.73_m2} — AB (ref >90)
Glucose: 115 mg/dl (ref 70–140)
POTASSIUM: 4.7 meq/L (ref 3.5–5.1)
Sodium: 145 mEq/L (ref 136–145)

## 2015-09-26 ENCOUNTER — Encounter (HOSPITAL_COMMUNITY): Payer: Self-pay

## 2015-09-26 ENCOUNTER — Ambulatory Visit (HOSPITAL_COMMUNITY)
Admission: RE | Admit: 2015-09-26 | Discharge: 2015-09-26 | Disposition: A | Discharge status: Home / Self Care | Payer: Medicaid Other | Source: Ambulatory Visit | Attending: Gynecologic Oncology | Admitting: Gynecologic Oncology

## 2015-09-26 DIAGNOSIS — Z8541 Personal history of malignant neoplasm of cervix uteri: Secondary | ICD-10-CM | POA: Diagnosis present

## 2015-09-26 DIAGNOSIS — R918 Other nonspecific abnormal finding of lung field: Secondary | ICD-10-CM | POA: Diagnosis present

## 2015-09-26 DIAGNOSIS — C539 Malignant neoplasm of cervix uteri, unspecified: Secondary | ICD-10-CM

## 2015-09-26 DIAGNOSIS — Z90710 Acquired absence of both cervix and uterus: Secondary | ICD-10-CM | POA: Diagnosis not present

## 2015-09-26 DIAGNOSIS — Z08 Encounter for follow-up examination after completed treatment for malignant neoplasm: Secondary | ICD-10-CM | POA: Insufficient documentation

## 2015-09-26 DIAGNOSIS — I7090 Unspecified atherosclerosis: Secondary | ICD-10-CM | POA: Diagnosis not present

## 2015-09-26 MED ORDER — IOPAMIDOL (ISOVUE-300) INJECTION 61%
100.0000 mL | Freq: Once | INTRAVENOUS | Status: AC | PRN
  Administered 2015-09-26: 100 mL via INTRAVENOUS

## 2015-09-28 ENCOUNTER — Telehealth: Payer: Self-pay

## 2015-09-28 NOTE — Telephone Encounter (Signed)
Orders received from [place] to contact the patient to update with CT Abdomen and Pelvis W contrast ,Please let her know :  1) Hysterectomy, without recurrent or metastatic disease. 2. No evidence of pulmonary nodules. 3. Advanced atherosclerosis. Patient contacted and updated with results , patient states understanding , patient also aware of her follow up appointment tomorrow with Dr [name] .

## 2015-09-29 ENCOUNTER — Ambulatory Visit: Payer: Medicaid Other | Attending: Gynecologic Oncology | Admitting: Gynecologic Oncology

## 2015-09-29 ENCOUNTER — Ambulatory Visit (HOSPITAL_COMMUNITY): Payer: Medicaid Other

## 2015-09-29 ENCOUNTER — Other Ambulatory Visit (HOSPITAL_COMMUNITY)
Admission: RE | Admit: 2015-09-29 | Discharge: 2015-09-29 | Disposition: A | Discharge status: Home / Self Care | Payer: Medicaid Other | Source: Ambulatory Visit | Attending: Gynecologic Oncology | Admitting: Gynecologic Oncology

## 2015-09-29 ENCOUNTER — Encounter: Payer: Self-pay | Admitting: Gynecologic Oncology

## 2015-09-29 VITALS — BP 120/77 | HR 93 | Temp 98.0°F | Resp 18 | Ht 67.0 in | Wt 133.5 lb

## 2015-09-29 DIAGNOSIS — C539 Malignant neoplasm of cervix uteri, unspecified: Secondary | ICD-10-CM

## 2015-09-29 DIAGNOSIS — Z720 Tobacco use: Secondary | ICD-10-CM | POA: Insufficient documentation

## 2015-09-29 DIAGNOSIS — R1909 Other intra-abdominal and pelvic swelling, mass and lump: Secondary | ICD-10-CM | POA: Diagnosis present

## 2015-09-29 DIAGNOSIS — R918 Other nonspecific abnormal finding of lung field: Secondary | ICD-10-CM | POA: Insufficient documentation

## 2015-09-29 DIAGNOSIS — Z01411 Encounter for gynecological examination (general) (routine) with abnormal findings: Secondary | ICD-10-CM | POA: Insufficient documentation

## 2015-09-29 DIAGNOSIS — Z8541 Personal history of malignant neoplasm of cervix uteri: Secondary | ICD-10-CM

## 2015-09-29 DIAGNOSIS — E7800 Pure hypercholesterolemia, unspecified: Secondary | ICD-10-CM | POA: Diagnosis not present

## 2015-09-29 DIAGNOSIS — I10 Essential (primary) hypertension: Secondary | ICD-10-CM | POA: Insufficient documentation

## 2015-09-29 DIAGNOSIS — C799 Secondary malignant neoplasm of unspecified site: Secondary | ICD-10-CM | POA: Diagnosis present

## 2015-09-29 NOTE — Patient Instructions (Signed)
Follow up in 3 months

## 2015-09-29 NOTE — Addendum Note (Signed)
Addended by: Joylene John D on: 09/29/2015 04:04 PM   Modules accepted: Orders

## 2015-09-29 NOTE — Progress Notes (Signed)
GYNECOLOGIC ONCOLOGY OFFICE NOTE  Referring Provider: Dr. Purvis Kilts   Chief Complaint: Metastatic cervical cancer. S/P excision of port site recurrence  Assessment/Plan: Martha Koch is a 60 y.o. woman with stage IB1 cervical cancer with abdominal wall mass suspicious for port site recurrence and new indeterminate pulmonary lesions.  S/p excision of port site recurrence followed by abdominal wall radiation.  No evidence of disease CT of the chest abdomen and pelvis every 6 months, since this Martha Koch is at high risk for distant recurrent disease Pap test collected today will contact with the results   Follow-up at [facility] in 12/2015  History of Present Illness: Martha Koch is a 60 y.o. 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 Negative nodes. The primary tumor was a moderately to poorly differentiated tumor extending from the cervix into the lower uterine segment (6.9cm largest dimension in endometrium, 3.6cm largest dimension in the cervix). The bilateral parametrium was positive for involvement (however margins were negative). The anterior deep cervical stromal margin was close (59mm) but negative. There was LVSI present as well as perineural involvement.  Radiation treatment dates: December 2 through July 13, 2014  Site/dose: Pelvis 45 gray in 25 fractions; vaginal cuff/parametrial boost to 50.4 gray  The patient has begun noting a left upper quadrant mass in the past 1 month. She denies feeling increasing size in the lesion. She does not remember noting it before. It is not tender. She otherwise feels well and has no other symptoms concerning for recurrence.  PET scan report from 01/21/15: CHEST No hypermetabolic mediastinal or hilar nodes.  No suspicious pulmonary nodules on the CT scan. Mild emphysema noted. New ill-defined sub-cm pulmonary nodules are seen in both lungs, some which show mild metabolic activity. Differential diagnosis includes pulmonary metastases and atypical infectious or inflammatory etiologies.  \IMPRESSION: 3 cm hypermetabolic left anterior abdominal wall mass, suspicious for abdominal wall metastasis. New ill-defined sub-cm bilateral pulmonary nodules, some which show mild metabolic activity. Differential diagnosis includes early pulmonary metastases, and atypical infectious or inflammatory etiologies. Continued followup by chest CT recommended in 3-4 months.  Oncology Summary:   Squamous cell carcinoma of cervix (RAF-HCC)   04/01/2014 Initial Diagnosis Squamous cell carcinoma of cervix (RAF-HCC)   04/21/2014 Surgery Robotic-assisted radical hysterectomy with upper vaginectomy, bilateral salpingo-oophorectomy, bilateral pelvic lymphadenectomy   06/02/2014 - 07/13/2014 Radiation Site/dose: Pelvis 45 gray in 25 fractions; vaginal cuff/parametrial boost to 50.4 gray   02/23/2015 Surgery LUQ port site recurrence -> Abdominal wall mass excision    Path:  Final Diagnosis    A: Abdominal wall mass, excision - Skin and subcutaneous tissue with metastatic squamous cell carcinoma - Tumor size 6 cm - Surgical margins free   Has been seen by rad oncology and medical oncology at [facility]. Received XRT to the area.  CT C/A/P 06/21/2015 without change in the size of the pulmonary nodules. No evidence of metastatic disease.  CT of the chest abdomen and pelvis March 2017 without evidence of pulmonary nodules no evidence of metastatic disease  ROS: Reports weight gain, no vaginal bleeding, No cough no SOB, no changes in bowel or bladder habits, no adenopathy,  No hematuria no rectal bleeding no chest pain no palpable masses otherwise 10 point ROS negative  Past Medical  History:  Past Medical History  Past Medical History  Diagnosis Date  . Hypertension   . High cholesterol   . 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 Tobacco abuse       Past Surgical History:  Past Surgical History    Past Surgical History  Procedure Laterality Date  . Tubal ligation    . Pr lap, radical hyst w/ tube&ov, node bx Bilateral 04/21/2014    Procedure: ROBOTIC LAPAROSCOPY, SURGICAL, RADICAL HYST, BIL TOTAL PELVIC LYMPHECTOMY/PARA-AORTIC LYMPH ND SMPL, Selinda Michaels TUB/OVR; Surgeon: Devra Dopp, MD; Location: MAIN OR [facility]; Service: Gynecology Oncology  . Pr bil sal-ooph w/omentect,tah,rad dis,lymp Bilateral 02/23/2015    Procedure: Bilateral S&O W/Omentectomy, Tah/Radical Dissect For Debulk; W/Pelvic Lymphad/Lmtd Para-Aortic Lymphadenect; Surgeon: Devra Dopp, MD; Location: MAIN OR [facility]; Service: Gynecology Oncology      Family History:  Family History    Family History  Problem Relation Age of Onset  . Breast cancer    . Anesthesia problems Neg Hx   . Bleeding Disorder Neg Hx   . Clotting disorder Neg Hx       Physical Exam: Vitals:  BP 120/77 mmHg  Pulse 93  Temp(Src) 98 F (36.7 C) (Oral)  Resp 18  Ht 5\' 7"  (1.702 m)  Wt 133 lb 8 oz (60.555 kg)  BMI 20.90 kg/m2  SpO2 100%  WD female in NAD LN: No axillary cervical inguinal or supraclavicular adenopathy Abdomen: Soft, nontender, nondistended. . No hepatosplenomegaly. No ascites. Normal bowel sounds. No hernias. LUQ surgical incision with radiation hyperpigmentation changes, no masses at port sites. Chest: CTA Genitourinary: cuff intact, vagina foreshortened, no masses. Pap collected Rectal: Good tone no masses  Back No CVAT

## 2015-10-03 ENCOUNTER — Telehealth: Payer: Self-pay

## 2015-10-03 LAB — CYTOLOGY - PAP

## 2015-10-03 NOTE — Telephone Encounter (Signed)
Orders received from [facility] to contact the patient to update with "normal" PAP results collected on 09/30/2015 during visit with Dr [name]. Patient contacted and updated , patient states understanding , denies further questions or concerns at this time , will call with any changes.

## 2015-10-06 ENCOUNTER — Encounter: Payer: Self-pay | Admitting: Radiation Oncology

## 2015-10-06 ENCOUNTER — Ambulatory Visit
Admission: RE | Admit: 2015-10-06 | Discharge: 2015-10-06 | Disposition: A | Discharge status: Home / Self Care | Payer: Medicaid Other | Source: Ambulatory Visit | Attending: Radiation Oncology | Admitting: Radiation Oncology

## 2015-10-06 VITALS — BP 130/83 | HR 82 | Temp 98.3°F | Ht 67.0 in | Wt 135.5 lb

## 2015-10-06 DIAGNOSIS — C539 Malignant neoplasm of cervix uteri, unspecified: Secondary | ICD-10-CM

## 2015-10-06 NOTE — Progress Notes (Signed)
Radiation Oncology         [phone] ________________________________  Name: Martha Koch MRN: [id number]  Date: 10/06/2015  DOB: 21-Aug-1955  Follow-Up Visit Note  CC: Martha Ends, MD  Everitt Amber, MD  No diagnosis found.  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 Site/dose:   Surgical bed along the anterior abdominal wall, 50.4 gray in 28 fractions, previous post-op pelvic XRT  Narrative:  Martha Koch here for follow up. She denies having pain. She denies having nausea, diarrhea, bladder issues, or vaginal/rectal bleeding, no cramping, no hematuria.She reports her energy level is good and she is working full time.   ALLERGIES:  is allergic to aspirin and penicillins.  Meds: Current Outpatient Prescriptions  Medication Sig Dispense Refill  . amLODipine (NORVASC) 10 MG tablet Take 1 tablet (10 mg total) by mouth daily. 90 tablet 3  . Ascorbic Acid (VITAMIN C) 1000 MG tablet Take 1,000 mg by mouth daily. Reported on 08/26/2015    . atorvastatin (LIPITOR) 20 MG tablet Take 1 tablet (20 mg total) by mouth daily. 90 tablet 3  . cholecalciferol (VITAMIN D) 1000 units tablet Take 1,000 Units by mouth daily.     No current facility-administered medications for this encounter.    Physical Findings: The patient is in no acute distress. Patient is alert and oriented.  height is 5\' 7"  (1.702 m) and weight is 135 lb 8 oz (61.462 kg). Her oral temperature is 98.3 F (36.8 C). Her blood pressure is 130/83 and her pulse is 82. .  No significant changes.  Lymph Nodes: No cervical or supraclavicular lymphadenopathy  Lungs: clear to auscultation bilaterally, no rales, wheezes, or rhonci  Heart: regular rate and rhythm  Abdomen: round, soft, non tender, positive bowel sounds   -Surgical scar in left upper abdomen shows hyperpigmentation  changes. No palpable or visible signs of recurrence. -Pap smear and pelvic exam were not performed in light of Dr.Brewster's recent exams.  Lab Findings: Lab Results  Component Value Date   WBC 3.1* 11/18/2014   HGB 12.8 11/18/2014   HCT 38.9 11/18/2014   MCV 82.4 11/18/2014   PLT 255 11/18/2014    Radiographic Findings: Ct Chest W Contrast  09/26/2015  CLINICAL DATA:  Recurrent cervical cancer. Followup of pulmonary nodules. EXAM: CT CHEST, ABDOMEN, AND PELVIS WITH CONTRAST TECHNIQUE: Multidetector CT imaging of the chest, abdomen and pelvis was performed following the standard protocol during bolus administration of intravenous contrast. CONTRAST:  132mL ISOVUE-300 IOPAMIDOL (ISOVUE-300) INJECTION 61% COMPARISON:  04/27/2015 PET. Abdominal pelvic CT of 04/06/2014. No prior diagnostic chest CT. FINDINGS: CT CHEST FINDINGS Mediastinum/Lymph Nodes: No supraclavicular adenopathy. Advanced aortic atherosclerosis, especially within the descending segment. Normal heart size, without pericardial effusion. No central pulmonary embolism, on this non-dedicated study. No mediastinal or hilar adenopathy. Lungs/Pleura: No pleural fluid. Moderate centrilobular emphysema. No suspicious pulmonary nodule or mass. Musculoskeletal: No acute osseous abnormality. CT ABDOMEN PELVIS FINDINGS Hepatobiliary: Artifact degradation posterior right hepatic lobe. A too small to characterize lesion is suspected on image 53/series 2 in this region. Likely present on the 2015 exam and therefore benign. Normal gallbladder, without biliary ductal dilatation. Pancreas: Normal, without mass or ductal dilatation. Spleen: Normal in size, without focal abnormality. Adrenals/Urinary Tract: Normal right adrenal gland. Left adrenal thickening is mild. Normal kidneys, without hydronephrosis. Normal urinary bladder. Stomach/Bowel: Normal stomach, without wall thickening. Normal colon, appendix, and  terminal ileum. Normal small bowel.  Vascular/Lymphatic: Aortic and branch vessel atherosclerosis. No abdominopelvic adenopathy. Reproductive: Hysterectomy.  No adnexal mass. Other: No significant free fluid. No evidence of omental or peritoneal disease. Musculoskeletal: Surgical change about the left rectus musculature. No residual or recurrent disease identified. Mild disc bulge at L4-5. IMPRESSION: 1. Hysterectomy, without recurrent or metastatic disease. 2. No evidence of pulmonary nodules. 3. Advanced atherosclerosis. Electronically Signed   By: Abigail Miyamoto M.D.   On: 09/26/2015 10:57   Ct Abdomen Pelvis W Contrast  09/26/2015  CLINICAL DATA:  Recurrent cervical cancer. Followup of pulmonary nodules. EXAM: CT CHEST, ABDOMEN, AND PELVIS WITH CONTRAST TECHNIQUE: Multidetector CT imaging of the chest, abdomen and pelvis was performed following the standard protocol during bolus administration of intravenous contrast. CONTRAST:  111mL ISOVUE-300 IOPAMIDOL (ISOVUE-300) INJECTION 61% COMPARISON:  04/27/2015 PET. Abdominal pelvic CT of 04/06/2014. No prior diagnostic chest CT. FINDINGS: CT CHEST FINDINGS Mediastinum/Lymph Nodes: No supraclavicular adenopathy. Advanced aortic atherosclerosis, especially within the descending segment. Normal heart size, without pericardial effusion. No central pulmonary embolism, on this non-dedicated study. No mediastinal or hilar adenopathy. Lungs/Pleura: No pleural fluid. Moderate centrilobular emphysema. No suspicious pulmonary nodule or mass. Musculoskeletal: No acute osseous abnormality. CT ABDOMEN PELVIS FINDINGS Hepatobiliary: Artifact degradation posterior right hepatic lobe. A too small to characterize lesion is suspected on image 53/series 2 in this region. Likely present on the 2015 exam and therefore benign. Normal gallbladder, without biliary ductal dilatation. Pancreas: Normal, without mass or ductal dilatation. Spleen: Normal in size, without focal abnormality. Adrenals/Urinary Tract: Normal right  adrenal gland. Left adrenal thickening is mild. Normal kidneys, without hydronephrosis. Normal urinary bladder. Stomach/Bowel: Normal stomach, without wall thickening. Normal colon, appendix, and terminal ileum. Normal small bowel. Vascular/Lymphatic: Aortic and branch vessel atherosclerosis. No abdominopelvic adenopathy. Reproductive: Hysterectomy.  No adnexal mass. Other: No significant free fluid. No evidence of omental or peritoneal disease. Musculoskeletal: Surgical change about the left rectus musculature. No residual or recurrent disease identified. Mild disc bulge at L4-5. IMPRESSION: 1. Hysterectomy, without recurrent or metastatic disease. 2. No evidence of pulmonary nodules. 3. Advanced atherosclerosis. Electronically Signed   By: Abigail Miyamoto M.D.   On: 09/26/2015 10:57    Impression:  The patient is recovering from the effects of radiation. No evidence of disease recurrence on 09/26/15 CT or clinic exam today. Pap smear was normal with exam by Dr. Skeet Latch late last month.  Plan: Follow up in September ( 3 months after gyn/onc follow up).   -----------------------------------  Blair Promise, PhD, MD   This document serves as a record of services personally performed by Gery Pray, MD. It was created on his behalf by Derek Mound, a trained medical scribe. The creation of this record is based on the scribe's personal observations and the provider's statements to them. This document has been checked and approved by the attending provider.

## 2015-10-06 NOTE — Progress Notes (Signed)
Martha Koch here for follow up. She denies having pain.  She denies having nausea, diarrhea, bladder issues, or vaginal/rectal bleeding.  She reports her energy level is good and she is working full time.  BP 130/83 mmHg  Pulse 82  Temp(Src) 98.3 F (36.8 C) (Oral)  Ht 5\' 7"  (1.702 m)  Wt 135 lb 8 oz (61.462 kg)  BMI 21.22 kg/m2   Wt Readings from Last 3 Encounters:  10/06/15 135 lb 8 oz (61.462 kg)  09/29/15 133 lb 8 oz (60.555 kg)  08/26/15 135 lb (61.236 kg)

## 2015-10-07 MED FILL — AMLODIPINE BESYLATE 10 MG T: 10 | 30 days supply | Qty: 30 | Fill #0

## 2015-12-12 IMAGING — PT NM PET TUM IMG INITIAL (PI) SKULL BASE T - THIGH
1 of 8 series · 1 of 25 positions shown · non-contrast
Comparison: CT abdomen pelvis 04/06/2014.

CLINICAL DATA: Initial treatment strategy for its cervical cancer.
Status post radical hysterectomy on 04/21/2014. Staging.. Cervical
cancer [id number].K ([id number])

EXAM:
NUCLEAR MEDICINE PET SKULL BASE TO THIGH
TECHNIQUE: 6.3 mCi F-18 FDG was injected intravenously. Full-ring PET imaging
was performed from the skull base to thigh after the radiotracer. CT
data was obtained and used for attenuation correction and anatomic
localization.
FASTING BLOOD GLUCOSE:  Value: 111 mg/dl

[Series 4: ct sk_thigh 5.0 b31f · axial · 5.0mm · 0.88mm/px · 1 of 217 slices shown]
[im 217/217  brain]
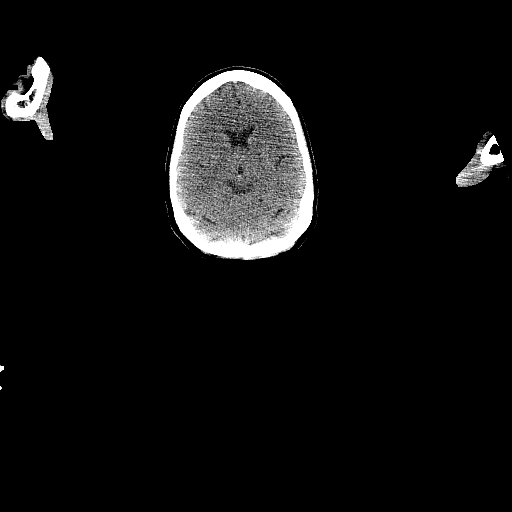

[1 of 25 positions shown; findings below may reference images not displayed]

FINDINGS: NECK

No areas of abnormal hypermetabolism.

CHEST

No areas of abnormal hypermetabolism.

ABDOMEN/PELVIS

No areas of abnormal hypermetabolism.

SKELETON

Anterior abdominal wall foci of hypermetabolism and subcutaneous
increased density including in the left side of the abdomen on image
122, midline image 127, and right-sided abdomen on image 129. These
likely represent port locations.

Low-level hypermetabolism about the serosa of pelvic bowel loops.
This corresponds to small volume pelvic fluid. No abdominal pelvic
nodal hypermetabolism.

CT IMAGES PERFORMED FOR ATTENUATION CORRECTION

No cervical adenopathy.

Centrilobular emphysema.

Small volume abdominal ascites. Mild left adrenal thickening. Age
advanced aortic atherosclerosis.

Contrast filled small bowel loops which measure up to 2.7 cm on
image 142. Upper normal. Air which is presumably within the vaginal
fornices on image 175 of series 4. There is more cephalad extension
of gas within the right hemipelvis on image 168 of series 4. No
well-defined fluid collection.
IMPRESSION: 1. Interval hysterectomy and bilateral oophorectomy. No solid
hypermetabolic foci or abdominal pelvic hypermetabolic nodes to
suggest metastatic disease.
2. Low-level hypermetabolism about pelvic bowel loops. Small volume
abdominal pelvic fluid. Right pelvic gas which may be within the
high vaginal fornix. However, extraluminal intraperitoneal air
cannot be excluded. Therefore, the low-level hypermetabolism could
simply be postoperative. However, peritoneal inflammation could have
a similar appearance. Especially if there is a concern of
postsurgical infection, consider dedicated contrast-enhanced
abdominal pelvic CT.
3. Borderline dilated small bowel loops, favor postoperative
adynamic ileus.

## 2015-12-13 MED FILL — AMLODIPINE BESYLATE 10 MG T: 10 | 30 days supply | Qty: 30 | Fill #1

## 2015-12-28 NOTE — Progress Notes (Signed)
GYNECOLOGIC ONCOLOGY OFFICE NOTE  Referring Provider: Dr. Purvis Kilts   Chief Complaint: Metastatic cervical cancer. S/P excision of port site recurrence  Assessment/Plan: Martha Koch is a 60 y.o. woman with stage IB1 cervical cancer initially treated in October 2015 who then presented with abdominal wall mass at the site of a robotic port and new indeterminate pulmonary lesions.  S/p excision of port site recurrence followed by abdominal wall radiation completed January 2016 No evidence of disease CT of the chest abdomen and pelvis every 6 months for 2 years, since this Martha Koch is at high risk for distant recurrent disease  Follow-up at [facility] in 03/2016 CT Chest Abd/Pelvis 03/2016 Follow-up with Dr. Purvis Kilts December 2017  History of Present Illness: Martha Koch is a 60 y.o. 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 Negative nodes. The primary tumor was a moderately to poorly differentiated tumor extending from the cervix into the lower uterine segment (6.9cm largest dimension in endometrium, 3.6cm largest dimension in the cervix). The bilateral parametrium was positive for involvement (however margins were negative). The anterior deep cervical stromal margin was close (67mm) but negative. There was LVSI present as well as perineural involvement.  Radiation treatment dates: December 2 through July 13, 2014  Site/dose: Pelvis 45 gray in 25 fractions; vaginal cuff/parametrial boost to 50.4 gray  The patient has begun noting a left upper quadrant mass in the past 1 month. She denies feeling increasing size in the lesion. She does not remember noting it before. It is not tender. She otherwise feels well and has no other symptoms concerning for  recurrence.  PET scan report from 01/21/15: CHEST No hypermetabolic mediastinal or hilar nodes. No suspicious pulmonary nodules on the CT scan. Mild emphysema noted. New ill-defined sub-cm pulmonary nodules are seen in both lungs, some which show mild metabolic activity. Differential diagnosis includes pulmonary metastases and atypical infectious or inflammatory etiologies.  \IMPRESSION: 3 cm hypermetabolic left anterior abdominal wall mass, suspicious for abdominal wall metastasis. New ill-defined sub-cm bilateral pulmonary nodules, some which show mild metabolic activity. Differential diagnosis includes early pulmonary metastases, and atypical infectious or inflammatory etiologies. Continued followup by chest CT recommended in 3-4 months.  Oncology Summary:   Squamous cell carcinoma of cervix (RAF-HCC)   04/01/2014 Initial Diagnosis Squamous cell carcinoma of cervix (RAF-HCC)   04/21/2014 Surgery Robotic-assisted radical hysterectomy with upper vaginectomy, bilateral salpingo-oophorectomy, bilateral pelvic lymphadenectomy   06/02/2014 - 07/13/2014 Radiation Site/dose: Pelvis 45 gray in 25 fractions; vaginal cuff/parametrial boost to 50.4 gray   02/23/2015 Surgery LUQ port site recurrence -> Abdominal wall mass excision    Path:  Final Diagnosis    A: Abdominal wall mass, excision - Skin and subcutaneous tissue with metastatic squamous cell carcinoma - Tumor size 6 cm - Surgical margins free   Has been seen by rad oncology and medical oncology at [facility]. Received XRT to the area.  CT C/A/P 06/21/2015 without change in the size of the pulmonary nodules. No evidence of metastatic disease.  CT of the chest abdomen and pelvis March 2017 without evidence of pulmonary nodules no evidence of metastatic disease  Pap 09/29/2015 wnl  ROS: Reports weight gain, no vaginal bleeding, No cough no SOB, no changes in bowel or bladder habits, no adenopathy,   No hematuria no rectal bleeding  no chest pain no palpable masses otherwise 10 point ROS negative, continues to smoke approximately 5-7 cigarettes per day.  Past Medical History:  Past Medical History    Past Medical History  Diagnosis Date  . Hypertension   . High cholesterol   . 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 Tobacco abuse       Past Surgical History:  Past Surgical History    Past Surgical History  Procedure Laterality Date  . Tubal ligation    . Pr lap, radical hyst w/ tube&ov, node bx Bilateral 04/21/2014    Procedure: ROBOTIC LAPAROSCOPY, SURGICAL, RADICAL HYST, BIL TOTAL PELVIC LYMPHECTOMY/PARA-AORTIC LYMPH ND SMPL, Selinda Michaels TUB/OVR; Surgeon: Devra Dopp, MD; Location: MAIN OR [facility]; Service: Gynecology Oncology  . Pr bil sal-ooph w/omentect,tah,rad dis,lymp Bilateral 02/23/2015    Procedure: Bilateral S&O W/Omentectomy, Tah/Radical Dissect For Debulk; W/Pelvic Lymphad/Lmtd Para-Aortic Lymphadenect; Surgeon: Devra Dopp, MD; Location: MAIN OR [facility]; Service: Gynecology Oncology      Family History:  Family History    Family History  Problem Relation Age of Onset  . Breast cancer    . Anesthesia problems Neg Hx   . Bleeding Disorder Neg Hx   . Clotting disorder Neg Hx       Physical Exam: Vitals:  BP 137/81 mmHg  Pulse 93  Temp(Src) 98.6 F (37 C) (Oral)  Resp 18  Ht 5\' 7"  (1.702 m)  Wt 129 lb 11.2 oz (58.832 kg)  BMI 20.31 kg/m2  SpO2 100%  WD female in NAD LN: No axillary cervical inguinal or supraclavicular adenopathy Abdomen: Soft, nontender, nondistended. . No hepatosplenomegaly. No ascites. Normal bowel sounds. No hernias. LUQ surgical incision with radiation hyperpigmentation changes, no masses at port sites. Chest: CTA Genitourinary: cuff intact, vagina foreshortened, no masses.No parametrial nodularity Rectal: Good tone no masses  Back No CVAT

## 2015-12-29 ENCOUNTER — Ambulatory Visit: Payer: Medicaid Other | Attending: Gynecologic Oncology | Admitting: Gynecologic Oncology

## 2015-12-29 ENCOUNTER — Encounter: Payer: Self-pay | Admitting: Gynecologic Oncology

## 2015-12-29 VITALS — BP 137/81 | HR 93 | Temp 98.6°F | Resp 18 | Ht 67.0 in | Wt 129.7 lb

## 2015-12-29 DIAGNOSIS — C538 Malignant neoplasm of overlapping sites of cervix uteri: Secondary | ICD-10-CM | POA: Diagnosis present

## 2015-12-29 DIAGNOSIS — I10 Essential (primary) hypertension: Secondary | ICD-10-CM | POA: Insufficient documentation

## 2015-12-29 DIAGNOSIS — Z803 Family history of malignant neoplasm of breast: Secondary | ICD-10-CM | POA: Diagnosis not present

## 2015-12-29 DIAGNOSIS — Z923 Personal history of irradiation: Secondary | ICD-10-CM | POA: Insufficient documentation

## 2015-12-29 DIAGNOSIS — Z90722 Acquired absence of ovaries, bilateral: Secondary | ICD-10-CM | POA: Insufficient documentation

## 2015-12-29 DIAGNOSIS — Z90710 Acquired absence of both cervix and uterus: Secondary | ICD-10-CM | POA: Diagnosis not present

## 2015-12-29 DIAGNOSIS — E7800 Pure hypercholesterolemia, unspecified: Secondary | ICD-10-CM | POA: Insufficient documentation

## 2015-12-29 DIAGNOSIS — Z720 Tobacco use: Secondary | ICD-10-CM

## 2015-12-29 DIAGNOSIS — R918 Other nonspecific abnormal finding of lung field: Secondary | ICD-10-CM | POA: Insufficient documentation

## 2015-12-29 DIAGNOSIS — Z8541 Personal history of malignant neoplasm of cervix uteri: Secondary | ICD-10-CM

## 2015-12-29 DIAGNOSIS — Z9889 Other specified postprocedural states: Secondary | ICD-10-CM | POA: Insufficient documentation

## 2015-12-29 NOTE — Patient Instructions (Signed)
Plan to have a CT scan of the chest, abdomen, and pelvis on September 21 with 10:15am arrival at [facility].  Follow up with Dr. Skeet Latch on Sept 28.

## 2016-01-06 ENCOUNTER — Telehealth: Payer: Self-pay | Admitting: *Deleted

## 2016-01-06 NOTE — Telephone Encounter (Signed)
Left message on patient's voicemail with appointment details. Pt is scheduled for a CT scan on 02/01/16 @ 10:30.

## 2016-02-01 ENCOUNTER — Ambulatory Visit (HOSPITAL_COMMUNITY)
Admission: RE | Admit: 2016-02-01 | Discharge: 2016-02-01 | Disposition: A | Discharge status: Home / Self Care | Payer: Medicaid Other | Source: Ambulatory Visit | Attending: Gynecologic Oncology | Admitting: Gynecologic Oncology

## 2016-02-01 ENCOUNTER — Telehealth: Payer: Self-pay | Admitting: Gynecologic Oncology

## 2016-02-01 ENCOUNTER — Encounter (HOSPITAL_COMMUNITY): Payer: Self-pay

## 2016-02-01 DIAGNOSIS — Z90710 Acquired absence of both cervix and uterus: Secondary | ICD-10-CM | POA: Diagnosis not present

## 2016-02-01 DIAGNOSIS — J439 Emphysema, unspecified: Secondary | ICD-10-CM | POA: Insufficient documentation

## 2016-02-01 DIAGNOSIS — R918 Other nonspecific abnormal finding of lung field: Secondary | ICD-10-CM | POA: Diagnosis not present

## 2016-02-01 DIAGNOSIS — C538 Malignant neoplasm of overlapping sites of cervix uteri: Secondary | ICD-10-CM | POA: Insufficient documentation

## 2016-02-01 DIAGNOSIS — I700 Atherosclerosis of aorta: Secondary | ICD-10-CM | POA: Insufficient documentation

## 2016-02-01 LAB — POCT I-STAT CREATININE: Creatinine, Ser: 0.9 mg/dL (ref 0.44–1.00)

## 2016-02-01 MED ORDER — IOPAMIDOL (ISOVUE-300) INJECTION 61%
100.0000 mL | Freq: Once | INTRAVENOUS | Status: AC | PRN
  Administered 2016-02-01: 100 mL via INTRAVENOUS

## 2016-02-01 NOTE — Telephone Encounter (Signed)
Informed patient of CT scan results.  Discussed the new tiny pulm nodule seen along with the findings of moderate emphysema.  Patient continues to smoke intermittently.  Advised that Dr. Skeet Latch would review the results and we would call her for any further recommendations.  CT results also to be forwarded to her PCP.  Advised to call for any needs or concerns.

## 2016-02-01 NOTE — Telephone Encounter (Signed)
Left message for patient to please call the office to discuss CT scan results.

## 2016-02-03 ENCOUNTER — Telehealth: Payer: Self-pay | Admitting: Gynecologic Oncology

## 2016-02-03 NOTE — Telephone Encounter (Signed)
Returned call to patient.  Left message with Dr. Leone Brand recommendations for probable repeat CT chest in three months to evaluate pulmonary nodules and to follow up with PCP on this issue.  Informed that if at that time, the nodules had grown, referral to pulmonologist may be warranted or even sooner per her PCP if her PCP feels she needs to be seen due to moderate emphysema seen on scan.  Advised to call for any questions or concerns.

## 2016-02-06 ENCOUNTER — Other Ambulatory Visit: Payer: Self-pay | Admitting: Family Medicine

## 2016-02-06 DIAGNOSIS — Z1231 Encounter for screening mammogram for malignant neoplasm of breast: Secondary | ICD-10-CM

## 2016-02-10 MED FILL — AMLODIPINE BESYLATE 10 MG T: 10 | 30 days supply | Qty: 30 | Fill #2

## 2016-02-16 ENCOUNTER — Ambulatory Visit: Payer: Medicaid Other | Attending: Family Medicine | Admitting: Family Medicine

## 2016-02-16 ENCOUNTER — Encounter: Payer: Self-pay | Admitting: Family Medicine

## 2016-02-16 VITALS — BP 138/87 | HR 94 | Temp 97.6°F | Ht 67.0 in | Wt 130.4 lb

## 2016-02-16 DIAGNOSIS — R938 Abnormal findings on diagnostic imaging of other specified body structures: Secondary | ICD-10-CM | POA: Diagnosis not present

## 2016-02-16 DIAGNOSIS — Z23 Encounter for immunization: Secondary | ICD-10-CM

## 2016-02-16 DIAGNOSIS — R918 Other nonspecific abnormal finding of lung field: Secondary | ICD-10-CM | POA: Diagnosis not present

## 2016-02-16 DIAGNOSIS — Z79899 Other long term (current) drug therapy: Secondary | ICD-10-CM | POA: Insufficient documentation

## 2016-02-16 DIAGNOSIS — F17210 Nicotine dependence, cigarettes, uncomplicated: Secondary | ICD-10-CM | POA: Diagnosis present

## 2016-02-16 DIAGNOSIS — Z720 Tobacco use: Secondary | ICD-10-CM

## 2016-02-16 DIAGNOSIS — R9389 Abnormal findings on diagnostic imaging of other specified body structures: Secondary | ICD-10-CM

## 2016-02-16 MED ORDER — ZOSTER VACCINE LIVE 19400 UNT/0.65ML ~~LOC~~ SUSR: 0.6500 mL | Freq: Once | SUBCUTANEOUS | 0 refills | Status: AC

## 2016-02-16 MED ORDER — VARENICLINE TARTRATE 1 MG PO TABS: 1.0000 mg | ORAL_TABLET | Freq: Two times a day (BID) | ORAL | 1 refills | Status: DC

## 2016-02-16 MED ORDER — VARENICLINE TARTRATE 0.5 MG X 11 & 1 MG X 42 PO MISC: ORAL | 0 refills | Status: DC

## 2016-02-16 MED FILL — CHANTIX STARTING MONTH BOX: 0.5 MG X 11 | 30 days supply | Qty: 53 | Fill #0

## 2016-02-16 NOTE — Patient Instructions (Addendum)
Martha Koch was seen today for emphysema and lung lesion.  Diagnoses and all orders for this visit:  Continuous tobacco abuse -     varenicline (CHANTIX STARTING MONTH PAK) 0.5 MG X 11 & 1 MG X 42 tablet; Taper per packet insert -     varenicline (CHANTIX CONTINUING MONTH PAK) 1 MG tablet; Take 1 tablet (1 mg total) by mouth 2 (two) times daily.  Abnormal CT of the chest -     Pulmonary Function Test; Future  Pulmonary nodules  Need for prophylactic vaccination against Streptococcus pneumoniae (pneumococcus) -     Pneumococcal polysaccharide vaccine 23-valent greater than or equal to 2yo subcutaneous/IM   Pneumovax given today,  Another pneumovax will be give after age 56  I have ordered lung function test to establish baseline lung function and assess degree of emphysema.   Return in 3 months for repeat CT chest to follow up lung nodules  Dr. Adrian Blackwater

## 2016-02-16 NOTE — Progress Notes (Signed)
Subjective:  Patient ID: Martha Koch, female    DOB: 1955/09/29  Age: 60 y.o. MRN: [id number]  CC: Emphysema and Lung Lesion   HPI Martha Koch has HTN, hx of recurrent cervical cancer  presents for    1. F/u CT chest: she had a CT chest and abdomen for routine post cervical cancer follow up on 02/01/2016. She was noted to have emphysematous changes, atherosclerosis and 2 pulmonary nodules, 3 mm LUL, and 2 mm LLL. The 3 mm nodule is new.  She is a long-erm smoker. Smoking 7 cigs per day now. Started smoking at age 75.  At max smoked 1 PPD for about 20 years. She has tried nicotine gum in the past to help her quit without success. She desires to quit. She denies cough, CP, SOB, fatigue, weight loss, somnolence or decreased exercise tolerance. She lives alone and has not been exposed to second hand smoke.   She gets her flu shot yearly work. She cannot recall her last pneumonia shot.   Social History  Substance Use Topics  . Smoking status: Current Every Day Smoker    Packs/day: 0.25    Years: 42.00    Types: Cigarettes  . Smokeless tobacco: Never Used     Comment: smokes 2-3 a day  . Alcohol use No    Outpatient Medications Prior to Visit  Medication Sig Dispense Refill  . amLODipine (NORVASC) 10 MG tablet Take 1 tablet (10 mg total) by mouth daily. 90 tablet 3  . Ascorbic Acid (VITAMIN C) 1000 MG tablet Take 1,000 mg by mouth daily. Reported on 08/26/2015    . atorvastatin (LIPITOR) 20 MG tablet Take 1 tablet (20 mg total) by mouth daily. 90 tablet 3  . cholecalciferol (VITAMIN D) 1000 units tablet Take 1,000 Units by mouth daily.     No facility-administered medications prior to visit.     ROS Review of Systems  Constitutional: Negative for chills and fever.  Eyes: Negative for visual disturbance.  Respiratory: Negative for shortness of breath.   Cardiovascular: Negative for chest pain.  Gastrointestinal: Negative for abdominal pain and blood in stool.    Musculoskeletal: Negative for arthralgias and back pain.  Skin: Negative for rash.  Allergic/Immunologic: Negative for immunocompromised state.  Hematological: Negative for adenopathy. Does not bruise/bleed easily.  Psychiatric/Behavioral: Negative for dysphoric mood and suicidal ideas.    Objective:  BP 138/87 (BP Location: Left Arm, Patient Position: Sitting, Cuff Size: Large)   Pulse 94   Temp 97.6 F (36.4 C) (Oral)   Ht 5\' 7"  (1.702 m)   Wt 130 lb 6.4 oz (59.1 kg)   SpO2 100%   BMI 20.42 kg/m   BP/Weight 02/16/2016 [id number]  Systolic BP 0000000 0000000 [id number]  Diastolic BP 87 81 83  Wt. (Lbs) 130.4 129.7 135.5  BMI 20.42 20.31 21.22    Physical Exam  Constitutional: She is oriented to person, place, and time. She appears well-developed and well-nourished. No distress.  HENT:  Head: Normocephalic and atraumatic.  Cardiovascular: Normal rate, regular rhythm, normal heart sounds and intact distal pulses.   Pulmonary/Chest: Effort normal and breath sounds normal.  Musculoskeletal: She exhibits no edema.  Neurological: She is alert and oriented to person, place, and time.  Skin: Skin is warm and dry. No rash noted.  Psychiatric: She has a normal mood and affect.   Assessment & Plan:  Martha Koch was seen today for emphysema and lung lesion.  Diagnoses and all orders for this visit:  Continuous tobacco abuse -     varenicline (CHANTIX STARTING MONTH PAK) 0.5 MG X 11 & 1 MG X 42 tablet; Taper per packet insert -     varenicline (CHANTIX CONTINUING MONTH PAK) 1 MG tablet; Take 1 tablet (1 mg total) by mouth 2 (two) times daily.  Abnormal CT of the chest -     Pulmonary Function Test; Future  Pulmonary nodules  Need for prophylactic vaccination against Streptococcus pneumoniae (pneumococcus) -     Pneumococcal polysaccharide vaccine 23-valent greater than or equal to 2yo subcutaneous/IM   There are no diagnoses linked to this encounter.  No orders of the defined types  were placed in this encounter.   Follow-up: Return in about 3 months (around 05/18/2016) for f/u CT chest .   [name] MD

## 2016-02-16 NOTE — Progress Notes (Signed)
C/C: follow up for CT scan

## 2016-02-22 ENCOUNTER — Ambulatory Visit
Admission: RE | Admit: 2016-02-22 | Discharge: 2016-02-22 | Disposition: A | Discharge status: Home / Self Care | Payer: Medicaid Other | Source: Ambulatory Visit | Attending: Family Medicine | Admitting: Family Medicine

## 2016-02-22 DIAGNOSIS — Z1231 Encounter for screening mammogram for malignant neoplasm of breast: Secondary | ICD-10-CM

## 2016-02-29 ENCOUNTER — Ambulatory Visit (HOSPITAL_COMMUNITY)
Admission: RE | Admit: 2016-02-29 | Discharge: 2016-02-29 | Disposition: A | Discharge status: Home / Self Care | Payer: Medicaid Other | Source: Ambulatory Visit | Attending: Family Medicine | Admitting: Family Medicine

## 2016-02-29 DIAGNOSIS — R938 Abnormal findings on diagnostic imaging of other specified body structures: Secondary | ICD-10-CM | POA: Insufficient documentation

## 2016-02-29 DIAGNOSIS — R9389 Abnormal findings on diagnostic imaging of other specified body structures: Secondary | ICD-10-CM

## 2016-02-29 LAB — PULMONARY FUNCTION TEST
DL/VA % pred: 51 %
DL/VA: 2.64 ml/min/mmHg/L
DLCO UNC % PRED: 36 %
DLCO UNC: 10.45 ml/min/mmHg
FEF 25-75 PRE: 1.05 L/s
FEF 25-75 Post: 0.82 L/sec
FEF2575-%Change-Post: -21 %
FEF2575-%PRED-POST: 36 %
FEF2575-%Pred-Pre: 46 %
FEV1-%CHANGE-POST: -2 %
FEV1-%PRED-POST: 74 %
FEV1-%PRED-PRE: 76 %
FEV1-POST: 1.75 L
FEV1-Pre: 1.79 L
FEV1FVC-%Change-Post: 5 %
FEV1FVC-%Pred-Pre: 83 %
FEV6-%CHANGE-POST: -7 %
FEV6-%PRED-POST: 86 %
FEV6-%PRED-PRE: 92 %
FEV6-POST: 2.48 L
FEV6-PRE: 2.68 L
FEV6FVC-%CHANGE-POST: 0 %
FEV6FVC-%PRED-POST: 102 %
FEV6FVC-%PRED-PRE: 102 %
FVC-%Change-Post: -7 %
FVC-%PRED-POST: 83 %
FVC-%Pred-Pre: 90 %
FVC-Post: 2.49 L
FVC-Pre: 2.69 L
POST FEV6/FVC RATIO: 100 %
PRE FEV6/FVC RATIO: 99 %
Post FEV1/FVC ratio: 70 %
Pre FEV1/FVC ratio: 66 %
RV % PRED: 80 %
RV: 1.74 L
TLC % PRED: 81 %
TLC: 4.48 L

## 2016-02-29 MED ORDER — ALBUTEROL SULFATE (2.5 MG/3ML) 0.083% IN NEBU
2.5000 mg | INHALATION_SOLUTION | Freq: Once | RESPIRATORY_TRACT | Status: AC
  Administered 2016-02-29: 2.5 mg via RESPIRATORY_TRACT

## 2016-03-08 ENCOUNTER — Ambulatory Visit
Admission: RE | Admit: 2016-03-08 | Discharge: 2016-03-08 | Disposition: A | Discharge status: Home / Self Care | Payer: Medicaid Other | Source: Ambulatory Visit | Attending: Radiation Oncology | Admitting: Radiation Oncology

## 2016-03-08 ENCOUNTER — Encounter: Payer: Self-pay | Admitting: Radiation Oncology

## 2016-03-08 ENCOUNTER — Telehealth: Payer: Self-pay | Admitting: Gynecologic Oncology

## 2016-03-08 ENCOUNTER — Other Ambulatory Visit: Payer: Medicaid Other

## 2016-03-08 VITALS — BP 135/84 | HR 80 | Temp 98.1°F | Ht 67.0 in | Wt 131.2 lb

## 2016-03-08 DIAGNOSIS — C539 Malignant neoplasm of cervix uteri, unspecified: Secondary | ICD-10-CM | POA: Diagnosis not present

## 2016-03-08 NOTE — Telephone Encounter (Signed)
Left message for patient about her appt with Dr. Skeet Latch being moved from the end of Sept to Nov 30 since there are no clinic dates in Dec for Dr. Skeet Latch.  Appt in Sept moved per Dr. Clabe Seal request.  Patient advised to call for any needs or concerns.

## 2016-03-08 NOTE — Progress Notes (Signed)
Radiation Oncology         [phone] ________________________________  Name: Martha Koch MRN: [id number]  Date: 03/08/2016  DOB: 05-Apr-1956  Follow-Up Visit Note  CC: Minerva Ends, MD  [name], MD    ICD-9-CM ICD-10-CM   1. Malignant neoplasm of cervix, unspecified site ([facility]) 180.9 C53.9   2. 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 The patient followed up with Dr. Skeet Latch on 12/28/15 who noted no massed on pelvic exam. Last Pap smear on 09/29/15 was negative for malignancy. CT of the chest/abd/pelvis on 02/01/16 noted no recurrence of disease, but showed 2 small pulmonary nodules with one in the LUL and another in the LLL.  She denies having any bladder/bowel issues, vaginal/rectal bleeding, abdominal pain, or nausea.  She reports her appetite is good.  She reports her energy level is good and she is working full time.  She is not using a vaginal dilator.  ALLERGIES:  is allergic to aspirin and penicillins.  Meds: Current Outpatient Prescriptions  Medication Sig Dispense Refill  . amLODipine (NORVASC) 10 MG tablet Take 1 tablet (10 mg total) by mouth daily. 90 tablet 3  . Ascorbic Acid (VITAMIN C) 1000 MG tablet Take 1,000 mg by mouth daily. Reported on 08/26/2015    . atorvastatin (LIPITOR) 20 MG tablet Take 1 tablet (20 mg total) by mouth daily. 90 tablet 3  . cholecalciferol (VITAMIN D) 1000 units tablet Take 1,000 Units by mouth daily.    . varenicline (CHANTIX CONTINUING MONTH PAK) 1 MG tablet Take 1 tablet (1 mg total)  by mouth 2 (two) times daily. (Patient not taking: Reported on 03/08/2016) 56 tablet 1  . varenicline (CHANTIX STARTING MONTH PAK) 0.5 MG X 11 & 1 MG X 42 tablet Taper per packet insert (Patient not taking: Reported on 03/08/2016) 53 tablet 0   No current facility-administered medications for this encounter.     Physical Findings: The patient is in no acute distress. Patient is alert and oriented.  height is 5\' 7"  (1.702 m) and weight is 131 lb 3.2 oz (59.5 kg). Her oral temperature is 98.1 F (36.7 C). Her blood pressure is 135/84 and her pulse is 80. Her oxygen saturation is 100%.   Lymph Nodes: No cervical or supraclavicular lymphadenopathy  Lungs: clear to auscultation bilaterally, no rales, wheezes, or rhonci  Heart: regular rate and rhythm  Abdomen: round, soft, non tender, positive bowel sounds   Hyperpigmentation changes along her surgical scar in the left upper abdomen. No palpable or visible signs of recurrence. No inguinal adenopathy. On pelvic examination the external genitalia were unremarkable. A speculum exam was performed. There are no mucosal lesions noted in the vaginal vault. Mild radiation changes at the vaginal cuff. On bimanual and rectovaginal examination there were no pelvic masses appreciated.  Lab Findings: Lab Results  Component Value Date   WBC 3.1 (L) 11/18/2014   HGB 12.8 11/18/2014   HCT 38.9 11/18/2014   MCV 82.4 11/18/2014   PLT 255 11/18/2014    Radiographic Findings: Mm Digital Screening Bilateral  Result Date: 02/22/2016 CLINICAL DATA:  Screening. EXAM: DIGITAL SCREENING BILATERAL MAMMOGRAM WITH CAD COMPARISON:  Previous exam(s). ACR Breast Density Category b: There are scattered areas of fibroglandular density. FINDINGS: There are no findings suspicious for malignancy. Images were processed with CAD. IMPRESSION: No mammographic evidence of malignancy. A result letter of this screening mammogram will be mailed directly to the patient. RECOMMENDATION:  Screening mammogram in one year. (Code:SM-B-01Y) BI-RADS CATEGORY  1: Negative. Electronically Signed   By: Margarette Canada M.D.   On: 02/22/2016 14:40    Impression:  . No evidence of disease recurrence on 02/01/16 CT or clinic exam today. New lung nodules on the recent CT will be followed. Pap smear on 09/29/15 was negative.  Plan: She is scheduled to follow up with Dr. Skeet Latch on 03/29/16. She is to follow up in radiation oncology in 6 months. She will have additional scans in 3 months.  -----------------------------------  Blair Promise, PhD, MD  This document serves as a record of services personally performed by Gery Pray, MD. It was created on his behalf by Darcus Austin, a trained medical scribe. The creation of this record is based on the scribe's personal observations and the provider's statements to them. This document has been checked and approved by the attending provider.

## 2016-03-08 NOTE — Progress Notes (Signed)
Martha Koch is here for follow up after treatment to her abdomen and pelvis.  She denies having any bladder/bowel issues, vaginal/rectal bleeding, abdominal pain or nausea.  She reports her appetite is good.  She reports her energy level is good and she is working full time.  She is not using a vaginal dilator.  BP 135/84 (BP Location: Right Arm, Patient Position: Sitting)   Pulse 80   Temp 98.1 F (36.7 C) (Oral)   Ht 5\' 7"  (1.702 m)   Wt 131 lb 3.2 oz (59.5 kg)   SpO2 100%   BMI 20.55 kg/m    Wt Readings from Last 3 Encounters:  03/08/16 131 lb 3.2 oz (59.5 kg)  02/16/16 130 lb 6.4 oz (59.1 kg)  12/29/15 129 lb 11.2 oz (58.8 kg)

## 2016-03-18 ENCOUNTER — Other Ambulatory Visit: Payer: Self-pay | Admitting: Family Medicine

## 2016-03-18 DIAGNOSIS — J988 Other specified respiratory disorders: Secondary | ICD-10-CM | POA: Insufficient documentation

## 2016-03-18 DIAGNOSIS — R942 Abnormal results of pulmonary function studies: Secondary | ICD-10-CM | POA: Insufficient documentation

## 2016-03-21 ENCOUNTER — Telehealth: Payer: Self-pay

## 2016-03-21 ENCOUNTER — Other Ambulatory Visit: Payer: Medicaid Other

## 2016-03-21 NOTE — Telephone Encounter (Signed)
Pt. Returned call. Please f/u °

## 2016-03-21 NOTE — Telephone Encounter (Signed)
VM was left for pt to return phone call about lab results.

## 2016-03-22 ENCOUNTER — Ambulatory Visit (HOSPITAL_COMMUNITY): Payer: Medicaid Other

## 2016-03-27 ENCOUNTER — Telehealth: Payer: Self-pay

## 2016-03-27 NOTE — Telephone Encounter (Signed)
-----   Message from [name], MD sent at 03/18/2016  8:46 PM EDT ----- Pulmonary function test reviewed results Minimal Obstructive Airways Disease- early COPD Insignificant response to bronchodilator Severe Diffusion Defect- likely emphysema  Plan: Pulmonology referral placed for additional lung function testing It is very important that [name] stop smoking

## 2016-03-27 NOTE — Telephone Encounter (Signed)
Left voicemail requested return call to [facility]

## 2016-03-27 NOTE — Progress Notes (Signed)
Letter sent.

## 2016-03-29 ENCOUNTER — Ambulatory Visit: Payer: Medicaid Other | Admitting: Gynecologic Oncology

## 2016-04-23 ENCOUNTER — Ambulatory Visit (INDEPENDENT_AMBULATORY_CARE_PROVIDER_SITE_OTHER): Payer: Medicaid Other | Admitting: Emergency Medicine

## 2016-04-23 ENCOUNTER — Encounter: Payer: Self-pay | Admitting: *Deleted

## 2016-04-23 DIAGNOSIS — J449 Chronic obstructive pulmonary disease, unspecified: Secondary | ICD-10-CM | POA: Insufficient documentation

## 2016-04-23 DIAGNOSIS — J441 Chronic obstructive pulmonary disease with (acute) exacerbation: Secondary | ICD-10-CM | POA: Insufficient documentation

## 2016-04-23 DIAGNOSIS — R918 Other nonspecific abnormal finding of lung field: Secondary | ICD-10-CM

## 2016-04-23 DIAGNOSIS — Z720 Tobacco use: Secondary | ICD-10-CM

## 2016-04-23 NOTE — Assessment & Plan Note (Signed)
Suspect that these are benign based on size criteria but she does have significant risk factors that include a smoking history and also her cervical cancer. She is scheduled to get surveillance CT scans of her chest abdomen and pelvis serially for her cervical cancer surveillance. I will follow the pulmonary nodules with the scans. If enlarging then we may need to plan tissue diagnosis.

## 2016-04-23 NOTE — Progress Notes (Signed)
Subjective:    Patient ID: Martha Koch, female    DOB: May 10, 1956, 60 y.o.   MRN: [id number]  HPI 60 year old active smoker (24 pack years) with a history of cervical cancer (hx [id number]), hypertension. As part of her standard follow-up for her malignancy she underwent CT scans of the chest and pelvis on 02/01/16. The CT scan of her chest showed some evidence of emphysematous change. Also noted were some small nodules all less than 3 mm in size. PFT were performed on 02/29/16 for further evaluation. I have reviewed these. There is evidence for moderate obstruction without a bronchodilator response. Her lung volumes are normal. Her diffusion capacity is slightly decreased and does not correct the normal range when adjusted for her alveolar volume.   She denies any breathing trouble, she is very active, works at [employer] in [employer]. No wheeze, no cough.     Review of Systems  Constitutional: Negative for fever and unexpected weight change.  HENT: Negative for congestion, dental problem, ear pain, nosebleeds, postnasal drip, rhinorrhea, sinus pressure, sneezing, sore throat and trouble swallowing.   Eyes: Negative for redness and itching.  Respiratory: Negative for cough, chest tightness, shortness of breath and wheezing.   Cardiovascular: Negative for palpitations and leg swelling.  Gastrointestinal: Negative for nausea and vomiting.  Genitourinary: Negative for dysuria.  Musculoskeletal: Negative for joint swelling.  Skin: Negative for rash.  Neurological: Negative for headaches.  Hematological: Does not bruise/bleed easily.  Psychiatric/Behavioral: Negative for dysphoric mood. The patient is not nervous/anxious.    Past Medical History:  Diagnosis Date  . Cancer ([facility]) cervical   . Hypertension   . Radiation 06/02/14-07/13/14   pelvis 45 gray, vaginal cuff/parametrial boost to 50.4 gray  . Radiation 04/06/15-05/13/15   anterior abdominal wall 50.4 gray     Family History    Problem Relation Age of Onset  . Hypertension Father   . Cancer Other   . Diabetes Other   . Cancer Maternal Aunt     breast cancer, x 4 aunts      Social History   Social History  . Marital status: Single    Spouse name: N/A  . Number of children: 3  . Years of education: 12   Occupational History  . [employer]  [employer]   Social History Main Topics  . Smoking status: Current Every Day Smoker    Packs/day: 0.25    Years: 30.00    Types: Cigarettes  . Smokeless tobacco: Never Used     Comment: smokes 2-3 a day  . Alcohol use No  . Drug use: No  . Sexual activity: No   Other Topics Concern  . Not on file   Social History Narrative   Has 3 children. They live in [place].   Has 3 grandchildren.    Uvaldo Rising (oldest daughter)    Lives alone.      Allergies  Allergen Reactions  . Aspirin     REACTION: stomach  . Penicillins     REACTION: hives     Outpatient Medications Prior to Visit  Medication Sig Dispense Refill  . amLODipine (NORVASC) 10 MG tablet Take 1 tablet (10 mg total) by mouth daily. 90 tablet 3  . Ascorbic Acid (VITAMIN C) 1000 MG tablet Take 1,000 mg by mouth daily. Reported on 08/26/2015    . atorvastatin (LIPITOR) 20 MG tablet Take 1 tablet (20 mg total) by mouth daily. 90 tablet 3  . cholecalciferol (VITAMIN D) 1000  units tablet Take 1,000 Units by mouth daily.    . varenicline (CHANTIX CONTINUING MONTH PAK) 1 MG tablet Take 1 tablet (1 mg total) by mouth 2 (two) times daily. (Patient not taking: Reported on 03/08/2016) 56 tablet 1  . varenicline (CHANTIX STARTING MONTH PAK) 0.5 MG X 11 & 1 MG X 42 tablet Taper per packet insert (Patient not taking: Reported on 03/08/2016) 53 tablet 0   No facility-administered medications prior to visit.        Objective:   Physical Exam Vitals:   04/23/16 0929 04/23/16 0930  BP:  128/84  Pulse:  86  SpO2:  97%  Weight: 133 lb (60.3 kg)   Height: 5\' 7"  (1.702 m)    Gen: [place],  well-nourished, in no distress,  normal affect  ENT: No lesions,  mouth clear,  oropharynx clear, no postnasal drip  Neck: No JVD, no TMG, no carotid bruits  Lungs: No use of accessory muscles,clear without rales or rhonchi  Cardiovascular: RRR, heart sounds normal, no murmur or gallops, no peripheral edema  Musculoskeletal: No deformities, no cyanosis or clubbing  Neuro: alert, non focal  Skin: Warm, no lesions or rashes   02/01/16 --  COMPARISON:  CT of the chest, abdomen and pelvis 09/26/2015. PET-CT 04/27/2015.  FINDINGS: CT CHEST FINDINGS  Cardiovascular: Heart size is normal. There is no significant pericardial fluid, thickening or pericardial calcification. Mild aortic atherosclerosis, without evidence of aneurysm or dissection. No definite coronary artery calcifications are identified. Numerous collateral veins are noted throughout the upper left hemithorax, related to apparent narrowing of the left subclavian vein as it enters the thoracic cage (this is favored to be positional).  Mediastinum/Nodes: No pathologically enlarged mediastinal or hilar lymph nodes. Esophagus is normal in appearance. No axillary lymphadenopathy.  Lungs/Pleura: 3 mm nodule in the anterior aspect of the left upper lobe (image 38 of series 4) is new, but nonspecific. 2 mm left lower lobe pulmonary nodule (image 97 of series 4) is unchanged in retrospect compared to the prior examination. No other larger more suspicious appearing pulmonary nodules or masses are otherwise identified. Diffuse bronchial wall thickening with moderate centrilobular and paraseptal emphysema. No acute consolidative airspace disease. No pleural effusions.  Musculoskeletal: There are no aggressive appearing lytic or blastic lesions noted in the visualized portions of the skeleton.  CT ABDOMEN AND PELVIS FINDINGS  Hepatobiliary: No suspicious cystic or solid hepatic lesions. No intra or extrahepatic  biliary ductal dilatation. Gallbladder is normal in appearance.  Pancreas: No pancreatic mass. No pancreatic ductal dilatation. No pancreatic or peripancreatic fluid or inflammatory changes.  Spleen: Unremarkable.  Adrenals/Urinary Tract: Bilateral kidneys and bilateral adrenal glands are normal in appearance. No hydroureteronephrosis. Urinary bladder is nearly decompressed, but otherwise normal in appearance.  Stomach/Bowel: The appearance of the stomach is normal. There is no pathologic dilatation of small bowel or colon. Normal appendix.  Vascular/Lymphatic: Aortic atherosclerosis, without evidence of aneurysm or dissection in the abdominal or pelvic vasculature. No lymphadenopathy noted in the abdomen or pelvis.  Reproductive: Status post hysterectomy. No soft tissue mass adjacent to the vaginal cuff to suggest local recurrence of disease. Ovaries are not confidently identified may be surgically absent or atrophic.  Other: No significant volume of ascites.  No pneumoperitoneum.  Musculoskeletal: There are no aggressive appearing lytic or blastic lesions noted in the visualized portions of the skeleton.  IMPRESSION: 1. Status post total abdominal hysterectomy, without evidence of local recurrence of disease or definite metastatic disease in the chest, abdomen  or pelvis. 2. There are 2 tiny pulmonary nodules, one measuring 3 mm in the left upper lobe which is new, and one measuring only 2 mm in the left lower lobe which is unchanged in retrospect compared to the prior examination. These are highly nonspecific, but attention on followup studies is recommended. 3. Diffuse bronchial wall thickening with moderate centrilobular and paraseptal emphysema; imaging findings suggestive of underlying COPD. 4. Aortic atherosclerosis. 5. Additional incidental findings, as above.       Assessment & Plan:  Pulmonary nodules Suspect that these are benign based on size  criteria but she does have significant risk factors that include a smoking history and also her cervical cancer. She is scheduled to get surveillance CT scans of her chest abdomen and pelvis serially for her cervical cancer surveillance. I will follow the pulmonary nodules with the scans. If enlarging then we may need to plan tissue diagnosis.  Continuous tobacco abuse Discussed  strategies for cessation today. She is interested in setting a quit date and I have asked her to start Chantix 1 week before that date. Also gave her advice on how to prepare for the quit date.  COPD (chronic obstructive pulmonary disease) ([place]) This is GOLD A disease, based only on PFT and emphysematous change seen on her Ct  Chest. She is completely asymptomatic. I believe she needs a screening and ventilatory oximetry today. Also for her to learn how to use albuterol when necessary. He does not need a maintenance bronchodilator at this time. Most important thing for her to do is to smoking we discussed this in detail today.  Baltazar Apo, MD, PhD 04/23/2016, 9:58 AM [facility] [phone] or if no answer [phone]

## 2016-04-23 NOTE — Assessment & Plan Note (Signed)
Discussed  strategies for cessation today. She is interested in setting a quit date and I have asked her to start Chantix 1 week before that date. Also gave her advice on how to prepare for the quit date.

## 2016-04-23 NOTE — Assessment & Plan Note (Signed)
This is GOLD A disease, based only on PFT and emphysematous change seen on her Ct  Chest. She is completely asymptomatic. I believe she needs a screening and ventilatory oximetry today. Also for her to learn how to use albuterol when necessary. He does not need a maintenance bronchodilator at this time. Most important thing for her to do is to smoking we discussed this in detail today.

## 2016-04-23 NOTE — Patient Instructions (Addendum)
We need to continue to decrease your cigarettes. Our goal will be get to zero. We will start chantix 1 weeks prior to your quit date.  Get your Ct scan 05/3016 as planned.  Follow with Dr Lamonte Sakai in December to review the scans.  We will perform walking oximetry today.  We will teach you how to use albuterol; Take albuterol 2 puffs up to every 4 hours if needed for shortness of breath.

## 2016-05-09 MED FILL — AMLODIPINE BESYLATE 10 MG T: 10 | 90 days supply | Qty: 90 | Fill #3

## 2016-05-29 NOTE — Progress Notes (Signed)
GYNECOLOGIC ONCOLOGY OFFICE NOTE  Referring Provider: Dr. Purvis Kilts   Chief Complaint: Metastatic cervical cancer. S/P excision of port site recurrence  Assessment/Plan: Martha Koch is a 60 y.o. woman with stage IB1 cervical cancer initially treated in October 2015 who then presented with abdominal wall mass at the site of a robotic port and new indeterminate pulmonary lesions.  S/p excision of port site recurrence followed by abdominal wall radiation completed January 2016 No evidence of disease CT of the chest abdomen and pelvis every 6 months for 2 years, since this Martha Koch is at high risk for distant recurrent disease  CT Chest Abd/Pelvis  08/2016 Follow-up with GYN oncology in March 2018 Follow-up with Dr. Purvis Kilts 11/2016  Tobacco use Encouraged to discontinue tobacco use entirely but congratulated on reduction in conception  History of Present Illness: Martha Koch is a 60 y.o. 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 Negative nodes. The primary tumor was a moderately to poorly differentiated tumor extending from the cervix into the lower uterine segment (6.9cm largest dimension in endometrium, 3.6cm largest dimension in the cervix). The bilateral parametrium was positive for involvement (however margins were negative). The anterior deep cervical stromal margin was close (60mm) but negative. There was LVSI present as well as perineural involvement.  Radiation treatment dates: December 2 through July 13, 2014  Site/dose: Pelvis 45 gray in 25 fractions; vaginal cuff/parametrial boost to 50.4 gray  The patient has begun noting a left upper quadrant mass in the past 1 month. She denies feeling increasing size in the lesion. She does not  remember noting it before. It is not tender. She otherwise feels well and has no other symptoms concerning for recurrence.  PET scan report from 01/21/15: CHEST No hypermetabolic mediastinal or hilar nodes. No suspicious pulmonary nodules on the CT scan. Mild emphysema noted. New ill-defined sub-cm pulmonary nodules are seen in both lungs, some which show mild metabolic activity. Differential diagnosis includes pulmonary metastases and atypical infectious or inflammatory etiologies.  \IMPRESSION: 3 cm hypermetabolic left anterior abdominal wall mass, suspicious for abdominal wall metastasis. New ill-defined sub-cm bilateral pulmonary nodules, some which show mild metabolic activity. Differential diagnosis includes early pulmonary metastases, and atypical infectious or inflammatory etiologies. Continued followup by chest CT recommended in 3-4 months.  Oncology Summary:   Squamous cell carcinoma of cervix (RAF-HCC)   04/01/2014 Initial Diagnosis Squamous cell carcinoma of cervix (RAF-HCC)   04/21/2014 Surgery Robotic-assisted radical hysterectomy with upper vaginectomy, bilateral salpingo-oophorectomy, bilateral pelvic lymphadenectomy   06/02/2014 - 07/13/2014 Radiation Site/dose: Pelvis 45 gray in 25 fractions; vaginal cuff/parametrial boost to 50.4 gray   02/23/2015 Surgery LUQ port site recurrence -> Abdominal wall mass excision    Path:  Final Diagnosis    A: Abdominal wall mass, excision - Skin and subcutaneous tissue with metastatic squamous cell carcinoma - Tumor size 6 cm - Surgical margins free   Has been seen by rad oncology and medical oncology at [facility]. Received XRT to the area.  CT C/A/P 06/21/2015 without change in the size of the pulmonary nodules. No evidence of metastatic disease.  CT of the chest abdomen and pelvis March 2017 without evidence of pulmonary nodules no evidence of metastatic disease  CT 01/2016 notable for There are  2 tiny pulmonary nodules, one measuring 3 mm  in the left upper lobe which is new, and one measuring only 2 mm in the left lower lobe which is unchanged in retrospect compared to the prior examination. These are highly nonspecific, but attention on followup studies is recommended  Pap 09/29/2015 wnl  ROS: Reports weight gain, no vaginal bleeding, No cough no SOB, no changes in bowel or bladder habits, no adenopathy,  No hematuria no rectal bleeding no chest pain no palpable masses otherwise 10 point ROS negative, reduced tob use to smoke approximately 2 cigarettes per day.  Past Medical History:  Past Medical History    Past Medical History  Diagnosis Date  . Hypertension   . High cholesterol   . 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 Tobacco abuse       Past Surgical History:  Past Surgical History    Past Surgical History  Procedure Laterality Date  . Tubal ligation    . Pr lap, radical hyst w/ tube&ov, node bx Bilateral 04/21/2014    Procedure: ROBOTIC LAPAROSCOPY, SURGICAL, RADICAL HYST, BIL TOTAL PELVIC LYMPHECTOMY/PARA-AORTIC LYMPH ND SMPL, Martha Koch TUB/OVR; Surgeon: Martha Dopp, MD; Location: MAIN OR [facility]; Service: Gynecology Oncology  . Pr bil sal-ooph w/omentect,tah,rad dis,lymp Bilateral 02/23/2015    Procedure: Bilateral S&O W/Omentectomy, Tah/Radical Dissect For Debulk; W/Pelvic Lymphad/Lmtd Para-Aortic Lymphadenect; Surgeon: Martha Dopp, MD; Location: [facility]; Service: Gynecology Oncology      Family History:  Family History    Family History  Problem Relation Age of Onset  . Breast cancer    . Anesthesia problems Neg Hx   . Bleeding Disorder Neg Hx   . Clotting disorder Neg Hx       Physical Exam: Vitals:  BP (!) (P) 155/83 (BP Location: Left Arm, Patient Position: Sitting) Comment: informed Martha Koch  Pulse (P) 83   Temp (P) 98 F (36.7 C) (Oral)   Resp (P) 18   Ht (P) 5\' 7"  (1.702  m)   Wt (P) 136 lb (61.7 kg)   SpO2 (P) 100%   BMI (P) 21.30 kg/m   WD female in NAD LN: No axillary cervical inguinal or supraclavicular adenopathy Abdomen: Soft, nontender, nondistended. . No hepatosplenomegaly. No ascites. Normal bowel sounds. No hernias. LUQ surgical incision with radiation hyperpigmentation changes, no masses at port sites. Chest: CTA Genitourinary: cuff intact, vagina foreshortened, no masses.No parametrial nodularity Rectal: Good tone no masses  Back No CVAT ( no change in exam)

## 2016-05-31 ENCOUNTER — Encounter: Payer: Self-pay | Admitting: Gynecologic Oncology

## 2016-05-31 ENCOUNTER — Ambulatory Visit: Payer: Self-pay | Attending: Gynecologic Oncology | Admitting: Gynecologic Oncology

## 2016-05-31 DIAGNOSIS — E7800 Pure hypercholesterolemia, unspecified: Secondary | ICD-10-CM | POA: Insufficient documentation

## 2016-05-31 DIAGNOSIS — R918 Other nonspecific abnormal finding of lung field: Secondary | ICD-10-CM

## 2016-05-31 DIAGNOSIS — Z720 Tobacco use: Secondary | ICD-10-CM

## 2016-05-31 DIAGNOSIS — Z9221 Personal history of antineoplastic chemotherapy: Secondary | ICD-10-CM | POA: Insufficient documentation

## 2016-05-31 DIAGNOSIS — F17200 Nicotine dependence, unspecified, uncomplicated: Secondary | ICD-10-CM | POA: Insufficient documentation

## 2016-05-31 DIAGNOSIS — C538 Malignant neoplasm of overlapping sites of cervix uteri: Secondary | ICD-10-CM

## 2016-05-31 DIAGNOSIS — Z08 Encounter for follow-up examination after completed treatment for malignant neoplasm: Secondary | ICD-10-CM | POA: Insufficient documentation

## 2016-05-31 DIAGNOSIS — I10 Essential (primary) hypertension: Secondary | ICD-10-CM | POA: Insufficient documentation

## 2016-05-31 DIAGNOSIS — Z923 Personal history of irradiation: Secondary | ICD-10-CM | POA: Insufficient documentation

## 2016-05-31 DIAGNOSIS — Z8541 Personal history of malignant neoplasm of cervix uteri: Secondary | ICD-10-CM | POA: Insufficient documentation

## 2016-05-31 DIAGNOSIS — Z90710 Acquired absence of both cervix and uterus: Secondary | ICD-10-CM | POA: Insufficient documentation

## 2016-05-31 NOTE — Patient Instructions (Signed)
Plan to follow up in March 2018 with a CT scan of the chest, abdomen, and pelvis before that appointment.  Please call for any questions or concerns.

## 2016-06-01 ENCOUNTER — Telehealth: Payer: Self-pay | Admitting: Gynecologic Oncology

## 2016-06-01 NOTE — Telephone Encounter (Signed)
Left message for patient about moving Dr. Lois Huxley appt from March to June.  Advised to call for any questions or concerns.

## 2016-06-15 ENCOUNTER — Ambulatory Visit: Payer: Medicaid Other | Admitting: Emergency Medicine

## 2016-07-12 ENCOUNTER — Other Ambulatory Visit: Payer: Self-pay | Admitting: Nurse Practitioner

## 2016-09-06 ENCOUNTER — Ambulatory Visit: Payer: Self-pay | Admitting: Radiation Oncology

## 2016-09-12 ENCOUNTER — Ambulatory Visit (HOSPITAL_COMMUNITY)
Admission: RE | Admit: 2016-09-12 | Discharge: 2016-09-12 | Disposition: A | Discharge status: Home / Self Care | Payer: Self-pay | Source: Ambulatory Visit | Attending: Gynecologic Oncology | Admitting: Gynecologic Oncology

## 2016-09-12 DIAGNOSIS — R918 Other nonspecific abnormal finding of lung field: Secondary | ICD-10-CM | POA: Insufficient documentation

## 2016-09-12 DIAGNOSIS — C538 Malignant neoplasm of overlapping sites of cervix uteri: Secondary | ICD-10-CM | POA: Insufficient documentation

## 2016-09-12 DIAGNOSIS — K862 Cyst of pancreas: Secondary | ICD-10-CM | POA: Insufficient documentation

## 2016-09-12 DIAGNOSIS — J439 Emphysema, unspecified: Secondary | ICD-10-CM | POA: Insufficient documentation

## 2016-09-12 LAB — POCT I-STAT CREATININE: CREATININE: 0.9 mg/dL (ref 0.44–1.00)

## 2016-09-12 MED ORDER — IOPAMIDOL (ISOVUE-300) INJECTION 61%
INTRAVENOUS | Status: AC
  Administered 2016-09-12: 100 mL
  Filled 2016-09-12: qty 100

## 2016-09-17 NOTE — Progress Notes (Signed)
GYNECOLOGIC ONCOLOGY OFFICE NOTE  Referring Provider: Dr. Purvis Kilts   Chief Complaint: Metastatic cervical cancer. S/P excision of port site recurrence  Assessment/Plan: Martha Koch is a 60 y.o. woman with stage IB1 cervical cancer initially treated in October 2015 who then presented with abdominal wall mass at the site of a robotic port and new indeterminate pulmonary lesions.  S/p excision of port site recurrence followed by abdominal wall radiation completed January 2016 No evidence of disease CT of the chest abdomen and pelvis every 6 months for 2 years without evidence of disease Will image only for concerns for recurrence.  Pancreatic cyst noted on again on CT Chest Abd/Pelvis  08/2016 Refer to GI for evaluation  Follow-up with GYN oncology in 03/2017 Has not followed up with Dr. Ihor Dow as recommended.  Tobacco use Encouraged to discontinue tobacco use entirely but congratulated on reduction in conception  History of Present Illness: Martha Koch is a 61 y.o. 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 Negative nodes. The primary tumor was a moderately to poorly differentiated tumor extending from the cervix into the lower uterine segment (6.9cm largest dimension in endometrium, 3.6cm largest dimension in the cervix). The bilateral parametrium was positive for involvement (however margins were negative). The anterior deep cervical stromal margin was close (58mm) but negative. There was LVSI present as well as perineural involvement.  Radiation treatment dates: December 2 through July 13, 2014  Site/dose: Pelvis 45 gray in 25 fractions; vaginal cuff/parametrial boost to 50.4 gray  The patient has begun noting a left upper quadrant mass  in the past 1 month. She denies feeling increasing size in the lesion. She does not remember noting it before. It is not tender. She otherwise feels well and has no other symptoms concerning for recurrence.  PET scan report from 01/21/15: CHEST No hypermetabolic mediastinal or hilar nodes. No suspicious pulmonary nodules on the CT scan. Mild emphysema noted. New ill-defined sub-cm pulmonary nodules are seen in both lungs, some which show mild metabolic activity. Differential diagnosis includes pulmonary metastases and atypical infectious or inflammatory etiologies.  \IMPRESSION: 3 cm hypermetabolic left anterior abdominal wall mass, suspicious for abdominal wall metastasis. New ill-defined sub-cm bilateral pulmonary nodules, some which show mild metabolic activity. Differential diagnosis includes early pulmonary metastases, and atypical infectious or inflammatory etiologies. Continued followup by chest CT recommended in 3-4 months.  Oncology Summary:   Squamous cell carcinoma of cervix (RAF-HCC)   04/01/2014 Initial Diagnosis Squamous cell carcinoma of cervix (RAF-HCC)   04/21/2014 Surgery Robotic-assisted radical hysterectomy with upper vaginectomy, bilateral salpingo-oophorectomy, bilateral pelvic lymphadenectomy   06/02/2014 - 07/13/2014 Radiation Site/dose: Pelvis 45 gray in 25 fractions; vaginal cuff/parametrial boost to 50.4 gray   02/23/2015 Surgery LUQ port site recurrence -> Abdominal wall mass excision    Path:  Final Diagnosis    A: Abdominal wall mass, excision - Skin and subcutaneous tissue with metastatic squamous cell carcinoma - Tumor size 6 cm - Surgical margins free   Has been seen by rad oncology and medical oncology at [facility]. Received XRT to the area.  CT C/A/P 06/21/2015 without change in the size of the pulmonary nodules. No evidence of metastatic disease.  CT of the chest abdomen and pelvis March 2017 without evidence of  pulmonary nodules no evidence of metastatic disease  CT 01/2016 notable for There are 2 tiny pulmonary nodules, one measuring 3 mm in the left upper lobe which is new, and one measuring only 2 mm in the left lower lobe which is unchanged in retrospect compared to the prior examination. These are highly nonspecific, but attention on followup studies is recommended  Pap 09/29/2015 wnl  CT 08/2016 IMPRESSION: 1. Stable exam. No new or progressive findings on today's study. No evidence for recurrent or metastatic disease. 2. Two tiny pulmonary nodules are identified on the previous study. The 3 mm left upper lobe nodule is unchanged. The 2 mm nodule seen previously in the left lower lobe is not evident on today's study and may have resolved or may be obscured by motion artifact on today's study. No new or suspicious pulmonary nodule or mass. 3. 8 mm cystic focus in the body of pancreas, almost assuredly benign given the 2.5 year stability.   ROS: Reports weight loss, no vaginal bleeding, No cough no SOB, no changes in bowel or bladder habits, no adenopathy,  No hematuria no rectal bleeding no chest pain no palpable masses otherwise 10 point ROS negative, reduced tob use to smoke approximately 2 cigarettes per day.  Past Medical History:  Past Medical History    Past Medical History  Diagnosis Date  . Hypertension   . High cholesterol   . 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 Tobacco abuse       Past Surgical History:  Past Surgical History    Past Surgical History  Procedure Laterality Date  . Tubal ligation    . Pr lap, radical hyst w/ tube&ov, node bx Bilateral 04/21/2014    Procedure: ROBOTIC LAPAROSCOPY, SURGICAL, RADICAL HYST, BIL TOTAL PELVIC LYMPHECTOMY/PARA-AORTIC LYMPH ND SMPL, Selinda Michaels TUB/OVR; Surgeon: Devra Dopp, MD; Location: MAIN OR [facility]; Service: Gynecology Oncology  . Pr bil sal-ooph w/omentect,tah,rad dis,lymp Bilateral  02/23/2015    Procedure: Bilateral S&O W/Omentectomy, Tah/Radical Dissect For Debulk; W/Pelvic Lymphad/Lmtd Para-Aortic Lymphadenect; Surgeon: Devra Dopp, MD; Location: MAIN OR [facility]; Service: Gynecology Oncology      Family History:  Family History    Family History  Problem Relation Age of Onset  . Breast cancer    . Anesthesia problems Neg Hx   . Bleeding Disorder Neg Hx   . Clotting disorder Neg Hx       Physical Exam: Vitals:  BP (!) 168/100 (BP Location: Left Arm) Comment: RN notified  Pulse 78   Temp 97.7 F (36.5 C) (Oral)   Resp 20   Wt 132 lb 4.8 oz (60 kg)   BMI (P) 20.72 kg/m   Wt Readings from Last 3 Encounters:  09/18/16 132 lb 4.8 oz (60 kg)  05/31/16 (P) 136 lb (61.7 kg)  04/23/16 133 lb (60.3 kg)    WD female in NAD LN: No axillary cervical inguinal or supraclavicular adenopathy Abdomen: Soft, nontender, nondistended. . No hepatosplenomegaly. No ascites. Normal bowel sounds. No hernias. LUQ surgical incision with radiation hyperpigmentation changes, no masses at port sites. Chest: CTA Genitourinary: cuff intact, vagina foreshortened, no masses.No parametrial nodularity pap collected. Rectal: Good tone no masses  Back No CVAT

## 2016-09-18 ENCOUNTER — Encounter: Payer: Self-pay | Admitting: Gynecologic Oncology

## 2016-09-18 ENCOUNTER — Other Ambulatory Visit (HOSPITAL_COMMUNITY)
Admission: RE | Admit: 2016-09-18 | Discharge: 2016-09-18 | Disposition: A | Discharge status: Home / Self Care | Payer: Self-pay | Source: Ambulatory Visit | Attending: Gynecologic Oncology | Admitting: Gynecologic Oncology

## 2016-09-18 ENCOUNTER — Ambulatory Visit: Payer: Self-pay | Attending: Gynecologic Oncology | Admitting: Gynecologic Oncology

## 2016-09-18 VITALS — BP 168/100 | HR 78 | Temp 97.7°F | Resp 20 | Wt 132.3 lb

## 2016-09-18 DIAGNOSIS — Z08 Encounter for follow-up examination after completed treatment for malignant neoplasm: Secondary | ICD-10-CM | POA: Insufficient documentation

## 2016-09-18 DIAGNOSIS — Z90710 Acquired absence of both cervix and uterus: Secondary | ICD-10-CM | POA: Insufficient documentation

## 2016-09-18 DIAGNOSIS — Z90722 Acquired absence of ovaries, bilateral: Secondary | ICD-10-CM | POA: Insufficient documentation

## 2016-09-18 DIAGNOSIS — Z9221 Personal history of antineoplastic chemotherapy: Secondary | ICD-10-CM | POA: Insufficient documentation

## 2016-09-18 DIAGNOSIS — E7800 Pure hypercholesterolemia, unspecified: Secondary | ICD-10-CM | POA: Insufficient documentation

## 2016-09-18 DIAGNOSIS — R918 Other nonspecific abnormal finding of lung field: Secondary | ICD-10-CM

## 2016-09-18 DIAGNOSIS — K862 Cyst of pancreas: Secondary | ICD-10-CM | POA: Insufficient documentation

## 2016-09-18 DIAGNOSIS — Z01411 Encounter for gynecological examination (general) (routine) with abnormal findings: Secondary | ICD-10-CM | POA: Insufficient documentation

## 2016-09-18 DIAGNOSIS — C538 Malignant neoplasm of overlapping sites of cervix uteri: Secondary | ICD-10-CM

## 2016-09-18 DIAGNOSIS — Z923 Personal history of irradiation: Secondary | ICD-10-CM | POA: Insufficient documentation

## 2016-09-18 DIAGNOSIS — Z8541 Personal history of malignant neoplasm of cervix uteri: Secondary | ICD-10-CM | POA: Insufficient documentation

## 2016-09-18 DIAGNOSIS — I10 Essential (primary) hypertension: Secondary | ICD-10-CM | POA: Insufficient documentation

## 2016-09-18 NOTE — Patient Instructions (Signed)
Dr. Skeet Latch will call you with the results of the PAP Smear when results available. Call in July to set up an appointment with Dr. Skeet Latch in September.  Dr. Skeet Latch placed an order for GI referral for evaluation of Pancreatic cyst.

## 2016-09-19 ENCOUNTER — Other Ambulatory Visit: Payer: Self-pay

## 2016-09-19 DIAGNOSIS — C539 Malignant neoplasm of cervix uteri, unspecified: Secondary | ICD-10-CM

## 2016-09-19 DIAGNOSIS — C538 Malignant neoplasm of overlapping sites of cervix uteri: Secondary | ICD-10-CM

## 2016-09-21 LAB — CYTOLOGY - PAP: Diagnosis: NEGATIVE

## 2016-09-27 ENCOUNTER — Ambulatory Visit (INDEPENDENT_AMBULATORY_CARE_PROVIDER_SITE_OTHER): Payer: Self-pay | Admitting: Emergency Medicine

## 2016-09-27 ENCOUNTER — Encounter: Payer: Self-pay | Admitting: Emergency Medicine

## 2016-09-27 DIAGNOSIS — Z720 Tobacco use: Secondary | ICD-10-CM

## 2016-09-27 DIAGNOSIS — J449 Chronic obstructive pulmonary disease, unspecified: Secondary | ICD-10-CM

## 2016-09-27 MED ORDER — NICOTINE 7 MG/24HR TD PT24: 7.0000 mg | MEDICATED_PATCH | Freq: Every day | TRANSDERMAL | 1 refills | Status: DC

## 2016-09-27 MED ORDER — ALBUTEROL SULFATE HFA 108 (90 BASE) MCG/ACT IN AERS: 2.0000 | INHALATION_SPRAY | Freq: Four times a day (QID) | RESPIRATORY_TRACT | 6 refills | Status: DC | PRN

## 2016-09-27 NOTE — Assessment & Plan Note (Signed)
Stable. Less than 32mm. Unclear that these need to be followed, but I will do surveillance on them when she gets her imaging for her cervical CA.

## 2016-09-27 NOTE — Progress Notes (Signed)
Subjective:    Patient ID: Martha Koch, female    DOB: 1956-06-27, 61 y.o.   MRN: [id number]  HPI 61 year old active smoker (24 pack years) with a history of cervical cancer ([id number]), hypertension. As part of her standard follow-up for her malignancy she underwent CT scans of the chest and pelvis on 02/01/16. The CT scan of her chest showed some evidence of emphysematous change. Also noted were some small nodules all less than 3 mm in size. PFT were performed on 02/29/16 for further evaluation. I have reviewed these. There is evidence for moderate obstruction without a bronchodilator response. Her lung volumes are normal. Her diffusion capacity is slightly decreased and does not correct the normal range when adjusted for her alveolar volume.   She denies any breathing trouble, she is very active, works at [employer] in [employer]. No wheeze, no cough.   ROV 09/27/16 -- This is a follow-up visit for patient with a history of tobacco use, newly diagnosed COPD, emphysema and small pulmonary nodules noted on CT scan of the chest. Her initial visit we discussed smoking cessation and she started Chantix. Started Albuterol for her to use as needed. Repeat CT scan of her chest was done on 09/12/16 that I have personally reviewed. This shows no change in her 3 mm left upper lobe nodule. Her 2 mm left lower lobe nodule is not seen on the current study. She did chantix for 2 weeks, states that she missed some doses, did not have side effects over the 2 weeks that she took it. She had 2 stretches when she stopped smoking altogether. She goes back to smoking at work, usually associated w stress. She is currently smoking about 4 cigarettes, is interested in doing nicotine. We discussed Quit-line.     Review of Systems  Constitutional: Negative for fever and unexpected weight change.  HENT: Negative for congestion, dental problem, ear pain, nosebleeds, postnasal drip, rhinorrhea, sinus pressure, sneezing,  sore throat and trouble swallowing.   Eyes: Negative for redness and itching.  Respiratory: Negative for cough, chest tightness, shortness of breath and wheezing.   Cardiovascular: Negative for palpitations and leg swelling.  Gastrointestinal: Negative for nausea and vomiting.  Genitourinary: Negative for dysuria.  Musculoskeletal: Negative for joint swelling.  Skin: Negative for rash.  Neurological: Negative for headaches.  Hematological: Does not bruise/bleed easily.  Psychiatric/Behavioral: Negative for dysphoric mood. The patient is not nervous/anxious.    Past Medical History:  Diagnosis Date  . Cancer (HCC) cervical   . Hypertension   . Radiation 06/02/14-07/13/14   pelvis 45 gray, vaginal cuff/parametrial boost to 50.4 gray  . Radiation 04/06/15-05/13/15   anterior abdominal wall 50.4 gray     Family History  Problem Relation Age of Onset  . Hypertension Father   . Cancer Other   . Diabetes Other   . Cancer Maternal Aunt     breast cancer, x 4 aunts      Social History   Social History  . Marital status: Single    Spouse name: N/A  . Number of children: 3  . Years of education: 12   Occupational History  . [employer]   Social History Main Topics  . Smoking status: Current Every Day Smoker    Packs/day: 0.25    Years: 30.00    Types: Cigarettes  . Smokeless tobacco: Never Used     Comment: smokes 2-3 a day  . Alcohol use No  . Drug use:  No  . Sexual activity: No   Other Topics Concern  . Not on file   Social History Narrative   Has 3 children. They live in [place].   Has 3 grandchildren.    Uvaldo Rising (oldest daughter)    Lives alone.      Allergies  Allergen Reactions  . Aspirin     REACTION: stomach  . Penicillins     REACTION: hives     Outpatient Medications Prior to Visit  Medication Sig Dispense Refill  . amLODipine (NORVASC) 10 MG tablet Take 1 tablet (10 mg total) by mouth daily. 90 tablet 3  . Ascorbic  Acid (VITAMIN C) 1000 MG tablet Take 1,000 mg by mouth daily. Reported on 08/26/2015    . atorvastatin (LIPITOR) 20 MG tablet Take 1 tablet (20 mg total) by mouth daily. 90 tablet 3  . cholecalciferol (VITAMIN D) 1000 units tablet Take 1,000 Units by mouth daily.     No facility-administered medications prior to visit.        Objective:   Physical Exam Vitals:   09/27/16 0950  BP: 140/90  Pulse: 91  SpO2: 99%  Weight: 131 lb 12.8 oz (59.8 kg)  Height: 5\' 7"  (1.702 m)   Gen: Pleasant, well-nourished, in no distress,  normal affect  ENT: No lesions,  mouth clear,  oropharynx clear, no postnasal drip  Neck: No JVD, no TMG, no carotid bruits  Lungs: No use of accessory muscles,clear without rales or rhonchi  Cardiovascular: RRR, heart sounds normal, no murmur or gallops, no peripheral edema  Musculoskeletal: No deformities, no cyanosis or clubbing  Neuro: alert, non focal  Skin: Warm, no lesions or rashes   02/01/16 --  COMPARISON:  CT of the chest, abdomen and pelvis 09/26/2015. PET-CT 04/27/2015.  FINDINGS: CT CHEST FINDINGS  Cardiovascular: Heart size is normal. There is no significant pericardial fluid, thickening or pericardial calcification. Mild aortic atherosclerosis, without evidence of aneurysm or dissection. No definite coronary artery calcifications are identified. Numerous collateral veins are noted throughout the upper left hemithorax, related to apparent narrowing of the left subclavian vein as it enters the thoracic cage (this is favored to be positional).  Mediastinum/Nodes: No pathologically enlarged mediastinal or hilar lymph nodes. Esophagus is normal in appearance. No axillary lymphadenopathy.  Lungs/Pleura: 3 mm nodule in the anterior aspect of the left upper lobe (image 38 of series 4) is new, but nonspecific. 2 mm left lower lobe pulmonary nodule (image 97 of series 4) is unchanged in retrospect compared to the prior examination. No other  larger more suspicious appearing pulmonary nodules or masses are otherwise identified. Diffuse bronchial wall thickening with moderate centrilobular and paraseptal emphysema. No acute consolidative airspace disease. No pleural effusions.  Musculoskeletal: There are no aggressive appearing lytic or blastic lesions noted in the visualized portions of the skeleton.  CT ABDOMEN AND PELVIS FINDINGS  Hepatobiliary: No suspicious cystic or solid hepatic lesions. No intra or extrahepatic biliary ductal dilatation. Gallbladder is normal in appearance.  Pancreas: No pancreatic mass. No pancreatic ductal dilatation. No pancreatic or peripancreatic fluid or inflammatory changes.  Spleen: Unremarkable.  Adrenals/Urinary Tract: Bilateral kidneys and bilateral adrenal glands are normal in appearance. No hydroureteronephrosis. Urinary bladder is nearly decompressed, but otherwise normal in appearance.  Stomach/Bowel: The appearance of the stomach is normal. There is no pathologic dilatation of small bowel or colon. Normal appendix.  Vascular/Lymphatic: Aortic atherosclerosis, without evidence of aneurysm or dissection in the abdominal or pelvic vasculature. No lymphadenopathy noted in the  abdomen or pelvis.  Reproductive: Status post hysterectomy. No soft tissue mass adjacent to the vaginal cuff to suggest local recurrence of disease. Ovaries are not confidently identified may be surgically absent or atrophic.  Other: No significant volume of ascites.  No pneumoperitoneum.  Musculoskeletal: There are no aggressive appearing lytic or blastic lesions noted in the visualized portions of the skeleton.  IMPRESSION: 1. Status post total abdominal hysterectomy, without evidence of local recurrence of disease or definite metastatic disease in the chest, abdomen or pelvis. 2. There are 2 tiny pulmonary nodules, one measuring 3 mm in the left upper lobe which is new, and one measuring  only 2 mm in the left lower lobe which is unchanged in retrospect compared to the prior examination. These are highly nonspecific, but attention on followup studies is recommended. 3. Diffuse bronchial wall thickening with moderate centrilobular and paraseptal emphysema; imaging findings suggestive of underlying COPD. 4. Aortic atherosclerosis. 5. Additional incidental findings, as above.       Assessment & Plan:  COPD (chronic obstructive pulmonary disease) (HCC) Moderate COPD by pulmonary function testing although she has minimal clinical symptoms. We have concentrated on smoking cessation. She will use albuterol when necessary.  Pulmonary nodules Stable. Less than 45mm. Unclear that these need to be followed, but I will do surveillance on them when she gets her imaging for her cervical CA.   Continuous tobacco abuse Discussed cessation today. She tolerated Chantix but had a hard time taking reliably. We will try nicotine patches. She is smoking 4 cig a day and is motivated to stop.   Baltazar Apo, MD, PhD 09/27/2016, 10:12 AM [facility] [phone] or if no answer [phone]

## 2016-09-27 NOTE — Patient Instructions (Signed)
We will make sure to get you your albuterol inhaler, to use 2 puffs as needed for shortness of breath.  We will use nicotine patches 7mg  to help with smoking cessation. Start putting them on the day before you quit. Then stop smoking.  Your CT chest from March shows that your pulmonary nodules are very small and are unchanged. We can discuss in the future whether there is any benefit to repeat a scan Follow with Dr Lamonte Sakai in 6 months or sooner if you have any problems

## 2016-09-27 NOTE — Assessment & Plan Note (Signed)
Discussed cessation today. She tolerated Chantix but had a hard time taking reliably. We will try nicotine patches. She is smoking 4 cig a day and is motivated to stop.

## 2016-09-27 NOTE — Assessment & Plan Note (Signed)
Moderate COPD by pulmonary function testing although she has minimal clinical symptoms. We have concentrated on smoking cessation. She will use albuterol when necessary.

## 2016-10-02 ENCOUNTER — Telehealth: Payer: Self-pay

## 2016-10-02 NOTE — Telephone Encounter (Signed)
Pt notified about pap results: negative.  No questions or concerns voiced. 

## 2016-10-31 ENCOUNTER — Ambulatory Visit: Payer: Self-pay | Admitting: Gastroenterology

## 2016-11-13 ENCOUNTER — Ambulatory Visit: Payer: Self-pay | Admitting: Gastroenterology

## 2016-11-13 ENCOUNTER — Other Ambulatory Visit: Payer: Self-pay

## 2016-11-14 ENCOUNTER — Encounter: Payer: Self-pay | Admitting: Family Medicine

## 2016-11-19 ENCOUNTER — Encounter: Payer: Self-pay | Admitting: Gynecologic Oncology

## 2016-11-29 ENCOUNTER — Telehealth: Payer: Self-pay | Admitting: *Deleted

## 2016-12-03 ENCOUNTER — Telehealth: Payer: Self-pay | Admitting: *Deleted

## 2016-12-03 NOTE — Telephone Encounter (Signed)
Attempted to contact the patient regarding her missed appt with [facility]. Left message for the patient to call the office.

## 2016-12-04 ENCOUNTER — Telehealth: Payer: Self-pay | Admitting: *Deleted

## 2016-12-04 NOTE — Telephone Encounter (Signed)
Spoke with the patient regarding her missed GI consult. Patient stated that the MD office needed money up front, she was trying to get that together, and call them back.

## 2016-12-04 NOTE — Telephone Encounter (Signed)
Open by mistake

## 2016-12-06 ENCOUNTER — Encounter: Payer: Self-pay | Admitting: Radiation Oncology

## 2016-12-06 ENCOUNTER — Ambulatory Visit
Admission: RE | Admit: 2016-12-06 | Discharge: 2016-12-06 | Disposition: A | Discharge status: Home / Self Care | Payer: Self-pay | Source: Ambulatory Visit | Attending: Radiation Oncology | Admitting: Radiation Oncology

## 2016-12-06 VITALS — BP 156/86 | HR 76 | Temp 97.8°F | Ht 67.0 in | Wt 132.2 lb

## 2016-12-06 DIAGNOSIS — C539 Malignant neoplasm of cervix uteri, unspecified: Secondary | ICD-10-CM

## 2016-12-06 DIAGNOSIS — Z79899 Other long term (current) drug therapy: Secondary | ICD-10-CM | POA: Insufficient documentation

## 2016-12-06 DIAGNOSIS — R1902 Left upper quadrant abdominal swelling, mass and lump: Secondary | ICD-10-CM

## 2016-12-06 DIAGNOSIS — Z886 Allergy status to analgesic agent status: Secondary | ICD-10-CM | POA: Insufficient documentation

## 2016-12-06 DIAGNOSIS — Z880 Allergy status to penicillin: Secondary | ICD-10-CM | POA: Insufficient documentation

## 2016-12-06 NOTE — Progress Notes (Signed)
Martha Koch is here for follow up.  She denies having any pain.  She denies having any bladder/bowel issues, nausea, vaginal bleeding or discharge.  She reports having a good appetite and energy level.  She is working full time.  BP (!) 156/86 (BP Location: Left Arm, Patient Position: Sitting)   Pulse 76   Temp 97.8 F (36.6 C) (Oral)   Ht 5\' 7"  (1.702 m)   Wt 132 lb 3.2 oz (60 kg)   SpO2 100%   BMI 20.71 kg/m    Wt Readings from Last 3 Encounters:  12/06/16 132 lb 3.2 oz (60 kg)  09/18/16 132 lb 4.8 oz (60 kg)  05/31/16 (P) 136 lb (61.7 kg)

## 2016-12-06 NOTE — Progress Notes (Signed)
Radiation Oncology         [phone]) [phone] ________________________________  Name: Martha Koch MRN: [id number]  Date: 12/06/2016  DOB: [birth year]  Follow-Up Visit Note  CC: [name], MD  [name], MD    ICD-10-CM   1. Cervical cancer, FIGO stage IB1 (HCC) C53.9   2. Abdominal wall mass of left upper quadrant R19.02     Diagnosis:   Stage IB1 invasive squamous carcinoma of the cervix, high risk features,  isolated recurrence in the abdominal wall  Radiation Treatment Dates:   04/06/2015-05/13/2015: Surgical bed along the anterior abdominal wall, 50.4 gray in 28 fractions, previous post-op pelvic XRT  06/02/14 - 07/13/14: Pelvis 45 gray in 25 fractions; vaginal cuff/parametrial boost to 50.4 gray  Narrative:  Martha Koch here for follow up. The patient followed up with Dr. Skeet Latch on 09/18/16 who noted no mass on pelvic exam. Last Pap smear on 09/18/16 was negative for malignancy. CT of the chest/abd/pelvis on 09/12/16 showed no new or progressive findings. Previously identified nodule in the LUL is unchanged and nodule in the LLL was not evident on this study.  She denies having any pain, bladder/bowel issues, vaginal/rectal bleeding, or nausea.  She reports her appetite is good.  She reports her energy level is good and she is working full time.  She is not using a vaginal dilator.  ALLERGIES:  is allergic to aspirin and penicillins.  Meds: Current Outpatient Prescriptions  Medication Sig Dispense Refill  . amLODipine (NORVASC) 10 MG tablet Take 1 tablet (10 mg total) by mouth daily. 90 tablet 3  . Ascorbic Acid (VITAMIN C) 1000 MG tablet Take 1,000 mg by mouth daily. Reported on 08/26/2015    . cholecalciferol (VITAMIN D) 1000 units tablet Take 1,000 Units by mouth daily.    Marland Kitchen albuterol (PROVENTIL HFA;VENTOLIN HFA) 108 (90 Base) MCG/ACT inhaler Inhale 2 puffs into the lungs every 6 (six) hours as needed for wheezing or shortness of breath. (Patient not taking:  Reported on 12/06/2016) 1 Inhaler 6  . atorvastatin (LIPITOR) 20 MG tablet Take 1 tablet (20 mg total) by mouth daily. (Patient not taking: Reported on 12/06/2016) 90 tablet 3  . nicotine (NICODERM CQ - DOSED IN MG/24 HR) 7 mg/24hr patch Place 1 patch (7 mg total) onto the skin daily. (Patient not taking: Reported on 12/06/2016) 7 patch 1   No current facility-administered medications for this encounter.     Physical Findings: The patient is in no acute distress. Patient is alert and oriented.  height is 5\' 7"  (1.702 m) and weight is 132 lb 3.2 oz (60 kg). Her oral temperature is 97.8 F (36.6 C). Her blood pressure is 156/86 (abnormal) and her pulse is 76. Her oxygen saturation is 100%.   Lungs are clear to auscultation bilaterally. Heart has regular rate and rhythm. No palpable cervical, supraclavicular, or axillary adenopathy. Abdomen soft, non-tender, normal bowel sounds.  Hyperpigmentation changes along her surgical scar in the left upper abdomen. No palpable or visible signs of recurrence. No inguinal adenopathy. On pelvic examination the external genitalia were unremarkable. A speculum exam was performed. There are no mucosal lesions noted in the vaginal vault. Mild radiation changes at the vaginal cuff. On bimanual and rectovaginal examination there were no pelvic masses appreciated.  Lab Findings: Lab Results  Component Value Date   WBC 3.1 (L) 11/18/2014   HGB 12.8 11/18/2014   HCT 38.9 11/18/2014   MCV 82.4 11/18/2014   PLT 255 11/18/2014    Radiographic  Findings: No results found.  Impression: No evidence of recurrence on 09/12/16 CT or clinic exam today. Pap smear was not performed on this visit in light of her recent pap smear with Dr. Skeet Latch.  Plan:  She is to follow up in radiation oncology in 6 months.   Gyn/onc follow-up 03/2017.  -----------------------------------  Blair Promise, PhD, MD  This document serves as a record of services personally performed by Gery Pray, MD. It was created on his behalf by Bethann Humble, a trained medical scribe. The creation of this record is based on the scribe's personal observations and the provider's statements to them. This document has been checked and approved by the attending provider.

## 2016-12-11 ENCOUNTER — Telehealth: Payer: Self-pay | Admitting: *Deleted

## 2016-12-11 NOTE — Telephone Encounter (Signed)
CALLED PATIENT TO INFORM OF FU APPT. ON 06-13-17 @ 10 AM WITH DR. KINARD, LVM FOR A RETURN CALL

## 2017-02-08 ENCOUNTER — Ambulatory Visit: Payer: Self-pay | Admitting: Gastroenterology

## 2017-02-08 ENCOUNTER — Ambulatory Visit (INDEPENDENT_AMBULATORY_CARE_PROVIDER_SITE_OTHER): Payer: Self-pay | Admitting: Gastroenterology

## 2017-02-08 ENCOUNTER — Encounter: Payer: Self-pay | Admitting: Gastroenterology

## 2017-02-08 VITALS — BP 140/72 | HR 79 | Ht 67.0 in | Wt 129.0 lb

## 2017-02-08 DIAGNOSIS — K862 Cyst of pancreas: Secondary | ICD-10-CM

## 2017-02-08 NOTE — Patient Instructions (Signed)
Please follow up with Dr. Loletha Carrow as needed.  If you are age 62 or older, your body mass index should be between 23-30. Your Body mass index is 20.2 kg/m. If this is out of the aforementioned range listed, please consider follow up with your Primary Care Provider.  If you are age 27 or younger, your body mass index should be between 19-25. Your Body mass index is 20.2 kg/m. If this is out of the aformentioned range listed, please consider follow up with your Primary Care Provider.   Thank you for choosing [facility]  Dr Wilfrid Lund III

## 2017-02-08 NOTE — Progress Notes (Signed)
[facility] Consult Note:  History: Martha Koch 02/08/2017  Referring physician: Dr. [name]  Reason for consult/chief complaint: abnormal CT scan (pancreatic cyst shown on scan.)   Subjective  HPI:  This is a 61 year old woman referred by her gynecologic oncologist for the incidental finding of a pancreatic cyst on surveillance CT scan. Martha Koch was diagnosed with cervical cancer several years ago and underwent surgery and radiation therapy. She has had follow-up CT scans ever since then, and the last scan from March of this year revealed an 8 mm pancreatic cyst. The report from that scan indicates that this cyst is unchanged from a scan on October 2015. Curiously, the [place]crete[place] cyst is not mentioned in the report of any scans prior to this recent one, but the recent report clearly indicates that it was present before and is stable.  Martha Koch feels well, denies abdominal pain, nausea, vomiting, early satiety or weight loss. Her bowel habits are regular without rectal bleeding. Her last screening colonoscopy was normal in 2011, so she will be due again in 2021.  ROS:  Review of Systems  She denies chest pain dyspnea or dysuria  Past Medical History: Past Medical History:  Diagnosis Date  . Cancer (HCC) cervical   . Hypertension   . Radiation 06/02/14-07/13/14   pelvis 45 gray, vaginal cuff/parametrial boost to 50.4 gray  . Radiation 04/06/15-05/13/15   anterior abdominal wall 50.4 gray     Past Surgical History: Past Surgical History:  Procedure Laterality Date  . ABDOMINAL HYSTERECTOMY    . robotic radical hysterectomy, bso, pelvic lymphadenectomy Bilateral 04/21/14  . TUBAL LIGATION       Family History: Family History  Problem Relation Age of Onset  . Hypertension Father   . Cancer Other   . Diabetes Other   . Cancer Maternal Aunt        breast cancer, x 4 aunts     Social History: Social History   Social History  .  Marital status: Single    Spouse name: N/A  . Number of children: 3  . Years of education: 12   Occupational History  . [employer]   Social History Main Topics  . Smoking status: Current Every Day Smoker    Packs/day: 0.25    Years: 30.00    Types: Cigarettes  . Smokeless tobacco: Never Used     Comment: smokes 2-3 a day  . Alcohol use No  . Drug use: No  . Sexual activity: No   Other Topics Concern  . None   Social History Narrative   Has 3 children. They live in [place].   Has 3 grandchildren.    Uvaldo Rising (oldest daughter)    Lives alone.     Allergies: Allergies  Allergen Reactions  . Aspirin     REACTION: stomach  . Penicillins     REACTION: hives    Outpatient Meds: Current Outpatient Prescriptions  Medication Sig Dispense Refill  . albuterol (PROVENTIL HFA;VENTOLIN HFA) 108 (90 Base) MCG/ACT inhaler Inhale 2 puffs into the lungs every 6 (six) hours as needed for wheezing or shortness of breath. 1 Inhaler 6  . amLODipine (NORVASC) 10 MG tablet Take 1 tablet (10 mg total) by mouth daily. 90 tablet 3  . Ascorbic Acid (VITAMIN C) 1000 MG tablet Take 1,000 mg by mouth daily. Reported on 08/26/2015    . atorvastatin (LIPITOR) 20 MG tablet Take 1 tablet (20 mg total) by mouth daily. [place]  tablet 3  . cholecalciferol (VITAMIN D) 1000 units tablet Take 1,000 Units by mouth daily.     No current facility-administered medications for this visit.       ___________________________________________________________________ Objective   Exam:  BP 140/72 (BP Location: Left Arm, Patient Position: Sitting, Cuff Size: Normal)   Pulse 79   Ht 5\' 7"  (1.702 m)   Wt 129 lb (58.5 kg)   BMI 20.20 kg/m    General: this is a(n) Well-appearing woman   Eyes: sclera anicteric, no redness  ENT: oral mucosa moist without lesions, no cervical or supraclavicular lymphadenopathy, good dentition  CV: RRR without murmur, S1/S2, no JVD, no peripheral  edema  Resp: clear to auscultation bilaterally, normal RR and effort noted  GI: soft, no tenderness, with active bowel sounds. No guarding or palpable organomegaly noted. Upper abdominal scar with a small reducible abdominal wall hernia  Skin; warm and dry, no rash or jaundice noted  Neuro: awake, alert and oriented x 3. Normal gross motor function and fluent speech  Labs:  CT scan reports over the last several years were reviewed. Images from this most recent CT scan were personally reviewed and shown to the patient.  There is no pancreatic ductal dilatation.  Assessment: Encounter Diagnosis  Name Primary?  . Pancreatic cyst Yes  The radiology report indicates an ACR guideline that the patient should have a repeat CT scan in a  year.  I think a better plan, based on recent ACG guidelines, are that you obtain an MRI of the abdomen in 2 years. If the lesion is benign appearing and stable at that point, no further surveillance imaging would be needed.  Joel was reassured to hear this.  Thank you for the courtesy of this consult.  Please call me with any questions or concerns.  Nelida Meuse III  CC: [name], MD Dr. [name], gynecologic oncology

## 2017-03-28 ENCOUNTER — Ambulatory Visit
Admission: RE | Admit: 2017-03-28 | Discharge: 2017-03-28 | Disposition: A | Discharge status: Home / Self Care | Payer: No Typology Code available for payment source | Source: Ambulatory Visit | Attending: Family Medicine | Admitting: Family Medicine

## 2017-03-28 ENCOUNTER — Other Ambulatory Visit: Payer: Self-pay | Admitting: Family Medicine

## 2017-03-28 DIAGNOSIS — Z1231 Encounter for screening mammogram for malignant neoplasm of breast: Secondary | ICD-10-CM

## 2017-05-09 ENCOUNTER — Ambulatory Visit: Payer: Self-pay | Attending: Family Medicine | Admitting: Family Medicine

## 2017-05-09 ENCOUNTER — Encounter: Payer: Self-pay | Admitting: Family Medicine

## 2017-05-09 VITALS — BP 161/94 | HR 85 | Temp 98.7°F | Resp 18 | Ht 67.0 in | Wt 131.2 lb

## 2017-05-09 DIAGNOSIS — E785 Hyperlipidemia, unspecified: Secondary | ICD-10-CM | POA: Insufficient documentation

## 2017-05-09 DIAGNOSIS — Z79899 Other long term (current) drug therapy: Secondary | ICD-10-CM | POA: Insufficient documentation

## 2017-05-09 DIAGNOSIS — I10 Essential (primary) hypertension: Secondary | ICD-10-CM | POA: Insufficient documentation

## 2017-05-09 DIAGNOSIS — J449 Chronic obstructive pulmonary disease, unspecified: Secondary | ICD-10-CM | POA: Insufficient documentation

## 2017-05-09 DIAGNOSIS — Z8541 Personal history of malignant neoplasm of cervix uteri: Secondary | ICD-10-CM | POA: Insufficient documentation

## 2017-05-09 LAB — POCT UA - MICROALBUMIN
Creatinine, POC: 200 mg/dL
Microalbumin Ur, POC: 10 mg/L

## 2017-05-09 MED ORDER — AMLODIPINE BESYLATE 10 MG PO TABS: 10.0000 mg | ORAL_TABLET | Freq: Every day | ORAL | 3 refills | Status: DC

## 2017-05-09 MED FILL — AMLODIPINE BESYLATE 10 MG T: 10 | 90 days supply | Qty: 90 | Fill #0 | Status: TO

## 2017-05-09 NOTE — Progress Notes (Signed)
Patient is here for f/up HTN  Patient been without meds for a month

## 2017-05-09 NOTE — Patient Instructions (Signed)

## 2017-05-09 NOTE — Progress Notes (Signed)
   Subjective:  Patient ID: Martha Koch, female    DOB: [birth year]  Age: 61 y.o. MRN: [id number]  CC: Hypertension   HPI Martha Koch presents to reestablish care and medication refill.  Past medical history hypertension, COPD, cervical cancer.  She is under the care of her pulmonologist and oncologist.Hypertension: She is not exercising and is not adherent to low salt diet.  She does not check BP at home. Cardiac symptoms none. Patient denies chest pain, chest pressure/discomfort, claudication, dyspnea, lower extremity edema, near-syncope, palpitations and syncope.  Cardiovascular risk factors: dyslipidemia, hypertension, obesity (BMI >= 30 kg/m2), sedentary lifestyle and smoking/ tobacco exposure.  She reports smoking 6 cigarettes/day, smoking history of 20 years. She denies any chronic cough or hemoptysis.  Use of agents associated with hypertension: none. History of target organ damage: none.   Outpatient Medications Prior to Visit  Medication Sig Dispense Refill  . albuterol (PROVENTIL HFA;VENTOLIN HFA) 108 (90 Base) MCG/ACT inhaler Inhale 2 puffs into the lungs every 6 (six) hours as needed for wheezing or shortness of breath. (Patient not taking: Reported on 05/09/2017) 1 Inhaler 6  . Ascorbic Acid (VITAMIN C) 1000 MG tablet Take 1,000 mg by mouth daily. Reported on 08/26/2015    . atorvastatin (LIPITOR) 20 MG tablet Take 1 tablet (20 mg total) by mouth daily. (Patient not taking: Reported on 05/09/2017) 90 tablet 3  . cholecalciferol (VITAMIN D) 1000 units tablet Take 1,000 Units by mouth daily.    Marland Kitchen amLODipine (NORVASC) 10 MG tablet Take 1 tablet (10 mg total) by mouth daily. (Patient not taking: Reported on 05/09/2017) 90 tablet 3   No facility-administered medications prior to visit.     ROS Review of Systems  Constitutional: Negative.   Respiratory: Negative.   Cardiovascular: Negative.   Skin: Negative.       Objective:  BP (!) 161/94 (BP Location: Left Arm,  Patient Position: Sitting, Cuff Size: Normal)   Pulse 85   Temp 98.7 F (37.1 C) (Oral)   Resp 18   Ht 5\' 7"  (1.702 m)   Wt 131 lb 3.2 oz (59.5 kg)   SpO2 100%   BMI 20.55 kg/m   BP/Weight 05/09/2017 8/50/2774 07/03/8784  Systolic BP 767 209 470  Diastolic BP 94 72 86  Wt. (Lbs) 131.2 129 132.2  BMI 20.55 20.2 20.71     Physical Exam  Constitutional: She appears well-developed and well-nourished.  Neck: No JVD present.  Cardiovascular: Normal rate, regular rhythm, normal heart sounds and intact distal pulses.  Pulmonary/Chest: Effort normal and breath sounds normal.  Abdominal: There is no tenderness.  Skin: Skin is warm and dry.  Nursing note and vitals reviewed.    Assessment & Plan:   1. Essential hypertension Schedule BP recheck in 2 weeks with nurse. If BP is greater than 90/60 (MAP 65 or greater) but not less than 130/80 may add dose HCTZ 25 mg daily and recheck in another 2 weeks with nurse.  - POCT UA - Microalbumin - CMP and Liver - amLODipine (NORVASC) 10 MG tablet; Take 1 tablet (10 mg total) daily by mouth.  Dispense: 90 tablet; Refill: 3  2. Dyslipidemia  - Lipid Panel     Follow-up: Return in about 2 weeks (around 05/23/2017) for BP check with [place].   Alfonse Spruce FNP

## 2017-05-10 LAB — CMP AND LIVER
ALK PHOS: 68 IU/L (ref 39–117)
ALT: 9 IU/L (ref 0–32)
AST: 14 IU/L (ref 0–40)
Albumin: 4.4 g/dL (ref 3.6–4.8)
BUN: 12 mg/dL (ref 8–27)
Bilirubin Total: <0.2 mg/dL (ref 0.0–1.2)
Bilirubin, Direct: 0.07 mg/dL (ref 0.00–0.40)
CO2: 27 mmol/L (ref 20–29)
CREATININE: 0.9 mg/dL (ref 0.57–1.00)
Calcium: 9.7 mg/dL (ref 8.7–10.3)
Chloride: 105 mmol/L (ref 96–106)
GFR calc Af Amer: 79 mL/min/{1.73_m2} (ref >59)
GFR, EST NON AFRICAN AMERICAN: 69 mL/min/{1.73_m2} (ref >59)
GLUCOSE: 79 mg/dL (ref 65–99)
Potassium: 4.7 mmol/L (ref 3.5–5.2)
Sodium: 143 mmol/L (ref 134–144)
Total Protein: 6.7 g/dL (ref 6.0–8.5)

## 2017-05-10 LAB — LIPID PANEL
CHOL/HDL RATIO: 2.2 ratio (ref 0.0–4.4)
Cholesterol, Total: 182 mg/dL (ref 100–199)
HDL: 84 mg/dL (ref >39)
LDL CALC: 89 mg/dL (ref 0–99)
TRIGLYCERIDES: 44 mg/dL (ref 0–149)
VLDL CHOLESTEROL CAL: 9 mg/dL (ref 5–40)

## 2017-05-21 ENCOUNTER — Other Ambulatory Visit: Payer: Self-pay | Admitting: Family Medicine

## 2017-05-21 DIAGNOSIS — Z8639 Personal history of other endocrine, nutritional and metabolic disease: Secondary | ICD-10-CM | POA: Insufficient documentation

## 2017-05-21 DIAGNOSIS — I10 Essential (primary) hypertension: Secondary | ICD-10-CM

## 2017-05-21 MED ORDER — ATORVASTATIN CALCIUM 20 MG PO TABS: 20.0000 mg | ORAL_TABLET | Freq: Every day | ORAL | 2 refills | Status: DC

## 2017-05-28 ENCOUNTER — Telehealth: Payer: Self-pay

## 2017-05-28 NOTE — Telephone Encounter (Signed)
CMA call regarding lab results   Patient did not answer but left a detailed message per DPR & to call back if have any questions

## 2017-05-28 NOTE — Telephone Encounter (Signed)
-----   Message from Alfonse Spruce, [place] sent at 05/21/2017  1:29 PM EST ----- Lipid levels were normal with current atorvastatin dosage. Start eating a diet low in saturated fat. Limit your intake of fried foods, red meats, and whole milk. Increase physical activity.  Liver function normal Kidney function normal

## 2017-06-13 ENCOUNTER — Ambulatory Visit
Admission: RE | Admit: 2017-06-13 | Discharge: 2017-06-13 | Disposition: A | Discharge status: Home / Self Care | Payer: No Typology Code available for payment source | Source: Ambulatory Visit | Attending: Radiation Oncology | Admitting: Radiation Oncology

## 2017-06-13 DIAGNOSIS — Z880 Allergy status to penicillin: Secondary | ICD-10-CM | POA: Insufficient documentation

## 2017-06-13 DIAGNOSIS — Z79899 Other long term (current) drug therapy: Secondary | ICD-10-CM | POA: Insufficient documentation

## 2017-06-13 DIAGNOSIS — Z886 Allergy status to analgesic agent status: Secondary | ICD-10-CM | POA: Insufficient documentation

## 2017-06-13 DIAGNOSIS — Z08 Encounter for follow-up examination after completed treatment for malignant neoplasm: Secondary | ICD-10-CM | POA: Insufficient documentation

## 2017-06-13 DIAGNOSIS — Z8541 Personal history of malignant neoplasm of cervix uteri: Secondary | ICD-10-CM | POA: Insufficient documentation

## 2017-06-13 DIAGNOSIS — K862 Cyst of pancreas: Secondary | ICD-10-CM | POA: Insufficient documentation

## 2017-06-20 ENCOUNTER — Encounter: Payer: Self-pay | Admitting: Radiation Oncology

## 2017-06-20 ENCOUNTER — Ambulatory Visit
Admission: RE | Admit: 2017-06-20 | Discharge: 2017-06-20 | Disposition: A | Discharge status: Home / Self Care | Payer: No Typology Code available for payment source | Source: Ambulatory Visit | Attending: Radiation Oncology | Admitting: Radiation Oncology

## 2017-06-20 ENCOUNTER — Other Ambulatory Visit: Payer: Self-pay

## 2017-06-20 VITALS — BP 139/92 | HR 85 | Temp 97.6°F | Ht 67.0 in | Wt 129.6 lb

## 2017-06-20 DIAGNOSIS — Z08 Encounter for follow-up examination after completed treatment for malignant neoplasm: Secondary | ICD-10-CM | POA: Diagnosis not present

## 2017-06-20 DIAGNOSIS — Z79899 Other long term (current) drug therapy: Secondary | ICD-10-CM | POA: Diagnosis not present

## 2017-06-20 DIAGNOSIS — C539 Malignant neoplasm of cervix uteri, unspecified: Secondary | ICD-10-CM

## 2017-06-20 DIAGNOSIS — Z8541 Personal history of malignant neoplasm of cervix uteri: Secondary | ICD-10-CM | POA: Diagnosis not present

## 2017-06-20 DIAGNOSIS — Z886 Allergy status to analgesic agent status: Secondary | ICD-10-CM | POA: Diagnosis not present

## 2017-06-20 DIAGNOSIS — Z880 Allergy status to penicillin: Secondary | ICD-10-CM | POA: Diagnosis not present

## 2017-06-20 DIAGNOSIS — K862 Cyst of pancreas: Secondary | ICD-10-CM | POA: Diagnosis not present

## 2017-06-20 NOTE — Progress Notes (Signed)
Radiation Oncology         [phone] ________________________________  Name: Martha Koch MRN: [id number]  Date: 06/20/2017  DOB: 04/26/1956  Follow-Up Visit Note  CC: [name], MD  [name], MD    ICD-10-CM   1. Cervical cancer, FIGO stage IB1 (HCC) C53.9     Diagnosis:   61 y.o. 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  She recently had a screening mammogram done on 03/28/2017 that showed no mammographic evidence of malignancy. She denies any pain, bloating, urinary/bowel issues, or vaginal bleeding/discharge. She reports having a good appetite but is fatigued from her job. She is not using a vaginal dilator.                              ALLERGIES:  is allergic to aspirin and penicillins.  Meds: Current Outpatient Medications  Medication Sig Dispense Refill  . amLODipine (NORVASC) 10 MG tablet Take 1 tablet (10 mg total) daily by mouth. 90 tablet 3  . Ascorbic Acid (VITAMIN C) 1000 MG tablet Take 1,000 mg by mouth daily. Reported on 08/26/2015    . cholecalciferol (VITAMIN D) 1000 units tablet Take 1,000 Units by mouth daily.    Marland Kitchen albuterol (PROVENTIL HFA;VENTOLIN HFA) 108 (90 Base) MCG/ACT inhaler Inhale 2 puffs into the lungs every 6 (six) hours as needed for wheezing or shortness of breath. (Patient not taking: Reported on 05/09/2017) 1 Inhaler 6  . atorvastatin (LIPITOR) 20 MG tablet Take 1 tablet (20 mg total) by mouth daily. (Patient not taking: Reported on 06/20/2017) 90 tablet 2   No current facility-administered medications for  this encounter.    Review of Systems: REVIEW OF SYSTEMS: A 10+ POINT REVIEW OF SYSTEMS WAS OBTAINED including neurology, dermatology, psychiatry, cardiac, respiratory, lymph, extremities, GI, GU, musculoskeletal, constitutional, reproductive, HEENT. All pertinent positives are noted in the HPI. All others are negative.  Physical Findings: The patient is in no acute distress. Patient is alert and oriented.  height is 5\' 7"  (1.702 m) and weight is 129 lb 9.6 oz (58.8 kg). Her oral temperature is 97.6 F (36.4 C). Her blood pressure is 139/92 (abnormal) and her pulse is 85. Her oxygen saturation is 100%.   Lungs are clear to auscultation bilaterally. Heart has regular rate and rhythm. No palpable cervical, supraclavicular, or axillary adenopathy. Abdomen soft, non-tender, normal bowel sounds. On abdominal exam, there was a horizontal scar which shows some mild hyperpigmentation changes. No palpable mass noted along the abdominal wall. No inguinal adenopathy.   On pelvic examination the external genitalia were unremarkable. A speculum exam was performed. There are no mucosal lesions noted in the vaginal vault. On bimanual and rectovaginal examination there were no pelvic masses appreciated. Did not perform Pap smear in light of Dr. Leone Brand recent Pap smear.  Lab Findings: Lab Results  Component Value Date   WBC 3.1 (L) 11/18/2014   HGB 12.8 11/18/2014   HCT 38.9 11/18/2014   MCV 82.4 11/18/2014   PLT 255 11/18/2014    Radiographic Findings: No results found.  Impression:  No evidence of disease recurrence on clinical exam.   Plan:  The patient will return to radiation oncology in 6 months with a repeat CT scan to be performed soon after as recommended by radiology for follow up of pancreatic cyst.    ____________________________________  Blair Promise, PhD, MD  This document serves as a record of services personally performed by Gery Pray, MD. It was created on his behalf by  Rae Lips, a trained medical scribe. The creation of this record is based on the scribe's personal observations and the provider's statements to them. This document has been checked and approved by the attending provider.

## 2017-06-20 NOTE — Progress Notes (Signed)
Martha Koch is here for follow up.  She denies pain, bloating, urinary/bowel issues or vaginal bleeding/discharge.  She reports having a good appetite but is fatigued which she says is from her job.  She is not using a vaginal dilator.  BP (!) 139/92 (BP Location: Right Arm, Patient Position: Sitting)   Pulse 85   Temp 97.6 F (36.4 C) (Oral)   Ht 5\' 7"  (1.702 m)   Wt 129 lb 9.6 oz (58.8 kg)   SpO2 100%   BMI 20.30 kg/m    Wt Readings from Last 3 Encounters:  06/20/17 129 lb 9.6 oz (58.8 kg)  05/09/17 131 lb 3.2 oz (59.5 kg)  02/08/17 129 lb (58.5 kg)

## 2017-12-19 ENCOUNTER — Ambulatory Visit: Payer: Self-pay | Admitting: Radiation Oncology

## 2017-12-26 ENCOUNTER — Other Ambulatory Visit (HOSPITAL_COMMUNITY)
Admission: RE | Admit: 2017-12-26 | Discharge: 2017-12-26 | Disposition: A | Discharge status: Home / Self Care | Payer: Self-pay | Source: Ambulatory Visit | Attending: Radiation Oncology | Admitting: Radiation Oncology

## 2017-12-26 ENCOUNTER — Ambulatory Visit
Admission: RE | Admit: 2017-12-26 | Discharge: 2017-12-26 | Disposition: A | Discharge status: Home / Self Care | Payer: No Typology Code available for payment source | Source: Ambulatory Visit | Attending: Radiation Oncology | Admitting: Radiation Oncology

## 2017-12-26 ENCOUNTER — Other Ambulatory Visit: Payer: Self-pay

## 2017-12-26 ENCOUNTER — Encounter: Payer: Self-pay | Admitting: Radiation Oncology

## 2017-12-26 VITALS — BP 145/88 | HR 81 | Temp 98.0°F | Resp 20 | Wt 127.8 lb

## 2017-12-26 DIAGNOSIS — Z8541 Personal history of malignant neoplasm of cervix uteri: Secondary | ICD-10-CM | POA: Insufficient documentation

## 2017-12-26 DIAGNOSIS — Z08 Encounter for follow-up examination after completed treatment for malignant neoplasm: Secondary | ICD-10-CM | POA: Insufficient documentation

## 2017-12-26 DIAGNOSIS — C539 Malignant neoplasm of cervix uteri, unspecified: Secondary | ICD-10-CM

## 2017-12-26 DIAGNOSIS — Z90710 Acquired absence of both cervix and uterus: Secondary | ICD-10-CM | POA: Insufficient documentation

## 2017-12-26 DIAGNOSIS — Z923 Personal history of irradiation: Secondary | ICD-10-CM | POA: Insufficient documentation

## 2017-12-26 NOTE — Progress Notes (Signed)
Radiation Oncology         [phone]) [phone] ________________________________  Name: Martha Koch MRN: [id number]  Date: 12/26/2017  DOB: 1956/02/17  Follow-Up Visit Note  CC: [name], MD  [name], MD    ICD-10-CM   1. Cervical cancer, FIGO stage IB1 (HCC) C53.9 Cytology - PAP  2. Recurrent cervical cancer (HCC) C53.9     Diagnosis:   62 y.o. 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 She has been doing well overall. 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 She denies recent medication changes. She has been released from Dr. Skeet Latch.   On review of systems, she denies vaginal bleeding, abdominal bloating, abdominal pain, abdominal cramping, decreased appetite, and any other symptoms. Pertinent positives are listed and detailed within the above HPI.                               ALLERGIES:  is allergic to aspirin and penicillins.  Meds: Current Outpatient Medications  Medication Sig Dispense Refill  . amLODipine (NORVASC) 10 MG tablet Take 1 tablet (10 mg total) daily by mouth. 90 tablet 3  . Ascorbic Acid (VITAMIN C) 1000 MG tablet Take 1,000 mg by mouth daily. Reported on 08/26/2015    . cholecalciferol (VITAMIN D) 1000 units tablet Take 1,000 Units by mouth daily.    Marland Kitchen ibuprofen (ADVIL,MOTRIN) 600 MG tablet Take by mouth.    Marland Kitchen albuterol (PROVENTIL HFA;VENTOLIN HFA) 108 (90 Base) MCG/ACT inhaler Inhale 2 puffs into the lungs every 6 (six) hours as needed for wheezing or shortness of breath. (Patient not taking: Reported on  05/09/2017) 1 Inhaler 6  . atorvastatin (LIPITOR) 20 MG tablet Take 1 tablet (20 mg total) by mouth daily. (Patient not taking: Reported on 06/20/2017) 90 tablet 2   No current facility-administered medications for this encounter.    Review of Systems: REVIEW OF SYSTEMS: A 10+ POINT REVIEW OF SYSTEMS WAS OBTAINED including neurology, dermatology, psychiatry, cardiac, respiratory, lymph, extremities, GI, GU, musculoskeletal, constitutional, reproductive, HEENT. All pertinent positives are noted in the HPI. All others are negative.  Physical Findings:  The patient is in no acute distress. Patient is alert and oriented.  weight is 127 lb 12.8 oz (58 kg). Her oral temperature is 98 F (36.7 C). Her blood pressure is 145/88 (abnormal) and her pulse is 81. Her respiration is 20 and oxygen saturation is 100%.   Lungs are clear to auscultation bilaterally. Heart has regular rate and rhythm. No palpable cervical, supraclavicular, or axillary adenopathy. Abdomen soft, non-tender, normal bowel sounds. On abdominal exam, there was a horizontal scar which shows some mild hyperpigmentation changes. No palpable mass noted along the abdominal wall. No inguinal adenopathy.   On pelvic examination the external genitalia were unremarkable. A speculum exam was performed. There are no mucosal lesions noted in the vaginal vault. On bimanual and rectovaginal examination there were no pelvic masses appreciated. Pap smear was performed today of the proximal vagina.   Lab Findings: Lab Results  Component Value Date  WBC 3.1 (L) 11/18/2014   HGB 12.8 11/18/2014   HCT 38.9 11/18/2014   MCV 82.4 11/18/2014   PLT 255 11/18/2014    Radiographic Findings: No results found.  Impression:  No evidence of recurrence on clinical exam, pap smear pending.   Plan:  The patient will return for routine follow up in 6 months.    ____________________________________  Blair Promise, PhD, MD  This document serves as a  record of services personally performed by Gery Pray, MD. It was created on his behalf by [facility], a trained medical scribe. The creation of this record is based on the scribe's personal observations and the provider's statements to them. This document has been checked and approved by the attending provider.

## 2017-12-26 NOTE — Progress Notes (Signed)
Pt is here today for follow up with Dr. Sondra Come. Pt denies pain. Pt denies c/o fatigue. Pt denies bloating, nausea/vomiting, diarrhea/constipation. Pt denies vaginal discharge or bleeding. Pt denies c/o dysuria, hematuria, rectal bleeding. Pt is unaccompanied today.   BP (!) 145/88 (BP Location: Left Arm, Patient Position: Sitting, Cuff Size: Normal)   Pulse 81   Temp 98 F (36.7 C) (Oral)   Resp 20   Wt 127 lb 12.8 oz (58 kg)   SpO2 100%   BMI 20.02 kg/m   Wt Readings from Last 3 Encounters:  12/26/17 127 lb 12.8 oz (58 kg)  06/20/17 129 lb 9.6 oz (58.8 kg)  05/09/17 131 lb 3.2 oz (59.5 kg)

## 2017-12-30 ENCOUNTER — Telehealth: Payer: Self-pay

## 2017-12-30 LAB — CYTOLOGY - PAP: Diagnosis: NEGATIVE

## 2017-12-30 NOTE — Telephone Encounter (Signed)
Left VM requesting return call. Awaiting call. Loma Sousa, RN BSN   Gery Pray, MD  Loma Sousa, RN        Sharee Pimple,    Please inform patient of good results on recent Pap smear. Thanks, jk

## 2018-02-12 ENCOUNTER — Other Ambulatory Visit: Payer: Self-pay

## 2018-02-12 DIAGNOSIS — I10 Essential (primary) hypertension: Secondary | ICD-10-CM

## 2018-04-21 ENCOUNTER — Encounter: Payer: Self-pay | Admitting: Family Medicine

## 2018-04-21 ENCOUNTER — Other Ambulatory Visit: Payer: Self-pay

## 2018-04-21 ENCOUNTER — Ambulatory Visit: Payer: Self-pay | Attending: Family Medicine | Admitting: Family Medicine

## 2018-04-21 VITALS — BP 165/96 | HR 89 | Temp 98.2°F | Resp 18 | Ht 67.0 in | Wt 131.6 lb

## 2018-04-21 DIAGNOSIS — F17200 Nicotine dependence, unspecified, uncomplicated: Secondary | ICD-10-CM

## 2018-04-21 DIAGNOSIS — J432 Centrilobular emphysema: Secondary | ICD-10-CM

## 2018-04-21 DIAGNOSIS — I10 Essential (primary) hypertension: Secondary | ICD-10-CM

## 2018-04-21 DIAGNOSIS — E785 Hyperlipidemia, unspecified: Secondary | ICD-10-CM

## 2018-04-21 DIAGNOSIS — Z79899 Other long term (current) drug therapy: Secondary | ICD-10-CM

## 2018-04-21 DIAGNOSIS — Z8541 Personal history of malignant neoplasm of cervix uteri: Secondary | ICD-10-CM

## 2018-04-21 DIAGNOSIS — F17210 Nicotine dependence, cigarettes, uncomplicated: Secondary | ICD-10-CM | POA: Insufficient documentation

## 2018-04-21 MED ORDER — AMLODIPINE BESYLATE 10 MG PO TABS: 10.0000 mg | ORAL_TABLET | Freq: Every day | ORAL | 6 refills | Status: DC

## 2018-04-21 MED ORDER — AMLODIPINE BESYLATE 10 MG PO TABS: 10.0000 mg | ORAL_TABLET | Freq: Every day | ORAL | 1 refills | Status: DC

## 2018-04-21 MED ORDER — VARENICLINE TARTRATE 1 MG PO TABS: 1.0000 mg | ORAL_TABLET | Freq: Two times a day (BID) | ORAL | 3 refills | Status: DC

## 2018-04-21 MED ORDER — VARENICLINE TARTRATE 0.5 MG PO TABS: 0.5000 mg | ORAL_TABLET | Freq: Two times a day (BID) | ORAL | 1 refills | Status: DC

## 2018-04-21 MED ORDER — ATORVASTATIN CALCIUM 20 MG PO TABS: 20.0000 mg | ORAL_TABLET | Freq: Every day | ORAL | 1 refills | Status: DC

## 2018-04-21 MED FILL — ATORVASTATIN 20 MG TABLET: 20 | 30 days supply | Qty: 30 | Fill #0

## 2018-04-21 MED FILL — AMLODIPINE BESYLATE 10 MG T: 10 | 30 days supply | Qty: 30 | Fill #0

## 2018-04-21 NOTE — Patient Instructions (Signed)

## 2018-04-21 NOTE — Progress Notes (Signed)
Patient stated she has been out of medications for about 5 months.   Patient received flu shot through work.   Requested refill on Amlodipine.   Patient requested for Vitamin D to take once a week.

## 2018-04-21 NOTE — Progress Notes (Signed)
Subjective:    Patient ID: Martha Koch, female    DOB: 01-17-1956, 62 y.o.   MRN: [id number]  HPI 62 year old female who was last seen in this office on 05/09/2017 to reestablish care and follow-up of chronic medical conditions including hypertension, COPD and patient with history of cervical cancer.  Patient's blood pressure on the day of her visit was 161/94.  Patient additionally with a history of hyperlipidemia.  Patient has been followed by Laura regarding her cervical cancer.      At today's visit, patient reports that she has been out of her blood pressure medication for most of the year.  Patient is also out of her cholesterol medications.  Patient has had some mild headaches while out of her blood pressure medication.  Patient has had no episodes of focal numbness or weakness.  No syncopal episodes.  Patient does continue to smoke about half a pack of cigarettes per day.  Patient denies a prior diagnosis of COPD.  Patient states that she was prescribed an inhaler once in the past but does not feel that she needs this currently.  Patient denies any current issues with shortness of breath or cough.       Patient reports that she is single and currently works as a Training and development officer at [employer].  Patient reports a family history of her father having hypertension and patient states that heart daughter had breast cancer about 2 years ago at age 51.  Patient reports that her mammogram is up-to-date.  Patient has had a colonoscopy but believes that it may have been more than 10 years ago. (Per chart, patient with last colonoscopy 10/31/2009 which was normal and needs repeat in 10 years).   Past Medical History:  Diagnosis Date  . Cancer (HCC) cervical   . Hypertension   . Radiation 06/02/14-07/13/14   pelvis 45 gray, vaginal cuff/parametrial boost to 50.4 gray  . Radiation 04/06/15-05/13/15   anterior abdominal wall 50.4 gray   Past Surgical History:  Procedure Laterality Date    . ABDOMINAL HYSTERECTOMY    . robotic radical hysterectomy, bso, pelvic lymphadenectomy Bilateral 04/21/14  . TUBAL LIGATION     Family History  Problem Relation Age of Onset  . Hypertension Father   . Cancer Other   . Diabetes Other   . Cancer Maternal Aunt        breast cancer, x 4 aunts   . Breast cancer Daughter    Social History   Tobacco Use  . Smoking status: Current Every Day Smoker    Packs/day: 0.50    Years: 30.00    Pack years: 15.00    Types: Cigarettes  . Smokeless tobacco: Never Used  . Tobacco comment: smokes 2-3 a day  Substance Use Topics  . Alcohol use: No    Alcohol/week: 0.0 standard drinks  . Drug use: No   Allergies  Allergen Reactions  . Aspirin     REACTION: stomach  . Penicillins     REACTION: hives        Review of Systems  Constitutional: Negative for chills, diaphoresis, fatigue and fever.  HENT: Negative for congestion, sore throat and trouble swallowing.   Respiratory: Negative for cough and shortness of breath.   Cardiovascular: Negative for chest pain, palpitations and leg swelling.  Gastrointestinal: Positive for blood in stool. Negative for abdominal pain, constipation, diarrhea and nausea.  Musculoskeletal: Negative for arthralgias, back pain, gait problem, joint swelling and myalgias.  Neurological: Positive  for headaches. Negative for dizziness, syncope, weakness, light-headedness and numbness.  Hematological: Negative for adenopathy. Does not bruise/bleed easily.       Objective:   Physical Exam BP (!) 165/96   Pulse 89   Temp 98.2 F (36.8 C) (Oral)   Resp 18   Ht 5\' 7"  (1.702 m)   Wt 131 lb 9.6 oz (59.7 kg)   SpO2 98%   BMI 20.61 kg/m  Nurse's notes and vital signs reviewed General-well-nourished, well-developed thin framed older female in no acute distress Neck-supple, no lymphadenopathy, no thyromegaly, no carotid bruit Lungs-clear to auscultation bilaterally Cardiovascular-regular rate and rhythm Abdomen-  patient with a mid abdominal healed surgical scar from the mid abdomen across the left midabdomen to the flank.  Patient has a bulge but no hernia in the right mid abdomen at the edge of the surgical scar.  Abdomen otherwise is nontender Back-no CVA tenderness Extremities-no edema      Assessment & Plan:  1. Essential hypertension Patient's blood pressure is elevated at today's visit but she has been out of her medication for some time.  Patient is encouraged to restart amlodipine 10 mg daily.  New prescription provided.  Patient is encouraged to remain compliant with the use of her blood pressure medication daily as well as a diet rich in fresh fruits and vegetables. - Comprehensive metabolic panel - Lipid panel - amLODipine (NORVASC) 10 MG tablet; Take 1 tablet (10 mg total) by mouth daily.  Dispense: 30 tablet; Refill: 6  2. Dyslipidemia Patient with history of dyslipidemia for which she has been on atorvastatin.  Patient will have lipid panel at today's visit as she states she is fasting.  Patient will also have CMP to make sure that her liver enzymes are within normal.  Patient given new prescription to restart atorvastatin. - Comprehensive metabolic panel - Lipid panel - atorvastatin (LIPITOR) 20 MG tablet; Take 1 tablet (20 mg total) by mouth daily.  Dispense: 90 tablet; Refill: 1  3. Tobacco dependence Discussed smoking cessation with the patient and she agrees to try Chantix.  Patient states that she was prescribed this medication in the past but never started it.  Patient is given 1 month starter pack along with continuing refill packs.  Patient is also made aware that she may smoke for up to 2 to 4 weeks prior to quit date while taking the Chantix.  Discussed possible side effects of nausea, abnormal dreams but if patient develops any depression/anxiety or suicidal thoughts/ideation she needs to stop the medication and return for medical follow-up - varenicline (CHANTIX) 0.5 MG  tablet; Take 1 tablet (0.5 mg total) by mouth 2 (two) times daily. For 30 days  Dispense: 60 tablet; Refill: 1 - varenicline (CHANTIX CONTINUING MONTH PAK) 1 MG tablet; Take 1 tablet (1 mg total) by mouth 2 (two) times daily.  Dispense: 60 tablet; Refill: 3  4. Centrilobular emphysema ([place]) On review of patient's chart, patient with centrilobular and paraseptal emphysema seen on CT scan done 09/12/2016.  Discussed with the patient that this is a type of COPD.  Patient was given information on COPD as part of her AVS.  5. History of cervical cancer in adulthood Patient with a history of cervical cancer status post hysterectomy and radiation therapy.  Patient states that she will have follow-up again in December for surveillance.  6. Encounter for long-term (current) use of medications Patient will have CMP to check electrolytes, renal function and liver function in follow-up of her use  of amlodipine and atorvastatin in the past.  Patient is being restarted on these medications at today's visit. - Comprehensive metabolic panel  *Patient reports that she already received influenza immunization at her workplace  An After Visit Summary was printed and given to the patient.   Return in about 4 months (around 08/22/2018) for HTN/smoking. . - Lipid panel

## 2018-04-22 LAB — COMPREHENSIVE METABOLIC PANEL WITH GFR
ALT: 9 IU/L (ref 0–32)
AST: 14 IU/L (ref 0–40)
Albumin/Globulin Ratio: 2 (ref 1.2–2.2)
Albumin: 4.7 g/dL (ref 3.6–4.8)
Alkaline Phosphatase: 71 IU/L (ref 39–117)
BUN/Creatinine Ratio: 14 (ref 12–28)
BUN: 12 mg/dL (ref 8–27)
Bilirubin Total: 0.3 mg/dL (ref 0.0–1.2)
CO2: 25 mmol/L (ref 20–29)
Calcium: 9.6 mg/dL (ref 8.7–10.3)
Chloride: 104 mmol/L (ref 96–106)
Creatinine, Ser: 0.86 mg/dL (ref 0.57–1.00)
GFR calc Af Amer: 83 mL/min/1.73
GFR calc non Af Amer: 72 mL/min/1.73
Globulin, Total: 2.4 g/dL (ref 1.5–4.5)
Glucose: 86 mg/dL (ref 65–99)
Potassium: 4.9 mmol/L (ref 3.5–5.2)
Sodium: 143 mmol/L (ref 134–144)
Total Protein: 7.1 g/dL (ref 6.0–8.5)

## 2018-04-22 LAB — LIPID PANEL
Chol/HDL Ratio: 2.1 ratio (ref 0.0–4.4)
Cholesterol, Total: 189 mg/dL (ref 100–199)
HDL: 89 mg/dL
LDL Calculated: 91 mg/dL (ref 0–99)
Triglycerides: 46 mg/dL (ref 0–149)
VLDL Cholesterol Cal: 9 mg/dL (ref 5–40)

## 2018-04-23 ENCOUNTER — Telehealth: Payer: Self-pay

## 2018-04-23 NOTE — Telephone Encounter (Signed)
-----   Message from Antony Blackbird, MD sent at 04/22/2018 12:30 PM EDT ----- Please notify patient of normal CMP and normal lipid panel with LDL of 91-continue cholesterol medication at this time if not having any issues with the medication

## 2018-04-23 NOTE — Telephone Encounter (Signed)
Patient was called, answered, verified dob, and was given most recent lab results. Patient verbalized understanding and had no further questions.

## 2018-06-30 ENCOUNTER — Telehealth: Payer: Self-pay | Admitting: *Deleted

## 2018-06-30 ENCOUNTER — Telehealth: Payer: Self-pay | Admitting: Radiation Oncology

## 2018-06-30 ENCOUNTER — Ambulatory Visit
Admission: RE | Admit: 2018-06-30 | Discharge: 2018-06-30 | Disposition: A | Discharge status: Home / Self Care | Payer: Self-pay | Source: Ambulatory Visit | Attending: Radiation Oncology | Admitting: Radiation Oncology

## 2018-06-30 NOTE — Telephone Encounter (Signed)
CALLED PATIENT'S DAUGHTER TO INFORM OF FU APPT. BEING MOVED TO 07-14-18 @ 3:45 PM, SPOKE WITH PATIENT'S DAUGHTER AND SHE CONVEYED THAT SHE WILL INFORM HER MOM

## 2018-06-30 NOTE — Telephone Encounter (Signed)
CALLED PATIENT TO ALTER FU DUE TO DR. KINARD RUNNING LATE IN THE OR, LVM FOR A RETURN CALL

## 2018-06-30 NOTE — Telephone Encounter (Signed)
Martha Koch just called to change her appt from today to next Monday, 07/07/2018 at 4:30p. I have let Dr. Sondra Come and nurse Sharee Pimple know.

## 2018-07-07 ENCOUNTER — Ambulatory Visit
Admission: RE | Admit: 2018-07-07 | Discharge: 2018-07-07 | Disposition: A | Discharge status: Home / Self Care | Payer: Self-pay | Source: Ambulatory Visit | Attending: Radiation Oncology | Admitting: Radiation Oncology

## 2018-07-07 ENCOUNTER — Encounter: Payer: Self-pay | Admitting: Radiation Oncology

## 2018-07-07 ENCOUNTER — Ambulatory Visit: Payer: Self-pay | Admitting: Radiation Oncology

## 2018-07-07 ENCOUNTER — Other Ambulatory Visit: Payer: Self-pay

## 2018-07-07 VITALS — BP 157/87 | HR 86 | Temp 97.8°F | Resp 18 | Wt 125.4 lb

## 2018-07-07 DIAGNOSIS — Z79899 Other long term (current) drug therapy: Secondary | ICD-10-CM | POA: Insufficient documentation

## 2018-07-07 DIAGNOSIS — C539 Malignant neoplasm of cervix uteri, unspecified: Secondary | ICD-10-CM | POA: Insufficient documentation

## 2018-07-07 NOTE — Progress Notes (Signed)
Radiation Oncology         [phone] ________________________________  Name: Martha Koch MRN: [id number]  Date: 07/07/2018  DOB: 1955-11-13  Follow-Up Visit Note  CC: Antony Blackbird, MD  [name], MD    ICD-10-CM   1. Recurrent cervical cancer (HCC) C53.9   2. Cervical cancer, FIGO stage IB1 (HCC) C53.9     Diagnosis:   63 y.o. 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   She has been doing well overall. 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 She denies recent medication and health changes.   On review of systems, she denies vaginal bleeding, abdominal bloating, abdominal pain, abdominal cramping, decreased appetite, and any other symptoms. Pertinent positives are listed and detailed within the above HPI.                               ALLERGIES:  is allergic to aspirin and penicillins.  Meds: Current Outpatient Medications  Medication Sig Dispense Refill  . amLODipine (NORVASC) 10 MG tablet Take 1 tablet (10 mg total) by mouth daily. 30 tablet 6  . Ascorbic Acid (VITAMIN C) 1000 MG tablet Take 1,000 mg by mouth daily. Reported on 08/26/2015    . atorvastatin (LIPITOR) 20 MG tablet Take 1 tablet (20 mg total) by mouth daily. 90 tablet 1  . cholecalciferol (VITAMIN D) 1000 units tablet Take 1,000 Units by mouth daily.    Marland Kitchen ibuprofen (ADVIL,MOTRIN) 600 MG tablet Take by mouth.    Marland Kitchen albuterol (PROVENTIL HFA;VENTOLIN HFA) 108 (90 Base) MCG/ACT inhaler Inhale 2 puffs into the lungs every 6 (six) hours as needed for wheezing or  shortness of breath. (Patient not taking: Reported on 05/09/2017) 1 Inhaler 6  . varenicline (CHANTIX CONTINUING MONTH PAK) 1 MG tablet Take 1 tablet (1 mg total) by mouth 2 (two) times daily. (Patient not taking: Reported on 07/07/2018) 60 tablet 3  . varenicline (CHANTIX) 0.5 MG tablet Take 1 tablet (0.5 mg total) by mouth 2 (two) times daily. For 30 days (Patient not taking: Reported on 07/07/2018) 60 tablet 1   No current facility-administered medications for this encounter.    Review of Systems: REVIEW OF SYSTEMS: A 10+ POINT REVIEW OF SYSTEMS WAS OBTAINED including neurology, dermatology, psychiatry, cardiac, respiratory, lymph, extremities, GI, GU, musculoskeletal, constitutional, reproductive, HEENT. All pertinent positives are noted in the HPI. All others are negative.  Physical Findings:  The patient is in no acute distress. Patient is alert and oriented.  weight is 125 lb 6.4 oz (56.9 kg). Her oral temperature is 97.8 F (36.6 C). Her blood pressure is 157/87 (abnormal) and her pulse is 86. Her respiration is 18 and oxygen saturation is 100%.   Lungs are clear to auscultation bilaterally. Heart has regular rate and rhythm. No palpable cervical, supraclavicular, or axillary adenopathy. Abdomen soft, non-tender, normal bowel sounds. On abdominal exam, there was a horizontal scar which shows some mild hyperpigmentation changes. No palpable mass noted along the abdominal wall. No inguinal adenopathy.  On pelvic examination the external genitalia were unremarkable. A speculum exam was performed. There are no mucosal lesions noted in the vaginal vault. On bimanual and rectovaginal examination there were no pelvic masses appreciated.    Lab Findings: Lab Results  Component Value Date   WBC 3.1 (L) 11/18/2014   HGB 12.8 11/18/2014   HCT 38.9 11/18/2014   MCV 82.4 11/18/2014   PLT 255 11/18/2014    Radiographic Findings: No results found.  Impression:  No evidence of recurrence on clincal  exam.  Plan:  The patient will return for routine follow up in 6 months. The patient will need a pap smear on that follow-up visit.   ____________________________________  Blair Promise, PhD, MD  This document serves as a record of services personally performed by Gery Pray, MD. It was created on his behalf by Mary-Margaret Loma Messing, a trained medical scribe. The creation of this record is based on the scribe's personal observations and the provider's statements to them. This document has been checked and approved by the attending provider.

## 2018-07-07 NOTE — Progress Notes (Signed)
Pt presents today for f/u with Dr. Sondra Come. Pt denies c/o pain. Pt denies dysuria/hematuria. Pt denies vaginal bleeding/discharge. Pt denies rectal bleeding, diarrhea/constipation. Pt denies abdominal bloating, N/V.   BP (!) 157/87   Pulse 86   Temp 97.8 F (36.6 C) (Oral)   Resp 18   Wt 125 lb 6.4 oz (56.9 kg)   SpO2 100%   BMI 19.64 kg/m   Wt Readings from Last 3 Encounters:  07/07/18 125 lb 6.4 oz (56.9 kg)  04/21/18 131 lb 9.6 oz (59.7 kg)  12/26/17 127 lb 12.8 oz (58 kg)   Loma Sousa, RN BSN

## 2018-07-14 ENCOUNTER — Ambulatory Visit: Payer: Self-pay | Admitting: Radiation Oncology

## 2018-08-05 ENCOUNTER — Other Ambulatory Visit: Payer: Self-pay | Admitting: Family Medicine

## 2018-08-05 DIAGNOSIS — Z1231 Encounter for screening mammogram for malignant neoplasm of breast: Secondary | ICD-10-CM

## 2018-08-22 ENCOUNTER — Ambulatory Visit: Payer: Self-pay | Admitting: Family Medicine

## 2018-08-26 ENCOUNTER — Ambulatory Visit: Payer: No Typology Code available for payment source | Attending: Family Medicine | Admitting: Family Medicine

## 2018-08-26 ENCOUNTER — Encounter: Payer: Self-pay | Admitting: Family Medicine

## 2018-08-26 VITALS — BP 163/89 | HR 98 | Temp 97.8°F | Resp 18 | Ht 67.0 in | Wt 128.0 lb

## 2018-08-26 DIAGNOSIS — I10 Essential (primary) hypertension: Secondary | ICD-10-CM

## 2018-08-26 DIAGNOSIS — R5383 Other fatigue: Secondary | ICD-10-CM | POA: Diagnosis not present

## 2018-08-26 MED FILL — ATORVASTATIN 20 MG TABLET: 20 | 30 days supply | Qty: 30 | Fill #1

## 2018-08-26 NOTE — Progress Notes (Signed)
Subjective:    Patient ID: Martha Koch, female    DOB: 1956-05-12, 63 y.o.   MRN: [id number]  HPI       63 year old female who is seen in follow-up of hypertension.  Patient was last seen in the office on 04/21/2018 to reestablish care.  At that time, patient had been out of her blood pressure medication for almost a year prior to scheduling follow-up appointment.  At today's visit, patient again reports that she has run out of her amlodipine for control of her blood pressure.  Patient however states that she recently realized that she does have refills of her medication and will pick up refill at today's visit as she is already contacted the pharmacy.  Patient did feel that while she was on the medication she did have good control of her blood pressure.  Patient did not have any headaches or dizziness related to blood pressure while on the medication but now has some occasional, dull, generalized headaches.  Past Medical History:  Diagnosis Date  . Cancer (HCC) cervical   . Hypertension   . Radiation 06/02/14-07/13/14   pelvis 45 gray, vaginal cuff/parametrial boost to 50.4 gray  . Radiation 04/06/15-05/13/15   anterior abdominal wall 50.4 gray   Past Surgical History:  Procedure Laterality Date  . ABDOMINAL HYSTERECTOMY    . robotic radical hysterectomy, bso, pelvic lymphadenectomy Bilateral 04/21/14  . TUBAL LIGATION     Family History  Problem Relation Age of Onset  . Hypertension Father   . Cancer Other   . Diabetes Other   . Cancer Maternal Aunt        breast cancer, x 4 aunts   . Breast cancer Daughter    Social History   Tobacco Use  . Smoking status: Current Every Day Smoker    Packs/day: 0.50    Years: 30.00    Pack years: 15.00    Types: Cigarettes  . Smokeless tobacco: Never Used  . Tobacco comment: smokes 2-3 a day  Substance Use Topics  . Alcohol use: No    Alcohol/week: 0.0 standard drinks  . Drug use: No   Allergies  Allergen Reactions  . Aspirin       REACTION: stomach  . Penicillins     REACTION: hives     Review of Systems  Constitutional: Positive for fatigue. Negative for chills and fever.  Respiratory: Negative for cough and shortness of breath.   Cardiovascular: Negative for chest pain, palpitations and leg swelling.  Gastrointestinal: Negative for abdominal pain, constipation, diarrhea and nausea.  Endocrine: Negative for cold intolerance, heat intolerance, polydipsia, polyphagia and polyuria.  Genitourinary: Negative for dysuria and frequency.  Musculoskeletal: Negative for arthralgias and gait problem.  Neurological: Positive for headaches. Negative for dizziness.  Hematological: Negative for adenopathy. Does not bruise/bleed easily.       Objective:   Physical Exam Constitutional:      Appearance: Normal appearance. She is normal weight.  Neck:     Musculoskeletal: Normal range of motion and neck supple. No muscular tenderness.  Cardiovascular:     Rate and Rhythm: Normal rate and regular rhythm.  Pulmonary:     Effort: Pulmonary effort is normal.     Breath sounds: Normal breath sounds.  Abdominal:     General: Bowel sounds are normal.     Palpations: Abdomen is soft.     Tenderness: There is no abdominal tenderness. There is no right CVA tenderness or left CVA tenderness.  Musculoskeletal: Normal  range of motion.        General: No tenderness.     Right lower leg: No edema.     Left lower leg: No edema.  Lymphadenopathy:     Cervical: No cervical adenopathy.  Skin:    General: Skin is warm and dry.  Neurological:     General: No focal deficit present.     Mental Status: She is alert and oriented to person, place, and time.  Psychiatric:        Mood and Affect: Mood normal.        Behavior: Behavior normal.        Thought Content: Thought content normal.        Judgment: Judgment normal.    BP (!) 163/89 (BP Location: Left Arm, Patient Position: Sitting, Cuff Size: Normal)   Pulse 98   Temp 97.8  F (36.6 C) (Oral)   Resp 18   Ht 5\' 7"  (1.702 m)   Wt 128 lb (58.1 kg)   SpO2 100%   BMI 20.05 kg/m        Assessment & Plan:  1. Essential hypertension Patient reports that she has been out of her blood pressure medication for a few weeks because she did not realize that she could call to obtain refills on her current prescription but did recently call the pharmacy and patient will pick up her medication after today's visit.  Patient believes that her blood pressure was controlled while she was on the medication.  Blood pressure today's visit is elevated at 163/89 and patient is encouraged to return to clinic in about 2 weeks after restarting her medication.  Information on hypertension was also provided as part of patient's AVS-after visit summary.  Patient is also encouraged to continue a Dash diet and to start a regular, low impact cardiovascular exercise program such as walking.  Discussed working on decreasing tobacco use with goal of complete smoking cessation.  No labs at today's visit as prior complete metabolic panel was normal on 04/21/2018.

## 2018-08-26 NOTE — Patient Instructions (Addendum)

## 2018-08-26 NOTE — Progress Notes (Signed)
Patient has not taken BP medication in days.

## 2018-08-27 ENCOUNTER — Ambulatory Visit: Payer: No Typology Code available for payment source | Attending: Family Medicine | Admitting: Emergency Medicine

## 2018-08-27 DIAGNOSIS — Z111 Encounter for screening for respiratory tuberculosis: Secondary | ICD-10-CM | POA: Diagnosis not present

## 2018-08-27 NOTE — Progress Notes (Signed)
Patient arrived ambulatory, alert and orientated with a need for a TB test.  Tuberculin injected in left forearm.  Patient told to return on Friday at 1400 hrs.

## 2018-08-29 ENCOUNTER — Ambulatory Visit: Payer: Self-pay | Attending: Family Medicine | Admitting: *Deleted

## 2018-08-29 DIAGNOSIS — Z111 Encounter for screening for respiratory tuberculosis: Secondary | ICD-10-CM

## 2018-08-29 LAB — TB SKIN TEST: TB Skin Test: NEGATIVE

## 2018-08-29 NOTE — Progress Notes (Signed)
PPD Reading Note PPD read and results entered in [place]. Result: 0 mm induration. Interpretation: negative Allergic reaction: none

## 2018-09-01 ENCOUNTER — Telehealth: Payer: Self-pay | Admitting: Emergency Medicine

## 2018-09-01 ENCOUNTER — Other Ambulatory Visit: Payer: Self-pay | Admitting: Family Medicine

## 2018-09-01 ENCOUNTER — Ambulatory Visit
Admission: RE | Admit: 2018-09-01 | Discharge: 2018-09-01 | Disposition: A | Discharge status: Home / Self Care | Payer: No Typology Code available for payment source | Source: Ambulatory Visit | Attending: Family Medicine | Admitting: Family Medicine

## 2018-09-01 DIAGNOSIS — Z1231 Encounter for screening mammogram for malignant neoplasm of breast: Secondary | ICD-10-CM

## 2018-09-01 NOTE — Telephone Encounter (Signed)
Patients son called and advised that Dr. Chapman Fitch had put in a prescription for Lipitor.  Patients son also advised that her slightly elevated creatine would not keep her from going on a trip to [place].  Patient's son also advised to have his mother follow up with a Physic an in 4-6 weeks.

## 2018-09-13 ENCOUNTER — Encounter: Payer: Self-pay | Admitting: Family Medicine

## 2018-10-08 ENCOUNTER — Telehealth: Payer: Self-pay | Admitting: *Deleted

## 2018-10-08 NOTE — Telephone Encounter (Signed)
CALLED PATIENT TO INFORM OF 6 MONTH FU WITH DR. KINARD ON 01-05-19 @ 3 PM, LVM FOR A RETURN CALL

## 2018-11-03 ENCOUNTER — Emergency Department (HOSPITAL_COMMUNITY): Payer: No Typology Code available for payment source

## 2018-11-03 ENCOUNTER — Encounter (HOSPITAL_COMMUNITY): Payer: Self-pay

## 2018-11-03 ENCOUNTER — Other Ambulatory Visit: Payer: Self-pay

## 2018-11-03 ENCOUNTER — Emergency Department (HOSPITAL_COMMUNITY)
Admission: EM | Admit: 2018-11-03 | Discharge: 2018-11-03 | Disposition: A | Discharge status: Home / Self Care | Payer: No Typology Code available for payment source | Attending: Emergency Medicine | Admitting: Emergency Medicine

## 2018-11-03 DIAGNOSIS — Y9301 Activity, walking, marching and hiking: Secondary | ICD-10-CM | POA: Insufficient documentation

## 2018-11-03 DIAGNOSIS — I10 Essential (primary) hypertension: Secondary | ICD-10-CM | POA: Diagnosis not present

## 2018-11-03 DIAGNOSIS — Y990 Civilian activity done for income or pay: Secondary | ICD-10-CM | POA: Diagnosis not present

## 2018-11-03 DIAGNOSIS — J449 Chronic obstructive pulmonary disease, unspecified: Secondary | ICD-10-CM | POA: Diagnosis not present

## 2018-11-03 DIAGNOSIS — S069X1A Unspecified intracranial injury with loss of consciousness of 30 minutes or less, initial encounter: Secondary | ICD-10-CM | POA: Insufficient documentation

## 2018-11-03 DIAGNOSIS — Y92538 Other ambulatory health services establishments as the place of occurrence of the external cause: Secondary | ICD-10-CM | POA: Insufficient documentation

## 2018-11-03 DIAGNOSIS — W010XXA Fall on same level from slipping, tripping and stumbling without subsequent striking against object, initial encounter: Secondary | ICD-10-CM | POA: Insufficient documentation

## 2018-11-03 DIAGNOSIS — Z79899 Other long term (current) drug therapy: Secondary | ICD-10-CM | POA: Diagnosis not present

## 2018-11-03 DIAGNOSIS — F17210 Nicotine dependence, cigarettes, uncomplicated: Secondary | ICD-10-CM | POA: Diagnosis not present

## 2018-11-03 DIAGNOSIS — S060X1A Concussion with loss of consciousness of 30 minutes or less, initial encounter: Secondary | ICD-10-CM | POA: Diagnosis not present

## 2018-11-03 DIAGNOSIS — S0990XA Unspecified injury of head, initial encounter: Secondary | ICD-10-CM | POA: Diagnosis present

## 2018-11-03 MED ORDER — IBUPROFEN 600 MG PO TABS: 600.0000 mg | ORAL_TABLET | Freq: Four times a day (QID) | ORAL | 0 refills | Status: DC | PRN

## 2018-11-03 MED ORDER — ONDANSETRON 4 MG PO TBDP: 4.0000 mg | ORAL_TABLET | Freq: Three times a day (TID) | ORAL | 0 refills | Status: DC | PRN

## 2018-11-03 MED ORDER — MECLIZINE HCL 12.5 MG PO TABS: 12.5000 mg | ORAL_TABLET | Freq: Three times a day (TID) | ORAL | 0 refills | Status: DC | PRN

## 2018-11-03 MED ORDER — ACETAMINOPHEN 500 MG PO TABS
1000.0000 mg | ORAL_TABLET | Freq: Once | ORAL | Status: AC
  Administered 2018-11-03: 1000 mg via ORAL
  Filled 2018-11-03: qty 2

## 2018-11-03 MED ORDER — ONDANSETRON 4 MG PO TBDP
4.0000 mg | ORAL_TABLET | Freq: Once | ORAL | Status: AC
  Administered 2018-11-03: 4 mg via ORAL
  Filled 2018-11-03: qty 1

## 2018-11-03 MED ORDER — ONDANSETRON HCL 4 MG/2ML IJ SOLN: 4.0000 mg | Freq: Once | INTRAMUSCULAR | Status: DC

## 2018-11-03 NOTE — Discharge Instructions (Addendum)
As we discussed, I am concerned you have a CONCUSSION.  For your CONCUSSION:  - Stay out of work for the next 72 hours, then gradually return to normal activity - Use your headache and dizziness as a guide - if you're doing something and notice it causes worsening headache or symptoms, stop and rest - Avoid screens, reading, or any activity that causes you to strain your brain or exercise until symptom free  - Follow-up with your doctor this week - Take Tylenol or Ibuprofen (prescribed) for headache  As we also discussed, if you have a WORSENING headache or any NEW symptoms - weakness, numbness, VOMITING, return to the ER immediately

## 2018-11-03 NOTE — ED Notes (Signed)
RX X 3 GIVEN 

## 2018-11-03 NOTE — ED Notes (Signed)
PT ambulate to and from restroom. C/O dizziness

## 2018-11-03 NOTE — ED Provider Notes (Signed)
[facility] DEPT Provider Note   CSN: [id number] Arrival date & time: 11/03/18  0708    History   Chief Complaint Chief Complaint  Patient presents with   Fall   Headache    HPI Martha Koch is a 63 y.o. female.     HPI   63 yo F here with head injury. Pt works as a Training and development officer at [employer]. She was in the kitchen making breakfast this AM when she tripped over a small crate that was on the ground. Crate was apparently placed there to discourage tripping over an electronic outlet after a refrigerator was removed. She fell backwards, landing on her occiput. She thinks she may have briefly lost consciousness. Since then, she's developed a moderate aching, throbbing posterior headache. She has some mild dizziness upon sitting up as well. No neck pain. No arm or hand numbness, weakness, or paresthesias. She is not on blood thinners. No h/o prior head or neck injuries. Denies any other neck, back, or tailbone pain. Sx are constant, no specific worsening or alleviating factors.  Past Medical History:  Diagnosis Date   Cancer ([place]) cervical    Hypertension    Radiation 06/02/14-07/13/14   pelvis 45 gray, vaginal cuff/parametrial boost to 50.4 gray   Radiation 04/06/15-05/13/15   anterior abdominal wall 50.4 gray    Patient Active Problem List   Diagnosis Date Noted   History of hyperlipidemia 05/21/2017   Pancreatic cyst 09/18/2016   COPD (chronic obstructive pulmonary disease) ([place]) 04/23/2016   Mild airflow obstruction on pulmonary function test 03/18/2016   Abnormal PFT 03/18/2016   Diffusion capacity of lung (dl), decreased 03/18/2016   Abnormal CT of the chest 02/16/2016   Pulmonary nodules 02/16/2016   Recurrent cervical cancer ([place]) 03/04/2015   Abdominal wall mass of left upper quadrant 01/13/2015   Cervical cancer, FIGO stage IB1 ([place]) 11/20/2014   Continuous tobacco abuse 11/20/2014   Dense breast tissue 11/20/2014     Cervical cancer ([place]) 04/12/2014   Ovarian mass 04/12/2014   Loss of weight 03/02/2014   Hypertension 03/02/2014    Past Surgical History:  Procedure Laterality Date   ABDOMINAL HYSTERECTOMY     robotic radical hysterectomy, bso, pelvic lymphadenectomy Bilateral 04/21/14   TUBAL LIGATION       OB History    Gravida  3   Para  3   Term  3   Preterm      AB      Living  3     SAB      TAB      Ectopic      Multiple      Live Births               Home Medications    Prior to Admission medications   Medication Sig Start Date End Date Taking? Authorizing Provider  albuterol (PROVENTIL HFA;VENTOLIN HFA) 108 (90 Base) MCG/ACT inhaler Inhale 2 puffs into the lungs every 6 (six) hours as needed for wheezing or shortness of breath. Patient not taking: Reported on 05/09/2017 09/27/16   Collene Gobble, MD  amLODipine (NORVASC) 10 MG tablet Take 1 tablet (10 mg total) by mouth daily. 04/21/18   Fulp, Cammie, MD  Ascorbic Acid (VITAMIN C) 1000 MG tablet Take 1,000 mg by mouth daily. Reported on 08/26/2015    [provider]  atorvastatin (LIPITOR) 20 MG tablet Take 1 tablet (20 mg total) by mouth daily. 04/21/18   Antony Blackbird, MD  cholecalciferol (VITAMIN D) 1000 units tablet Take 1,000 Units by mouth daily.    [provider]  ibuprofen (ADVIL) 600 MG tablet Take 1 tablet (600 mg total) by mouth every 6 (six) hours as needed for headache. 11/03/18   Duffy Bruce, MD  meclizine (ANTIVERT) 12.5 MG tablet Take 1 tablet (12.5 mg total) by mouth 3 (three) times daily as needed for dizziness. 11/03/18   Duffy Bruce, MD  ondansetron (ZOFRAN ODT) 4 MG disintegrating tablet Take 1 tablet (4 mg total) by mouth every 8 (eight) hours as needed for nausea or vomiting. 11/03/18   Duffy Bruce, MD    Family History Family History  Problem Relation Age of Onset   Hypertension Father    Cancer Other    Diabetes Other    Cancer Maternal Aunt         breast cancer, x 4 aunts    Breast cancer Daughter     Social History Social History   Tobacco Use   Smoking status: Current Every Day Smoker    Packs/day: 0.50    Years: 30.00    Pack years: 15.00    Types: Cigarettes   Smokeless tobacco: Never Used   Tobacco comment: smokes 2-3 a day  Substance Use Topics   Alcohol use: No    Alcohol/week: 0.0 standard drinks   Drug use: No     Allergies   Aspirin and Penicillins   Review of Systems Review of Systems  Constitutional: Negative for chills and fever.  HENT: Negative for congestion, rhinorrhea and sore throat.   Eyes: Negative for visual disturbance.  Respiratory: Negative for cough, shortness of breath and wheezing.   Cardiovascular: Negative for chest pain and leg swelling.  Gastrointestinal: Negative for abdominal pain, diarrhea, nausea and vomiting.  Genitourinary: Negative for dysuria, flank pain, vaginal bleeding and vaginal discharge.  Musculoskeletal: Negative for neck pain.  Skin: Negative for rash.  Allergic/Immunologic: Negative for immunocompromised state.  Neurological: Positive for dizziness and headaches. Negative for syncope.  Hematological: Does not bruise/bleed easily.  All other systems reviewed and are negative.    Physical Exam Updated Vital Signs BP 136/86 (BP Location: Left Arm)    Pulse 88    Temp 97.6 F (36.4 C) (Oral)    Resp 15    Ht 5\' 7"  (1.702 m)    Wt 59 kg    SpO2 98%    BMI 20.36 kg/m   Physical Exam Vitals signs and nursing note reviewed.  Constitutional:      General: She is not in acute distress.    Appearance: She is well-developed.  HENT:     Head: Normocephalic and atraumatic.     Comments: Mild TTP over occiput. No deformity. No midline C-Spine TTP. No hemotympanum or periorbital ecchymoses. Negative Battle sign. Eyes:     Conjunctiva/sclera: Conjunctivae normal.  Neck:     Musculoskeletal: Neck supple.  Cardiovascular:     Rate and Rhythm: Normal rate and  regular rhythm.     Heart sounds: Normal heart sounds. No murmur. No friction rub.  Pulmonary:     Effort: Pulmonary effort is normal. No respiratory distress.     Breath sounds: Normal breath sounds. No wheezing or rales.  Abdominal:     General: There is no distension.     Palpations: Abdomen is soft.     Tenderness: There is no abdominal tenderness.  Skin:    General: Skin is warm.     Capillary Refill: Capillary refill takes  less than 2 seconds.  Neurological:     Mental Status: She is alert and oriented to person, place, and time.     Motor: No abnormal muscle tone.      ED Treatments / Results  Labs (all labs ordered are listed, but only abnormal results are displayed) Labs Reviewed - No data to display  EKG EKG Interpretation  Date/Time:  Monday Nov 03 2018 07:58:49 EDT Ventricular Rate:  80 PR Interval:    QRS Duration: 91 QT Interval:  396 QTC Calculation: 457 R Axis:   82 Text Interpretation:  Sinus rhythm Biatrial enlargement Borderline right axis deviation No old tracing to compare No apparent ischemic changes or arrhythmia Confirmed by Duffy Bruce [phone]) on 11/03/2018 8:00:24 AM   Radiology Ct Head Wo Contrast  Result Date: 11/03/2018 CLINICAL DATA:  Altered level of consciousness, unexplained EXAM: CT HEAD WITHOUT CONTRAST CT CERVICAL SPINE WITHOUT CONTRAST TECHNIQUE: Multidetector CT imaging of the head and cervical spine was performed following the standard protocol without intravenous contrast. Multiplanar CT image reconstructions of the cervical spine were also generated. COMPARISON:  None. FINDINGS: CT HEAD FINDINGS Brain: No evidence of acute infarction, hemorrhage, hydrocephalus, extra-axial collection or mass lesion/mass effect. Vascular: No hyperdense vessel or unexpected calcification. Skull: Negative for fracture Sinuses/Orbits: No evidence of injury CT CERVICAL SPINE FINDINGS Alignment: Degenerative reversal of cervical lordosis. Skull base and  vertebrae: Negative for acute fracture Soft tissues and spinal canal: No prevertebral fluid or swelling. No visible canal hematoma. Disc levels:  C4-5 and C5-6 disc degeneration. Upper chest: Negative IMPRESSION: No evidence of intracranial or cervical spine injury. Electronically Signed   By: Monte Fantasia M.D.   On: 11/03/2018 08:40   Ct Cervical Spine Wo Contrast  Result Date: 11/03/2018 CLINICAL DATA:  Altered level of consciousness, unexplained EXAM: CT HEAD WITHOUT CONTRAST CT CERVICAL SPINE WITHOUT CONTRAST TECHNIQUE: Multidetector CT imaging of the head and cervical spine was performed following the standard protocol without intravenous contrast. Multiplanar CT image reconstructions of the cervical spine were also generated. COMPARISON:  None. FINDINGS: CT HEAD FINDINGS Brain: No evidence of acute infarction, hemorrhage, hydrocephalus, extra-axial collection or mass lesion/mass effect. Vascular: No hyperdense vessel or unexpected calcification. Skull: Negative for fracture Sinuses/Orbits: No evidence of injury CT CERVICAL SPINE FINDINGS Alignment: Degenerative reversal of cervical lordosis. Skull base and vertebrae: Negative for acute fracture Soft tissues and spinal canal: No prevertebral fluid or swelling. No visible canal hematoma. Disc levels:  C4-5 and C5-6 disc degeneration. Upper chest: Negative IMPRESSION: No evidence of intracranial or cervical spine injury. Electronically Signed   By: Monte Fantasia M.D.   On: 11/03/2018 08:40    Procedures Procedures (including critical care time)  Medications Ordered in ED Medications  acetaminophen (TYLENOL) tablet 1,000 mg (1,000 mg Oral Given 11/03/18 0744)  ondansetron (ZOFRAN-ODT) disintegrating tablet 4 mg (4 mg Oral Given 11/03/18 0744)     Initial Impression / Assessment and Plan / ED Course  I have reviewed the triage vital signs and the nursing notes.  Pertinent labs & imaging results that were available during my care of the patient  were reviewed by me and considered in my medical decision making (see chart for details).  Clinical Course as of Nov 02 904  Mon Nov 03, 2018  0739 63 yo F here w/ posterior headache/occipital pain after mechanical fall. She is NVI here, AOx4, with no focal neuro deficits. No clinical or historical signs of basilar skull fx. Low concern for ICH but given  mechanism and HA, will check CT. Will add on C-Spine for mild base of head neck TTP. No UE or LE weakness, numbness, paresthesias, or sx to suggest central cord or occult spine injury.   [CI]  0855 CT scan negative. Pt with some mild dizziness likely post-concussive, but no neuro deficits, she remains AOx4. Not on blood thinners. Discussed concussion dx, precautions w/ pt, daughter via telephone. Will d/c with close f/u.   [CI]    Clinical Course User Index [CI] Duffy Bruce, MD        Final Clinical Impressions(s) / ED Diagnoses   Final diagnoses:  Concussion with loss of consciousness of 30 minutes or less, initial encounter  Traumatic brain injury, with loss of consciousness of 30 minutes or less, initial encounter [facility])    ED Discharge Orders         Ordered    ondansetron (ZOFRAN ODT) 4 MG disintegrating tablet  Every 8 hours PRN     11/03/18 0857    meclizine (ANTIVERT) 12.5 MG tablet  3 times daily PRN     11/03/18 0857    ibuprofen (ADVIL) 600 MG tablet  Every 6 hours PRN     11/03/18 0857           Duffy Bruce, MD 11/03/18 [phone]

## 2018-11-03 NOTE — ED Notes (Signed)
Bed: HO64 Expected date:  Expected time:  Means of arrival:  Comments: 63yo F/ Fall

## 2018-11-03 NOTE — ED Notes (Signed)
ED Provider at bedside. 

## 2018-11-03 NOTE — ED Notes (Signed)
EKG given to Dr. Isaacs 

## 2018-11-03 NOTE — ED Triage Notes (Signed)
Per [facility]- Pt is an employee at [employer]. Works in [employer]. Witness mechanical fall. Pt tripped over crate and landed first on her buttocks then hit her occipital area of her head on the floor. NO LOC. Pt [facility] cleared for transports. Pt denies any other complaints then and at this time. NO other trauma noted. Pt was no ambulatory on scene however upon arrival transferred to stretcher without difficulty. Neuro intact on scene and arrival.

## 2018-12-25 ENCOUNTER — Ambulatory Visit: Payer: Self-pay | Admitting: Family Medicine

## 2019-01-05 ENCOUNTER — Other Ambulatory Visit (HOSPITAL_COMMUNITY)
Admission: RE | Admit: 2019-01-05 | Discharge: 2019-01-05 | Disposition: A | Discharge status: Home / Self Care | Payer: Self-pay | Source: Ambulatory Visit | Attending: Radiation Oncology | Admitting: Radiation Oncology

## 2019-01-05 ENCOUNTER — Ambulatory Visit
Admission: RE | Admit: 2019-01-05 | Discharge: 2019-01-05 | Disposition: A | Discharge status: Home / Self Care | Payer: Self-pay | Source: Ambulatory Visit | Attending: Radiation Oncology | Admitting: Radiation Oncology

## 2019-01-05 ENCOUNTER — Ambulatory Visit: Payer: Self-pay | Attending: Family Medicine | Admitting: Family Medicine

## 2019-01-05 ENCOUNTER — Encounter: Payer: Self-pay | Admitting: Radiation Oncology

## 2019-01-05 ENCOUNTER — Other Ambulatory Visit: Payer: Self-pay

## 2019-01-05 ENCOUNTER — Encounter: Payer: Self-pay | Admitting: Family Medicine

## 2019-01-05 VITALS — BP 142/92 | HR 82 | Temp 97.8°F | Resp 18 | Ht 67.0 in | Wt 133.1 lb

## 2019-01-05 VITALS — BP 131/83 | HR 79 | Temp 97.9°F | Ht 67.0 in | Wt 133.0 lb

## 2019-01-05 DIAGNOSIS — R7303 Prediabetes: Secondary | ICD-10-CM

## 2019-01-05 DIAGNOSIS — Z9181 History of falling: Secondary | ICD-10-CM | POA: Insufficient documentation

## 2019-01-05 DIAGNOSIS — Z87898 Personal history of other specified conditions: Secondary | ICD-10-CM

## 2019-01-05 DIAGNOSIS — Z79899 Other long term (current) drug therapy: Secondary | ICD-10-CM | POA: Insufficient documentation

## 2019-01-05 DIAGNOSIS — C539 Malignant neoplasm of cervix uteri, unspecified: Secondary | ICD-10-CM

## 2019-01-05 DIAGNOSIS — Z923 Personal history of irradiation: Secondary | ICD-10-CM | POA: Insufficient documentation

## 2019-01-05 DIAGNOSIS — I10 Essential (primary) hypertension: Secondary | ICD-10-CM

## 2019-01-05 DIAGNOSIS — Z8541 Personal history of malignant neoplasm of cervix uteri: Secondary | ICD-10-CM | POA: Insufficient documentation

## 2019-01-05 DIAGNOSIS — E785 Hyperlipidemia, unspecified: Secondary | ICD-10-CM

## 2019-01-05 LAB — POCT GLYCOSYLATED HEMOGLOBIN (HGB A1C): Hemoglobin A1C: 6.2 % — AB (ref 4.0–5.6)

## 2019-01-05 NOTE — Progress Notes (Signed)
Per pt she need to talk about her Vitamin D  BP

## 2019-01-05 NOTE — Progress Notes (Signed)
Radiation Oncology         [phone] ________________________________  Name: Martha Koch MRN: [id number]  Date: 01/05/2019  DOB: Mar 28, 1956  Follow-Up Visit Note  CC: Antony Blackbird, MD  [name], MD    ICD-10-CM   1. Cervical cancer, FIGO stage IB1 ([facility])  C53.9 Cytology - PAP    Diagnosis:   63 y.o. 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    Since she was last seen, she had a routine screening mammogram on 09/01/18 which was normal.   She had a head/cervical spine CT without contrast on 11/03/18 after experiencing a fall at work and hitting her head. This revealed no evidence of intracranial or cervical spine injury.   On review of systems, she denies c/o pain. Pt denies dysuria/hematuria. Pt denies vaginal bleeding/discharge. Pt denies rectal bleeding, diarrhea/constipation. Pt denies abdominal bloating, N/V. Pertinent positives are listed and detailed within the above HPI.                               ALLERGIES:  is allergic to aspirin and penicillins.  Meds: Current Outpatient Medications  Medication Sig Dispense Refill  . amLODipine (NORVASC) 10 MG tablet Take 1 tablet (10 mg total) by mouth daily. 30 tablet 6  . Ascorbic Acid (VITAMIN C) 1000 MG tablet Take 1,000 mg by mouth daily. Reported on 08/26/2015    . atorvastatin (LIPITOR) 20 MG tablet Take 1 tablet (20 mg total) by mouth daily. 90 tablet 1  . cholecalciferol (VITAMIN D) 1000 units tablet Take 1,000 Units by mouth daily.    Marland Kitchen ibuprofen (ADVIL) 600 MG tablet Take 1 tablet (600  mg total) by mouth every 6 (six) hours as needed for headache. 30 tablet 0  . albuterol (PROVENTIL HFA;VENTOLIN HFA) 108 (90 Base) MCG/ACT inhaler Inhale 2 puffs into the lungs every 6 (six) hours as needed for wheezing or shortness of breath. 1 Inhaler 6  . meclizine (ANTIVERT) 12.5 MG tablet Take 1 tablet (12.5 mg total) by mouth 3 (three) times daily as needed for dizziness. (Patient not taking: Reported on 01/05/2019) 30 tablet 0  . ondansetron (ZOFRAN ODT) 4 MG disintegrating tablet Take 1 tablet (4 mg total) by mouth every 8 (eight) hours as needed for nausea or vomiting. (Patient not taking: Reported on 01/05/2019) 20 tablet 0   No current facility-administered medications for this encounter.    Review of Systems: REVIEW OF SYSTEMS: A 10+ POINT REVIEW OF SYSTEMS WAS OBTAINED including neurology, dermatology, psychiatry, cardiac, respiratory, lymph, extremities, GI, GU, musculoskeletal, constitutional, reproductive, HEENT. All pertinent positives are noted in the HPI. All others are negative.  Physical Findings:  The patient is in no acute distress. Patient is alert and oriented.  height is 5\' 7"  (1.702 m) and weight is 133 lb 2 oz (60.4 kg). Her oral temperature is 97.8 F (36.6 C). Her blood pressure is 142/92 (abnormal) and her pulse is 82. Her respiration is 18 and oxygen saturation is 100%.   Lungs are clear to auscultation bilaterally. Heart has  regular rate and rhythm. No palpable cervical, supraclavicular, or axillary adenopathy. Abdomen soft, non-tender, normal bowel sounds. On abdominal exam, there was a horizontal scar which shows some mild hyperpigmentation changes. No palpable mass noted along the abdominal wall. No inguinal adenopathy.   On pelvic examination the external genitalia were unremarkable. A speculum exam was performed. There are no mucosal lesions noted in the vaginal vault. A Pap smear was obtained of the proximal vagina. On bimanual and rectovaginal examination there were  no pelvic masses appreciated.    Lab Findings: Lab Results  Component Value Date   WBC 3.1 (L) 11/18/2014   HGB 12.8 11/18/2014   HCT 38.9 11/18/2014   MCV 82.4 11/18/2014   PLT 255 11/18/2014    Radiographic Findings: No results found.  Impression:  No evidence of recurrence on clincal exam today, Pap smear pending.   Plan:  The patient will return for routine follow up in 6 months.  ____________________________________  Blair Promise, PhD, MD  This document serves as a record of services personally performed by Gery Pray, MD. It was created on his behalf by Mary-Margaret Loma Messing, a trained medical scribe. The creation of this record is based on the scribe's personal observations and the provider's statements to them. This document has been checked and approved by the attending provider.

## 2019-01-05 NOTE — Patient Instructions (Signed)
Coronavirus (COVID-19) Are you at risk?  Are you at risk for the Coronavirus (COVID-19)?  To be considered HIGH RISK for Coronavirus (COVID-19), you have to meet the following criteria:  . Traveled to [place], [place], [place], [place] or [place]; or in the [place] to [place], [place], [place], or [place]; and have fever, cough, and shortness of breath within the last 2 weeks of travel OR . Been in close contact with a person diagnosed with COVID-19 within the last 2 weeks and have fever, cough, and shortness of breath . IF YOU DO NOT MEET THESE CRITERIA, YOU ARE CONSIDERED LOW RISK FOR COVID-19.  What to do if you are HIGH RISK for COVID-19?  . If you are having a medical emergency, call 911. . Seek medical care right away. Before you go to a doctor's office, urgent care or emergency department, call ahead and tell them about your recent travel, contact with someone diagnosed with COVID-19, and your symptoms. You should receive instructions from your physician's office regarding next steps of care.  . When you arrive at healthcare provider, tell the healthcare staff immediately you have returned from visiting [place], [place], [place], [place] or [place]; or traveled in the [place] to [place], [place], [place], or [place]; in the last two weeks or you have been in close contact with a person diagnosed with COVID-19 in the last 2 weeks.   . Tell the health care staff about your symptoms: fever, cough and shortness of breath. . After you have been seen by a medical provider, you will be either: o Tested for (COVID-19) and discharged home on quarantine except to seek medical care if symptoms worsen, and asked to  - Stay home and avoid contact with others until you get your results (4-5 days)  - Avoid travel on public transportation if possible (such as bus, train, or airplane) or o Sent to the Emergency Department by EMS for evaluation, COVID-19 testing, and possible  admission depending on your condition and test results.  What to do if you are LOW RISK for COVID-19?  Reduce your risk of any infection by using the same precautions used for avoiding the common cold or flu:  . Wash your hands often with soap and warm water for at least 20 seconds.  If soap and water are not readily available, use an alcohol-based hand sanitizer with at least 60% alcohol.  . If coughing or sneezing, cover your mouth and nose by coughing or sneezing into the elbow areas of your shirt or coat, into a tissue or into your sleeve (not your hands). . Avoid shaking hands with others and consider head nods or verbal greetings only. . Avoid touching your eyes, nose, or mouth with unwashed hands.  . Avoid close contact with people who are sick. . Avoid places or events with large numbers of people in one location, like concerts or sporting events. . Carefully consider travel plans you have or are making. . If you are planning any travel outside or inside the [place], visit the CDC's Travelers' Health webpage for the latest health notices. . If you have some symptoms but not all symptoms, continue to monitor at home and seek medical attention if your symptoms worsen. . If you are having a medical emergency, call 911.   ADDITIONAL HEALTHCARE OPTIONS FOR PATIENTS   Telehealth / e-Visit: [email or web address]         [facility]: [phone]  [facility]: [phone]                   [facility]: [phone]   

## 2019-01-05 NOTE — Progress Notes (Signed)
Established Patient Office Visit  Subjective:  Patient ID: Martha Koch, female    DOB: Sep 30, 1955  Age: 63 y.o. MRN: [id number]  CC: No chief complaint on file.   HPI Martha Koch presents for follow-up of hypertension and dyslipidemia. She reports that her cancer doctor did her pap smear today. She has been taking her blood pressure medication daily.  She has had no headaches or dizziness related to her blood pressure.  She denies any swelling in her legs related to her use of her blood pressure medication and she does not think that she needs a refill at today's visit.  Patient has taken the atorvastatin but states that it makes her feel funny and she wonders if she still needs to take this medication.  She also forgets to take vitamin D on a daily basis and wonders if she just needs to take it or can take it once per week.  She believes that she has had a bone density scan in the past but she is not sure when this was done.  She is just taking the vitamin D for bone health.  She overall feels well.  She states that she forgot to eat today because she had to doctors appointments and then stopped by her daughter's business to help out for a while.  Overall she feels well.  She has had no chest pain or palpitations, no shortness of breath or cough.  No recent fever or chills.  No unexplained weight loss or weight gain.  She reports that she does tend to stay up at night or if she awakens during the night she will get up and eat.  She states that she tends to sleep during the daytime/nap.  She is not sure why she has settled into the sleep pattern.  Patient was made aware of prior hemoglobin A1c of 5.8 but she denies any current issues with blurred vision, no urinary frequency and no increased thirst.    Past Medical History:  Diagnosis Date  . Cancer (HCC) cervical   . Hypertension   . Radiation 06/02/14-07/13/14   pelvis 45 gray, vaginal cuff/parametrial boost to 50.4 gray  . Radiation  04/06/15-05/13/15   anterior abdominal wall 50.4 gray    Past Surgical History:  Procedure Laterality Date  . ABDOMINAL HYSTERECTOMY    . robotic radical hysterectomy, bso, pelvic lymphadenectomy Bilateral 04/21/14  . TUBAL LIGATION      Family History  Problem Relation Age of Onset  . Hypertension Father   . Cancer Other   . Diabetes Other   . Cancer Maternal Aunt        breast cancer, x 4 aunts   . Breast cancer Daughter     Social History   Tobacco Use  . Smoking status: Current Every Day Smoker    Packs/day: 0.50    Years: 30.00    Pack years: 15.00    Types: Cigarettes  . Smokeless tobacco: Never Used  . Tobacco comment: smokes 2-3 a day  Substance Use Topics  . Alcohol use: No    Alcohol/week: 0.0 standard drinks  . Drug use: No    Outpatient Medications Prior to Visit  Medication Sig Dispense Refill  . albuterol (PROVENTIL HFA;VENTOLIN HFA) 108 (90 Base) MCG/ACT inhaler Inhale 2 puffs into the lungs every 6 (six) hours as needed for wheezing or shortness of breath. 1 Inhaler 6  . amLODipine (NORVASC) 10 MG tablet Take 1 tablet (10 mg total) by mouth daily.  30 tablet 6  . Ascorbic Acid (VITAMIN C) 1000 MG tablet Take 1,000 mg by mouth daily. Reported on 08/26/2015    . atorvastatin (LIPITOR) 20 MG tablet Take 1 tablet (20 mg total) by mouth daily. 90 tablet 1  . cholecalciferol (VITAMIN D) 1000 units tablet Take 1,000 Units by mouth daily.    Marland Kitchen ibuprofen (ADVIL) 600 MG tablet Take 1 tablet (600 mg total) by mouth every 6 (six) hours as needed for headache. 30 tablet 0  . meclizine (ANTIVERT) 12.5 MG tablet Take 1 tablet (12.5 mg total) by mouth 3 (three) times daily as needed for dizziness. (Patient not taking: Reported on 01/05/2019) 30 tablet 0  . ondansetron (ZOFRAN ODT) 4 MG disintegrating tablet Take 1 tablet (4 mg total) by mouth every 8 (eight) hours as needed for nausea or vomiting. (Patient not taking: Reported on 01/05/2019) 20 tablet 0   No  facility-administered medications prior to visit.     Allergies  Allergen Reactions  . Aspirin     REACTION: stomach  . Penicillins     REACTION: hives    ROS Review of Systems  Constitutional: Positive for fatigue (occasional, mild). Negative for chills and fever.  HENT: Negative for sore throat and trouble swallowing.   Eyes: Negative for photophobia and visual disturbance.  Respiratory: Negative for cough and shortness of breath.   Cardiovascular: Negative for chest pain, palpitations and leg swelling.  Gastrointestinal: Negative for abdominal pain, constipation, diarrhea and nausea.  Endocrine: Negative for cold intolerance, heat intolerance, polydipsia, polyphagia and polyuria.  Genitourinary: Negative for dysuria and frequency.  Musculoskeletal: Negative for arthralgias and back pain.  Neurological: Negative for dizziness and headaches.  Hematological: Negative for adenopathy. Does not bruise/bleed easily.      Objective:    Physical Exam  Constitutional: She is oriented to person, place, and time. She appears well-developed and well-nourished.  Neck: Normal range of motion. Neck supple. No JVD present. No thyromegaly present.  No carotid bruits  Cardiovascular: Normal rate and regular rhythm.  Pulmonary/Chest: Effort normal and breath sounds normal.  Abdominal: Soft. There is no abdominal tenderness. There is no rebound and no guarding.  Healed surgical scar left midabdomen  Musculoskeletal:        General: No tenderness or edema.  Lymphadenopathy:    She has no cervical adenopathy.  Neurological: She is alert and oriented to person, place, and time.  Skin: Skin is warm and dry.  Psychiatric: She has a normal mood and affect. Her behavior is normal.  Nursing note and vitals reviewed.   BP 131/83 (BP Location: Left Arm, Patient Position: Sitting, Cuff Size: Normal)   Pulse 79   Temp 97.9 F (36.6 C) (Oral)   Ht _0  (1.702 m)   Wt 133 lb (60.3 kg)   SpO2  97%   BMI 20.83 kg/m  Wt Readings from Last 3 Encounters:  01/05/19 133 lb (60.3 kg)  01/05/19 133 lb 2 oz (60.4 kg)  11/03/18 130 lb (59 kg)    Lab Results  Component Value Date   TSH 0.825 03/02/2014   Lab Results  Component Value Date   WBC 3.1 (L) 11/18/2014   HGB 12.8 11/18/2014   HCT 38.9 11/18/2014   MCV 82.4 11/18/2014   PLT 255 11/18/2014   Lab Results  Component Value Date   NA 143 04/21/2018   K 4.9 04/21/2018   CHLORIDE 110 (H) 09/23/2015   CO2 25 04/21/2018   GLUCOSE 86 04/21/2018  BUN 12 04/21/2018   CREATININE 0.86 04/21/2018   BILITOT 0.3 04/21/2018   ALKPHOS 71 04/21/2018   AST 14 04/21/2018   ALT 9 04/21/2018   PROT 7.1 04/21/2018   ALBUMIN 4.7 04/21/2018   CALCIUM 9.6 04/21/2018   ANIONGAP 8 09/23/2015   EGFR 84 (L) 09/23/2015   Lab Results  Component Value Date   CHOL 189 04/21/2018   Lab Results  Component Value Date   HDL 89 04/21/2018   Lab Results  Component Value Date   LDLCALC 91 04/21/2018   Lab Results  Component Value Date   TRIG 46 04/21/2018   Lab Results  Component Value Date   CHOLHDL 2.1 04/21/2018   Lab Results  Component Value Date   HGBA1C 5.8 08/26/2015      Assessment & Plan:  1. Essential hypertension Patient feels that her blood pressure has been controlled with the use of amlodipine and she reports that her repeat blood pressure today's visit was 010 systolic.  She will continue the current medication as well as a DASH diet  2. History of prediabetes; prediabetes Patient has had hemoglobin A1c of 5.8 in 2017 and will have repeat hemoglobin A1c at today's visit.  Patient's hemoglobin A1c at today's visit was elevated at 6.2 and I discussed with the patient that a hemoglobin A1c of 6.5 or higher was consistent with diagnosis of diabetes.  Patient states that she does tend to eat a lot of sweets especially at night and I discussed a low carbohydrate diet and substitutions for high carbohydrate foods as well  as avoidance of known concentrated sweets.  Patient will attempt to make dietary changes.  She will return in 4 to 5 months for repeat hemoglobin A1c but if she develops onset of increased thirst, urinary frequency or any concerns, she should return sooner for reevaluation for possible diabetes - HgB A1c  3. Dyslipidemia Patient with dyslipidemia but reports that atorvastatin causes her to feel funny.  She will have repeat lipid panel at today's visit and will be notified if she needs to continue statin therapy or if statin therapy can be changed since she does not tolerate the atorvastatin.  Continue low-fat diet. - Lipid panel  An After Visit Summary was printed and given to the patient.   Follow-up: Return in about 5 months (around 06/07/2019) for HTN/chronic issues 5-6 month f/u and have influenza immunization this fall.  Antony Blackbird, MD

## 2019-01-05 NOTE — Progress Notes (Signed)
Pt presents today for f/u with Dr. Sondra Come. Pt denies c/o pain. Pt denies dysuria/hematuria. Pt denies vaginal bleeding/discharge. Pt denies rectal bleeding, diarrhea/constipation. Pt denies abdominal bloating, N/V.  BP (!) 142/92 (BP Location: Left Arm, Patient Position: Sitting)   Pulse 82   Temp 97.8 F (36.6 C) (Oral)   Resp 18   Ht 5\' 7"  (1.702 m)   Wt 133 lb 2 oz (60.4 kg)   SpO2 100%   BMI 20.85 kg/m   Wt Readings from Last 3 Encounters:  01/05/19 133 lb 2 oz (60.4 kg)  11/03/18 130 lb (59 kg)  08/26/18 128 lb (58.1 kg)   Loma Sousa, RN BSN

## 2019-01-06 LAB — LIPID PANEL
Chol/HDL Ratio: 2.5 ratio (ref 0.0–4.4)
Cholesterol, Total: 189 mg/dL (ref 100–199)
HDL: 76 mg/dL
LDL Calculated: 101 mg/dL — ABNORMAL HIGH (ref 0–99)
Triglycerides: 59 mg/dL (ref 0–149)
VLDL Cholesterol Cal: 12 mg/dL (ref 5–40)

## 2019-01-09 LAB — CYTOLOGY - PAP
Diagnosis: UNDETERMINED — AB
HPV: NOT DETECTED

## 2019-01-12 ENCOUNTER — Telehealth: Payer: Self-pay

## 2019-01-12 NOTE — Telephone Encounter (Signed)
Left VM detailing good Pap results per Dr. Clabe Seal request. This RN's direct number left on identified VM for any further questions/concerns. Loma Sousa, RN BSN

## 2019-01-21 ENCOUNTER — Telehealth: Payer: Self-pay

## 2019-01-21 NOTE — Telephone Encounter (Signed)
Patient returned phone call and was informed of lab results. 

## 2019-01-21 NOTE — Telephone Encounter (Signed)
Patient was called and a voicemail was left informing patient to return phone call for lab results. 

## 2019-01-21 NOTE — Telephone Encounter (Signed)
-----   Message from Antony Blackbird, MD sent at 01/17/2019  8:18 PM EDT ----- Lipid panel with LDL 101, continue cholesterol medication and a healthy diet

## 2019-05-18 ENCOUNTER — Ambulatory Visit: Payer: Self-pay | Admitting: Family Medicine

## 2019-05-22 ENCOUNTER — Other Ambulatory Visit: Payer: Self-pay

## 2019-05-22 ENCOUNTER — Encounter: Payer: Self-pay | Admitting: Family Medicine

## 2019-05-22 ENCOUNTER — Encounter (INDEPENDENT_AMBULATORY_CARE_PROVIDER_SITE_OTHER): Payer: Self-pay

## 2019-05-22 ENCOUNTER — Ambulatory Visit: Payer: Self-pay | Attending: Family Medicine | Admitting: Family Medicine

## 2019-05-22 VITALS — BP 144/88 | HR 85 | Temp 98.3°F | Resp 18 | Ht 67.0 in | Wt 133.0 lb

## 2019-05-22 DIAGNOSIS — Z720 Tobacco use: Secondary | ICD-10-CM

## 2019-05-22 DIAGNOSIS — R7303 Prediabetes: Secondary | ICD-10-CM

## 2019-05-22 DIAGNOSIS — I10 Essential (primary) hypertension: Secondary | ICD-10-CM

## 2019-05-22 DIAGNOSIS — J439 Emphysema, unspecified: Secondary | ICD-10-CM

## 2019-05-22 DIAGNOSIS — Z79899 Other long term (current) drug therapy: Secondary | ICD-10-CM

## 2019-05-22 DIAGNOSIS — E785 Hyperlipidemia, unspecified: Secondary | ICD-10-CM

## 2019-05-22 LAB — POCT GLYCOSYLATED HEMOGLOBIN (HGB A1C): Hemoglobin A1C: 5.8 % — AB (ref 4.0–5.6)

## 2019-05-22 NOTE — Patient Instructions (Signed)
Steps to Quit Smoking Smoking tobacco is the leading cause of preventable death. It can affect almost every organ in the body. Smoking puts you and people around you at risk for many serious, long-lasting (chronic) diseases. Quitting smoking can be hard, but it is one of the best things that you can do for your health. It is never too late to quit. How do I get ready to quit? When you decide to quit smoking, make a plan to help you succeed. Before you quit:  Pick a date to quit. Set a date within the next 2 weeks to give you time to prepare.  Write down the reasons why you are quitting. Keep this list in places where you will see it often.  Tell your family, friends, and co-workers that you are quitting. Their support is important.  Talk with your doctor about the choices that may help you quit.  Find out if your health insurance will pay for these treatments.  Know the people, places, things, and activities that make you want to smoke (triggers). Avoid them. What first steps can I take to quit smoking?  Throw away all cigarettes at home, at work, and in your car.  Throw away the things that you use when you smoke, such as ashtrays and lighters.  Clean your car. Make sure to empty the ashtray.  Clean your home, including curtains and carpets. What can I do to help me quit smoking? Talk with your doctor about taking medicines and seeing a counselor at the same time. You are more likely to succeed when you do both.  If you are pregnant or breastfeeding, talk with your doctor about counseling or other ways to quit smoking. Do not take medicine to help you quit smoking unless your doctor tells you to do so. To quit smoking: Quit right away  Quit smoking totally, instead of slowly cutting back on how much you smoke over a period of time.  Go to counseling. You are more likely to quit if you go to counseling sessions regularly. Take medicine You may take medicines to help you quit. Some  medicines need a prescription, and some you can buy over-the-counter. Some medicines may contain a drug called nicotine to replace the nicotine in cigarettes. Medicines may:  Help you to stop having the desire to smoke (cravings).  Help to stop the problems that come when you stop smoking (withdrawal symptoms). Your doctor may ask you to use:  Nicotine patches, gum, or lozenges.  Nicotine inhalers or sprays.  Non-nicotine medicine that is taken by mouth. Find resources Find resources and other ways to help you quit smoking and remain smoke-free after you quit. These resources are most helpful when you use them often. They include:  Online chats with a counselor.  Phone quitlines.  Printed self-help materials.  Support groups or group counseling.  Text messaging programs.  Mobile phone apps. Use apps on your mobile phone or tablet that can help you stick to your quit plan. There are many free apps for mobile phones and tablets as well as websites. Examples include Quit Guide from the CDC and smokefree.gov  What things can I do to make it easier to quit?   Talk to your family and friends. Ask them to support and encourage you.  Call a phone quitline (1-800-QUIT-NOW), reach out to support groups, or work with a counselor.  Ask people who smoke to not smoke around you.  Avoid places that make you want to smoke,   such as: ? Bars. ? Parties. ? Smoke-break areas at work.  Spend time with people who do not smoke.  Lower the stress in your life. Stress can make you want to smoke. Try these things to help your stress: ? Getting regular exercise. ? Doing deep-breathing exercises. ? Doing yoga. ? Meditating. ? Doing a body scan. To do this, close your eyes, focus on one area of your body at a time from head to toe. Notice which parts of your body are tense. Try to relax the muscles in those areas. How will I feel when I quit smoking? Day 1 to 3 weeks Within the first 24 hours,  you may start to have some problems that come from quitting tobacco. These problems are very bad 2-3 days after you quit, but they do not often last for more than 2-3 weeks. You may get these symptoms:  Mood swings.  Feeling restless, nervous, angry, or annoyed.  Trouble concentrating.  Dizziness.  Strong desire for high-sugar foods and nicotine.  Weight gain.  Trouble pooping (constipation).  Feeling like you may vomit (nausea).  Coughing or a sore throat.  Changes in how the medicines that you take for other issues work in your body.  Depression.  Trouble sleeping (insomnia). Week 3 and afterward After the first 2-3 weeks of quitting, you may start to notice more positive results, such as:  Better sense of smell and taste.  Less coughing and sore throat.  Slower heart rate.  Lower blood pressure.  Clearer skin.  Better breathing.  Fewer sick days. Quitting smoking can be hard. Do not give up if you fail the first time. Some people need to try a few times before they succeed. Do your best to stick to your quit plan, and talk with your doctor if you have any questions or concerns. Summary  Smoking tobacco is the leading cause of preventable death. Quitting smoking can be hard, but it is one of the best things that you can do for your health.  When you decide to quit smoking, make a plan to help you succeed.  Quit smoking right away, not slowly over a period of time.  When you start quitting, seek help from your doctor, family, or friends. This information is not intended to replace advice given to you by your health care provider. Make sure you discuss any questions you have with your health care provider. Document Released: 04/14/2009 Document Revised: 09/05/2018 Document Reviewed: 09/06/2018 Elsevier Patient Education  2020 [employer] with Quitting Smoking  Quitting smoking is a physical and mental challenge. You will face cravings, withdrawal  symptoms, and temptation. Before quitting, work with your health care provider to make a plan that can help you cope. Preparation can help you quit and keep you from giving in. How can I cope with cravings? Cravings usually last for 5-10 minutes. If you get through it, the craving will pass. Consider taking the following actions to help you cope with cravings:  Keep your mouth busy: ? Chew sugar-free gum. ? Suck on hard candies or a straw. ? Brush your teeth.  Keep your hands and body busy: ? Immediately change to a different activity when you feel a craving. ? Squeeze or play with a ball. ? Do an activity or a hobby, like making bead jewelry, practicing needlepoint, or working with wood. ? Mix up your normal routine. ? Take a short exercise break. Go for a quick walk or run up and down  stairs. ? Spend time in public places where smoking is not allowed.  Focus on doing something kind or helpful for someone else.  Call a friend or family member to talk during a craving.  Join a support group.  Call a quit line, such as 1-800-QUIT-NOW.  Talk with your health care provider about medicines that might help you cope with cravings and make quitting easier for you. How can I deal with withdrawal symptoms? Your body may experience negative effects as it tries to get used to not having nicotine in the system. These effects are called withdrawal symptoms. They may include:  Feeling hungrier than normal.  Trouble concentrating.  Irritability.  Trouble sleeping.  Feeling depressed.  Restlessness and agitation.  Craving a cigarette. To manage withdrawal symptoms:  Avoid places, people, and activities that trigger your cravings.  Remember why you want to quit.  Get plenty of sleep.  Avoid coffee and other caffeinated drinks. These may worsen some of your symptoms. How can I handle social situations? Social situations can be difficult when you are quitting smoking, especially in  the first few weeks. To manage this, you can:  Avoid parties, bars, and other social situations where people might be smoking.  Avoid alcohol.  Leave right away if you have the urge to smoke.  Explain to your family and friends that you are quitting smoking. Ask for understanding and support.  Plan activities with friends or family where smoking is not an option. What are some ways I can cope with stress? Wanting to smoke may cause stress, and stress can make you want to smoke. Find ways to manage your stress. Relaxation techniques can help. For example:  Breathe slowly and deeply, in through your nose and out through your mouth.  Listen to soothing, relaxing music.  Talk with a family member or friend about your stress.  Light a candle.  Soak in a bath or take a shower.  Think about a peaceful place. What are some ways I can prevent weight gain? Be aware that many people gain weight after they quit smoking. However, not everyone does. To keep from gaining weight, have a plan in place before you quit and stick to the plan after you quit. Your plan should include:  Having healthy snacks. When you have a craving, it may help to: ? Eat plain popcorn, crunchy carrots, celery, or other cut vegetables. ? Chew sugar-free gum.  Changing how you eat: ? Eat small portion sizes at meals. ? Eat 4-6 small meals throughout the day instead of 1-2 large meals a day. ? Be mindful when you eat. Do not watch television or do other things that might distract you as you eat.  Exercising regularly: ? Make time to exercise each day. If you do not have time for a long workout, do short bouts of exercise for 5-10 minutes several times a day. ? Do some form of strengthening exercise, like weight lifting, and some form of aerobic exercise, like running or swimming.  Drinking plenty of water or other low-calorie or no-calorie drinks. Drink 6-8 glasses of water daily, or as much as instructed by your  health care provider. Summary  Quitting smoking is a physical and mental challenge. You will face cravings, withdrawal symptoms, and temptation to smoke again. Preparation can help you as you go through these challenges.  You can cope with cravings by keeping your mouth busy (such as by chewing gum), keeping your body and hands busy, and making calls  to family, friends, or a helpline for people who want to quit smoking.  You can cope with withdrawal symptoms by avoiding places where people smoke, avoiding drinks with caffeine, and getting plenty of rest.  Ask your health care provider about the different ways to prevent weight gain, avoid stress, and handle social situations. This information is not intended to replace advice given to you by your health care provider. Make sure you discuss any questions you have with your health care provider. Document Released: 06/15/2016 Document Revised: 05/31/2017 Document Reviewed: 06/15/2016 Elsevier Patient Education  2020 [employer].

## 2019-05-22 NOTE — Progress Notes (Signed)
Established Patient Office Visit  Subjective:  Patient ID: Martha Koch, female    DOB: 12/23/1955  Age: 63 y.o. MRN: [id number]  CC:  Chief Complaint  Patient presents with  . Hypertension    HPI [name], 63 yo [place]- American female, who was last seen in the office on 08/26/2018, who returns for follow-up of chronic medical issues including hypertension, prediabetes, COPD/emphysema and patient additionally has past history of cervical cancer status post treatment for which she has followed by oncology.  She feels that overall her health is stable.  She is status post emergency department visit earlier this year after tripping over a small box at work which caused her to fall backwards and hit her head and patient had a concussion.  She denies any current headaches or dizziness.  She occasionally gets some neck stiffness since her fall.  She remains compliant with her blood pressure medication and she denies any headaches or dizziness related to her blood pressure.  No current shortness of breath or cough related to her COPD.  She does continue to smoke and would be interested in quitting smoking but does not feel that she can stop smoking at this time due to increased stress related to the COVID-19 pandemic.           She reports that since being told that she had prediabetes earlier this year that she has made changes in her diet and lifestyle.  She is stopped drinking any soda and has been able to follow a low carbohydrate diet by decreasing bread, rice and pasta.  She feels much better at this time.  She denies any increased thirst, no urinary frequency and no blurred vision.  She has had no increased muscle or joint pain with the use of her cholesterol medication, atorvastatin.  She admits that she is not taking the atorvastatin regularly as she hopes that her efforts at dietary changes and weight loss have also improved her lipids.  She is nonfasting at today's visit.  Overall  she feels much better at this time.  Past Medical History:  Diagnosis Date  . Cancer (HCC) cervical   . Hypertension   . Radiation 06/02/14-07/13/14   pelvis 45 gray, vaginal cuff/parametrial boost to 50.4 gray  . Radiation 04/06/15-05/13/15   anterior abdominal wall 50.4 gray    Past Surgical History:  Procedure Laterality Date  . ABDOMINAL HYSTERECTOMY    . robotic radical hysterectomy, bso, pelvic lymphadenectomy Bilateral 04/21/14  . TUBAL LIGATION      Family History  Problem Relation Age of Onset  . Hypertension Father   . Cancer Other   . Diabetes Other   . Cancer Maternal Aunt        breast cancer, x 4 aunts   . Breast cancer Daughter     Social History   Socioeconomic History  . Marital status: Single    Spouse name: Not on file  . Number of children: 3  . Years of education: 38  . Highest education level: Not on file  Occupational History  . Occupation: [employer]     Employer: [employer]  . Financial resource strain: Not on file  . Food insecurity    Worry: Not on file    Inability: Not on file  . Transportation needs    Medical: Not on file    Non-medical: Not on file  Tobacco Use  . Smoking status: Current Every Day Smoker  Packs/day: 0.50    Years: 30.00    Pack years: 15.00    Types: Cigarettes  . Smokeless tobacco: Never Used  . Tobacco comment: smokes 2-3 a day  Substance and Sexual Activity  . Alcohol use: No    Alcohol/week: 0.0 standard drinks  . Drug use: No  . Sexual activity: Not Currently  Lifestyle  . Physical activity    Days per week: Not on file    Minutes per session: Not on file  . Stress: Not on file  Relationships  . Social Herbalist on phone: Not on file    Gets together: Not on file    Attends religious service: Not on file    Active member of club or organization: Not on file    Attends meetings of clubs or organizations: Not on file    Relationship status: Not on file  .  Intimate partner violence    Fear of current or ex partner: Not on file    Emotionally abused: Not on file    Physically abused: Not on file    Forced sexual activity: Not on file  Other Topics Concern  . Not on file  Social History Narrative   Has 3 children. They live in [place].   Has 3 grandchildren.    Martha Koch (oldest daughter)    Lives alone.     Outpatient Medications Prior to Visit  Medication Sig Dispense Refill  . albuterol (PROVENTIL HFA;VENTOLIN HFA) 108 (90 Base) MCG/ACT inhaler Inhale 2 puffs into the lungs every 6 (six) hours as needed for wheezing or shortness of breath. 1 Inhaler 6  . amLODipine (NORVASC) 10 MG tablet Take 1 tablet (10 mg total) by mouth daily. 30 tablet 6  . Ascorbic Acid (VITAMIN C) 1000 MG tablet Take 1,000 mg by mouth daily. Reported on 08/26/2015    . atorvastatin (LIPITOR) 20 MG tablet Take 1 tablet (20 mg total) by mouth daily. 90 tablet 1  . cholecalciferol (VITAMIN D) 1000 units tablet Take 1,000 Units by mouth daily.    Marland Kitchen ibuprofen (ADVIL) 600 MG tablet Take 1 tablet (600 mg total) by mouth every 6 (six) hours as needed for headache. 30 tablet 0  . meclizine (ANTIVERT) 12.5 MG tablet Take 1 tablet (12.5 mg total) by mouth 3 (three) times daily as needed for dizziness. (Patient not taking: Reported on 01/05/2019) 30 tablet 0  . ondansetron (ZOFRAN ODT) 4 MG disintegrating tablet Take 1 tablet (4 mg total) by mouth every 8 (eight) hours as needed for nausea or vomiting. (Patient not taking: Reported on 01/05/2019) 20 tablet 0   No facility-administered medications prior to visit.     Allergies  Allergen Reactions  . Aspirin     REACTION: stomach  . Penicillins     REACTION: hives    ROS Review of Systems  Constitutional: Negative for chills, fatigue and fever.  HENT: Negative for sore throat and trouble swallowing.   Eyes: Negative for photophobia and visual disturbance.  Respiratory: Negative for cough and shortness of breath.     Cardiovascular: Negative for chest pain and leg swelling.  Gastrointestinal: Negative for abdominal pain, blood in stool, constipation, diarrhea and nausea.  Endocrine: Negative for cold intolerance, heat intolerance, polydipsia, polyphagia and polyuria.  Genitourinary: Negative for dysuria, flank pain and frequency.  Musculoskeletal: Negative for arthralgias and back pain.  Neurological: Negative for dizziness and headaches.  Hematological: Negative for adenopathy. Does not bruise/bleed easily.  Psychiatric/Behavioral: Negative for  self-injury, sleep disturbance and suicidal ideas. The patient is not nervous/anxious.       Objective:    Physical Exam  Constitutional: She is oriented to person, place, and time. She appears well-developed and well-nourished.  Well-nourished well-developed older female in no acute distress wearing mask as per office COVID-19 protocols  Neck: Normal range of motion. Neck supple. No JVD present.  Cardiovascular: Normal rate and regular rhythm.  Pulmonary/Chest: Effort normal and breath sounds normal.  Abdominal: Soft. There is no abdominal tenderness. There is no rebound and no guarding.  Musculoskeletal:        General: No tenderness or edema.     Comments: No CVA tenderness  Lymphadenopathy:    She has no cervical adenopathy.  Neurological: She is alert and oriented to person, place, and time.  Psychiatric: She has a normal mood and affect. Her behavior is normal.  Nursing note and vitals reviewed.   BP (!) 144/88 (BP Location: Left Arm, Patient Position: Sitting, Cuff Size: Normal)   Pulse 85   Temp 98.3 F (36.8 C) (Oral)   Resp 18   Ht 5' 7"  (1.702 m)   Wt 133 lb (60.3 kg)   SpO2 99%   BMI 20.83 kg/m  Wt Readings from Last 3 Encounters:  05/22/19 133 lb (60.3 kg)  01/05/19 133 lb 2 oz (60.4 kg)  01/05/19 133 lb (60.3 kg)     Health Maintenance Due  Topic Date Due  . INFLUENZA VACCINE  01/31/2019   Influenza immunization was  offered at today's visit but patient reports she already received immunization at her workplace   Lab Results  Component Value Date   TSH 0.825 03/02/2014   Lab Results  Component Value Date   WBC 3.1 (L) 11/18/2014   HGB 12.8 11/18/2014   HCT 38.9 11/18/2014   MCV 82.4 11/18/2014   PLT 255 11/18/2014   Lab Results  Component Value Date   NA 143 04/21/2018   K 4.9 04/21/2018   CHLORIDE 110 (H) 09/23/2015   CO2 25 04/21/2018   GLUCOSE 86 04/21/2018   BUN 12 04/21/2018   CREATININE 0.86 04/21/2018   BILITOT 0.3 04/21/2018   ALKPHOS 71 04/21/2018   AST 14 04/21/2018   ALT 9 04/21/2018   PROT 7.1 04/21/2018   ALBUMIN 4.7 04/21/2018   CALCIUM 9.6 04/21/2018   ANIONGAP 8 09/23/2015   EGFR 84 (L) 09/23/2015   Lab Results  Component Value Date   CHOL 189 01/05/2019   Lab Results  Component Value Date   HDL 76 01/05/2019   Lab Results  Component Value Date   LDLCALC 101 (H) 01/05/2019   Lab Results  Component Value Date   TRIG 59 01/05/2019   Lab Results  Component Value Date   CHOLHDL 2.5 01/05/2019   Lab Results  Component Value Date   HGBA1C 6.2 (A) 01/05/2019      Assessment & Plan:  1. Essential hypertension Patient's blood pressure is near goal of less than 140/90 at today's visit and would ultimately like patient's blood pressure to be around 130/80 or less.  Patient is encouraged to continue a Dash type diet along with regular exercise.  She reports that she has additional medication refills.  She has been asked to call at least a week before she will run out of medications when she needs refills so that she does not miss any days of her medication.  Discussed importance of daily compliance with medication.  Patient will have basic  metabolic panel in follow-up of use of medication as well as to check renal function due to her hypertension. - Basic Metabolic Panel  2. Prediabetes Patient with prediabetes and hemoglobin A1c on 01/05/2019 was elevated at  6.2.  She reports that since that time she has made dietary changes and has tried to increase her level of activity.  Patient was made aware that her A1c at today's visit improved and is now at 5.8.  She is encouraged to continue her dietary changes and exercise.  She is not interested in the use of metformin to help with insulin resistance.  BMP will be done in follow-up of diabetes. - HgB K4M - Basic Metabolic Panel  3. Dyslipidemia she has had dyslipidemia and has been on atorvastatin with most recent LDL of 101. She has made dietary changes due to diagnosis of pre-diabetes earlier this year.  Basic metabolic panel will be done and follow-up of dyslipidemia and at her next visit, she will have repeat lipid panel.  Continue low-fat diet and regular exercise.  Consider continued atorvastatin use to help reduce risk of CAD.  She also had evidence of abdominal aortic atherosclerosis on prior CT imaging. - Basic Metabolic Panel  4. Pulmonary emphysema, unspecified emphysema type [facility]) Patient continues to smoke and discussed with the patient that she has had prior CT scan done in March 2018 per oncology with findings of centrilobular and paraseptal emphysema as well as prior 3 mm left upper lobe nodule.  The importance of complete smoking cessation to help prevent worsening of COPD was discussed as well as patient's increased risk of lung cancer or oxygen dependence due to ongoing lung damage related to her smoking was also discussed.  Prescription was provided for albuterol inhaler to use as needed for shortness of breath.   5. Tobacco use Approximately 6 minutes was spent with the patient discussing the need for complete smoking cessation, discussion of prior imaging showing centrilobular and paraseptal emphysema and a 3 mm left upper lobe nodule.  Patient was offered referral to the clinical pharmacist to help with smoking cessation which she declines at this time.  Discussed interventions such as  nicotine lozenges that patient could use to help decrease nicotine cravings.  Discussed observing daily routine for known triggers associated with her smoking so that as she is preparing to set a quit date she can no which activities tend to trigger her smoking and make adjustments.  Patient also told about the [place] quit line to help with smoking cessation.  6. Encounter for long-term (current) use of medications She will have basic metabolic panel in follow-up of long-term use of medications for treatment of her hypertension, hyperlipidemia/dyslipidemia and history of cervical cancer status post radiation therapy. - Basic Metabolic Panel  An After Visit Summary was printed and given to the patient.  Follow-up: Return in about 6 months (around 11/19/2019) for chronic issues.   Antony Blackbird, MD

## 2019-05-23 LAB — BASIC METABOLIC PANEL WITH GFR
BUN/Creatinine Ratio: 16 (ref 12–28)
BUN: 18 mg/dL (ref 8–27)
CO2: 22 mmol/L (ref 20–29)
Calcium: 9.8 mg/dL (ref 8.7–10.3)
Chloride: 106 mmol/L (ref 96–106)
Creatinine, Ser: 1.1 mg/dL — ABNORMAL HIGH (ref 0.57–1.00)
GFR calc Af Amer: 61 mL/min/1.73
GFR calc non Af Amer: 53 mL/min/1.73 — ABNORMAL LOW
Glucose: 80 mg/dL (ref 65–99)
Potassium: 5.5 mmol/L — ABNORMAL HIGH (ref 3.5–5.2)
Sodium: 144 mmol/L (ref 134–144)

## 2019-05-24 ENCOUNTER — Other Ambulatory Visit: Payer: Self-pay | Admitting: Family Medicine

## 2019-05-24 DIAGNOSIS — R7989 Other specified abnormal findings of blood chemistry: Secondary | ICD-10-CM

## 2019-05-24 DIAGNOSIS — E875 Hyperkalemia: Secondary | ICD-10-CM

## 2019-05-24 NOTE — Progress Notes (Signed)
Patient ID: Martha Koch, female   DOB: [birth year], 63 y.o.   MRN: [id number]   Patient with increased potassium level of 5.5 and mild increase in creatinine at 1.10  on recent labs.  She will be contacted and asked to return to clinic this week for repeat BMP in follow-up of hyperkalemia and mild increase in creatinine.  She will also be encouraged to remain well-hydrated, make sure the blood pressure remains controlled and avoid the use of nonsteroidal anti-inflammatories.

## 2019-05-25 ENCOUNTER — Telehealth: Payer: Self-pay | Admitting: *Deleted

## 2019-05-25 NOTE — Telephone Encounter (Signed)
Medical Assistant left message on patient's home and cell voicemail. Voicemail states to give a call back to [place] with [facility] at [phone]. !!Please schedule for repeat BMP in lab this week!!

## 2019-05-25 NOTE — Telephone Encounter (Signed)
-----   Message from Antony Blackbird, MD sent at 05/24/2019 11:57 PM EST ----- Please asked patient to return to clinic this week for repeat BMP in follow-up of elevated potassium of 5.5 with normal being 3.5-5.2 and patient with mild increase in creatinine at 1.10.  She needs to make sure that she remains well-hydrated, continue to make sure that her blood pressure is controlled and try to avoid or limit the use of nonsteroidal anti-inflammatory such as ibuprofen or naproxen.

## 2019-05-26 ENCOUNTER — Telehealth: Payer: Self-pay | Admitting: Family Medicine

## 2019-05-26 DIAGNOSIS — E785 Hyperlipidemia, unspecified: Secondary | ICD-10-CM

## 2019-05-26 DIAGNOSIS — I10 Essential (primary) hypertension: Secondary | ICD-10-CM

## 2019-05-26 MED ORDER — ATORVASTATIN CALCIUM 20 MG PO TABS: 20.0000 mg | ORAL_TABLET | Freq: Every day | ORAL | 2 refills | Status: DC

## 2019-05-26 MED ORDER — AMLODIPINE BESYLATE 10 MG PO TABS: 10.0000 mg | ORAL_TABLET | Freq: Every day | ORAL | 2 refills | Status: DC

## 2019-05-26 NOTE — Telephone Encounter (Signed)
1) Medication(s) Requested (by name):amLODipine (NORVASC) 10 MG tablet [id number]   atorvastatin (LIPITOR) 20 MG tablet [id number]   2) Pharmacy of Choice:  3) Special Requests:   Approved medications will be sent to the pharmacy, we will reach out if there is an issue.  Requests made after 3pm may not be addressed until the following business day!  If a patient is unsure of the name of the medication(s) please note and ask patient to call back when they are able to provide all info, do not send to responsible party until all information is available!

## 2019-05-27 ENCOUNTER — Other Ambulatory Visit: Payer: Self-pay

## 2019-05-27 ENCOUNTER — Ambulatory Visit: Payer: Self-pay | Attending: Family Medicine

## 2019-05-27 DIAGNOSIS — R7989 Other specified abnormal findings of blood chemistry: Secondary | ICD-10-CM

## 2019-05-27 DIAGNOSIS — E875 Hyperkalemia: Secondary | ICD-10-CM

## 2019-05-28 LAB — BASIC METABOLIC PANEL WITH GFR
BUN/Creatinine Ratio: 15 (ref 12–28)
BUN: 13 mg/dL (ref 8–27)
CO2: 20 mmol/L (ref 20–29)
Calcium: 9.4 mg/dL (ref 8.7–10.3)
Chloride: 105 mmol/L (ref 96–106)
Creatinine, Ser: 0.89 mg/dL (ref 0.57–1.00)
GFR calc Af Amer: 79 mL/min/1.73
GFR calc non Af Amer: 69 mL/min/1.73
Glucose: 116 mg/dL — ABNORMAL HIGH (ref 65–99)
Potassium: 5 mmol/L (ref 3.5–5.2)
Sodium: 142 mmol/L (ref 134–144)

## 2019-06-01 ENCOUNTER — Telehealth: Payer: Self-pay | Admitting: *Deleted

## 2019-06-01 NOTE — Telephone Encounter (Signed)
-----   Message from Antony Blackbird, MD sent at 05/29/2019  3:00 PM EST ----- Creatinine which is a measurement of renal function is now normal. Glucose of 116 and otherwise normal BMP

## 2019-06-01 NOTE — Telephone Encounter (Signed)
Medical Assistant left message on patient's home and cell voicemail. Voicemail states to give a call back to [place] with [facility] at [phone]. Patient is aware of kidney function and mineral levels being normal.

## 2019-06-03 ENCOUNTER — Telehealth: Payer: Self-pay

## 2019-06-03 ENCOUNTER — Other Ambulatory Visit: Payer: Self-pay | Admitting: Family Medicine

## 2019-06-03 DIAGNOSIS — I10 Essential (primary) hypertension: Secondary | ICD-10-CM

## 2019-06-03 MED ORDER — FELODIPINE ER 10 MG PO TB24: 10.0000 mg | ORAL_TABLET | Freq: Every day | ORAL | 1 refills | Status: DC

## 2019-06-03 NOTE — Telephone Encounter (Signed)
Pt's ins does not cover amlodipine(surprisingly) and prefers Nifedipine or Felodipine.  If appropriate can you please change therapy and send in new script to our location?

## 2019-06-03 NOTE — Telephone Encounter (Signed)
New prescription sent in for felodipine 10 mg daily in place of amlodipine and referral placed to clinical pharmacist, Lurena Joiner to have patient's blood pressure rechecked in 3 to 4 weeks and to make sure that she is tolerating the change in medication

## 2019-06-03 NOTE — Progress Notes (Signed)
Patient ID: Martha Koch, female   DOB: Oct 02, 1955, 63 y.o.   MRN: [id number]   Message received from pharmacy that patient's amlodipine is not covered by her insurance.  We will switch patient to felodipine 10 mg which is covered and have patient follow-up with clinical pharmacist in 3 to 4 weeks for blood pressure recheck and to make sure that she is tolerating the medication.

## 2019-07-10 ENCOUNTER — Telehealth: Payer: Self-pay | Admitting: Family Medicine

## 2019-07-10 NOTE — Telephone Encounter (Signed)
I lvm  To patient to contact us to schedule an appointment with luke for htn  Thank you

## 2019-07-13 ENCOUNTER — Ambulatory Visit
Admission: RE | Admit: 2019-07-13 | Discharge: 2019-07-13 | Disposition: A | Discharge status: Home / Self Care | Payer: Self-pay | Source: Ambulatory Visit | Attending: Radiation Oncology | Admitting: Radiation Oncology

## 2019-07-23 ENCOUNTER — Other Ambulatory Visit: Payer: Self-pay

## 2019-07-23 ENCOUNTER — Ambulatory Visit: Payer: Self-pay | Attending: Family Medicine | Admitting: Pharmacist

## 2019-07-23 VITALS — BP 149/92 | HR 89

## 2019-07-23 DIAGNOSIS — I10 Essential (primary) hypertension: Secondary | ICD-10-CM

## 2019-07-23 MED ORDER — AMLODIPINE BESYLATE 10 MG PO TABS: 10.0000 mg | ORAL_TABLET | Freq: Every day | ORAL | 1 refills | Status: DC

## 2019-07-23 NOTE — Progress Notes (Signed)
   S:    PCP: Dr. Chapman Fitch   Patient arrives in good spirits. Presents to the clinic for BP check.  Patient was last seen by Primary Care Provider on 05/22/19. Of note, PCP changed amlodipine to felodipine on  06/03/19.   Patient reports adherence with medications. Patient's insurance prefers for her to fill at [facility]. She was able to obtain amlodipine rx from them. She is taking amlodipine and not felodipine.   Current BP Medications include:  Amlodipine 10 mg daily  Dietary habits include: limits salt; admits to drinking multiple cups of coffee throughout the day Exercise habits include: none outside of work Family / Social history:  - FHx: DM, HTN  - Tobacco: current 0.5 PPD smoker - Alcohol: denies use   O:  Vitals:   07/23/19 1545  BP: (!) 149/92  Pulse: 89   Home BP readings: none  Last 3 Office BP readings: BP Readings from Last 3 Encounters:  05/22/19 (!) 144/88  01/05/19 (!) 142/92  01/05/19 131/83    BMET    Component Value Date/Time   NA 142 05/27/2019 0901   NA 145 09/23/2015 1136   K 5.0 05/27/2019 0901   K 4.7 09/23/2015 1136   CL 105 05/27/2019 0901   CO2 20 05/27/2019 0901   CO2 27 09/23/2015 1136   GLUCOSE 116 (H) 05/27/2019 0901   GLUCOSE 115 09/23/2015 1136   BUN 13 05/27/2019 0901   BUN 9.7 09/23/2015 1136   CREATININE 0.89 05/27/2019 0901   CREATININE 0.9 09/23/2015 1136   CALCIUM 9.4 05/27/2019 0901   CALCIUM 9.9 09/23/2015 1136   GFRNONAA 69 05/27/2019 0901   GFRAA 79 05/27/2019 0901    Renal function: CrCl cannot be calculated (Patient's most recent lab result is older than the maximum 21 days allowed.).  Clinical ASCVD: No  The 10-year ASCVD risk score Mikey Bussing DC Jr., et al., 2013) is: 18.8%   Values used to calculate the score:     Age: 64 years     Sex: Female     Is Non-Hispanic African American: Yes     Diabetic: No     Tobacco smoker: Yes     Systolic Blood Pressure: 123456 mmHg     Is BP treated: Yes     HDL Cholesterol: 76  mg/dL     Total Cholesterol: 189 mg/dL  A/P: Hypertension longstanding currently uncontrolled on current medications. BP Goal <130/80 mmHg. Patient reports adherence to amlodipine but takes in the evening. I have advised her to return next week in the AM for a recheck. Additionally, she drank a cup of coffee before seeing me today. I advised her to avoid caffeine prior to next week's appt.   -Continued amlodipine.  -Counseled on lifestyle modifications for blood pressure control including reduced dietary sodium, increased exercise, adequate sleep  Results reviewed and written information provided.   Total time in face-to-face counseling 15 minutes.   F/U Clinic Visit in 1 week.   Benard Halsted, PharmD, [place] [phone]

## 2019-07-31 ENCOUNTER — Ambulatory Visit: Payer: Self-pay | Attending: Family Medicine | Admitting: Pharmacist

## 2019-07-31 ENCOUNTER — Encounter: Payer: Self-pay | Admitting: Pharmacist

## 2019-07-31 ENCOUNTER — Other Ambulatory Visit: Payer: Self-pay

## 2019-07-31 VITALS — BP 136/89 | HR 80

## 2019-07-31 DIAGNOSIS — I10 Essential (primary) hypertension: Secondary | ICD-10-CM

## 2019-07-31 NOTE — Progress Notes (Signed)
   S:    PCP: Dr. Chapman Fitch   Patient arrives in good spirits. Presents to the clinic for BP check.  Patient was last seen by Primary Care Provider on 05/22/19.  Patient reports adherence with medications. Took last night.   She denies drinking coffee this AM. She has not smoked a cigarette since yesterday morning.   Current BP Medications include:  Amlodipine 10 mg daily  Dietary habits include: limits salt; admits to drinking multiple cups of coffee throughout the day Exercise habits include: none outside of work Family / Social history:  - FHx: DM, HTN  - Tobacco: current 0.5 PPD smoker (trying to quit) - Alcohol: denies use   O:  Vitals:   07/31/19 0928  BP: 136/89  Pulse: 80   Home BP readings: none  Last 3 Office BP readings: BP Readings from Last 3 Encounters:  07/31/19 136/89  07/23/19 (!) 149/92  05/22/19 (!) 144/88   BMET    Component Value Date/Time   NA 142 05/27/2019 0901   NA 145 09/23/2015 1136   K 5.0 05/27/2019 0901   K 4.7 09/23/2015 1136   CL 105 05/27/2019 0901   CO2 20 05/27/2019 0901   CO2 27 09/23/2015 1136   GLUCOSE 116 (H) 05/27/2019 0901   GLUCOSE 115 09/23/2015 1136   BUN 13 05/27/2019 0901   BUN 9.7 09/23/2015 1136   CREATININE 0.89 05/27/2019 0901   CREATININE 0.9 09/23/2015 1136   CALCIUM 9.4 05/27/2019 0901   CALCIUM 9.9 09/23/2015 1136   GFRNONAA 69 05/27/2019 0901   GFRAA 79 05/27/2019 0901    Renal function: CrCl cannot be calculated (Patient's most recent lab result is older than the maximum 21 days allowed.).  Clinical ASCVD: No  The 10-year ASCVD risk score Mikey Bussing DC Jr., et al., 2013) is: 16.5%   Values used to calculate the score:     Age: 27 years     Sex: Female     Is Non-Hispanic African American: Yes     Diabetic: No     Tobacco smoker: Yes     Systolic Blood Pressure: [id number] mmHg     Is BP treated: Yes     HDL Cholesterol: 76 mg/dL     Total Cholesterol: 189 mg/dL  A/P: Hypertension longstanding currently  uncontrolled on current medications. BP Goal <130/80 mmHg. Patient reports adherence to amlodipine.  With her ASCVD risk, I discussed the importance of her BP being at goal. BP has improved since last visit d/t reduction in caffeine comsumption and no tobacco use in ~24 hours. I encouraged her to build on these improvements and increase exercise. We discussed additional therapy but she is adamant about not trying at this time.  -Continued amlodipine.  -Counseled on lifestyle modifications for blood pressure control including reduced dietary sodium, increased exercise, adequate sleep  Results reviewed and written information provided.   Total time in face-to-face counseling 15 minutes.   F/U Clinic Visit in 1 week.   Benard Halsted, PharmD, [place] [phone]

## 2019-09-18 ENCOUNTER — Encounter: Payer: Self-pay | Admitting: Gastroenterology

## 2019-11-18 ENCOUNTER — Telehealth: Payer: Self-pay

## 2019-11-19 ENCOUNTER — Encounter: Payer: Self-pay | Admitting: Gastroenterology

## 2019-11-20 ENCOUNTER — Other Ambulatory Visit: Payer: Self-pay

## 2019-11-20 ENCOUNTER — Encounter: Payer: Self-pay | Admitting: Family Medicine

## 2019-11-20 ENCOUNTER — Ambulatory Visit: Payer: Self-pay | Attending: Family Medicine | Admitting: Family Medicine

## 2019-11-20 VITALS — BP 125/84 | HR 88 | Temp 97.9°F | Ht 67.0 in | Wt 128.2 lb

## 2019-11-20 DIAGNOSIS — E785 Hyperlipidemia, unspecified: Secondary | ICD-10-CM

## 2019-11-20 DIAGNOSIS — Z79899 Other long term (current) drug therapy: Secondary | ICD-10-CM

## 2019-11-20 DIAGNOSIS — I10 Essential (primary) hypertension: Secondary | ICD-10-CM

## 2019-11-20 DIAGNOSIS — F17200 Nicotine dependence, unspecified, uncomplicated: Secondary | ICD-10-CM

## 2019-11-20 DIAGNOSIS — R7303 Prediabetes: Secondary | ICD-10-CM

## 2019-11-20 DIAGNOSIS — Z1211 Encounter for screening for malignant neoplasm of colon: Secondary | ICD-10-CM

## 2019-11-20 MED ORDER — ATORVASTATIN CALCIUM 20 MG PO TABS: 20.0000 mg | ORAL_TABLET | Freq: Every day | ORAL | 3 refills | Status: DC

## 2019-11-20 MED ORDER — AMLODIPINE BESYLATE 10 MG PO TABS: 10.0000 mg | ORAL_TABLET | Freq: Every day | ORAL | 3 refills | Status: DC

## 2019-11-20 NOTE — Progress Notes (Signed)
Subjective:  Patient ID: Martha Koch, female    DOB: 03-15-1956  Age: 64 y.o. MRN: [id number]  CC: Follow-up hypertension/prediabetes/lipids  HPI Martha Koch, 64 year old African-American female last seen in the office 05/22/2019 in follow-up of hypertension, prediabetes and COPD.  She additionally has a history of cervical cancer.  At today's visit, she reports that she is doing well.  She has her blood pressure checked at work and it is usually in the 140s over 80s.  Patient states that it would perhaps be lower she had a check to the again another day instead of after she has been working all day.  She denies any headaches or dizziness related to her blood pressure.  She has not had any muscle aches associated with her use of cholesterol medicine.  She has had her COVID-19 vaccination but forgot to bring her vaccination card.  She denies any increased thirst or urinary frequency related to her diabetes and she has made changes in her diet that she believes has helped both her blood pressure and blood sugars.  She has had no current issues with joint pain.  Overall she feels well.  She has had no abdominal pain, no nausea/vomiting/diarrhea or constipation, no blood in the stool and no black stools.  She does have upcoming appointment with GI for her colonoscopy.  Past Medical History:  Diagnosis Date  . Cervical cancer (HCC) cervical  . Hypertension   . Radiation 06/02/14-07/13/14   pelvis 45 gray, vaginal cuff/parametrial boost to 50.4 gray  . Radiation 04/06/15-05/13/15   anterior abdominal wall 50.4 gray    Past Surgical History:  Procedure Laterality Date  . ABDOMINAL HYSTERECTOMY    . robotic radical hysterectomy, bso, pelvic lymphadenectomy Bilateral 04/21/14  . TUBAL LIGATION      Family History  Problem Relation Age of Onset  . Hypertension Father   . Cancer Other   . Diabetes Other   . Breast cancer Maternal Aunt        breast cancer, x 4 aunts   . Breast cancer  Daughter     Social History   Tobacco Use  . Smoking status: Current Every Day Smoker    Packs/day: 0.50    Years: 30.00    Pack years: 15.00    Types: Cigarettes  . Smokeless tobacco: Never Used  . Tobacco comment: smokes 2-3 a day  Substance Use Topics  . Alcohol use: No    Alcohol/week: 0.0 standard drinks    ROS Review of Systems  Constitutional: Negative for chills, fatigue and fever.  HENT: Negative for sore throat and trouble swallowing.   Respiratory: Negative for cough and shortness of breath.   Cardiovascular: Negative for chest pain, palpitations and leg swelling.  Gastrointestinal: Negative for abdominal pain, blood in stool, constipation, diarrhea and nausea.  Endocrine: Negative for polydipsia and polyphagia.  Genitourinary: Negative for dysuria and frequency.  Musculoskeletal: Negative for arthralgias and back pain.  Neurological: Negative for dizziness and headaches.  Hematological: Negative for adenopathy. Does not bruise/bleed easily.  All other systems reviewed and are negative.   Objective:   Today's Vitals: BP 125/84   Pulse 88   Temp 97.9 F (36.6 C) (Temporal)   Ht 5\' 7"  (1.702 m)   Wt 128 lb 3.2 oz (58.2 kg)   SpO2 100%   BMI 20.08 kg/m   Physical Exam Constitutional:      Appearance: Normal appearance.  Neck:     Vascular: No carotid bruit.  Cardiovascular:  Rate and Rhythm: Normal rate and regular rhythm.  Pulmonary:     Effort: Pulmonary effort is normal.  Abdominal:     General: Abdomen is flat.     Palpations: Abdomen is soft.     Tenderness: There is no abdominal tenderness. There is no right CVA tenderness, left CVA tenderness, guarding or rebound.  Musculoskeletal:     Cervical back: Normal range of motion and neck supple. No rigidity or tenderness.     Right lower leg: No edema.     Left lower leg: No edema.  Lymphadenopathy:     Cervical: No cervical adenopathy.  Skin:    General: Skin is warm and dry.    Neurological:     General: No focal deficit present.     Mental Status: She is alert and oriented to person, place, and time.  Psychiatric:        Mood and Affect: Mood normal.        Behavior: Behavior normal.     Assessment & Plan:  1. Essential hypertension Patient had BMP done in November 2020 with normal creatinine. Blood pressure appears to be well controlled and stable. Continue amlodipine 10 mg daily. Lipid panel will also be done at today's visit. Continue low-sodium diet. - Lipid panel - amLODipine (NORVASC) 10 MG tablet; Take 1 tablet (10 mg total) by mouth daily.  Dispense: 90 tablet; Refill: 3  2. Dyslipidemia Continue use of atorvastatin. We will recheck lipid panel and hepatic function panel at today's visit. Patient has had coffee with a small amount of sugar prior to today's visit. Continue low-fat diet. - Lipid panel - Hepatic Function Panel - atorvastatin (LIPITOR) 20 MG tablet; Take 1 tablet (20 mg total) by mouth daily.  Dispense: 90 tablet; Refill: 3  3. Prediabetes R may reported the patient's hemoglobin A1c at today's visit is 5.6. Patient was made aware. Continue healthy diet.  4. Encounter for long-term (current) use of medications Hepatic function panel being done in follow-up of long-term use of atorvastatin for treatment of hyperlipidemia - Hepatic Function Panel  5. Screening for colon cancer Patient reports that she is already scheduled her appointment for colonoscopy. Referral placed so that notes will be directed to this office - Ambulatory referral to Gastroenterology  6. Tobacco dependence Patient reports that she began smoking at age 21 and believes that this was usually around have a pack per day. Patient is agreeable to smoking cessation program and also discussed possibility of being able to refer patient to our program for low cost or free low-dose CT scan as a screening test for lung cancer. I have sent a message to a pulmonologist to obtain  more information and patient has been asked to call back in the next 2 weeks if she has not heard from this office regarding low-dose CT scan a screening test for lung cancer. More than 5 minutes spent on today's discussions regarding tobacco dependence/smoking cessation. - Ambulatory referral to Smoking Cessation Program  Outpatient Encounter Medications as of 11/20/2019  Medication Sig  . albuterol (PROVENTIL HFA;VENTOLIN HFA) 108 (90 Base) MCG/ACT inhaler Inhale 2 puffs into the lungs every 6 (six) hours as needed for wheezing or shortness of breath.  Marland Kitchen amLODipine (NORVASC) 10 MG tablet Take 1 tablet (10 mg total) by mouth daily.  . Ascorbic Acid (VITAMIN C) 1000 MG tablet Take 1,000 mg by mouth daily. Reported on 08/26/2015  . atorvastatin (LIPITOR) 20 MG tablet Take 1 tablet (20 mg total) by mouth  daily.  . cholecalciferol (VITAMIN D) 1000 units tablet Take 1,000 Units by mouth daily.  Marland Kitchen ibuprofen (ADVIL) 600 MG tablet Take 1 tablet (600 mg total) by mouth every 6 (six) hours as needed for headache.  . [DISCONTINUED] amLODipine (NORVASC) 10 MG tablet Take 1 tablet (10 mg total) by mouth daily.  . [DISCONTINUED] atorvastatin (LIPITOR) 20 MG tablet Take 1 tablet (20 mg total) by mouth daily.  . [DISCONTINUED] meclizine (ANTIVERT) 12.5 MG tablet Take 1 tablet (12.5 mg total) by mouth 3 (three) times daily as needed for dizziness. (Patient not taking: Reported on 01/05/2019)  . [DISCONTINUED] ondansetron (ZOFRAN ODT) 4 MG disintegrating tablet Take 1 tablet (4 mg total) by mouth every 8 (eight) hours as needed for nausea or vomiting. (Patient not taking: Reported on 01/05/2019)   No facility-administered encounter medications on file as of 11/20/2019.    An After Visit Summary was printed and given to the patient.   Follow-up:Return in about 6 months (around 05/22/2020) for Chronic issues and as needed. Appt in 3-4 weeks with CPP/Luke for help with smoking cessation  Approximately 20 minutes of  face-to-face time was spent with the patient at today's visit with an additional time for review of chart, prior visits, prior labs and completion of today's note of approximately 7 minutes.  Antony Blackbird MD

## 2019-11-20 NOTE — Patient Instructions (Signed)
Steps to Quit Smoking Smoking tobacco is the leading cause of preventable death. It can affect almost every organ in the body. Smoking puts you and people around you at risk for many serious, long-lasting (chronic) diseases. Quitting smoking can be hard, but it is one of the best things that you can do for your health. It is never too late to quit. How do I get ready to quit? When you decide to quit smoking, make a plan to help you succeed. Before you quit:  Pick a date to quit. Set a date within the next 2 weeks to give you time to prepare.  Write down the reasons why you are quitting. Keep this list in places where you will see it often.  Tell your family, friends, and co-workers that you are quitting. Their support is important.  Talk with your doctor about the choices that may help you quit.  Find out if your health insurance will pay for these treatments.  Know the people, places, things, and activities that make you want to smoke (triggers). Avoid them. What first steps can I take to quit smoking?  Throw away all cigarettes at home, at work, and in your car.  Throw away the things that you use when you smoke, such as ashtrays and lighters.  Clean your car. Make sure to empty the ashtray.  Clean your home, including curtains and carpets. What can I do to help me quit smoking? Talk with your doctor about taking medicines and seeing a counselor at the same time. You are more likely to succeed when you do both.  If you are pregnant or breastfeeding, talk with your doctor about counseling or other ways to quit smoking. Do not take medicine to help you quit smoking unless your doctor tells you to do so. To quit smoking: Quit right away  Quit smoking totally, instead of slowly cutting back on how much you smoke over a period of time.  Go to counseling. You are more likely to quit if you go to counseling sessions regularly. Take medicine You may take medicines to help you quit. Some  medicines need a prescription, and some you can buy over-the-counter. Some medicines may contain a drug called nicotine to replace the nicotine in cigarettes. Medicines may:  Help you to stop having the desire to smoke (cravings).  Help to stop the problems that come when you stop smoking (withdrawal symptoms). Your doctor may ask you to use:  Nicotine patches, gum, or lozenges.  Nicotine inhalers or sprays.  Non-nicotine medicine that is taken by mouth. Find resources Find resources and other ways to help you quit smoking and remain smoke-free after you quit. These resources are most helpful when you use them often. They include:  Online chats with a counselor.  Phone quitlines.  Printed self-help materials.  Support groups or group counseling.  Text messaging programs.  Mobile phone apps. Use apps on your mobile phone or tablet that can help you stick to your quit plan. There are many free apps for mobile phones and tablets as well as websites. Examples include Quit Guide from the CDC and smokefree.gov  What things can I do to make it easier to quit?   Talk to your family and friends. Ask them to support and encourage you.  Call a phone quitline (1-800-QUIT-NOW), reach out to support groups, or work with a counselor.  Ask people who smoke to not smoke around you.  Avoid places that make you want to smoke,   such as: ? Bars. ? Parties. ? Smoke-break areas at work.  Spend time with people who do not smoke.  Lower the stress in your life. Stress can make you want to smoke. Try these things to help your stress: ? Getting regular exercise. ? Doing deep-breathing exercises. ? Doing yoga. ? Meditating. ? Doing a body scan. To do this, close your eyes, focus on one area of your body at a time from head to toe. Notice which parts of your body are tense. Try to relax the muscles in those areas. How will I feel when I quit smoking? Day 1 to 3 weeks Within the first 24 hours,  you may start to have some problems that come from quitting tobacco. These problems are very bad 2-3 days after you quit, but they do not often last for more than 2-3 weeks. You may get these symptoms:  Mood swings.  Feeling restless, nervous, angry, or annoyed.  Trouble concentrating.  Dizziness.  Strong desire for high-sugar foods and nicotine.  Weight gain.  Trouble pooping (constipation).  Feeling like you may vomit (nausea).  Coughing or a sore throat.  Changes in how the medicines that you take for other issues work in your body.  Depression.  Trouble sleeping (insomnia). Week 3 and afterward After the first 2-3 weeks of quitting, you may start to notice more positive results, such as:  Better sense of smell and taste.  Less coughing and sore throat.  Slower heart rate.  Lower blood pressure.  Clearer skin.  Better breathing.  Fewer sick days. Quitting smoking can be hard. Do not give up if you fail the first time. Some people need to try a few times before they succeed. Do your best to stick to your quit plan, and talk with your doctor if you have any questions or concerns. Summary  Smoking tobacco is the leading cause of preventable death. Quitting smoking can be hard, but it is one of the best things that you can do for your health.  When you decide to quit smoking, make a plan to help you succeed.  Quit smoking right away, not slowly over a period of time.  When you start quitting, seek help from your doctor, family, or friends. This information is not intended to replace advice given to you by your health care provider. Make sure you discuss any questions you have with your health care provider. Document Revised: 03/13/2019 Document Reviewed: 09/06/2018 Elsevier Patient Education  2020 Elsevier Inc.  

## 2019-11-20 NOTE — Progress Notes (Signed)
6 mo f/u  a1c today is 5.8

## 2019-11-21 LAB — HEPATIC FUNCTION PANEL
ALT: 10 IU/L (ref 0–32)
AST: 17 IU/L (ref 0–40)
Albumin: 4.7 g/dL (ref 3.8–4.8)
Alkaline Phosphatase: 80 IU/L (ref 48–121)
Bilirubin Total: 0.3 mg/dL (ref 0.0–1.2)
Bilirubin, Direct: 0.08 mg/dL (ref 0.00–0.40)
Total Protein: 7.2 g/dL (ref 6.0–8.5)

## 2019-11-21 LAB — LIPID PANEL
Chol/HDL Ratio: 2.6 ratio (ref 0.0–4.4)
Cholesterol, Total: 197 mg/dL (ref 100–199)
HDL: 77 mg/dL
LDL Chol Calc (NIH): 110 mg/dL — ABNORMAL HIGH (ref 0–99)
Triglycerides: 56 mg/dL (ref 0–149)
VLDL Cholesterol Cal: 10 mg/dL (ref 5–40)

## 2019-11-24 ENCOUNTER — Other Ambulatory Visit: Payer: Self-pay | Admitting: Family Medicine

## 2019-11-24 DIAGNOSIS — Z1231 Encounter for screening mammogram for malignant neoplasm of breast: Secondary | ICD-10-CM

## 2019-12-04 ENCOUNTER — Other Ambulatory Visit: Payer: Self-pay

## 2019-12-04 ENCOUNTER — Ambulatory Visit
Admission: RE | Admit: 2019-12-04 | Discharge: 2019-12-04 | Disposition: A | Discharge status: Home / Self Care | Payer: No Typology Code available for payment source | Source: Ambulatory Visit | Attending: Family Medicine | Admitting: Family Medicine

## 2019-12-04 DIAGNOSIS — Z1231 Encounter for screening mammogram for malignant neoplasm of breast: Secondary | ICD-10-CM

## 2019-12-15 ENCOUNTER — Ambulatory Visit: Payer: Self-pay | Admitting: Pharmacist

## 2019-12-18 ENCOUNTER — Encounter: Payer: Self-pay | Admitting: Pharmacist

## 2019-12-18 ENCOUNTER — Ambulatory Visit: Payer: No Typology Code available for payment source | Attending: Family Medicine | Admitting: Pharmacist

## 2019-12-18 ENCOUNTER — Other Ambulatory Visit: Payer: Self-pay

## 2019-12-18 DIAGNOSIS — Z716 Tobacco abuse counseling: Secondary | ICD-10-CM | POA: Diagnosis not present

## 2019-12-18 MED ORDER — NICOTINE 21 MG/24HR TD PT24: 21.0000 mg | MEDICATED_PATCH | Freq: Every day | TRANSDERMAL | 1 refills | Status: DC

## 2019-12-18 MED ORDER — NICOTINE POLACRILEX 4 MG MT GUM: 4.0000 mg | CHEWING_GUM | OROMUCOSAL | 0 refills | Status: DC | PRN

## 2019-12-18 NOTE — Progress Notes (Signed)
eS:  Patient was reviewed by clinical pharmacist for assistance with tobacco cessation. Patient identity was verified using date of birth and address.  Tobacco Use History  Age when started using tobacco on a daily basis 64 YO.  Type: cigarettes.  Number of cigarettes per day 10  Estimated nicotine content: 8-12 mg per day.   Smokes first cigarette <30 minutes after waking.  Does wake at night to smoke  Fagerstrom Score 10/10.  Triggers include physical: wakes to smoke in the morning. Physiological: after eating.   Quit Attempt History   Most recent quit attempt : pt cannot recall.  Longest time ever been tobacco free: before she picked up the habit at 64 YO  Methods tried in the past include None.   Rates IMPORTANCE of quitting tobacco on 1-10 scale of 10.  Rates READINESS of quitting tobacco on 1-10 scale of 10.  Rates CONFIDENCE of quitting tobacco on 1-10 scale of 10.  Motivators to quitting include personal health outcomes, family; barriers include fear of failure.   A/P: Nicotine dependence: severe, >40 years duration.  Reviewed options and patient reports interest in NRT. Initiated Nicotine Patch with gum for breakthrough. Since pt is smoking 10 cigarettes per day, will initiate 21 mg.day transdermal patch and plan for 6 weeks of this. Will evaluate progress in 1 month. Pt will begin 4 mg gum to use prn. Discussed that she can use gum every 1-2 hours. Treatment was reviewed with the patient, including name, instructions, goals of therapy, potential side effects, importance of adherence, and safe use.  Reviewed potential challenges and coping skills/strategies with patient. Provided information on 1 800-QUIT NOW support program and advised patient to contact me if questions/concerns arise. Patient verbalized understanding of information by repeating back.  Follow up in 1 month(s)   Benard Halsted, PharmD, [place] [phone]

## 2019-12-21 MED FILL — NICOTINE 21 MG/24HR PATCH: 21 | 28 days supply | Qty: 28 | Fill #0

## 2019-12-22 ENCOUNTER — Other Ambulatory Visit: Payer: Self-pay

## 2019-12-22 ENCOUNTER — Ambulatory Visit (AMBULATORY_SURGERY_CENTER): Payer: Self-pay

## 2019-12-22 VITALS — Ht 67.0 in | Wt 127.2 lb

## 2019-12-22 DIAGNOSIS — Z1211 Encounter for screening for malignant neoplasm of colon: Secondary | ICD-10-CM

## 2019-12-22 MED FILL — ATORVASTATIN CALCIUM 20 MG: 20 | 30 days supply | Qty: 30 | Fill #0

## 2019-12-22 NOTE — Progress Notes (Signed)
No allergies to soy or egg Pt is not on blood thinners or diet pills Denies issues with sedation/intubation Denies atrial flutter/fib Denies constipation   Emmi instructions given to pt  Pt is aware of Covid safety and care partner requirements.  

## 2020-01-05 ENCOUNTER — Other Ambulatory Visit: Payer: Self-pay

## 2020-01-05 ENCOUNTER — Ambulatory Visit (AMBULATORY_SURGERY_CENTER): Payer: No Typology Code available for payment source | Admitting: Gastroenterology

## 2020-01-05 ENCOUNTER — Encounter: Payer: Self-pay | Admitting: Gastroenterology

## 2020-01-05 VITALS — BP 119/73 | HR 64 | Temp 96.2°F | Resp 15 | Ht 67.0 in | Wt 127.0 lb

## 2020-01-05 DIAGNOSIS — Z1211 Encounter for screening for malignant neoplasm of colon: Secondary | ICD-10-CM

## 2020-01-05 MED ORDER — SODIUM CHLORIDE 0.9 % IV SOLN: 500.0000 mL | Freq: Once | INTRAVENOUS | Status: DC

## 2020-01-05 NOTE — Op Note (Signed)
[place] Patient Name: Martha Koch Procedure Date: 01/05/2020 9:10 AM MRN: [id number] Endoscopist: Mallie Mussel L. Loletha Carrow , MD Age: 64 Referring MD:  Date of Birth: [birth year] Gender: Female Account #: [id number] Procedure:                Colonoscopy Indications:              Screening for colorectal malignant neoplasm (no                            polyps 2011) Medicines:                Monitored Anesthesia Care Procedure:                Pre-Anesthesia Assessment:                           - Prior to the procedure, a History and Physical                            was performed, and patient medications and                            allergies were reviewed. The patient's tolerance of                            previous anesthesia was also reviewed. The risks                            and benefits of the procedure and the sedation                            options and risks were discussed with the patient.                            All questions were answered, and informed consent                            was obtained. Prior Anticoagulants: The patient has                            taken no previous anticoagulant or antiplatelet                            agents. ASA Grade Assessment: II - A patient with                            mild systemic disease. After reviewing the risks                            and benefits, the patient was deemed in                            satisfactory condition to undergo the procedure.  After obtaining informed consent, the colonoscope                            was passed under direct vision. Throughout the                            procedure, the patient's blood pressure, pulse, and                            oxygen saturations were monitored continuously. The                            Colonoscope was introduced through the anus and                            advanced to the the cecum, identified by                             appendiceal orifice and ileocecal valve. The                            colonoscopy was performed without difficulty. The                            patient tolerated the procedure well. The quality                            of the bowel preparation was excellent. The                            ileocecal valve, appendiceal orifice, and rectum                            were photographed. The bowel preparation used was                            Miralax. Scope In: 9:15:26 AM Scope Out: 9:28:21 AM Scope Withdrawal Time: 0 hours 9 minutes 4 seconds  Total Procedure Duration: 0 hours 12 minutes 55 seconds  Findings:                 The perianal and digital rectal examinations were                            normal.                           A few diverticula were found in the left colon and                            right colon.                           The exam was otherwise without abnormality on  direct and retroflexion views. Complications:            No immediate complications. Estimated Blood Loss:     Estimated blood loss: none. Impression:               - Diverticulosis in the left colon and in the right                            colon.                           - The examination was otherwise normal on direct                            and retroflexion views.                           - No specimens collected. Recommendation:           - Patient has a contact number available for                            emergencies. The signs and symptoms of potential                            delayed complications were discussed with the                            patient. Return to normal activities tomorrow.                            Written discharge instructions were provided to the                            patient.                           - Resume previous diet.                           - Continue present medications.                            - Repeat colonoscopy in 10 years for screening                            purposes. Dhanush Jokerst L. Loletha Carrow, MD 01/05/2020 9:34:04 AM This report has been signed electronically.

## 2020-01-05 NOTE — Progress Notes (Signed)
Pt's states no medical or surgical changes since previsit or office visit. 

## 2020-01-05 NOTE — Progress Notes (Signed)
To PACU, VSS. Report to [email or web address] 

## 2020-01-05 NOTE — Patient Instructions (Signed)
YOU HAD AN ENDOSCOPIC PROCEDURE TODAY AT THE [facility]:   Refer to the procedure report that was given to you for any specific questions about what was found during the examination.  If the procedure report does not answer your questions, please call your gastroenterologist to clarify.  If you requested that your care partner not be given the details of your procedure findings, then the procedure report has been included in a sealed envelope for you to review at your convenience later.  YOU SHOULD EXPECT: Some feelings of bloating in the abdomen. Passage of more gas than usual.  Walking can help get rid of the air that was put into your GI tract during the procedure and reduce the bloating. If you had a lower endoscopy (such as a colonoscopy or flexible sigmoidoscopy) you may notice spotting of blood in your stool or on the toilet paper. If you underwent a bowel prep for your procedure, you may not have a normal bowel movement for a few days.  Please Note:  You might notice some irritation and congestion in your nose or some drainage.  This is from the oxygen used during your procedure.  There is no need for concern and it should clear up in a day or so.  SYMPTOMS TO REPORT IMMEDIATELY:   Following lower endoscopy (colonoscopy or flexible sigmoidoscopy):  Excessive amounts of blood in the stool  Significant tenderness or worsening of abdominal pains  Swelling of the abdomen that is new, acute  Fever of 100F or higher   For urgent or emergent issues, a gastroenterologist can be reached at any hour by calling [phone]. Do not use MyChart messaging for urgent concerns.    DIET:  We do recommend a small meal at first, but then you may proceed to your regular diet.  Drink plenty of fluids but you should avoid alcoholic beverages for 24 hours.  MEDICATIONS: Continue present medications.  Please see handouts given to you by your recovery nurse.  ACTIVITY:  You should plan to  take it easy for the rest of today and you should NOT DRIVE or use heavy machinery until tomorrow (because of the sedation medicines used during the test).    FOLLOW UP: Our staff will call the number listed on your records 48-72 hours following your procedure to check on you and address any questions or concerns that you may have regarding the information given to you following your procedure. If we do not reach you, we will leave a message.  We will attempt to reach you two times.  During this call, we will ask if you have developed any symptoms of COVID 19. If you develop any symptoms (ie: fever, flu-like symptoms, shortness of breath, cough etc.) before then, please call [phone].  If you test positive for Covid 19 in the 2 weeks post procedure, please call and report this information to us.    If any biopsies were taken you will be contacted by phone or by letter within the next 1-3 weeks.  Please call us at [phone] if you have not heard about the biopsies in 3 weeks.   Thank you for allowing us to provide for your healthcare needs today.   SIGNATURES/CONFIDENTIALITY: You and/or your care partner have signed paperwork which will be entered into your electronic medical record.  These signatures attest to the fact that that the information above on your After Visit Summary has been reviewed and is understood.  Full responsibility of the   confidentiality of this discharge information lies with you and/or your care-partner. 

## 2020-01-07 ENCOUNTER — Telehealth: Payer: Self-pay

## 2020-01-07 NOTE — Telephone Encounter (Signed)
Left message on follow up call. 

## 2020-01-13 ENCOUNTER — Encounter: Payer: Self-pay | Admitting: Family Medicine

## 2020-01-18 ENCOUNTER — Ambulatory Visit: Payer: No Typology Code available for payment source | Admitting: Pharmacist

## 2020-01-19 ENCOUNTER — Encounter: Payer: Self-pay | Admitting: Family Medicine

## 2020-01-19 NOTE — Telephone Encounter (Signed)
resolved 

## 2020-03-23 ENCOUNTER — Other Ambulatory Visit: Payer: Self-pay | Admitting: Family Medicine

## 2020-03-23 DIAGNOSIS — Z122 Encounter for screening for malignant neoplasm of respiratory organs: Secondary | ICD-10-CM

## 2020-03-23 DIAGNOSIS — F17200 Nicotine dependence, unspecified, uncomplicated: Secondary | ICD-10-CM

## 2020-03-23 NOTE — Progress Notes (Signed)
Patient ID: Martha Koch, female   DOB: April 14, 1956, 63 y.o.   MRN: [id number]   Order placed for screening low dose chest due to tobacco dependence and a screening test for lung cancer.

## 2020-03-24 ENCOUNTER — Telehealth: Payer: Self-pay | Admitting: *Deleted

## 2020-03-24 NOTE — Telephone Encounter (Signed)
Left message on voicemail for patient to return call.  Need her availability to be scheduled for a CT Scan. Message routed to [facility]

## 2020-05-20 ENCOUNTER — Ambulatory Visit: Payer: No Typology Code available for payment source | Attending: Family Medicine | Admitting: Family Medicine

## 2020-05-20 ENCOUNTER — Other Ambulatory Visit: Payer: Self-pay | Admitting: Family Medicine

## 2020-05-20 ENCOUNTER — Encounter: Payer: Self-pay | Admitting: Family Medicine

## 2020-05-20 ENCOUNTER — Other Ambulatory Visit: Payer: Self-pay

## 2020-05-20 VITALS — BP 142/89 | HR 86 | Temp 98.0°F | Ht 67.0 in | Wt 125.4 lb

## 2020-05-20 DIAGNOSIS — E785 Hyperlipidemia, unspecified: Secondary | ICD-10-CM

## 2020-05-20 DIAGNOSIS — R7303 Prediabetes: Secondary | ICD-10-CM | POA: Diagnosis not present

## 2020-05-20 DIAGNOSIS — I10 Essential (primary) hypertension: Secondary | ICD-10-CM

## 2020-05-20 DIAGNOSIS — J438 Other emphysema: Secondary | ICD-10-CM

## 2020-05-20 MED ORDER — ATORVASTATIN CALCIUM 20 MG PO TABS: 20.0000 mg | ORAL_TABLET | Freq: Every day | ORAL | 3 refills | Status: DC

## 2020-05-20 MED ORDER — AMLODIPINE BESYLATE 10 MG PO TABS: 10.0000 mg | ORAL_TABLET | Freq: Every day | ORAL | 3 refills | Status: DC

## 2020-05-20 NOTE — Patient Instructions (Signed)
DASH Eating Plan DASH stands for "Dietary Approaches to Stop Hypertension." The DASH eating plan is a healthy eating plan that has been shown to reduce high blood pressure (hypertension). It may also reduce your risk for type 2 diabetes, heart disease, and stroke. The DASH eating plan may also help with weight loss. What are tips for following this plan?  General guidelines  Avoid eating more than 2,300 mg (milligrams) of salt (sodium) a day. If you have hypertension, you may need to reduce your sodium intake to 1,500 mg a day.  Limit alcohol intake to no more than 1 drink a day for nonpregnant women and 2 drinks a day for men. One drink equals 12 oz of beer, 5 oz of wine, or 1 oz of hard liquor.  Work with your health care provider to maintain a healthy body weight or to lose weight. Ask what an ideal weight is for you.  Get at least 30 minutes of exercise that causes your heart to beat faster (aerobic exercise) most days of the week. Activities may include walking, swimming, or biking.  Work with your health care provider or diet and nutrition specialist (dietitian) to adjust your eating plan to your individual calorie needs. Reading food labels   Check food labels for the amount of sodium per serving. Choose foods with less than 5 percent of the Daily Value of sodium. Generally, foods with less than 300 mg of sodium per serving fit into this eating plan.  To find whole grains, look for the word "whole" as the first word in the ingredient list. Shopping  Buy products labeled as "low-sodium" or "no salt added."  Buy fresh foods. Avoid canned foods and premade or frozen meals. Cooking  Avoid adding salt when cooking. Use salt-free seasonings or herbs instead of table salt or sea salt. Check with your health care provider or pharmacist before using salt substitutes.  Do not fry foods. Cook foods using healthy methods such as baking, boiling, grilling, and broiling instead.  Cook with  heart-healthy oils, such as olive, canola, soybean, or sunflower oil. Meal planning  Eat a balanced diet that includes: ? 5 or more servings of fruits and vegetables each day. At each meal, try to fill half of your plate with fruits and vegetables. ? Up to 6-8 servings of whole grains each day. ? Less than 6 oz of lean meat, poultry, or fish each day. A 3-oz serving of meat is about the same size as a deck of cards. One egg equals 1 oz. ? 2 servings of low-fat dairy each day. ? A serving of nuts, seeds, or beans 5 times each week. ? Heart-healthy fats. Healthy fats called Omega-3 fatty acids are found in foods such as flaxseeds and coldwater fish, like sardines, salmon, and mackerel.  Limit how much you eat of the following: ? Canned or prepackaged foods. ? Food that is high in trans fat, such as fried foods. ? Food that is high in saturated fat, such as fatty meat. ? Sweets, desserts, sugary drinks, and other foods with added sugar. ? Full-fat dairy products.  Do not salt foods before eating.  Try to eat at least 2 vegetarian meals each week.  Eat more home-cooked food and less restaurant, buffet, and fast food.  When eating at a restaurant, ask that your food be prepared with less salt or no salt, if possible. What foods are recommended? The items listed may not be a complete list. Talk with your dietitian about   what dietary choices are best for you. Grains Whole-grain or whole-wheat bread. Whole-grain or whole-wheat pasta. Brown rice. Oatmeal. Quinoa. Bulgur. Whole-grain and low-sodium cereals. Pita bread. Low-fat, low-sodium crackers. Whole-wheat flour tortillas. Vegetables Fresh or frozen vegetables (raw, steamed, roasted, or grilled). Low-sodium or reduced-sodium tomato and vegetable juice. Low-sodium or reduced-sodium tomato sauce and tomato paste. Low-sodium or reduced-sodium canned vegetables. Fruits All fresh, dried, or frozen fruit. Canned fruit in natural juice (without  added sugar). Meat and other protein foods Skinless chicken or turkey. Ground chicken or turkey. Pork with fat trimmed off. Fish and seafood. Egg whites. Dried beans, peas, or lentils. Unsalted nuts, nut butters, and seeds. Unsalted canned beans. Lean cuts of beef with fat trimmed off. Low-sodium, lean deli meat. Dairy Low-fat (1%) or fat-free (skim) milk. Fat-free, low-fat, or reduced-fat cheeses. Nonfat, low-sodium ricotta or cottage cheese. Low-fat or nonfat yogurt. Low-fat, low-sodium cheese. Fats and oils Soft margarine without trans fats. Vegetable oil. Low-fat, reduced-fat, or light mayonnaise and salad dressings (reduced-sodium). Canola, safflower, olive, soybean, and sunflower oils. Avocado. Seasoning and other foods Herbs. Spices. Seasoning mixes without salt. Unsalted popcorn and pretzels. Fat-free sweets. What foods are not recommended? The items listed may not be a complete list. Talk with your dietitian about what dietary choices are best for you. Grains Baked goods made with fat, such as croissants, muffins, or some breads. Dry pasta or rice meal packs. Vegetables Creamed or fried vegetables. Vegetables in a cheese sauce. Regular canned vegetables (not low-sodium or reduced-sodium). Regular canned tomato sauce and paste (not low-sodium or reduced-sodium). Regular tomato and vegetable juice (not low-sodium or reduced-sodium). Pickles. Olives. Fruits Canned fruit in a light or heavy syrup. Fried fruit. Fruit in cream or butter sauce. Meat and other protein foods Fatty cuts of meat. Ribs. Fried meat. Bacon. Sausage. Bologna and other processed lunch meats. Salami. Fatback. Hotdogs. Bratwurst. Salted nuts and seeds. Canned beans with added salt. Canned or smoked fish. Whole eggs or egg yolks. Chicken or turkey with skin. Dairy Whole or 2% milk, cream, and half-and-half. Whole or full-fat cream cheese. Whole-fat or sweetened yogurt. Full-fat cheese. Nondairy creamers. Whipped toppings.  Processed cheese and cheese spreads. Fats and oils Butter. Stick margarine. Lard. Shortening. Ghee. Bacon fat. Tropical oils, such as coconut, palm kernel, or palm oil. Seasoning and other foods Salted popcorn and pretzels. Onion salt, garlic salt, seasoned salt, table salt, and sea salt. Worcestershire sauce. Tartar sauce. Barbecue sauce. Teriyaki sauce. Soy sauce, including reduced-sodium. Steak sauce. Canned and packaged gravies. Fish sauce. Oyster sauce. Cocktail sauce. Horseradish that you find on the shelf. Ketchup. Mustard. Meat flavorings and tenderizers. Bouillon cubes. Hot sauce and Tabasco sauce. Premade or packaged marinades. Premade or packaged taco seasonings. Relishes. Regular salad dressings. Where to find more information:  National Heart, Lung, and Blood Institute: www.nhlbi.nih.gov  American Heart Association: www.heart.org Summary  The DASH eating plan is a healthy eating plan that has been shown to reduce high blood pressure (hypertension). It may also reduce your risk for type 2 diabetes, heart disease, and stroke.  With the DASH eating plan, you should limit salt (sodium) intake to 2,300 mg a day. If you have hypertension, you may need to reduce your sodium intake to 1,500 mg a day.  When on the DASH eating plan, aim to eat more fresh fruits and vegetables, whole grains, lean proteins, low-fat dairy, and heart-healthy fats.  Work with your health care provider or diet and nutrition specialist (dietitian) to adjust your eating plan to your   individual calorie needs. This information is not intended to replace advice given to you by your health care provider. Make sure you discuss any questions you have with your health care provider. Document Revised: 05/31/2017 Document Reviewed: 06/11/2016 Elsevier Patient Education  2020 Elsevier Inc.  

## 2020-05-20 NOTE — Progress Notes (Signed)
Subjective:  Patient ID: Martha Koch, female    DOB: 06/18/1956  Age: 64 y.o. MRN: [id number]  CC: Hypertension   HPI Martha Koch is a 64 year old female patient of Dr Chapman Fitch with a history of hypertension, prediabetes, Emphysema (stable without medications), previous history of cervical cancer (status post total abdominal hysterectomy and salpingo-oophorectomy) who presents today for chronic disease management.  She is doing well on her antihypertensive and statin. Denies adverse effects from her medications For prediabetes she is on diet control. She has no additional concerns today.  Past Medical History:  Diagnosis Date  . Cervical cancer (HCC) cervical  . Hyperlipidemia   . Hypertension   . Radiation 06/02/14-07/13/14   pelvis 45 gray, vaginal cuff/parametrial boost to 50.4 gray  . Radiation 04/06/15-05/13/15   anterior abdominal wall 50.4 gray    Past Surgical History:  Procedure Laterality Date  . ABDOMINAL HYSTERECTOMY    . COLONOSCOPY  2011  . robotic radical hysterectomy, bso, pelvic lymphadenectomy Bilateral 04/21/14  . TUBAL LIGATION      Family History  Problem Relation Age of Onset  . Hypertension Father   . Cancer Other   . Diabetes Other   . Breast cancer Maternal Aunt        breast cancer, x 4 aunts   . Breast cancer Daughter   . Colon cancer Neg Hx   . Colon polyps Neg Hx   . Esophageal cancer Neg Hx   . Stomach cancer Neg Hx   . Rectal cancer Neg Hx     Allergies  Allergen Reactions  . Aspirin     REACTION: stomach  . Penicillins     REACTION: hives    Outpatient Medications Prior to Visit  Medication Sig Dispense Refill  . Ascorbic Acid (VITAMIN C) 1000 MG tablet Take 1,000 mg by mouth daily. Reported on 08/26/2015    . cholecalciferol (VITAMIN D) 1000 units tablet Take 1,000 Units by mouth daily.    Marland Kitchen ibuprofen (ADVIL) 600 MG tablet Take 1 tablet (600 mg total) by mouth every 6 (six) hours as needed for headache. 30 tablet 0  .  amLODipine (NORVASC) 10 MG tablet Take 1 tablet (10 mg total) by mouth daily. 90 tablet 3  . atorvastatin (LIPITOR) 20 MG tablet Take 1 tablet (20 mg total) by mouth daily. 90 tablet 3  . nicotine (NICODERM CQ - DOSED IN MG/24 HOURS) 21 mg/24hr patch Place 1 patch (21 mg total) onto the skin daily. (Patient not taking: Reported on 12/22/2019) 28 patch 1  . nicotine polacrilex (NICORETTE) 4 MG gum Take 1 each (4 mg total) by mouth as needed for smoking cessation. (Patient not taking: Reported on 12/22/2019) 100 tablet 0  . Vitamins/Minerals TABS Take by mouth. (Patient not taking: Reported on 01/05/2020)     Facility-Administered Medications Prior to Visit  Medication Dose Route Frequency Provider Last Rate Last Admin  . 0.9 %  sodium chloride infusion  500 mL Intravenous Once Nelida Meuse III, MD         ROS Review of Systems  Constitutional: Negative for activity change, appetite change and fatigue.  HENT: Negative for congestion, sinus pressure and sore throat.   Eyes: Negative for visual disturbance.  Respiratory: Negative for cough, chest tightness, shortness of breath and wheezing.   Cardiovascular: Negative for chest pain and palpitations.  Gastrointestinal: Negative for abdominal distention, abdominal pain and constipation.  Endocrine: Negative for polydipsia.  Genitourinary: Negative for dysuria and frequency.  Musculoskeletal:  Negative for arthralgias and back pain.  Skin: Negative for rash.  Neurological: Negative for tremors, light-headedness and numbness.  Hematological: Does not bruise/bleed easily.  Psychiatric/Behavioral: Negative for agitation and behavioral problems.    Objective:  BP (!) 142/89   Pulse 86   Temp 98 F (36.7 C) (Oral)   Ht 5\' 7"  (1.702 m)   Wt 125 lb 6.4 oz (56.9 kg)   SpO2 98%   BMI 19.64 kg/m   BP/Weight 05/20/2020 01/05/2020 6/96/2952  Systolic BP 841 324 -  Diastolic BP 89 73 -  Wt. (Lbs) 125.4 127 127.2  BMI 19.64 19.89 19.92       Physical Exam Constitutional:      Appearance: She is well-developed.  Neck:     Vascular: No JVD.  Cardiovascular:     Rate and Rhythm: Normal rate.     Heart sounds: Normal heart sounds. No murmur heard.   Pulmonary:     Effort: Pulmonary effort is normal.     Breath sounds: Normal breath sounds. No wheezing or rales.  Chest:     Chest wall: No tenderness.  Abdominal:     General: Bowel sounds are normal. There is no distension.     Palpations: Abdomen is soft. There is no mass.     Tenderness: There is no abdominal tenderness.  Musculoskeletal:        General: Normal range of motion.     Right lower leg: No edema.     Left lower leg: No edema.  Neurological:     Mental Status: She is alert and oriented to person, place, and time.  Psychiatric:        Mood and Affect: Mood normal.     CMP Latest Ref Rng & Units 11/20/2019 05/27/2019 05/22/2019  Glucose 65 - 99 mg/dL - 116(H) 80  BUN 8 - 27 mg/dL - 13 18  Creatinine 0.57 - 1.00 mg/dL - 0.89 1.10(H)  Sodium 134 - 144 mmol/L - 142 144  Potassium 3.5 - 5.2 mmol/L - 5.0 5.5(H)  Chloride 96 - 106 mmol/L - 105 106  CO2 20 - 29 mmol/L - 20 22  Calcium 8.7 - 10.3 mg/dL - 9.4 9.8  Total Protein 6.0 - 8.5 g/dL 7.2 - -  Total Bilirubin 0.0 - 1.2 mg/dL 0.3 - -  Alkaline Phos 48 - 121 IU/L 80 - -  AST 0 - 40 IU/L 17 - -  ALT 0 - 32 IU/L 10 - -    Lipid Panel     Component Value Date/Time   CHOL 197 11/20/2019 1006   TRIG 56 11/20/2019 1006   HDL 77 11/20/2019 1006   CHOLHDL 2.6 11/20/2019 1006   CHOLHDL 2.9 08/26/2015 0945   VLDL 12 08/26/2015 0945   LDLCALC 110 (H) 11/20/2019 1006    CBC    Component Value Date/Time   WBC 3.1 (L) 11/18/2014 0902   WBC 7.0 02/22/2014 2359   RBC 4.72 11/18/2014 0902   RBC 4.98 02/22/2014 2359   HGB 12.8 11/18/2014 0902   HCT 38.9 11/18/2014 0902   PLT 255 11/18/2014 0902   MCV 82.4 11/18/2014 0902   MCH 27.1 11/18/2014 0902   MCH 26.3 02/22/2014 2359   MCHC 32.9  11/18/2014 0902   MCHC 33.2 02/22/2014 2359   RDW 13.2 11/18/2014 0902   LYMPHSABS 0.9 11/18/2014 0902   MONOABS 0.3 11/18/2014 0902   EOSABS 0.1 11/18/2014 0902   BASOSABS 0.0 11/18/2014 0902    Lab Results  Component Value Date   HGBA1C 5.8 (A) 05/22/2019    Assessment & Plan:  1. Essential hypertension Controlled Counseled on blood pressure goal of less than 130/80, low-sodium, DASH diet, medication compliance, 150 minutes of moderate intensity exercise per week. Discussed medication compliance, adverse effects. - Basic Metabolic Panel - amLODipine (NORVASC) 10 MG tablet; Take 1 tablet (10 mg total) by mouth daily.  Dispense: 90 tablet; Refill: 3  2. Dyslipidemia Stable - atorvastatin (LIPITOR) 20 MG tablet; Take 1 tablet (20 mg total) by mouth daily.  Dispense: 90 tablet; Refill: 3  3. Other emphysema ([place]) Controlled without medication  4.  Prediabetes Diet controlled  Health Care Maintenance: Declines DEXA scan Meds ordered this encounter  Medications  . amLODipine (NORVASC) 10 MG tablet    Sig: Take 1 tablet (10 mg total) by mouth daily.    Dispense:  90 tablet    Refill:  3    Patient will call when refill needed  . atorvastatin (LIPITOR) 20 MG tablet    Sig: Take 1 tablet (20 mg total) by mouth daily.    Dispense:  90 tablet    Refill:  3    Patient will call when refill needed    Follow-up: Return in about 6 months (around 11/17/2020) for Chronic disease management.       Charlott Rakes, MD, FAAFP. [facility] and [place], [place]   05/20/2020, 2:26 PM

## 2020-05-21 LAB — BASIC METABOLIC PANEL
BUN/Creatinine Ratio: 14 (ref 12–28)
BUN: 13 mg/dL (ref 8–27)
CO2: 24 mmol/L (ref 20–29)
Calcium: 9.5 mg/dL (ref 8.7–10.3)
Chloride: 104 mmol/L (ref 96–106)
Creatinine, Ser: 0.96 mg/dL (ref 0.57–1.00)
GFR calc Af Amer: 72 mL/min/{1.73_m2} (ref >59)
GFR calc non Af Amer: 62 mL/min/{1.73_m2} (ref >59)
Glucose: 94 mg/dL (ref 65–99)
Potassium: 4.5 mmol/L (ref 3.5–5.2)
Sodium: 143 mmol/L (ref 134–144)

## 2020-05-23 ENCOUNTER — Telehealth: Payer: Self-pay

## 2020-05-23 MED FILL — ATORVASTATIN CALCIUM 20 MG: 20 | 30 days supply | Qty: 30 | Fill #0

## 2020-05-23 NOTE — Telephone Encounter (Signed)
-----   Message from Charlott Rakes, MD sent at 05/22/2020  1:35 PM EST ----- Please inform the patient that labs are normal. Thank you.

## 2020-05-23 NOTE — Telephone Encounter (Signed)
Patient name and DOB has been verified Patient was informed of lab results. Patient had no questions.  

## 2020-06-07 ENCOUNTER — Telehealth: Payer: Self-pay | Admitting: *Deleted

## 2020-06-07 NOTE — Telephone Encounter (Signed)
-----   Message from Antony Blackbird, MD sent at 03/23/2020  5:25 PM EDT ----- Regarding: RE: Screening for lung cancer for uninsured Order placed and patient appears to know have insurance so the test should no longer need to be routed through pulmonology ----- Message ----- From: Carilyn Goodpasture, RN Sent: 03/23/2020   4:20 PM EDT To: Antony Blackbird, MD Subject: FW: Screening for lung cancer for uninsured    I will be able to assist with getting the patient scheduled for the CT scan once the order is place for the CT and getting the prior auth completed but first the order in which you request needs to be placed.    It would help please going forward, to send messages as an encounter/ telephone call as opposed as a staff message.  ----- Message ----- From: Antony Blackbird, MD Sent: 03/23/2020   4:15 PM EDT To: Carilyn Goodpasture, RN Subject: RE: Screening for lung cancer for uninsured    This message is from May- can you check chart or with radiology- message also mentions how to get this wet up ----- Message ----- From: Carilyn Goodpasture, RN Sent: 03/23/2020   2:16 PM EDT To: Elsie Stain, MD, Antony Blackbird, MD Subject: FW: Screening for lung cancer for uninsured    Hey.... I am not sure where we are at this point on this request.  Does this patient have an order for the CT scan? Screening for lung cancer for uninsured ----- Message ----- From: Antony Blackbird, MD Sent: 11/23/2019   9:00 PM EDT To: Carilyn Goodpasture, RN Subject: FW: Screening for lung cancer for uninsured    Can you help set this patient up with a lung CT to screen for Lung cancer? See Dr. Bettina Gavia message below ----- Message ----- From: Elsie Stain, MD Sent: 11/23/2019   3:51 PM EDT To: Antony Blackbird, MD Subject: RE: Screening for lung cancer for uninsured    Have your RN send the order through pulmonary at [place]:  Eric Form RN ----- Message ----- From: Antony Blackbird, MD Sent: 11/20/2019   9:56 AM EDT To: Elsie Stain, MD Subject: Screening for lung cancer for uninsured        Can you tell me the information or who I need to contact to see if patient can have a screening CT scan for free or reduced cost as she is uninsured

## 2020-06-07 NOTE — Telephone Encounter (Signed)
Left another voicemail for patient to return call.  Need to schedule CT Scan / chest. Calling patient for schedule availability.

## 2020-12-12 ENCOUNTER — Other Ambulatory Visit: Payer: Self-pay

## 2020-12-12 ENCOUNTER — Encounter (HOSPITAL_COMMUNITY): Payer: Self-pay

## 2020-12-12 ENCOUNTER — Ambulatory Visit (HOSPITAL_COMMUNITY)
Admission: EM | Admit: 2020-12-12 | Discharge: 2020-12-12 | Disposition: A | Discharge status: Home / Self Care | Payer: No Typology Code available for payment source | Attending: Family Medicine | Admitting: Family Medicine

## 2020-12-12 DIAGNOSIS — M5416 Radiculopathy, lumbar region: Secondary | ICD-10-CM | POA: Diagnosis not present

## 2020-12-12 MED ORDER — PREDNISONE 5 MG PO TABS
ORAL_TABLET | ORAL | 0 refills | Status: DC
Start: 2020-12-12 — End: 2021-04-18
  Filled 2020-12-12: qty 21, 6d supply, fill #0

## 2020-12-12 NOTE — Discharge Instructions (Signed)
Please try the medicine  Please try heat  Please try the exercises  Please follow up if your symptoms fail to improve.

## 2020-12-12 NOTE — ED Triage Notes (Signed)
Pt reports right leg pain x 3 months. Pian is worse when walking. Tylenol gives no relief.

## 2020-12-12 NOTE — ED Provider Notes (Signed)
Martha Koch    CSN: [id number] Arrival date & time: 12/12/20  1714      History   Chief Complaint Chief Complaint  Patient presents with   Leg Pain    HPI Martha Koch is a 65 y.o. female presenting with right leg pain.  The pain has been ongoing for 3 months.  Denies any history of similar pain.  Has not tried any medications.  The pain is staying the same.  It is worse with walking..   Independent review of the CT abdomen pelvis from 2018 shows no significant degenerative changes in the lumbar spine or in the right hip.  HPI  Past Medical History:  Diagnosis Date   Cervical cancer ([place]) cervical   Hyperlipidemia    Hypertension    Radiation 06/02/14-07/13/14   pelvis 45 gray, vaginal cuff/parametrial boost to 50.4 gray   Radiation 04/06/15-05/13/15   anterior abdominal wall 50.4 gray    Patient Active Problem List   Diagnosis Date Noted   History of hyperlipidemia 05/21/2017   Pancreatic cyst 09/18/2016   COPD (chronic obstructive pulmonary disease) ([place]) 04/23/2016   Mild airflow obstruction on pulmonary function test 03/18/2016   Abnormal PFT 03/18/2016   Diffusion capacity of lung (dl), decreased 03/18/2016   Abnormal CT of the chest 02/16/2016   Pulmonary nodules 02/16/2016   Recurrent cervical cancer ([place]) 03/04/2015   Abdominal wall mass of left upper quadrant 01/13/2015   Cervical cancer, FIGO stage IB1 ([place]) 11/20/2014   Continuous tobacco abuse 11/20/2014   Dense breast tissue 11/20/2014   Cervical cancer ([place]) 04/12/2014   Ovarian mass 04/12/2014   Loss of weight 03/02/2014   Hypertension 03/02/2014    Past Surgical History:  Procedure Laterality Date   ABDOMINAL HYSTERECTOMY     COLONOSCOPY  2011   robotic radical hysterectomy, bso, pelvic lymphadenectomy Bilateral 04/21/14   TUBAL LIGATION      OB History     Gravida  3   Para  3   Term  3   Preterm      AB      Living  3      SAB      IAB      Ectopic       Multiple      Live Births               Home Medications    Prior to Admission medications   Medication Sig Start Date End Date Taking? Authorizing Provider  acetaminophen (TYLENOL) 500 MG tablet Take 500 mg by mouth every 6 (six) hours as needed.   Yes [provider]  predniSONE (DELTASONE) 5 MG tablet Take 6 pills for first day, 5 pills second day, 4 pills third day, 3 pills fourth day, 2 pills the fifth day, and 1 pill sixth day. 12/12/20  Yes Rosemarie Ax, MD  amLODipine (NORVASC) 10 MG tablet Take 1 tablet (10 mg total) by mouth daily. 05/20/20   Charlott Rakes, MD  Ascorbic Acid (VITAMIN C) 1000 MG tablet Take 1,000 mg by mouth daily. Reported on 08/26/2015    [provider]  atorvastatin (LIPITOR) 20 MG tablet TAKE 1 TABLET (20 MG TOTAL) BY MOUTH DAILY. 05/20/20 05/20/21  Charlott Rakes, MD  cholecalciferol (VITAMIN D) 1000 units tablet Take 1,000 Units by mouth daily.    [provider]  ibuprofen (ADVIL) 600 MG tablet Take 1 tablet (600 mg total) by mouth every 6 (six) hours as needed for  headache. 11/03/18   Duffy Bruce, MD    Family History Family History  Problem Relation Age of Onset   Hypertension Father    Cancer Other    Diabetes Other    Breast cancer Maternal Aunt        breast cancer, x 4 aunts    Breast cancer Daughter    Colon cancer Neg Hx    Colon polyps Neg Hx    Esophageal cancer Neg Hx    Stomach cancer Neg Hx    Rectal cancer Neg Hx     Social History Social History   Tobacco Use   Smoking status: Every Day    Packs/day: 0.50    Years: 30.00    Pack years: 15.00    Types: Cigarettes   Smokeless tobacco: Never   Tobacco comments:    smokes 2-3 a day  Vaping Use   Vaping Use: Never used  Substance Use Topics   Alcohol use: No    Alcohol/week: 0.0 standard drinks   Drug use: No     Allergies   Aspirin and Penicillins   Review of Systems Review of Systems See HPI  Physical Exam Triage  Vital Signs ED Triage Vitals  Enc Vitals Group     BP 12/12/20 1911 (!) 159/99     Pulse Rate 12/12/20 1911 83     Resp 12/12/20 1911 18     Temp 12/12/20 1911 99 F (37.2 C)     Temp Source 12/12/20 1911 Oral     SpO2 12/12/20 1911 99 %     Weight --      Height --      Head Circumference --      Peak Flow --      Pain Score 12/12/20 1909 8     Pain Loc --      Pain Edu? --      Excl. in [place]? --    No data found.  Updated Vital Signs BP (!) 159/99 (BP Location: Left Arm)   Pulse 83   Temp 99 F (37.2 C) (Oral)   Resp 18   SpO2 99%   Visual Acuity Right Eye Distance:   Left Eye Distance:   Bilateral Distance:    Right Eye Near:   Left Eye Near:    Bilateral Near:     Physical Exam Gen: NAD, alert, cooperative with exam, well-appearing Neuro: normal tone, normal sensation to touch Psych:  normal insight, alert and oriented MSK:  Normal gait. Normal strength resistance with flexion. Neurovascular intact   UC Treatments / Results  Labs (all labs ordered are listed, but only abnormal results are displayed) Labs Reviewed - No data to display  EKG   Radiology No results found.  Procedures Procedures (including critical care time)  Medications Ordered in UC Medications - No data to display  Initial Impression / Assessment and Plan / UC Course  I have reviewed the triage vital signs and the nursing notes.  Pertinent labs & imaging results that were available during my care of the patient were reviewed by me and considered in my medical decision making (see chart for details).     Martha Koch is an 65 year old female is presenting with right hip pain.  Symptoms seem to be more radicular versus IT band syndrome.  Is worse with walking and likely has weakness with hip abduction.  Provided prednisone.  Counseled on home exercise therapy and supportive care.  Given indications of follow-up.  Final  Clinical Impressions(s) / UC Diagnoses   Final diagnoses:   Lumbar radiculopathy     Discharge Instructions      Please try the medicine  Please try heat  Please try the exercises  Please follow up if your symptoms fail to improve.      ED Prescriptions     Medication Sig Dispense Auth. Provider   predniSONE (DELTASONE) 5 MG tablet Take 6 pills for first day, 5 pills second day, 4 pills third day, 3 pills fourth day, 2 pills the fifth day, and 1 pill sixth day. 21 tablet Rosemarie Ax, MD      PDMP not reviewed this encounter.   Rosemarie Ax, MD 12/12/20 1950

## 2020-12-13 ENCOUNTER — Other Ambulatory Visit: Payer: Self-pay

## 2021-01-25 ENCOUNTER — Other Ambulatory Visit: Payer: Self-pay | Admitting: Family Medicine

## 2021-01-25 ENCOUNTER — Other Ambulatory Visit: Payer: Self-pay

## 2021-01-25 DIAGNOSIS — Z1231 Encounter for screening mammogram for malignant neoplasm of breast: Secondary | ICD-10-CM

## 2021-01-25 DIAGNOSIS — I10 Essential (primary) hypertension: Secondary | ICD-10-CM

## 2021-01-25 DIAGNOSIS — E785 Hyperlipidemia, unspecified: Secondary | ICD-10-CM

## 2021-01-25 MED ORDER — ATORVASTATIN CALCIUM 20 MG PO TABS
ORAL_TABLET | Freq: Every day | ORAL | 0 refills | Status: DC
  Filled 2021-01-25: qty 30, 30d supply, fill #0

## 2021-01-25 MED ORDER — AMLODIPINE BESYLATE 10 MG PO TABS: 10.0000 mg | ORAL_TABLET | Freq: Every day | ORAL | 0 refills | Status: DC

## 2021-01-25 NOTE — Telephone Encounter (Signed)
Notes to clinic: Patient is not scheduled until 04/19/2021 Review for medication until that time    Requested Prescriptions  Pending Prescriptions Disp Refills   amLODipine (NORVASC) 10 MG tablet 90 tablet 3    Sig: Take 1 tablet (10 mg total) by mouth daily.      Cardiovascular:  Calcium Channel Blockers Failed - 01/25/2021 10:38 AM      Failed - Last BP in normal range    BP Readings from Last 1 Encounters:  12/12/20 (!) 159/99          Failed - Valid encounter within last 6 months    Recent Outpatient Visits           8 months ago Other emphysema [place])   [place] Charlott Rakes, MD   1 year ago Encounter for smoking cessation counseling   [place], RPH-CPP   1 year ago Essential hypertension   [place], MD   1 year ago Essential hypertension   [place], Stephen L, RPH-CPP   1 year ago Essential hypertension   [place], Jarome Matin, RPH-CPP       Future Appointments             In 2 months Charlott Rakes, MD [place]               atorvastatin (LIPITOR) 20 MG tablet 90 tablet 3    Sig: TAKE 1 TABLET (20 MG TOTAL) BY MOUTH DAILY.      Cardiovascular:  Antilipid - Statins Failed - 01/25/2021 10:38 AM      Failed - Total Cholesterol in normal range and within 360 days    Cholesterol, Total  Date Value Ref Range Status  11/20/2019 197 100 - 199 mg/dL Final          Failed - LDL in normal range and within 360 days    LDL Chol Calc (NIH)  Date Value Ref Range Status  11/20/2019 110 (H) 0 - 99 mg/dL Final          Failed - HDL in normal range and within 360 days    HDL  Date Value Ref Range Status  11/20/2019 77 >39 mg/dL Final          Failed - Triglycerides in normal range and within 360 days     Triglycerides  Date Value Ref Range Status  11/20/2019 56 0 - 149 mg/dL Final          Passed - Patient is not pregnant      Passed - Valid encounter within last 12 months    Recent Outpatient Visits           8 months ago Other emphysema ([place])   [place], Charlane Ferretti, MD   1 year ago Encounter for smoking cessation counseling   [place], Jarome Matin, RPH-CPP   1 year ago Essential hypertension   [place], MD   1 year ago Essential hypertension   [place], Stephen L, RPH-CPP   1 year ago Essential hypertension   [place], Stephen L, RPH-CPP       Future Appointments  In 2 months Charlott Rakes, MD [place]

## 2021-01-25 NOTE — Telephone Encounter (Signed)
Medication Refill - Medication: amLODipine (NORVASC) 10 MG tablet   [facility] -   atorvastatin (LIPITOR) 20 MG tablet    - [facility]   Has the patient contacted their pharmacy? Yes.   (Agent: If no, request that the patient contact the pharmacy for the refill.) (Agent: If yes, when and what did the pharmacy advise?)  Preferred Pharmacy (with phone number or street name):  [facility] ([place]), [place] - [address] Phone:  [phone]  Fax:  [place] [facility] [place] Phone:  [phone]  Fax:  [phone]      Agent: Please be advised that RX refills may take up to 3 business days. We ask that you follow-up with your pharmacy.

## 2021-01-26 ENCOUNTER — Other Ambulatory Visit: Payer: Self-pay

## 2021-01-26 ENCOUNTER — Ambulatory Visit
Admission: RE | Admit: 2021-01-26 | Discharge: 2021-01-26 | Disposition: A | Discharge status: Home / Self Care | Payer: No Typology Code available for payment source | Source: Ambulatory Visit | Attending: Family Medicine | Admitting: Family Medicine

## 2021-01-26 DIAGNOSIS — Z1231 Encounter for screening mammogram for malignant neoplasm of breast: Secondary | ICD-10-CM

## 2021-02-10 ENCOUNTER — Encounter (HOSPITAL_COMMUNITY): Payer: Self-pay | Admitting: Emergency Medicine

## 2021-02-10 ENCOUNTER — Other Ambulatory Visit: Payer: Self-pay

## 2021-02-10 ENCOUNTER — Emergency Department (HOSPITAL_BASED_OUTPATIENT_CLINIC_OR_DEPARTMENT_OTHER)
Admit: 2021-02-10 | Discharge: 2021-02-10 | Disposition: A | Discharge status: Home / Self Care | Payer: Self-pay | Attending: Emergency Medicine | Admitting: Emergency Medicine

## 2021-02-10 ENCOUNTER — Emergency Department (HOSPITAL_COMMUNITY): Payer: Self-pay

## 2021-02-10 ENCOUNTER — Emergency Department (HOSPITAL_COMMUNITY)
Admission: EM | Admit: 2021-02-10 | Discharge: 2021-02-10 | Disposition: A | Discharge status: Home / Self Care | Payer: Self-pay | Attending: Emergency Medicine | Admitting: Emergency Medicine

## 2021-02-10 DIAGNOSIS — Z79899 Other long term (current) drug therapy: Secondary | ICD-10-CM | POA: Insufficient documentation

## 2021-02-10 DIAGNOSIS — Z8541 Personal history of malignant neoplasm of cervix uteri: Secondary | ICD-10-CM | POA: Insufficient documentation

## 2021-02-10 DIAGNOSIS — F17210 Nicotine dependence, cigarettes, uncomplicated: Secondary | ICD-10-CM | POA: Insufficient documentation

## 2021-02-10 DIAGNOSIS — M7989 Other specified soft tissue disorders: Secondary | ICD-10-CM

## 2021-02-10 DIAGNOSIS — M79604 Pain in right leg: Secondary | ICD-10-CM | POA: Insufficient documentation

## 2021-02-10 DIAGNOSIS — I10 Essential (primary) hypertension: Secondary | ICD-10-CM | POA: Insufficient documentation

## 2021-02-10 DIAGNOSIS — J449 Chronic obstructive pulmonary disease, unspecified: Secondary | ICD-10-CM | POA: Insufficient documentation

## 2021-02-10 NOTE — ED Provider Notes (Signed)
[facility] DEPT Provider Note   CSN: [id number] Arrival date & time: 02/10/21  1206     History Chief Complaint  Patient presents with   Leg Pain    Martha Koch is a 65 y.o. female presenting for evaluation of right leg pain.  Patient states she has had pain in her right leg for 4 months.  There was no precipitating event.  It is not worsening, but is also not improving.  Pain is present only when she ambulates, not when she is sitting or just standing.  It is worse after she ambulates for longer period of time.  No numbness or tingling.  No fall, trauma, or injury.  No back pain.  She has not taken anything for it except a prednisone pack that was prescribed when she first developed the pain.   Additional history taken chart reviewed.  History of cervical cancer status postradiation, hypertension, hyperlipidemia.  HPI     Past Medical History:  Diagnosis Date   Cervical cancer ([place]) cervical   Hyperlipidemia    Hypertension    Radiation 06/02/14-07/13/14   pelvis 45 gray, vaginal cuff/parametrial boost to 50.4 gray   Radiation 04/06/15-05/13/15   anterior abdominal wall 50.4 gray    Patient Active Problem List   Diagnosis Date Noted   History of hyperlipidemia 05/21/2017   Pancreatic cyst 09/18/2016   COPD (chronic obstructive pulmonary disease) ([place]) 04/23/2016   Mild airflow obstruction on pulmonary function test 03/18/2016   Abnormal PFT 03/18/2016   Diffusion capacity of lung (dl), decreased 03/18/2016   Abnormal CT of the chest 02/16/2016   Pulmonary nodules 02/16/2016   Recurrent cervical cancer ([place]) 03/04/2015   Abdominal wall mass of left upper quadrant 01/13/2015   Cervical cancer, FIGO stage IB1 ([place]) 11/20/2014   Continuous tobacco abuse 11/20/2014   Dense breast tissue 11/20/2014   Cervical cancer ([place]) 04/12/2014   Ovarian mass 04/12/2014   Loss of weight 03/02/2014   Hypertension 03/02/2014    Past Surgical  History:  Procedure Laterality Date   ABDOMINAL HYSTERECTOMY     COLONOSCOPY  2011   robotic radical hysterectomy, bso, pelvic lymphadenectomy Bilateral 04/21/14   TUBAL LIGATION       OB History     Gravida  3   Para  3   Term  3   Preterm      AB      Living  3      SAB      IAB      Ectopic      Multiple      Live Births              Family History  Problem Relation Age of Onset   Hypertension Father    Cancer Other    Diabetes Other    Breast cancer Maternal Aunt        breast cancer, x 4 aunts    Breast cancer Daughter    Colon cancer Neg Hx    Colon polyps Neg Hx    Esophageal cancer Neg Hx    Stomach cancer Neg Hx    Rectal cancer Neg Hx     Social History   Tobacco Use   Smoking status: Every Day    Packs/day: 0.50    Years: 30.00    Pack years: 15.00    Types: Cigarettes   Smokeless tobacco: Never   Tobacco comments:    smokes 2-3 a day  Vaping  Use   Vaping Use: Never used  Substance Use Topics   Alcohol use: No    Alcohol/week: 0.0 standard drinks   Drug use: No    Home Medications Prior to Admission medications   Medication Sig Start Date End Date Taking? Authorizing Provider  acetaminophen (TYLENOL) 500 MG tablet Take 500 mg by mouth every 6 (six) hours as needed.    [provider]  amLODipine (NORVASC) 10 MG tablet Take 1 tablet (10 mg total) by mouth daily. 01/25/21   Charlott Rakes, MD  Ascorbic Acid (VITAMIN C) 1000 MG tablet Take 1,000 mg by mouth daily. Reported on 08/26/2015    [provider]  atorvastatin (LIPITOR) 20 MG tablet TAKE 1 TABLET (20 MG TOTAL) BY MOUTH DAILY. 01/25/21 01/25/22  Charlott Rakes, MD  cholecalciferol (VITAMIN D) 1000 units tablet Take 1,000 Units by mouth daily.    [provider]  ibuprofen (ADVIL) 600 MG tablet Take 1 tablet (600 mg total) by mouth every 6 (six) hours as needed for headache. 11/03/18   Duffy Bruce, MD  predniSONE (DELTASONE) 5 MG tablet Take 6  pills for first day, 5 pills second day, 4 pills third day, 3 pills fourth day, 2 pills the fifth day, and 1 pill sixth day. 12/12/20   Rosemarie Ax, MD    Allergies    Aspirin and Penicillins  Review of Systems   Review of Systems  Musculoskeletal:  Positive for myalgias.  All other systems reviewed and are negative.  Physical Exam Updated Vital Signs BP (!) 142/89   Pulse 82   Temp 98 F (36.7 C)   Resp 18   SpO2 100%   Physical Exam Vitals and nursing note reviewed.  Constitutional:      General: She is not in acute distress.    Appearance: She is well-developed.  HENT:     Head: Normocephalic and atraumatic.  Eyes:     Extraocular Movements: Extraocular movements intact.  Cardiovascular:     Rate and Rhythm: Normal rate.  Pulmonary:     Effort: Pulmonary effort is normal.  Abdominal:     General: There is no distension.  Musculoskeletal:        General: Normal range of motion.     Cervical back: Normal range of motion.     Comments: Right leg slightly atrophied when compared to the left.  Strength equal.  Patient able to ambulate without difficulty.  No focal tenderness palpation of the leg.  Pedal pulses 2+.  No tenderness palpation of back or buttock.  Skin:    General: Skin is warm.     Findings: No rash.  Neurological:     Mental Status: She is alert and oriented to person, place, and time.    ED Results / Procedures / Treatments   Labs (all labs ordered are listed, but only abnormal results are displayed) Labs Reviewed - No data to display  EKG None  Radiology DG Lumbar Spine Complete  Result Date: 02/10/2021 CLINICAL DATA:  Left leg pain EXAM: LUMBAR SPINE - COMPLETE 4+ VIEW COMPARISON:  CT abdomen/pelvis 09/12/2016 FINDINGS: There are 5 non-rib-bearing lumbar type vertebral bodies. Vertebral body heights are preserved. Alignment is normal. There is no evidence of acute fracture. The disc spaces are preserved. There is minimal degenerative  endplate change. There is mild multilevel facet arthropathy, most advanced in the lower lumbar spine. Left upper quadrant surgical clips are noted. There is calcified atherosclerotic plaque of the abdominal aorta. IMPRESSION: Mild  degenerative changes of the lumbar spine.  No acute findings. Electronically Signed   By: Valetta Mole M.D.   On: 02/10/2021 13:10    Procedures Procedures   Medications Ordered in ED Medications - No data to display  ED Course  I have reviewed the triage vital signs and the nursing notes.  Pertinent labs & imaging results that were available during my care of the patient were reviewed by me and considered in my medical decision making (see chart for details).    MDM Rules/Calculators/A&P                           Patient presenting for evaluation of right leg pain.  Has been going on for several months, is not worsening, but also not improving.  Present only with ambulation.  On exam, patient has no neurologic deficits.  No pain on my evaluation.  Patient able to ambulate with a normal gait.  However right leg is slightly atrophied when compared to the left, this may be due to patient favoring the right leg and therefore decreased muscle use.  However due to continued pain will obtain an ultrasound and then an x-ray of the low back due to patient's history of cancer.  X-ray viewed and independently interpreted by me, shows degenerative changes but no obvious abnormality of the bony back.  Ultrasound negative for DVT.  Discussed with attending, Dr. Roslynn Amble evaluated the patient.  Discussed findings with patient.  Discussed symptomatic management and follow-up with Ortho.  At this time, patient appears safe for discharge.  Return precautions given.  Patient states she understands and agrees to plan  Final Clinical Impression(s) / ED Diagnoses Final diagnoses:  Right leg pain    Rx / DC Orders ED Discharge Orders     None        Franchot Heidelberg,  PA-C 02/10/21 1416    Lucrezia Starch, MD 02/13/21 [phone]

## 2021-02-10 NOTE — Progress Notes (Signed)
Right lower extremity venous duplex completed. Refer to "CV Proc" under chart review to view preliminary results.  02/10/2021 1:51 PM Kelby Aline., MHA, RVT, RDCS, RDMS

## 2021-02-10 NOTE — ED Triage Notes (Signed)
Patient has experienced left leg pain which increases with activity since April. The left leg is visibly larger than the right. She also reports weakness in this leg. She is concerned she may have a blood clot.

## 2021-02-10 NOTE — Discharge Instructions (Addendum)
Take tylenol and/or ibuprofen as needed for pain.  Call the orthopedic doctor listed below to set up a follow up appointment and for further investigation into your symptoms.  Return to the ER if you develop fevers, numbness, loss of bowel or bladder control, or any new, worsening, or concerning symptoms.

## 2021-02-20 ENCOUNTER — Other Ambulatory Visit: Payer: Self-pay | Admitting: Orthopedic Surgery

## 2021-02-20 DIAGNOSIS — M5410 Radiculopathy, site unspecified: Secondary | ICD-10-CM

## 2021-02-28 ENCOUNTER — Ambulatory Visit
Admission: RE | Admit: 2021-02-28 | Discharge: 2021-02-28 | Disposition: A | Discharge status: Home / Self Care | Payer: No Typology Code available for payment source | Source: Ambulatory Visit | Attending: Orthopedic Surgery | Admitting: Orthopedic Surgery

## 2021-02-28 ENCOUNTER — Other Ambulatory Visit: Payer: Self-pay

## 2021-02-28 DIAGNOSIS — M5410 Radiculopathy, site unspecified: Secondary | ICD-10-CM

## 2021-03-02 ENCOUNTER — Other Ambulatory Visit: Payer: Self-pay

## 2021-03-22 ENCOUNTER — Other Ambulatory Visit: Payer: Self-pay | Admitting: Orthopedic Surgery

## 2021-03-22 DIAGNOSIS — M5416 Radiculopathy, lumbar region: Secondary | ICD-10-CM

## 2021-04-07 ENCOUNTER — Ambulatory Visit
Admission: RE | Admit: 2021-04-07 | Discharge: 2021-04-07 | Disposition: A | Discharge status: Home / Self Care | Payer: No Typology Code available for payment source | Source: Ambulatory Visit | Attending: Orthopedic Surgery | Admitting: Orthopedic Surgery

## 2021-04-07 ENCOUNTER — Other Ambulatory Visit: Payer: Self-pay

## 2021-04-07 DIAGNOSIS — M5416 Radiculopathy, lumbar region: Secondary | ICD-10-CM

## 2021-04-07 MED ORDER — IOPAMIDOL (ISOVUE-M 200) INJECTION 41%
1.0000 mL | Freq: Once | INTRAMUSCULAR | Status: AC
  Administered 2021-04-07: 1 mL via EPIDURAL

## 2021-04-07 MED ORDER — METHYLPREDNISOLONE ACETATE 40 MG/ML INJ SUSP (RADIOLOG
80.0000 mg | Freq: Once | INTRAMUSCULAR | Status: AC
  Administered 2021-04-07: 80 mg via EPIDURAL

## 2021-04-07 NOTE — Discharge Instructions (Signed)

## 2021-04-18 ENCOUNTER — Ambulatory Visit: Payer: Self-pay | Attending: Family Medicine | Admitting: Physician Assistant

## 2021-04-18 ENCOUNTER — Encounter: Payer: Self-pay | Admitting: Physician Assistant

## 2021-04-18 ENCOUNTER — Other Ambulatory Visit: Payer: Self-pay

## 2021-04-18 VITALS — BP 146/83 | HR 86 | Temp 98.2°F | Resp 18 | Ht 67.0 in | Wt 125.0 lb

## 2021-04-18 DIAGNOSIS — I10 Essential (primary) hypertension: Secondary | ICD-10-CM | POA: Insufficient documentation

## 2021-04-18 DIAGNOSIS — E785 Hyperlipidemia, unspecified: Secondary | ICD-10-CM

## 2021-04-18 DIAGNOSIS — Z681 Body mass index (BMI) 19 or less, adult: Secondary | ICD-10-CM

## 2021-04-18 DIAGNOSIS — R7303 Prediabetes: Secondary | ICD-10-CM | POA: Insufficient documentation

## 2021-04-18 LAB — POCT GLYCOSYLATED HEMOGLOBIN (HGB A1C): Hemoglobin A1C: 5.7 % — AB (ref 4.0–5.6)

## 2021-04-18 MED ORDER — ATORVASTATIN CALCIUM 20 MG PO TABS
ORAL_TABLET | Freq: Every day | ORAL | 1 refills | Status: DC
Start: 2021-04-18 — End: 2022-07-05
  Filled 2021-04-18: qty 30, 30d supply, fill #0
  Filled 2021-06-01: qty 90, 90d supply, fill #1
  Filled 2021-11-03: qty 60, 60d supply, fill #0

## 2021-04-18 MED ORDER — AMLODIPINE BESYLATE 10 MG PO TABS
10.0000 mg | ORAL_TABLET | Freq: Every day | ORAL | 1 refills | Status: DC
  Filled 2021-04-18: qty 30, 30d supply, fill #0
  Filled 2021-06-01: qty 90, 90d supply, fill #1
  Filled 2021-11-03: qty 60, 60d supply, fill #0

## 2021-04-18 NOTE — Patient Instructions (Addendum)
Your blood pressure is elevated today, I do encourage you to check your blood pressure at home on a daily basis, keep a written log and have available for all office visits.  Please contact the office if your blood pressure readings at home are elevated.  We will call you with today's lab results.  Please let us know if there is anything else we can do for you.  Kennieth Rad, PA-C Physician Assistant [facility] [email or web address]  How to Take Your Blood Pressure Blood pressure is a measurement of how strongly your blood is pressing against the walls of your arteries. Arteries are blood vessels that carry blood from your heart throughout your body. Your health care provider takes your blood pressure at each office visit. You can also take your own blood pressure at home with a blood pressure monitor. You may need to take your own blood pressure to: Confirm a diagnosis of high blood pressure (hypertension). Monitor your blood pressure over time. Make sure your blood pressure medicine is working. Supplies needed: Blood pressure monitor. Dining room chair to sit in. Table or desk. Small notebook and pencil or pen. How to prepare To get the most accurate reading, avoid the following for 30 minutes before you check your blood pressure: Drinking caffeine. Drinking alcohol. Eating. Smoking. Exercising. Five minutes before you check your blood pressure: Use the bathroom and urinate so that you have an empty bladder. Sit quietly in a dining room chair. Do not sit in a soft couch or an armchair. Do not talk. How to take your blood pressure To check your blood pressure, follow the instructions in the manual that came with your blood pressure monitor. If you have a digital blood pressure monitor, the instructions may be as follows: Sit up straight in a chair. Place your feet on the floor. Do not cross your ankles or legs. Rest your  left arm at the level of your heart on a table or desk or on the arm of a chair. Pull up your shirt sleeve. Wrap the blood pressure cuff around the upper part of your left arm, 1 inch (2.5 cm) above your elbow. It is best to wrap the cuff around bare skin. Fit the cuff snugly around your arm. You should be able to place only one finger between the cuff and your arm. Position the cord so that it rests in the bend of your elbow. Press the power button. Sit quietly while the cuff inflates and deflates. Read the digital reading on the monitor screen and write the numbers down (record them) in a notebook. Wait 2-3 minutes, then repeat the steps, starting at step 1. What does my blood pressure reading mean? A blood pressure reading consists of a higher number over a lower number. Ideally, your blood pressure should be below 120/80. The first ("top") number is called the systolic pressure. It is a measure of the pressure in your arteries as your heart beats. The second ("bottom") number is called the diastolic pressure. It is a measure of the pressure in your arteries as the heart relaxes. Blood pressure is classified into five stages. The following are the stages for adults who do not have a short-term serious illness or a chronic condition. Systolic pressure and diastolic pressure are measured in a unit called mm Hg (millimeters of mercury).  Normal Systolic pressure: below 623. Diastolic pressure: below 80. Elevated Systolic pressure: 762-831. Diastolic pressure: below 80. Hypertension stage 1 Systolic pressure: 517-616. Diastolic pressure:  80-89. Hypertension stage 2 Systolic pressure: 389 or above. Diastolic pressure: 90 or above. You can have elevated blood pressure or hypertension even if only the systolic or only the diastolic number in your reading is higher than normal. Follow these instructions at home: Medicines Take over-the-counter and prescription medicines only as told by your  health care provider. Tell your health care provider if you are having any side effects from blood pressure medicine. General instructions Check your blood pressure as often as recommended by your health care provider. Check your blood pressure at the same time every day. Take your monitor to the next appointment with your health care provider to make sure that: You are using it correctly. It provides accurate readings. Understand what your goal blood pressure numbers are. Keep all follow-up visits as told by your health care provider. This is important. General tips Your health care provider can suggest a reliable monitor that will meet your needs. There are several types of home blood pressure monitors. Choose a monitor that has an arm cuff. Do not choose a monitor that measures your blood pressure from your wrist or finger. Choose a cuff that wraps snugly around your upper arm. You should be able to fit only one finger between your arm and the cuff. You can buy a blood pressure monitor at most drugstores or online. Where to find more information American Heart Association: www.heart.org Contact a health care provider if: Your blood pressure is consistently high. Your blood pressure is suddenly low. Get help right away if: Your systolic blood pressure is higher than 180. Your diastolic blood pressure is higher than 120. Summary Blood pressure is a measurement of how strongly your blood is pressing against the walls of your arteries. A blood pressure reading consists of a higher number over a lower number. Ideally, your blood pressure should be below 120/80. Check your blood pressure at the same time every day. Avoid caffeine, alcohol, smoking, and exercise for 30 minutes prior to checking your blood pressure. These agents can affect the accuracy of the blood pressure reading. This information is not intended to replace advice given to you by your health care provider. Make sure you  discuss any questions you have with your health care provider. Document Revised: 04/27/2020 Document Reviewed: 06/12/2019 Elsevier Patient Education  2022 [employer].

## 2021-04-18 NOTE — Progress Notes (Signed)
Established Patient Office Visit  Subjective:  Patient ID: Martha Koch, female    DOB: 21-Jun-1956  Age: 65 y.o. MRN: [id number]  CC:  Chief Complaint  Patient presents with   Medication Refill    HPI [name] presents for medication refills.  Reports that she does not check her blood pressure at home, states that it does seem to be slightly elevated every time she goes to a medical appointment.  Denies any hypertensive symptoms.  Endorses compliance to medications.   For prediabetes she is on diet control  States that sleep is good, appetite is good, no other concerns at this time  Past Medical History:  Diagnosis Date   Cervical cancer ([place]) cervical   Hyperlipidemia    Hypertension    Radiation 06/02/14-07/13/14   pelvis 45 gray, vaginal cuff/parametrial boost to 50.4 gray   Radiation 04/06/15-05/13/15   anterior abdominal wall 50.4 gray    Past Surgical History:  Procedure Laterality Date   ABDOMINAL HYSTERECTOMY     COLONOSCOPY  2011   robotic radical hysterectomy, bso, pelvic lymphadenectomy Bilateral 04/21/14   TUBAL LIGATION      Family History  Problem Relation Age of Onset   Hypertension Father    Cancer Other    Diabetes Other    Breast cancer Maternal Aunt        breast cancer, x 4 aunts    Breast cancer Daughter    Colon cancer Neg Hx    Colon polyps Neg Hx    Esophageal cancer Neg Hx    Stomach cancer Neg Hx    Rectal cancer Neg Hx     Social History   Socioeconomic History   Marital status: Single    Spouse name: Not on file   Number of children: 3   Years of education: 12   Highest education level: Not on file  Occupational History   Occupation: [employer]     Employer: [employer]  Tobacco Use   Smoking status: Every Day    Packs/day: 0.50    Years: 30.00    Pack years: 15.00    Types: Cigarettes   Smokeless tobacco: Never   Tobacco comments:    smokes 2-3 a day  Vaping Use   Vaping Use: Never used   Substance and Sexual Activity   Alcohol use: No    Alcohol/week: 0.0 standard drinks   Drug use: No   Sexual activity: Not Currently  Other Topics Concern   Not on file  Social History Narrative   Has 3 children. They live in [place].   Has 3 grandchildren.    Martha Koch (oldest daughter)    Lives alone.    Social Determinants of Health   Financial Resource Strain: Not on file  Food Insecurity: Not on file  Transportation Needs: Not on file  Physical Activity: Not on file  Stress: Not on file  Social Connections: Not on file  Intimate Partner Violence: Not on file    Outpatient Medications Prior to Visit  Medication Sig Dispense Refill   Ascorbic Acid (VITAMIN C) 1000 MG tablet Take 1,000 mg by mouth daily. Reported on 08/26/2015     cholecalciferol (VITAMIN D) 1000 units tablet Take 1,000 Units by mouth daily.     amLODipine (NORVASC) 10 MG tablet Take 1 tablet (10 mg total) by mouth daily. 90 tablet 0   atorvastatin (LIPITOR) 20 MG tablet TAKE 1 TABLET (20 MG TOTAL) BY MOUTH DAILY. 90 tablet 0  acetaminophen (TYLENOL) 500 MG tablet Take 500 mg by mouth every 6 (six) hours as needed. (Patient not taking: Reported on 04/18/2021)     ibuprofen (ADVIL) 600 MG tablet Take 1 tablet (600 mg total) by mouth every 6 (six) hours as needed for headache. (Patient not taking: Reported on 04/18/2021) 30 tablet 0   predniSONE (DELTASONE) 5 MG tablet Take 6 pills for first day, 5 pills second day, 4 pills third day, 3 pills fourth day, 2 pills the fifth day, and 1 pill sixth day. 21 tablet 0   Facility-Administered Medications Prior to Visit  Medication Dose Route Frequency Provider Last Rate Last Admin   0.9 %  sodium chloride infusion  500 mL Intravenous Once Nelida Meuse III, MD        Allergies  Allergen Reactions   Aspirin     REACTION: stomach   Penicillins     REACTION: hives    ROS Review of Systems  Constitutional: Negative.   HENT: Negative.    Eyes:  Negative.   Respiratory:  Negative for shortness of breath.   Cardiovascular:  Negative for chest pain.  Gastrointestinal: Negative.   Endocrine: Negative.   Genitourinary: Negative.   Musculoskeletal: Negative.   Skin: Negative.   Allergic/Immunologic: Negative.   Neurological: Negative.   Hematological: Negative.   Psychiatric/Behavioral: Negative.       Objective:    Physical Exam Vitals and nursing note reviewed.  Constitutional:      Appearance: Normal appearance.  HENT:     Head: Normocephalic and atraumatic.     Right Ear: External ear normal.     Left Ear: External ear normal.     Nose: Nose normal.     Mouth/Throat:     Mouth: Mucous membranes are moist.     Pharynx: Oropharynx is clear.  Eyes:     Extraocular Movements: Extraocular movements intact.     Conjunctiva/sclera: Conjunctivae normal.     Pupils: Pupils are equal, round, and reactive to light.  Cardiovascular:     Rate and Rhythm: Normal rate and regular rhythm.     Pulses: Normal pulses.  Pulmonary:     Effort: Pulmonary effort is normal.     Breath sounds: Normal breath sounds.  Musculoskeletal:        General: Normal range of motion.     Cervical back: Normal range of motion and neck supple.  Skin:    General: Skin is warm and dry.  Neurological:     General: No focal deficit present.     Mental Status: She is alert and oriented to person, place, and time.  Psychiatric:        Mood and Affect: Mood normal.        Behavior: Behavior normal.        Thought Content: Thought content normal.        Judgment: Judgment normal.    BP (!) 146/83 (BP Location: Left Arm, Patient Position: Sitting, Cuff Size: Normal)   Pulse 86   Temp 98.2 F (36.8 C) (Oral)   Resp 18   Ht 5' 7"  (1.702 m)   Wt 125 lb (56.7 kg)   SpO2 97%   BMI 19.58 kg/m  Wt Readings from Last 3 Encounters:  04/18/21 125 lb (56.7 kg)  05/20/20 125 lb 6.4 oz (56.9 kg)  01/05/20 127 lb (57.6 kg)     Health Maintenance Due   Topic Date Due   COVID-19 Vaccine (1) Never done  Zoster Vaccines- Shingrix (1 of 2) Never done   DEXA SCAN  Never done   INFLUENZA VACCINE  01/30/2021    There are no preventive care reminders to display for this patient.  Lab Results  Component Value Date   TSH 0.825 03/02/2014   Lab Results  Component Value Date   WBC 3.1 (L) 11/18/2014   HGB 12.8 11/18/2014   HCT 38.9 11/18/2014   MCV 82.4 11/18/2014   PLT 255 11/18/2014   Lab Results  Component Value Date   NA 143 05/20/2020   K 4.5 05/20/2020   CHLORIDE 110 (H) 09/23/2015   CO2 24 05/20/2020   GLUCOSE 94 05/20/2020   BUN 13 05/20/2020   CREATININE 0.96 05/20/2020   BILITOT 0.3 11/20/2019   ALKPHOS 80 11/20/2019   AST 17 11/20/2019   ALT 10 11/20/2019   PROT 7.2 11/20/2019   ALBUMIN 4.7 11/20/2019   CALCIUM 9.5 05/20/2020   ANIONGAP 8 09/23/2015   EGFR 84 (L) 09/23/2015   Lab Results  Component Value Date   CHOL 197 11/20/2019   Lab Results  Component Value Date   HDL 77 11/20/2019   Lab Results  Component Value Date   LDLCALC 110 (H) 11/20/2019   Lab Results  Component Value Date   TRIG 56 11/20/2019   Lab Results  Component Value Date   CHOLHDL 2.6 11/20/2019   Lab Results  Component Value Date   HGBA1C 5.7 (A) 04/18/2021      Assessment & Plan:   Problem List Items Addressed This Visit       Cardiovascular and Mediastinum   Essential hypertension - Primary   Relevant Medications   amLODipine (NORVASC) 10 MG tablet   atorvastatin (LIPITOR) 20 MG tablet   Other Relevant Orders   CBC with Differential/Platelet   Comp. Metabolic Panel (12)   Elevated blood pressure reading in office with diagnosis of hypertension   Relevant Medications   amLODipine (NORVASC) 10 MG tablet   atorvastatin (LIPITOR) 20 MG tablet     Other   Dyslipidemia   Relevant Medications   atorvastatin (LIPITOR) 20 MG tablet   Other Relevant Orders   Lipid panel   Prediabetes   Relevant Orders   POCT  A1C (Completed)   Body mass index (BMI) 19.9 or less, adult    Meds ordered this encounter  Medications   amLODipine (NORVASC) 10 MG tablet    Sig: Take 1 tablet (10 mg total) by mouth daily.    Dispense:  90 tablet    Refill:  1    Order Specific Question:   Supervising Provider    Answer:   Asencion Noble E [[id number]]   atorvastatin (LIPITOR) 20 MG tablet    Sig: TAKE 1 TABLET (20 MG TOTAL) BY MOUTH DAILY.    Dispense:  90 tablet    Refill:  1    Order Specific Question:   Supervising Provider    Answer:   WRIGHT, PATRICK E [[id number]]  1. Essential hypertension Continue current regimen.  Blood pressure elevated today, patient encouraged to check blood pressure at home, keep a written log and have available for all office visits.  Patient encouraged to call office prior to 81-monthfollow-up if blood pressure readings remain elevated.  Patient understands and agrees.  Fasting labs completed today. - CBC with Differential/Platelet - Comp. Metabolic Panel (12) - amLODipine (NORVASC) 10 MG tablet; Take 1 tablet (10 mg total) by mouth daily.  Dispense: 90 tablet; Refill: 1  2. Elevated blood pressure reading in office with diagnosis of hypertension   3. Dyslipidemia Continue current regimen - Lipid panel - atorvastatin (LIPITOR) 20 MG tablet; TAKE 1 TABLET (20 MG TOTAL) BY MOUTH DAILY.  Dispense: 90 tablet; Refill: 1  4. Prediabetes Well-controlled with diet - POCT A1C  5. Body mass index (BMI) 19.9 or less, adult    I have reviewed the patient's medical history (PMH, PSH, Social History, Family History, Medications, and allergies) , and have been updated if relevant. I spent 30 minutes reviewing chart and  face to face time with patient.     Follow-up: Return in about 3 months (around 07/19/2021).    Loraine Grip Mayers, PA-C

## 2021-04-19 ENCOUNTER — Ambulatory Visit: Payer: No Typology Code available for payment source | Admitting: Family Medicine

## 2021-04-19 ENCOUNTER — Other Ambulatory Visit: Payer: Self-pay

## 2021-04-19 LAB — CBC WITH DIFFERENTIAL/PLATELET
Basophils Absolute: 0 10*3/uL (ref 0.0–0.2)
Basos: 1 %
EOS (ABSOLUTE): 0 10*3/uL (ref 0.0–0.4)
Eos: 1 %
Hematocrit: 40.2 % (ref 34.0–46.6)
Hemoglobin: 13.1 g/dL (ref 11.1–15.9)
Immature Grans (Abs): 0 10*3/uL (ref 0.0–0.1)
Immature Granulocytes: 0 %
Lymphocytes Absolute: 2.2 10*3/uL (ref 0.7–3.1)
Lymphs: 48 %
MCH: 26.6 pg (ref 26.6–33.0)
MCHC: 32.6 g/dL (ref 31.5–35.7)
MCV: 82 fL (ref 79–97)
Monocytes Absolute: 0.3 10*3/uL (ref 0.1–0.9)
Monocytes: 6 %
Neutrophils Absolute: 2 10*3/uL (ref 1.4–7.0)
Neutrophils: 44 %
Platelets: 246 10*3/uL (ref 150–450)
RBC: 4.92 x10E6/uL (ref 3.77–5.28)
RDW: 15 % (ref 11.7–15.4)
WBC: 4.6 10*3/uL (ref 3.4–10.8)

## 2021-04-19 LAB — COMP. METABOLIC PANEL (12)
AST: 15 IU/L (ref 0–40)
Albumin/Globulin Ratio: 2 (ref 1.2–2.2)
Albumin: 4.7 g/dL (ref 3.8–4.8)
Alkaline Phosphatase: 72 IU/L (ref 44–121)
BUN/Creatinine Ratio: 8 — ABNORMAL LOW (ref 12–28)
BUN: 7 mg/dL — ABNORMAL LOW (ref 8–27)
Bilirubin Total: 0.2 mg/dL (ref 0.0–1.2)
Calcium: 9.5 mg/dL (ref 8.7–10.3)
Chloride: 105 mmol/L (ref 96–106)
Creatinine, Ser: 0.83 mg/dL (ref 0.57–1.00)
Globulin, Total: 2.3 g/dL (ref 1.5–4.5)
Glucose: 85 mg/dL (ref 70–99)
Potassium: 4.8 mmol/L (ref 3.5–5.2)
Sodium: 143 mmol/L (ref 134–144)
Total Protein: 7 g/dL (ref 6.0–8.5)
eGFR: 78 mL/min/{1.73_m2} (ref >59)

## 2021-04-19 LAB — LIPID PANEL
Chol/HDL Ratio: 2.4 ratio (ref 0.0–4.4)
Cholesterol, Total: 200 mg/dL — ABNORMAL HIGH (ref 100–199)
HDL: 82 mg/dL (ref >39)
LDL Chol Calc (NIH): 105 mg/dL — ABNORMAL HIGH (ref 0–99)
Triglycerides: 71 mg/dL (ref 0–149)
VLDL Cholesterol Cal: 13 mg/dL (ref 5–40)

## 2021-04-24 ENCOUNTER — Telehealth: Payer: Self-pay | Admitting: *Deleted

## 2021-04-24 NOTE — Telephone Encounter (Signed)
Per signed DPR patient is aware of results and to continue cholesterol medication and diet.

## 2021-04-24 NOTE — Telephone Encounter (Signed)
-----   Message from Kennieth Rad, [place] sent at 04/19/2021  1:11 PM EDT ----- Please call patient and let her know that her kidney and liver function are within normal limits, she does not show signs of anemia.  Her cholesterol is overall within normal limits, however her LDL is still slightly elevated.  It is important that she take her cholesterol medication on a daily basis and continue working on a low-cholesterol diet.

## 2021-06-01 ENCOUNTER — Other Ambulatory Visit: Payer: Self-pay

## 2021-06-02 ENCOUNTER — Other Ambulatory Visit: Payer: Self-pay | Admitting: Family Medicine

## 2021-06-02 DIAGNOSIS — G629 Polyneuropathy, unspecified: Secondary | ICD-10-CM

## 2021-06-02 NOTE — Progress Notes (Unsigned)
Please inform her to schedule a lab appointment as I placed orders for labs requested by her neurosurgeon.

## 2021-06-05 NOTE — Progress Notes (Signed)
Pt was called and she states that she has already had ab work done and she states that her neurosurgeon should have the results.

## 2021-07-26 ENCOUNTER — Encounter: Payer: Self-pay | Admitting: Neurology

## 2021-07-26 ENCOUNTER — Ambulatory Visit (INDEPENDENT_AMBULATORY_CARE_PROVIDER_SITE_OTHER): Payer: Self-pay | Admitting: Neurology

## 2021-07-26 VITALS — BP 148/90 | HR 80 | Ht 67.0 in | Wt 130.2 lb

## 2021-07-26 DIAGNOSIS — M62561 Muscle wasting and atrophy, not elsewhere classified, right lower leg: Secondary | ICD-10-CM

## 2021-07-26 DIAGNOSIS — R292 Abnormal reflex: Secondary | ICD-10-CM

## 2021-07-26 DIAGNOSIS — G1229 Other motor neuron disease: Secondary | ICD-10-CM

## 2021-07-26 DIAGNOSIS — R258 Other abnormal involuntary movements: Secondary | ICD-10-CM

## 2021-07-26 DIAGNOSIS — R29898 Other symptoms and signs involving the musculoskeletal system: Secondary | ICD-10-CM

## 2021-07-26 NOTE — Patient Instructions (Signed)
MRI thoracic and cervical w/wo contrast we will call

## 2021-07-26 NOTE — Progress Notes (Signed)
[facility]    Provider:  Dr Jaynee Eagles Requesting Provider: Altamese Solomon, MD Primary Care Provider:  Charlott Rakes, MD  CC:  right leg weakness and atrophy  HPI:  Martha Koch is a 66 y.o. female here as requested by Altamese Battlement Mesa, MD for right leg lower extremity weakness, she received an injection in the epidural lumbar space per Dr. Ginette Pitman, I reviewed his notes, she had essentially no response to all of this, again she has a complicated clinical history with right leg motor loss occurring in early October and associated with right lower extremity atrophy of the quad and weakness of eversion, she is continued to deny any bowel or bladder symptoms, denied diabetes, she has some foraminal stenosis noted on MRI of the right side of L4-L5 and L5-S1 levels and some mild degenerative disc disease, symptoms are new and not improving, obvious right lower extremity atrophy, she has prior imaging from 2020, she underwent CT of the head and neck reportedly obtained for mental status change with no evidence of any abnormality of significance only mild degenerative disc disease at C5-C6.  The right leg symptoms started in April became acutely worse in August when she presented to the ER on 02/10/2021, she denied any traumatic injury, no significant alleviating factors or clear aggravating factors, when I first saw her in March 2022 she had intact motor function 5 out of 5 strength but some reduced sensation down the midfoot and to a much lesser extent the great and small toes, no clonus, markedly positive straight leg raise on the right for pain, failure to respond to a steroid Dosepak at urgent care, EMG nerve conduction was completed was within normal limits with no evidence for lumbar radiculopathy, plexopathy, mononeuropathy or polyneuropathy, possibly a central neurologic process that could be accounting for this, reviewed report. Doesn know if med made it better.  The whole right leg  started hurting. Started in March in 2022, started out with her hurting when she was walking, slowly progressive, atrophy, startd in the whole right leg, more of a muscle pain, in no distribution, in the whole leg, has not spread to the other leg or arms, worsens with use, numbness up the back from the lower leg to above the knee. EMG/NCS was in October, no fasciculations or twitching. Progressive weakness. Slowly progressive. 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 She worked a lot that month when it started and she thought that's why it was aching, she triggered something. Pain is the same, started with severe pain, pain never went away, No other focal neurologic deficits, associated symptoms, inciting events or modifiable factors.  Reviewed notes, labs and imaging from outside physicians, which showed see above  MRI lumbar spine August 2022 reviewed images and agree: Mild degenerative disc disease at L4-5 and L5-S1 with borderline-mild bilateral foraminal stenosis at both levels. No canal stenosis at any level.  She had extensive blood testing she states that [place] [facility], Dr. Marcello Moores, I have no records of that in my referral notes, in epic or in "Care Everywhere".  I also did not get all the results from EMG nerve conduction study, I just received the report, I need all the values and data as well. I have sent a request to medical records to get results.     Review of Systems: Patient complains of symptoms per HPI as well as the following symptoms leg weakness. Pertinent negatives and positives per HPI. All others negative.   Social History  Socioeconomic History   Marital status: Single    Spouse name: Not on file   Number of children: 3   Years of education: 20   Highest education level: Not on file  Occupational History   Occupation: [employer]     Employer: [employer]  Tobacco Use   Smoking status: Every Day    Packs/day: 0.50    Years: 30.00    Pack years:  15.00    Types: Cigarettes   Smokeless tobacco: Never   Tobacco comments:    smokes 2-3 a day  Vaping Use   Vaping Use: Never used  Substance and Sexual Activity   Alcohol use: No    Alcohol/week: 0.0 standard drinks   Drug use: No   Sexual activity: Not Currently  Other Topics Concern   Not on file  Social History Narrative   Has 3 children. They live in [place].   Has 3 grandchildren.    Uvaldo Rising (oldest daughter)    Lives alone.    Social Determinants of Health   Financial Resource Strain: Not on file  Food Insecurity: Not on file  Transportation Needs: Not on file  Physical Activity: Not on file  Stress: Not on file  Social Connections: Not on file  Intimate Partner Violence: Not on file    Family History  Problem Relation Age of Onset   Hypertension Father    Breast cancer Maternal Aunt        breast cancer, x 4 aunts    Breast cancer Daughter    Cancer Other    Diabetes Other    Colon cancer Neg Hx    Colon polyps Neg Hx    Esophageal cancer Neg Hx    Stomach cancer Neg Hx    Rectal cancer Neg Hx    Neuropathy Neg Hx     Past Medical History:  Diagnosis Date   Cervical cancer ([place]) cervical   Hyperlipidemia    Hypertension    Radiation 06/02/14-07/13/14   pelvis 45 gray, vaginal cuff/parametrial boost to 50.4 gray   Radiation 04/06/15-05/13/15   anterior abdominal wall 50.4 gray    Patient Active Problem List   Diagnosis Date Noted   Elevated blood pressure reading in office with diagnosis of hypertension 04/18/2021   Dyslipidemia 04/18/2021   Prediabetes 04/18/2021   Body mass index (BMI) 19.9 or less, adult 04/18/2021   History of hyperlipidemia 05/21/2017   Pancreatic cyst 09/18/2016   COPD (chronic obstructive pulmonary disease) ([place]) 04/23/2016   Mild airflow obstruction on pulmonary function test 03/18/2016   Abnormal PFT 03/18/2016   Diffusion capacity of lung (dl), decreased 03/18/2016   Abnormal CT of the chest 02/16/2016    Pulmonary nodules 02/16/2016   Recurrent cervical cancer ([place]) 03/04/2015   Abdominal wall mass of left upper quadrant 01/13/2015   Cervical cancer, FIGO stage IB1 ([place]) 11/20/2014   Continuous tobacco abuse 11/20/2014   Dense breast tissue 11/20/2014   Cervical cancer ([place]) 04/12/2014   Ovarian mass 04/12/2014   Loss of weight 03/02/2014   Essential hypertension 03/02/2014    Past Surgical History:  Procedure Laterality Date   ABDOMINAL HYSTERECTOMY     COLONOSCOPY  2011   robotic radical hysterectomy, bso, pelvic lymphadenectomy Bilateral 04/21/14   TUBAL LIGATION      Current Outpatient Medications  Medication Sig Dispense Refill   acetaminophen (TYLENOL) 500 MG tablet Take 500 mg by mouth every 6 (six) hours as needed.     amLODipine (NORVASC) 10  MG tablet Take 1 tablet (10 mg total) by mouth daily. 90 tablet 1   Ascorbic Acid (VITAMIN C) 1000 MG tablet Take 1,000 mg by mouth daily. Reported on 08/26/2015     atorvastatin (LIPITOR) 20 MG tablet TAKE 1 TABLET (20 MG TOTAL) BY MOUTH DAILY. 90 tablet 1   cholecalciferol (VITAMIN D) 1000 units tablet Take 1,000 Units by mouth daily.     ibuprofen (ADVIL) 600 MG tablet Take 1 tablet (600 mg total) by mouth every 6 (six) hours as needed for headache. 30 tablet 0   Current Facility-Administered Medications  Medication Dose Route Frequency Provider Last Rate Last Admin   0.9 %  sodium chloride infusion  500 mL Intravenous Once Nelida Meuse III, MD        Allergies as of 07/26/2021 - Review Complete 07/26/2021  Allergen Reaction Noted   Aspirin  10/24/2009   Penicillins  10/24/2009    Vitals: BP (!) 148/90    Pulse 80    Ht 5\' 7"  (1.702 m)    Wt 130 lb 3.2 oz (59.1 kg)    BMI 20.39 kg/m  Last Weight:  Wt Readings from Last 1 Encounters:  07/26/21 130 lb 3.2 oz (59.1 kg)   Last Height:   Ht Readings from Last 1 Encounters:  07/26/21 5\' 7"  (1.702 m)   Gneral atrophy right leg Hip flexion 3/5 Leg flexion and exten4/5   Doriflexu 5-  Brisk reflexes Clonus  Physical exam: Exam: Gen: NAD, conversant, well nourised, obese, well groomed                     CV: RRR, no MRG. No Carotid Bruits. No peripheral edema, warm, nontender Eyes: Conjunctivae clear without exudates or hemorrhage  Neuro: Detailed Neurologic Exam  Speech:    Speech is normal; fluent and spontaneous with normal comprehension.  Cognition:    The patient is oriented to person, place, and time;     recent and remote memory intact;     language fluent;     normal attention, concentration,     fund of knowledge Cranial Nerves:    The pupils are equal, round, and reactive to light. The fundi are flat. Visual fields are full to finger confrontation. Extraocular movements are intact. Trigeminal sensation is intact and the muscles of mastication are normal. The face is symmetric. The palate elevates in the midline. Hearing intact. Voice is normal. Shoulder shrug is normal. The tongue has normal motion without fasciculations.   Coordination:    Normal  Gait:    Slightly antalgic due to right leg weakness.   Motor Observation:   Generalized right leg atrophy Tone:    decreased muscle tone right lower leg otherwise normal   Posture:    normal erect    Strength:RIght Hip flexion 3/5, right Leg flexion and extension 4/5, right  Doriflexu 5-/5 otherwise strength is V/V in the upper and lower limbs.      Sensation: intact to LT, she reports some numbness in the back of the leg but overall sensation appears normal     Reflex Exam:  DTR's:    Deep tendon reflexes in the upper and lower extremities are brisk bilaterally.   Toes:    The toes are upgoing bilaterally.   Clonus:    Clonus is present Ajs 2 beats.    Assessment/Plan:  66 y.o. female here as requested by Altamese Vincent, MD for right leg lower extremity weakness, atrophy.  Past medical  history hypertension, COPD, cervical cancer, prediabetes, tobacco abuse, dyslipidemia,  weight loss . she received an MRI lumbar spine which did not explain and ESI did not help.  She has very concerning symptoms, right leg proximal greater than distal weakness, generalized right leg atrophy, abnormal deep tendon reflexes brisk in all extremities with clonus in AJs and +babinski bilaterally.  Progressively worsening.  I would be initially concerned with motor neuron disease(ALS) given lower and upper motor neuron symptoms, but EMG nerve conduction study with Dr. Jeanie Cooks was reportedly normal, I do not see any fasciculations.  Very concerning and unusual.   -Very odd presentation to have atrophy without evidence of lower motor neuron disease, however the clonus and brisk reflexes in all extremities and bilat babinski can be consistent with upper motor neuron lesion in either the cervical or thoracic spine, possibly spinal cord lesion or cervical myelopathy or metastasis; she has had cervical cancer in the past and a hx of smoking with COPD.  Cannot rule out motor neuron disease and if we do not find anything in the cervical spine or thoracic spine we may have to repeat EMG nerve conduction study to look for any progression and do some more blood work and imaging as stated next.  -If imaging cervical and thoracic does not reveal etiology, we can try mri brain; also she does have COPD and a long history of smoking and previous cervical cancer may be some sort of paraneoplastic disorder.she does have prediabetes but given her brisk reflexes that is not consistent with a lower extremity amyotrophy.Next if no results in spine, I would MRI the the brain, I would pan scan her for malignancy.  MRI of the lumbar spine did not show any etiology as mentioned above, given normal EMG conduction study a right-sided lumbosacral plexopathy is unlikely but we would repeat the EMG nerve conduction as well for progression in the absence of any other finding.  - She had extensive blood testing she states that [place]  [facility], Dr. Marcello Moores, I have no records of that in my referral notes, in epic or in "Care Everywhere".  I also did not get all the results from EMG nerve conduction study, I just received the report, I need all the values and data as well. I have sent a request to medical records to get results.   Orders Placed This Encounter  Procedures   MR CERVICAL SPINE W WO CONTRAST   MR THORACIC SPINE W WO CONTRAST   No orders of the defined types were placed in this encounter.   Cc: Altamese [place], MD,  Charlott Rakes, MD  Sarina Ill, MD  [facility] [address] [place], [place] [address]  Phone [phone] Fax [phone]  I spent 110 minutes of face-to-face and non-face-to-face time with patient on the  1. Right leg weakness   2. Atrophy of muscle of right lower leg   3. Clonus   4. Abnormal DTR (deep tendon reflex)   5. Babinski sign positive in left foot   6. 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  This included previsit chart review, lab review, study review, order entry, electronic health record documentation, patient education on the different diagnostic and therapeutic options, counseling and coordination of care, risks and benefits of management, compliance, or risk factor reduction

## 2021-07-27 ENCOUNTER — Encounter: Payer: Self-pay | Admitting: Neurology

## 2021-07-27 ENCOUNTER — Telehealth: Payer: Self-pay | Admitting: Neurology

## 2021-07-27 NOTE — Telephone Encounter (Addendum)
Results received and placed in Dr Cathren Laine box for review.

## 2021-07-27 NOTE — Telephone Encounter (Signed)
Martha Koch: referral from [place] [facility]). She reports that had extensive blood testing at [facility] she states that [facility], Dr. Marcello Moores, completed. I have no records of that in my referral notes, in epic or in "Care Everywhere".  I also did not get all the results from EMG nerve conduction study, I just received the report, I need all the values and data as well.can you get that info for me please? thanks

## 2021-07-31 ENCOUNTER — Telehealth: Payer: Self-pay | Admitting: Neurology

## 2021-07-31 NOTE — Telephone Encounter (Signed)
FYI only  I spoke with the patient she getting Medicare part B states it will be affected in Feb-order sent to GI. They will reach out to the patient to schedule.

## 2021-07-31 NOTE — Telephone Encounter (Signed)
pt is getting Medicare part B states it will be affected in Feb-order sent to GI. They will reach out to the patient to schedule.

## 2021-08-10 ENCOUNTER — Ambulatory Visit: Payer: Medicare Other | Admitting: Neurology

## 2021-08-13 ENCOUNTER — Ambulatory Visit
Admission: RE | Admit: 2021-08-13 | Discharge: 2021-08-13 | Disposition: A | Discharge status: Home / Self Care | Payer: Self-pay | Source: Ambulatory Visit | Attending: Neurology | Admitting: Neurology

## 2021-08-13 ENCOUNTER — Other Ambulatory Visit: Payer: Self-pay

## 2021-08-13 DIAGNOSIS — M62561 Muscle wasting and atrophy, not elsewhere classified, right lower leg: Secondary | ICD-10-CM

## 2021-08-13 DIAGNOSIS — R292 Abnormal reflex: Secondary | ICD-10-CM

## 2021-08-13 DIAGNOSIS — R258 Other abnormal involuntary movements: Secondary | ICD-10-CM

## 2021-08-13 DIAGNOSIS — R29898 Other symptoms and signs involving the musculoskeletal system: Secondary | ICD-10-CM

## 2021-08-13 DIAGNOSIS — G1229 Other motor neuron disease: Secondary | ICD-10-CM

## 2021-08-13 MED ORDER — GADOBENATE DIMEGLUMINE 529 MG/ML IV SOLN
12.0000 mL | Freq: Once | INTRAVENOUS | Status: AC | PRN
  Administered 2021-08-13: 12 mL via INTRAVENOUS

## 2021-08-14 ENCOUNTER — Telehealth: Payer: Self-pay | Admitting: Neurology

## 2021-08-14 NOTE — Telephone Encounter (Signed)
MRI Thoracic and Cervical spine were both normal. Very unusual case of upper and lower motor neuron signs of the right lower extremity with muscle wasting. She had a reportedly normal MRI lumbar spine and emg/ncs at [facility] but I think we need to repeat emg/ncs. Do I have any openings soon or can we squeeze her in? If not I could ask Dr. Krista Blue as well, she has also seen this patient and has helped me with her maybe if Dr. Krista Blue has an opening she can do a one limb emg lower right emg or I can add it onto my schedule?  Martha Koch and [facility] please take a look.

## 2021-08-15 NOTE — Telephone Encounter (Signed)
I'm always available on Mon and Tues. We could have the patient come in at around 3p or so for the NCS and I could have them ready by 4ish for the EMG. Are we doing 1 arm, 1 leg, with the possibility of doing all 4?

## 2021-08-16 ENCOUNTER — Telehealth: Payer: Self-pay | Admitting: Neurology

## 2021-08-16 NOTE — Telephone Encounter (Signed)
Spoke to pt and she was appreciative of the next appt for Tuesday 08-22-2021 arrive 1430 for 1445 ncemg appt.

## 2021-08-16 NOTE — Telephone Encounter (Signed)
Spoke to Martha Koch and relayed that MRI cervical and thoracic img studies were normal.  Dr. Jaynee Eagles wanted to do another [place] EMG study 08-21-21 1445?  Martha Koch stated could not do that due to work, but said Tues 08-22-21 afternoon she could do.

## 2021-08-16 NOTE — Telephone Encounter (Signed)
08/22/21, arrive at 2:30p for a 2:45p NCS. This is ok with me, if it's ok with Dr. Jaynee Eagles.

## 2021-08-16 NOTE — Telephone Encounter (Signed)
MRI cervical and thoracic spines are normal. I think we need to repeat the emg/ncs. I will ask my office to call you. I'm so sorry we did not find anything!  Written by Melvenia Beam, MD on 08/14/2021  4:27 PM EST

## 2021-08-16 NOTE — Telephone Encounter (Signed)
Pt is asking for a call with results to MRI as soon as they are available.

## 2021-08-22 ENCOUNTER — Ambulatory Visit (INDEPENDENT_AMBULATORY_CARE_PROVIDER_SITE_OTHER): Payer: Medicare Other | Admitting: Neurology

## 2021-08-22 DIAGNOSIS — M62561 Muscle wasting and atrophy, not elsewhere classified, right lower leg: Secondary | ICD-10-CM | POA: Diagnosis not present

## 2021-08-22 DIAGNOSIS — R29898 Other symptoms and signs involving the musculoskeletal system: Secondary | ICD-10-CM

## 2021-08-22 DIAGNOSIS — Z0289 Encounter for other administrative examinations: Secondary | ICD-10-CM

## 2021-08-22 NOTE — Patient Instructions (Signed)
Not worse.

## 2021-08-23 ENCOUNTER — Encounter: Payer: Self-pay | Admitting: Neurology

## 2021-08-23 ENCOUNTER — Telehealth: Payer: Self-pay | Admitting: Neurology

## 2021-08-23 DIAGNOSIS — M62561 Muscle wasting and atrophy, not elsewhere classified, right lower leg: Secondary | ICD-10-CM | POA: Insufficient documentation

## 2021-08-23 DIAGNOSIS — R29898 Other symptoms and signs involving the musculoskeletal system: Secondary | ICD-10-CM | POA: Insufficient documentation

## 2021-08-23 NOTE — Telephone Encounter (Signed)
Referral sent to [facility] [phone].

## 2021-08-23 NOTE — Procedures (Signed)
Full Name: Martha Koch Gender: Female MRN #: [id number] Date of Birth: June 29, 1956    Visit Date: 08/22/2021 14:43 Age: 66 Years Examining Physician: Sarina Ill, MD  Requesting Provider: Altamese Letcher, MD Primary Care Provider:  Charlott Rakes, MD   Height: 5 feet 7 inch 130lbs Patient History: Patient with generalized right leg weakness and atrophy throughout the right leg proximal and distal.  No other limbs are involved.    Summary: The right saphenous sensory nerve showed decreased amplitude (2uv, N>5).  The left saphenous sensory nerve showed decreased amplitude (3uv, N>5).  However these were technically difficult due to patient's intolerance to the exam and likely not accurate. All remaining nerves (as indicated in the following tables) were within normal limits.  All muscles showed decreased insertional due to atrophy but otherwise motor unit amplitude, recruitment, duration and morphology were normal.  Unfortunately could not complete the exam due to pain and patient's inability to stay still.     Conclusion:Patient with generalized right leg weakness and atrophy throughout the right leg both proximal and distal.  No other limbs are involved.  EMG nerve conduction study was performed on the right lower extremity.  Unfortunately patient could not tolerate most of the exam due to severe pain.  Some of the conductions were technically difficult due to patient's yelling out in pain and moving.  All nerve conductions were within normal limits except for the saphenous sensory nerves which were slightly decreased likely due to technical difficulties discussed above.  All muscles showed decreased insertional activity due to atrophy but otherwise motor unit amplitude, duration, recruitment and morphology were normal.  Unfortunately could not complete the exam.  At this time we will request a muscle biopsy.     ------------------------------- Sarina Ill, M.D.  [facility] [address], [place], [place] [address] Tel: [phone] Fax: [phone]  Verbal informed consent was obtained from the patient, patient was informed of potential risk of procedure, including bruising, bleeding, hematoma formation, infection, muscle weakness, muscle pain, numbness, among others.        [place]    Nerve / Sites Muscle Latency Ref. Amplitude Ref. Rel Amp Segments Distance Velocity Ref. Area    ms ms mV mV %  cm m/s m/s mVms  R Peroneal - EDB     Ankle EDB 4.1 ?6.5 5.5 ?2.0 100 Ankle - EDB 9   14.2     Fib head EDB 10.3  5.1  92.4 Fib head - Ankle 28 45 ?44 14.0     Pop fossa EDB 12.5  5.1  100 Pop fossa - Fib head 10 45 ?44 14.2         Pop fossa - Ankle      L Peroneal - EDB     Ankle EDB 3.6 ?6.5 3.6 ?2.0 100 Ankle - EDB 9   12.3     Fib head EDB 9.7  3.3  92.3 Fib head - Ankle 27 44 ?44 11.9     Pop fossa EDB 12.0  3.2  97.1 Pop fossa - Fib head 10 44 ?44 11.6         Pop fossa - Ankle      R Tibial - AH     Ankle AH 3.8 ?5.8 10.2 ?4.0 100 Ankle - AH 9   26.3     Pop fossa AH 12.4  8.6  84.6 Pop fossa - Ankle 37 43 ?41 25.5  L Tibial - AH  Ankle AH 3.7 ?5.8 8.3 ?4.0 100 Ankle - AH 9   20.5     Pop fossa AH 12.2  7.0  84.8 Pop fossa - Ankle 38 45 ?41 20.2  R Femoral - Vastus Med     B. Ing Lig Vastus Med 2.6  6.0  100 B. Ing Lig - Vastus Med    46.5     A. Ing Lig Vastus Med 4.3  4.7  79.1 A. Ing Lig - B. Ing Lig 10 59  34.3  L Femoral - Vastus Med     B. Ing Lig Vastus Med 2.9  10.2  100 B. Ing Lig - Vastus Med    60.0     A. Ing Lig Vastus Med 4.1  8.3  81 A. Ing Lig - B. Ing Lig 10 83  52.5                 SNC    Nerve / Sites Rec. Site Peak Lat Ref.  Amp Ref. Segments Distance    ms ms V V  cm  R Sural - Ankle (Calf)     Calf Ankle 3.5 ?4.4 10 ?6 Calf - Ankle 14  L Sural - Ankle (Calf)     Calf Ankle 3.4 ?4.4 9 ?6 Calf - Ankle 14  R Superficial peroneal - Ankle     Lat leg Ankle 3.1 ?4.4 7 ?6 Lat leg - Ankle 14  L Superficial  peroneal - Ankle     Lat leg Ankle 3.6 ?4.4 6 ?6 Lat leg - Ankle 14  R Saphenous - Ankle (Medial leg)     Medial leg Ankle 3.7 ?4.2 3 ?5 Medial leg - Ankle 14  L Saphenous - Ankle (Medial leg)     Medial leg Ankle 3.4 ?4.2 2 ?5 Medial leg - Ankle 14                 F  Wave    Nerve F Lat Ref.   ms ms  R Tibial - AH 51.3 ?56.0  L Tibial - AH 53.1 ?56.0         EMG Summary Table    Spontaneous MUAP Recruitment  Muscle IA Fib PSW Fasc Other Amp Dur. Poly Pattern  R. Iliopsoas Decreased None None None _______ Normal Normal Normal Normal  R. Lumbar paraspinals (low) Declined    _______      R. Vastus lateralis Decreased None None None _______ Normal Normal Normal Normal  R. Vastus medialis Decreased None None None _______ Normal Normal Normal Normal  R. Adductor longus Declined    _______      R. Gastrocnemius (Medial head) Decreased None None None _______ Normal Normal Normal Normal  R. Tibialis anterior Decreased None None None _______ Normal Normal Normal Normal  R. Extensor hallucis longus Decreased None None None _______ Normal Normal Normal Normal  R. Abductor hallucis Declined    _______      R. Biceps femoris (long head) Declined    _______      R. Gluteus maximus Declined    _______      R. Gluteus medius Declined    _______

## 2021-08-23 NOTE — Progress Notes (Signed)
See procedure note.

## 2021-08-23 NOTE — Progress Notes (Signed)
Full Name: Martha Koch Gender: Female MRN #: [id number] Date of Birth: 11-02-1955    Visit Date: 08/22/2021 14:43 Age: 66 Years Examining Physician: Sarina Ill, MD  Requesting Provider: Altamese [place], MD Primary Care Provider:  Charlott Rakes, MD   Height: 5 feet 7 inch 130lbs Patient History: Patient with generalized right leg weakness and atrophy throughout the right leg proximal and distal.  No other limbs are involved.    Summary: The right saphenous sensory nerve showed decreased amplitude (2uv, N>5).  The left saphenous sensory nerve showed decreased amplitude (3uv, N>5).  However these were technically difficult due to patient's intolerance to the exam and likely not accurate. All remaining nerves (as indicated in the following tables) were within normal limits.  All muscles showed decreased insertional due to atrophy but otherwise motor unit amplitude, recruitment, duration and morphology were normal.  Unfortunately could not complete the exam due to pain and patient's inability to stay still.     Conclusion:Patient with generalized right leg weakness and atrophy throughout the right leg both proximal and distal.  No other limbs are involved.  EMG nerve conduction study was performed on the right lower extremity.  Unfortunately patient could not tolerate most of the exam due to severe pain.  Some of the conductions were technically difficult due to patient's yelling out in pain and moving.  All nerve conductions were within normal limits except for the saphenous sensory nerves which were slightly decreased likely due to technical difficulties discussed above.  All muscles showed decreased insertional activity due to atrophy but otherwise motor unit amplitude, duration, recruitment and morphology were normal.  Unfortunately could not complete the exam.  At this time we will request a muscle biopsy.     ------------------------------- Sarina Ill, M.D.  [facility] [address], [place], [place] [address] Tel: [phone] Fax: [phone]  Verbal informed consent was obtained from the patient, patient was informed of potential risk of procedure, including bruising, bleeding, hematoma formation, infection, muscle weakness, muscle pain, numbness, among others.        [place]    Nerve / Sites Muscle Latency Ref. Amplitude Ref. Rel Amp Segments Distance Velocity Ref. Area    ms ms mV mV %  cm m/s m/s mVms  R Peroneal - EDB     Ankle EDB 4.1 ?6.5 5.5 ?2.0 100 Ankle - EDB 9   14.2     Fib head EDB 10.3  5.1  92.4 Fib head - Ankle 28 45 ?44 14.0     Pop fossa EDB 12.5  5.1  100 Pop fossa - Fib head 10 45 ?44 14.2         Pop fossa - Ankle      L Peroneal - EDB     Ankle EDB 3.6 ?6.5 3.6 ?2.0 100 Ankle - EDB 9   12.3     Fib head EDB 9.7  3.3  92.3 Fib head - Ankle 27 44 ?44 11.9     Pop fossa EDB 12.0  3.2  97.1 Pop fossa - Fib head 10 44 ?44 11.6         Pop fossa - Ankle      R Tibial - AH     Ankle AH 3.8 ?5.8 10.2 ?4.0 100 Ankle - AH 9   26.3     Pop fossa AH 12.4  8.6  84.6 Pop fossa - Ankle 37 43 ?41 25.5  L Tibial - AH  Ankle AH 3.7 ?5.8 8.3 ?4.0 100 Ankle - AH 9   20.5     Pop fossa AH 12.2  7.0  84.8 Pop fossa - Ankle 38 45 ?41 20.2  R Femoral - Vastus Med     B. Ing Lig Vastus Med 2.6  6.0  100 B. Ing Lig - Vastus Med    46.5     A. Ing Lig Vastus Med 4.3  4.7  79.1 A. Ing Lig - B. Ing Lig 10 59  34.3  L Femoral - Vastus Med     B. Ing Lig Vastus Med 2.9  10.2  100 B. Ing Lig - Vastus Med    60.0     A. Ing Lig Vastus Med 4.1  8.3  81 A. Ing Lig - B. Ing Lig 10 83  52.5                 SNC    Nerve / Sites Rec. Site Peak Lat Ref.  Amp Ref. Segments Distance    ms ms V V  cm  R Sural - Ankle (Calf)     Calf Ankle 3.5 ?4.4 10 ?6 Calf - Ankle 14  L Sural - Ankle (Calf)     Calf Ankle 3.4 ?4.4 9 ?6 Calf - Ankle 14  R Superficial peroneal - Ankle     Lat leg Ankle 3.1 ?4.4 7 ?6 Lat leg - Ankle 14  L Superficial  peroneal - Ankle     Lat leg Ankle 3.6 ?4.4 6 ?6 Lat leg - Ankle 14  R Saphenous - Ankle (Medial leg)     Medial leg Ankle 3.7 ?4.2 3 ?5 Medial leg - Ankle 14  L Saphenous - Ankle (Medial leg)     Medial leg Ankle 3.4 ?4.2 2 ?5 Medial leg - Ankle 14                 F  Wave    Nerve F Lat Ref.   ms ms  R Tibial - AH 51.3 ?56.0  L Tibial - AH 53.1 ?56.0         EMG Summary Table    Spontaneous MUAP Recruitment  Muscle IA Fib PSW Fasc Other Amp Dur. Poly Pattern  R. Iliopsoas Decreased None None None _______ Normal Normal Normal Normal  R. Lumbar paraspinals (low) Declined    _______      R. Vastus lateralis Decreased None None None _______ Normal Normal Normal Normal  R. Vastus medialis Decreased None None None _______ Normal Normal Normal Normal  R. Adductor longus Declined    _______      R. Gastrocnemius (Medial head) Decreased None None None _______ Normal Normal Normal Normal  R. Tibialis anterior Decreased None None None _______ Normal Normal Normal Normal  R. Extensor hallucis longus Decreased None None None _______ Normal Normal Normal Normal  R. Abductor hallucis Declined    _______      R. Biceps femoris (long head) Declined    _______      R. Gluteus maximus Declined    _______      R. Gluteus medius Declined    _______

## 2021-08-28 ENCOUNTER — Other Ambulatory Visit: Payer: Self-pay | Admitting: General Surgery

## 2021-09-04 NOTE — Progress Notes (Signed)
Surgical Instructions ? ? ?Your procedure is scheduled on Thursday 09/14/2021. ? ?Report to [facility] Main Entrance "A" at 10:00 A.M., then check in with the Admitting office. ? ?Call [phone] if you have problems or questions between now and the morning of surgery: ?  ?Remember: ?Do not eat after midnight the night before your surgery ? ?You may drink clear liquids until 09:00a.m. the morning of your surgery.   ?Clear liquids allowed are: Water, Non-Citrus Juices (without pulp), Carbonated Beverages, Clear Tea, Black Coffee Only (NO MILK, CREAM, or POWDERED CREAMER of any kind), and Gatorade ?  ?Take these medicines the morning of surgery with A SIP OF WATER:  ?Amlodipine (Norvasc) ?Atorvastatin (Lipitor) ? ?As of today, STOP taking any Aspirin (unless otherwise instructed by your surgeon) or Aspirin-containing products; NSAIDS - Aleve, Naproxen, Ibuprofen, Motrin, Advil, Goody's, BC's, all herbal medications, fish oil, and all vitamins. ? ? ?3 days leading up to your surgery  ? ?You are not required to quarantine however you are required to wear a well-fitting mask when you are out and around people not in your household.  If your mask becomes wet or soiled, replace with a new one. ? ?Wash your hands often with soap and water for 20 seconds or clean your hands with an alcohol-based hand sanitizer that contains at least 60% alcohol. ? ?Do not share personal items. ? ?Notify your provider: ?if you are in close contact with someone who has COVID  ?or if you develop a fever of 100.4 or greater, sneezing, cough, sore throat, shortness of breath or body aches. ?         ?Do not wear jewelry or makeup ? ?Do not wear lotions, powders, perfumes, or deodorant. ? ?Do not shave 48 hours prior to surgery.   ? ?Do not wear nail polish, gel polish, artificial nails, or any other type of covering on natural nails including fingernails and toenails. ?If patients have artificial nails, gel coating, etc. that need to be removed  by a nail salon please have this removed prior to surgery or surgery may need to be canceled/delayed if the surgeon/ anesthesia feels like the patient is unable to be adequately monitored. ? ?Do not bring valuables to the hospital - [facility] is not responsible for any belongings or valuables. ? ?Do NOT Smoke (Tobacco/Vaping) or drink Alcohol 24 hours prior to your procedure ? ?If you use a CPAP at night, please bring your mask for your overnight stay. ?  ?Contacts, glasses, hearing aids, dentures or partials may not be worn into surgery, please bring cases for these belongings ?  ?For patients admitted to the hospital, discharge time will be determined by your treatment team. ?  ?Patients discharged the day of surgery will not be allowed to drive home, and someone needs to stay with them for 24 hours. ? ?NO VISITORS WILL BE ALLOWED IN PRE-OP WHERE PATIENTS ARE PREPPED FOR SURGERY.  ONLY 1 SUPPORT PERSON MAY BE PRESENT IN THE WAITING ROOM WHILE YOU ARE IN SURGERY.  IF YOU ARE TO BE ADMITTED, ONCE YOU ARE IN YOUR ROOM YOU WILL BE ALLOWED TWO (2) VISITORS. 1 (ONE) VISITOR MAY STAY OVERNIGHT BUT MUST ARRIVE TO THE ROOM BY 8pm.  Minor children may have two parents present. Special consideration for safety and communication needs will be reviewed on a case by case basis. ? ?Special instructions:   ? ?Oral Hygiene is also important to reduce your risk of infection.  Remember - BRUSH YOUR TEETH  THE MORNING OF SURGERY WITH YOUR REGULAR TOOTHPASTE ? ? ?[facility]- Preparing For Surgery ? ?Before surgery, you can play an important role. Because skin is not sterile, your skin needs to be as free of germs as possible. You can reduce the number of germs on your skin by washing with CHG (chlorahexidine gluconate) Soap before surgery.  CHG is an antiseptic cleaner which kills germs and bonds with the skin to continue killing germs even after washing.   ? ? ?Please do not use if you have an allergy to CHG or antibacterial soaps.  If your skin becomes reddened/irritated stop using the CHG.  ?Do not shave (including legs and underarms) for at least 48 hours prior to first CHG shower. It is OK to shave your face. ? ?Please follow these instructions carefully. ?  ? ? Shower the NIGHT BEFORE SURGERY and the MORNING OF SURGERY with CHG Soap.  ? ?If you chose to wash your hair, wash your hair first as usual with your normal shampoo. After you shampoo, rinse your hair and body thoroughly to remove the shampoo.   ? ?Then Wash Face and genitals (private parts) with your normal soap and rinse thoroughly to remove soap. ? ?Next use the CHG Soap as you would any other liquid soap. You can apply CHG directly to the skin and wash gently with a clean washcloth.  ? ?Apply the CHG Soap to your body ONLY FROM THE NECK DOWN.  Do not use on open wounds or open sores. Avoid contact with your eyes, ears, mouth and genitals (private parts). Wash Face and genitals (private parts)  with your normal soap.  ? ?Wash thoroughly, paying special attention to the area where your surgery will be performed. ? ?Thoroughly rinse your body with warm water from the neck down. ? ?DO NOT shower/wash with your normal soap after using and rinsing off the CHG Soap. ? ?Pat yourself dry with a CLEAN TOWEL. ? ?Wear CLEAN PAJAMAS to bed the night before surgery ? ?Place CLEAN SHEETS on your bed the night before your surgery ? ?DO NOT SLEEP WITH PETS. ? ? ?Day of Surgery: ? ?Take a shower with CHG soap. ?Wear Clean/Comfortable clothing the morning of surgery ?Do not apply any deodorants/lotions.   ?Remember to brush your teeth WITH YOUR REGULAR TOOTHPASTE. ?  ?Please read over the fact sheets that you were given.   ?

## 2021-09-05 ENCOUNTER — Encounter (HOSPITAL_COMMUNITY)
Admission: RE | Admit: 2021-09-05 | Discharge: 2021-09-05 | Disposition: A | Discharge status: Home / Self Care | Payer: Medicare Other | Source: Ambulatory Visit | Attending: General Surgery | Admitting: General Surgery

## 2021-09-05 ENCOUNTER — Encounter (HOSPITAL_COMMUNITY): Payer: Self-pay

## 2021-09-05 ENCOUNTER — Other Ambulatory Visit: Payer: Self-pay

## 2021-09-05 VITALS — BP 151/83 | HR 85 | Temp 98.2°F | Resp 17 | Ht 67.0 in | Wt 128.3 lb

## 2021-09-05 DIAGNOSIS — Z01818 Encounter for other preprocedural examination: Secondary | ICD-10-CM | POA: Insufficient documentation

## 2021-09-05 DIAGNOSIS — I10 Essential (primary) hypertension: Secondary | ICD-10-CM

## 2021-09-05 LAB — CBC
HCT: 39.9 % (ref 36.0–46.0)
Hemoglobin: 12.9 g/dL (ref 12.0–15.0)
MCH: 27.1 pg (ref 26.0–34.0)
MCHC: 32.3 g/dL (ref 30.0–36.0)
MCV: 83.8 fL (ref 80.0–100.0)
Platelets: 248 10*3/uL (ref 150–400)
RBC: 4.76 MIL/uL (ref 3.87–5.11)
RDW: 15 % (ref 11.5–15.5)
WBC: 4.5 10*3/uL (ref 4.0–10.5)
nRBC: 0 % (ref 0.0–0.2)

## 2021-09-05 LAB — BASIC METABOLIC PANEL
Anion gap: 8 (ref 5–15)
BUN: 11 mg/dL (ref 8–23)
CO2: 25 mmol/L (ref 22–32)
Calcium: 9.3 mg/dL (ref 8.9–10.3)
Chloride: 107 mmol/L (ref 98–111)
Creatinine, Ser: 0.91 mg/dL (ref 0.44–1.00)
GFR, Estimated: >60 mL/min (ref >60)
Glucose, Bld: 89 mg/dL (ref 70–99)
Potassium: 3.6 mmol/L (ref 3.5–5.1)
Sodium: 140 mmol/L (ref 135–145)

## 2021-09-05 NOTE — Progress Notes (Signed)
PCP - Martha Rakes MD ?Cardiologist - Denies ? ?Chest x-ray - Not indicated ?EKG - 09/05/21 ?Stress Test - Denies ?ECHO - Denies ?Cardiac Cath - Denies ? ?Sleep Study - Denies ? ?DM - Denies ? ?ERAS Protcol - Yes ? ?COVID TEST- Not indicated ? ? ?Anesthesia review: No ? ?Patient denies shortness of breath, fever, cough and chest pain at PAT appointment ? ? ?All instructions explained to the patient, with a verbal understanding of the material. Patient agrees to go over the instructions while at home for a better understanding.  The opportunity to ask questions was provided. ? ? ?

## 2021-09-13 NOTE — H&P (Signed)
? ?REFERRING PHYSICIAN: Melvenia Beam, MD ? ?PROVIDER: Georgianne Fick, MD ? ?MRN: [id number] ?DOB: 05-23-1956 ?DATE OF ENCOUNTER: 08/28/2021 ?Subjective  ? ?Chief Complaint: Muscle Biopsy ? ? ?History of Present Illness: ?Martha Koch is a 66 y.o. female who is seen today as an office consultation for evaluation of Muscle Biopsy ?Marland Kitchen  ?Patient is a 66 year old female who has had approximately 1 year of worsening right leg weakness and pain. She has been seeing neurology for work-up. She has had nerve conduction studies which were difficult due to patient's inability to tolerate. Imaging demonstrates significant atrophy of the right iliopsoas, vastus lateralis, gastrocnemius, and tibialis anterior. Muscle biopsy is requested. 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 She has been having increasing difficulty walking and standing. She has noticed recently that the left side has starting to hurt more and feels like it is starting to have atrophy as well. ? ?Review of Systems: ?A complete review of systems was obtained from the patient. I have reviewed this information and discussed as appropriate with the patient. See HPI as well for other ROS. ? ?Review of Systems  ?All other systems reviewed and are negative. ? ? ?Medical History: ?Past Medical History:  ?Diagnosis Date  ? History of cancer  ? Hypertension  ? ?Patient Active Problem List  ?Diagnosis  ? Atrophy of muscle of right thigh  ? ?Past Surgical History:  ?Procedure Laterality Date  ? HYSTERECTOMY 2016  ? ? ?Allergies  ?Allergen Reactions  ? Penicillins Hives  ?REACTION: hives ? ? Aspirin Other (See Comments)  ?REACTION: stomach ?REACTION: Stomach Cramping  ? ? ?Current Outpatient Medications on File Prior to Visit  ?Medication Sig Dispense Refill  ? amLODIPine (NORVASC) 10 MG tablet Take 1 tablet by mouth once daily  ? atorvastatin (LIPITOR) 20 MG tablet Take 20 mg by mouth once daily  ? ascorbic acid, vitamin C, (VITAMIN C) 1000  MG tablet Take by mouth  ? cholecalciferol (VITAMIN D3) 1000 unit tablet Take by mouth  ? ?No current facility-administered medications on file prior to visit.  ? ?Family History  ?Problem Relation Age of Onset  ? High blood pressure (Hypertension) Father  ? Coronary Artery Disease (Blocked arteries around heart) Father  ? Diabetes Father  ? ? ?Social History  ? ?Tobacco Use  ?Smoking Status Every Day  ? Packs/day: 0.50  ? Types: Cigarettes  ?Smokeless Tobacco Never  ? ? ?Social History  ? ?Socioeconomic History  ? Marital status: Single  ?Tobacco Use  ? Smoking status: Every Day  ?Packs/day: 0.50  ?Types: Cigarettes  ? Smokeless tobacco: Never  ?Substance and Sexual Activity  ? Alcohol use: Defer  ? Drug use: Defer  ? ?Objective:  ? ?Vitals:  ?08/28/21 1010  ?BP: 138/82  ?Pulse: (!) 114  ?Temp: 36.3 ?C (97.3 ?F)  ?SpO2: 97%  ?Weight: 58 kg (127 lb 12.8 oz)  ?Height: 170.2 cm ('5\' 7"'$ )  ? ?Body mass index is 20.02 kg/m?. ? ?Head: Normocephalic and atraumatic.  ?Mouth/Throat: Oropharynx is clear and moist. No oropharyngeal exudate.  ?Eyes: Conjunctivae are normal. Pupils are equal, round, and reactive to light. No scleral icterus.  ?Neck: Normal range of motion. Neck supple. No tracheal deviation present. No thyromegaly present.  ?Cardiovascular: Normal rate, regular rhythm, normal heart sounds and intact distal pulses. Exam reveals no gallop and no friction rub.  ?No murmur heard. ?Respiratory: Effort normal and breath sounds normal. No respiratory distress. No wheezes, rales or  rhonchi. No chest wall tenderness.  ?GI: Soft. Bowel sounds are normal. Abdomen is soft, non tender, non distended. No masses or hepatosplenomegaly is present. There is no rebound and no guarding.  ?Musculoskeletal: . Extremities are non tender and without MSK deformity. There is a noticeable difference in size of thigh and lower leg from right to left.  ?Lymphadenopathy: No cervical or axillary adenopathy.  ?Neurological: Alert and oriented  to person, place, and time. Coordination normal.  ?Skin: Skin is warm and dry. No rash noted. No diaphoresis. No erythema. No pallor.  ?Psychiatric: Normal mood and affect.Behavior is normal. Judgment and thought content normal.  ? ?Assessment and Plan:  ? ?Diagnoses and all orders for this visit: ? ?Atrophy of muscle of right thigh ?Assessment & Plan: ?Plan muscle biopsy ? ?Given patient's muscle atrophy of the right thigh and right lower leg, will plan biopsy of both sites. The iliopsoas is too deep to access easily. We will do a thigh biopsy of the vastus lateralis and a lower leg gastrocnemius biopsy versus tibialis anterior biopsy. I prefer the gastrocnemius as there is higher incidence of potential for nerve damage in the tibialis anterior compartment. ? ?I discussed the procedure with the patient. I reviewed the risk of surgery including bleeding, infection, wound problems, nerve damage, chronic numbness, heart or lung issues. I discussed that the biopsy is for Dr. Lavell Anchors to interpret and that I do not treat muscle or neurologic diseases. The patient understands and wishes to proceed. She is going to try to work this into her work schedule in order to minimize disruption to her colleagues.  ?

## 2021-09-14 ENCOUNTER — Ambulatory Visit (HOSPITAL_COMMUNITY): Payer: Medicare Other | Admitting: Anesthesiology

## 2021-09-14 ENCOUNTER — Encounter (HOSPITAL_COMMUNITY)
Admission: RE | Disposition: A | Discharge status: Home / Self Care | Payer: Self-pay | Source: Ambulatory Visit | Attending: General Surgery

## 2021-09-14 ENCOUNTER — Encounter (HOSPITAL_COMMUNITY): Payer: Self-pay | Admitting: General Surgery

## 2021-09-14 ENCOUNTER — Ambulatory Visit (HOSPITAL_BASED_OUTPATIENT_CLINIC_OR_DEPARTMENT_OTHER): Payer: Medicare Other | Admitting: Anesthesiology

## 2021-09-14 ENCOUNTER — Ambulatory Visit (HOSPITAL_COMMUNITY)
Admission: RE | Admit: 2021-09-14 | Discharge: 2021-09-14 | Disposition: A | Discharge status: Home / Self Care | Payer: Medicare Other | Source: Ambulatory Visit | Attending: General Surgery | Admitting: General Surgery

## 2021-09-14 ENCOUNTER — Other Ambulatory Visit: Payer: Self-pay

## 2021-09-14 DIAGNOSIS — C50912 Malignant neoplasm of unspecified site of left female breast: Secondary | ICD-10-CM | POA: Diagnosis not present

## 2021-09-14 DIAGNOSIS — J449 Chronic obstructive pulmonary disease, unspecified: Secondary | ICD-10-CM

## 2021-09-14 DIAGNOSIS — F17210 Nicotine dependence, cigarettes, uncomplicated: Secondary | ICD-10-CM | POA: Insufficient documentation

## 2021-09-14 DIAGNOSIS — I10 Essential (primary) hypertension: Secondary | ICD-10-CM | POA: Diagnosis not present

## 2021-09-14 DIAGNOSIS — M6259 Muscle wasting and atrophy, not elsewhere classified, multiple sites: Secondary | ICD-10-CM | POA: Insufficient documentation

## 2021-09-14 DIAGNOSIS — R531 Weakness: Secondary | ICD-10-CM | POA: Diagnosis present

## 2021-09-14 HISTORY — PX: MUSCLE BIOPSY: SHX716

## 2021-09-14 SURGERY — MUSCLE BIOPSY
Anesthesia: General | Laterality: Right

## 2021-09-14 MED ORDER — LACTATED RINGERS IV SOLN: INTRAVENOUS | Status: DC

## 2021-09-14 MED ORDER — ORAL CARE MOUTH RINSE: 15.0000 mL | Freq: Once | OROMUCOSAL | Status: AC

## 2021-09-14 MED ORDER — PHENYLEPHRINE 40 MCG/ML (10ML) SYRINGE FOR IV PUSH (FOR BLOOD PRESSURE SUPPORT)
PREFILLED_SYRINGE | INTRAVENOUS | Status: AC
  Filled 2021-09-14: qty 10

## 2021-09-14 MED ORDER — OXYCODONE HCL 5 MG PO TABS
5.0000 mg | ORAL_TABLET | Freq: Four times a day (QID) | ORAL | 0 refills | Status: DC | PRN
  Filled 2021-09-14: qty 10, 3d supply, fill #0

## 2021-09-14 MED ORDER — ACETAMINOPHEN 500 MG PO TABS
1000.0000 mg | ORAL_TABLET | ORAL | Status: AC
  Administered 2021-09-14: 1000 mg via ORAL
  Filled 2021-09-14: qty 2

## 2021-09-14 MED ORDER — DEXAMETHASONE SODIUM PHOSPHATE 10 MG/ML IJ SOLN
INTRAMUSCULAR | Status: DC | PRN
  Administered 2021-09-14: 10 mg via INTRAVENOUS

## 2021-09-14 MED ORDER — OXYCODONE HCL 5 MG PO TABS: 5.0000 mg | ORAL_TABLET | Freq: Four times a day (QID) | ORAL | 0 refills | Status: DC | PRN

## 2021-09-14 MED ORDER — MIDAZOLAM HCL 2 MG/2ML IJ SOLN
INTRAMUSCULAR | Status: DC | PRN
  Administered 2021-09-14: 2 mg via INTRAVENOUS

## 2021-09-14 MED ORDER — LIDOCAINE HCL 1 % IJ SOLN
INTRAMUSCULAR | Status: DC | PRN
  Administered 2021-09-14: 16 mL via INTRADERMAL

## 2021-09-14 MED ORDER — CHLORHEXIDINE GLUCONATE CLOTH 2 % EX PADS: 6.0000 | MEDICATED_PAD | Freq: Once | CUTANEOUS | Status: DC

## 2021-09-14 MED ORDER — MIDAZOLAM HCL 2 MG/2ML IJ SOLN
INTRAMUSCULAR | Status: AC
  Filled 2021-09-14: qty 2

## 2021-09-14 MED ORDER — PHENYLEPHRINE 40 MCG/ML (10ML) SYRINGE FOR IV PUSH (FOR BLOOD PRESSURE SUPPORT)
PREFILLED_SYRINGE | INTRAVENOUS | Status: DC | PRN
  Administered 2021-09-14 (×4): 80 ug via INTRAVENOUS

## 2021-09-14 MED ORDER — CIPROFLOXACIN IN D5W 400 MG/200ML IV SOLN
400.0000 mg | INTRAVENOUS | Status: AC
  Administered 2021-09-14: 400 mg via INTRAVENOUS
  Filled 2021-09-14: qty 200

## 2021-09-14 MED ORDER — LIDOCAINE 2% (20 MG/ML) 5 ML SYRINGE
INTRAMUSCULAR | Status: DC | PRN
  Administered 2021-09-14: 60 mg via INTRAVENOUS

## 2021-09-14 MED ORDER — DIPHENHYDRAMINE HCL 50 MG/ML IJ SOLN
INTRAMUSCULAR | Status: AC
  Filled 2021-09-14: qty 1

## 2021-09-14 MED ORDER — ONDANSETRON HCL 4 MG/2ML IJ SOLN
INTRAMUSCULAR | Status: AC
  Filled 2021-09-14: qty 2

## 2021-09-14 MED ORDER — OXYCODONE HCL 5 MG PO TABS
ORAL_TABLET | ORAL | Status: AC
  Filled 2021-09-14: qty 1

## 2021-09-14 MED ORDER — DEXAMETHASONE SODIUM PHOSPHATE 10 MG/ML IJ SOLN
INTRAMUSCULAR | Status: AC
  Filled 2021-09-14: qty 1

## 2021-09-14 MED ORDER — CHLORHEXIDINE GLUCONATE 0.12 % MT SOLN
15.0000 mL | Freq: Once | OROMUCOSAL | Status: AC
  Administered 2021-09-14: 15 mL via OROMUCOSAL
  Filled 2021-09-14: qty 15

## 2021-09-14 MED ORDER — FENTANYL CITRATE (PF) 250 MCG/5ML IJ SOLN
INTRAMUSCULAR | Status: AC
  Filled 2021-09-14: qty 5

## 2021-09-14 MED ORDER — ONDANSETRON HCL 4 MG/2ML IJ SOLN
INTRAMUSCULAR | Status: DC | PRN
  Administered 2021-09-14: 4 mg via INTRAVENOUS

## 2021-09-14 MED ORDER — EPHEDRINE SULFATE-NACL 50-0.9 MG/10ML-% IV SOSY
PREFILLED_SYRINGE | INTRAVENOUS | Status: DC | PRN
  Administered 2021-09-14: 10 mg via INTRAVENOUS
  Administered 2021-09-14: 5 mg via INTRAVENOUS

## 2021-09-14 MED ORDER — LIDOCAINE 2% (20 MG/ML) 5 ML SYRINGE
INTRAMUSCULAR | Status: AC
  Filled 2021-09-14: qty 5

## 2021-09-14 MED ORDER — BUPIVACAINE-EPINEPHRINE (PF) 0.25% -1:200000 IJ SOLN
INTRAMUSCULAR | Status: AC
  Filled 2021-09-14: qty 30

## 2021-09-14 MED ORDER — PROPOFOL 10 MG/ML IV BOLUS
INTRAVENOUS | Status: DC | PRN
  Administered 2021-09-14: 150 mg via INTRAVENOUS

## 2021-09-14 MED ORDER — LIDOCAINE HCL (PF) 1 % IJ SOLN
INTRAMUSCULAR | Status: AC
  Filled 2021-09-14: qty 30

## 2021-09-14 MED ORDER — EPHEDRINE 5 MG/ML INJ
INTRAVENOUS | Status: AC
  Filled 2021-09-14: qty 5

## 2021-09-14 MED ORDER — OXYCODONE HCL 5 MG PO TABS
5.0000 mg | ORAL_TABLET | Freq: Once | ORAL | Status: AC
  Administered 2021-09-14: 5 mg via ORAL

## 2021-09-14 MED ORDER — FENTANYL CITRATE (PF) 250 MCG/5ML IJ SOLN
INTRAMUSCULAR | Status: DC | PRN
  Administered 2021-09-14: 50 ug via INTRAVENOUS

## 2021-09-14 MED ORDER — DIPHENHYDRAMINE HCL 50 MG/ML IJ SOLN
INTRAMUSCULAR | Status: DC | PRN
  Administered 2021-09-14: 12.5 mg via INTRAVENOUS

## 2021-09-14 SURGICAL SUPPLY — 37 items
ADH SKN CLS APL DERMABOND .7 (GAUZE/BANDAGES/DRESSINGS) ×1
BAG COUNTER SPONGE SURGICOUNT (BAG) ×3 IMPLANT
BAG SPNG CNTER NS LX DISP (BAG) ×1
CANISTER SUCT 3000ML PPV (MISCELLANEOUS) IMPLANT
CHLORAPREP W/TINT 10.5 ML (MISCELLANEOUS) ×3 IMPLANT
CNTNR URN SCR LID CUP LEK RST (MISCELLANEOUS) ×2 IMPLANT
CONT SPEC 4OZ STRL OR WHT (MISCELLANEOUS) ×2
COVER SURGICAL LIGHT HANDLE (MISCELLANEOUS) ×3 IMPLANT
DERMABOND ADVANCED (GAUZE/BANDAGES/DRESSINGS) ×1
DERMABOND ADVANCED .7 DNX12 (GAUZE/BANDAGES/DRESSINGS) ×2 IMPLANT
DRAPE LAPAROTOMY 100X72 PEDS (DRAPES) ×3 IMPLANT
DRSG TELFA 3X8 NADH (GAUZE/BANDAGES/DRESSINGS) ×2 IMPLANT
ELECT REM PT RETURN 9FT ADLT (ELECTROSURGICAL) ×2
ELECTRODE REM PT RTRN 9FT ADLT (ELECTROSURGICAL) ×2 IMPLANT
GAUZE 4X4 16PLY ~~LOC~~+RFID DBL (SPONGE) ×3 IMPLANT
GLOVE SURG ENC MOIS LTX SZ6 (GLOVE) ×3 IMPLANT
GLOVE SURG UNDER LTX SZ6.5 (GLOVE) ×3 IMPLANT
GOWN STRL REUS W/ TWL LRG LVL3 (GOWN DISPOSABLE) ×2 IMPLANT
GOWN STRL REUS W/TWL 2XL LVL3 (GOWN DISPOSABLE) ×6 IMPLANT
GOWN STRL REUS W/TWL LRG LVL3 (GOWN DISPOSABLE) ×2
KIT BASIN OR (CUSTOM PROCEDURE TRAY) ×3 IMPLANT
KIT TURNOVER KIT B (KITS) ×3 IMPLANT
NDL HYPO 25GX1X1/2 BEV (NEEDLE) ×2 IMPLANT
NEEDLE HYPO 25GX1X1/2 BEV (NEEDLE) ×2 IMPLANT
NS IRRIG 1000ML POUR BTL (IV SOLUTION) ×3 IMPLANT
PACK GENERAL/GYN (CUSTOM PROCEDURE TRAY) ×3 IMPLANT
PAD ARMBOARD 7.5X6 YLW CONV (MISCELLANEOUS) ×6 IMPLANT
PAD DRESSING TELFA 3X8 NADH (GAUZE/BANDAGES/DRESSINGS) ×2 IMPLANT
PENCIL SMOKE EVACUATOR (MISCELLANEOUS) ×3 IMPLANT
SUT MON AB 4-0 PC3 18 (SUTURE) ×3 IMPLANT
SUT VIC AB 2-0 SH 27 (SUTURE) ×2
SUT VIC AB 2-0 SH 27X BRD (SUTURE) IMPLANT
SUT VIC AB 3-0 SH 18 (SUTURE) ×3 IMPLANT
SUT VIC AB 3-0 SH 8-18 (SUTURE) ×1 IMPLANT
SYR CONTROL 10ML LL (SYRINGE) ×3 IMPLANT
TOWEL GREEN STERILE (TOWEL DISPOSABLE) ×3 IMPLANT
TOWEL GREEN STERILE FF (TOWEL DISPOSABLE) ×3 IMPLANT

## 2021-09-14 NOTE — Discharge Instructions (Addendum)
[facility] ?Office Phone Number [phone] ? ? POST OP INSTRUCTIONS ? ?Always review your discharge instruction sheet given to you by the facility where your surgery was performed. ? ?IF YOU HAVE DISABILITY OR FAMILY LEAVE FORMS, YOU MUST BRING THEM TO THE OFFICE FOR PROCESSING.  DO NOT GIVE THEM TO YOUR DOCTOR. ? ?Take 2 tylenol (acetominophen) three times a day for 3 days.  If you still have pain, add ibuprofen with food in between if able to take this (if you have kidney issues or stomach issues, do not take ibuprofen).  If both of those are not enough, add the narcotic pain pill.  If you find you are needing a lot of this overnight after surgery, call the next morning for a refill.   ?Take your usually prescribed medications unless otherwise directed ?If you need a refill on your pain medication, please contact your pharmacy.  They will contact our office to request authorization.  Prescriptions will not be filled after 5pm or on week-ends. ?You should eat very light the first 24 hours after surgery, such as soup, crackers, pudding, etc.  Resume your normal diet the day after surgery ?It is common to experience some constipation if taking pain medication after surgery.  Increasing fluid intake and taking a stool softener will usually help or prevent this problem from occurring.  A mild laxative (Milk of Magnesia or Miralax) should be taken according to package directions if there are no bowel movements after 48 hours. ?You may shower in 48 hours.  The surgical glue will flake off in 2-3 weeks.   ?ACTIVITIES:  No strenuous activity or heavy lifting for 1 week.   ?You may drive when you no longer are taking prescription pain medication, you can comfortably wear a seatbelt, and you can safely maneuver your car and apply brakes. ?RETURN TO WORK:  __________Next week _______________ ?You should see your doctor in the office for a follow-up appointment approximately three-four weeks after your  surgery.   ? ?WHEN TO CALL YOUR DOCTOR: ?Fever over 101.0 ?Nausea and/or vomiting. ?Extreme swelling or bruising. ?Continued bleeding from incision. ?Increased pain, redness, or drainage from the incision. ? ?The clinic staff is available to answer your questions during regular business hours.  Please don?t hesitate to call and ask to speak to one of the nurses for clinical concerns.  If you have a medical emergency, go to the nearest emergency room or call 911.  A surgeon from [facility] is always on call at the hospital. ? ?For further questions, please visit [email or web address]  ? ?

## 2021-09-14 NOTE — Anesthesia Procedure Notes (Signed)
Procedure Name: LMA Insertion ?Date/Time: 09/14/2021 12:38 PM ?Performed by: Kyung Rudd, CRNA ?Pre-anesthesia Checklist: Patient identified, Emergency Drugs available, Suction available and Patient being monitored ?Patient Re-evaluated:Patient Re-evaluated prior to induction ?Oxygen Delivery Method: Circle System Utilized ?Preoxygenation: Pre-oxygenation with 100% oxygen ?Induction Type: IV induction ?LMA: LMA inserted ?LMA Size: 4.0 ?Number of attempts: 1 ?Placement Confirmation: positive ETCO2 and breath sounds checked- equal and bilateral ?Tube secured with: Tape ?Dental Injury: Teeth and Oropharynx as per pre-operative assessment  ? ? ? ? ?

## 2021-09-14 NOTE — Anesthesia Preprocedure Evaluation (Addendum)
Anesthesia Evaluation  ?Patient identified by MRN, date of birth, ID band ?Patient awake ? ? ? ?Reviewed: ?Allergy & Precautions, NPO status , Patient's Chart, lab work & pertinent test results ? ?Airway ?Mallampati: II ? ?TM Distance: >3 FB ?Neck ROM: Full ? ? ? Dental ?no notable dental hx. ? ?  ?Pulmonary ?COPD, Current Smoker and Patient abstained from smoking.,  ?  ?Pulmonary exam normal ?breath sounds clear to auscultation ? ? ? ? ? ? Cardiovascular ?hypertension, Pt. on medications ?negative cardio ROS ?Normal cardiovascular exam ?Rhythm:Regular Rate:Normal ? ? ?  ?Neuro/Psych ? Neuromuscular disease (muscle atrophy right leg) negative psych ROS  ? GI/Hepatic ?negative GI ROS, Neg liver ROS,   ?Endo/Other  ? ? Renal/GU ?negative Renal ROS  ?negative genitourinary ?  ?Musculoskeletal ?negative musculoskeletal ROS ?(+)  ? Abdominal ?  ?Peds ?negative pediatric ROS ?(+)  Hematology ?negative hematology ROS ?(+)   ?Anesthesia Other Findings ? ? Reproductive/Obstetrics ?negative OB ROS ? ?  ? ? ? ? ? ? ? ? ? ? ? ? ? ?  ?  ? ? ? ? ? ? ?Anesthesia Physical ?Anesthesia Plan ? ?ASA: 3 ? ?Anesthesia Plan: General  ? ?Post-op Pain Management:   ? ?Induction:  ? ?PONV Risk Score and Plan: 2 and Treatment may vary due to age or medical condition and Ondansetron ? ?Airway Management Planned: LMA ? ?Additional Equipment: None ? ?Intra-op Plan:  ? ?Post-operative Plan: Extubation in OR ? ?Informed Consent: I have reviewed the patients History and Physical, chart, labs and discussed the procedure including the risks, benefits and alternatives for the proposed anesthesia with the patient or authorized representative who has indicated his/her understanding and acceptance.  ? ? ? ?Dental advisory given ? ?Plan Discussed with: Anesthesiologist and CRNA ? ?Anesthesia Plan Comments:   ? ? ? ? ?Anesthesia Quick Evaluation ? ?

## 2021-09-14 NOTE — Op Note (Addendum)
PRE-OPERATIVE DIAGNOSIS: Weakness ? ?POST-OPERATIVE DIAGNOSIS:  Same ? ?PROCEDURE:  Procedure(s): ?Right thigh and right calf muscle biopsy ? ?SURGEON:  Surgeon(s): ?Stark Klein, MD ? ?Assist: Pryor Curia, RNFA ? ?ANESTHESIA:   local and general ? ?DRAINS: none  ? ?LOCAL MEDICATIONS USED:  MARCAINE    and LIDOCAINE  ? ?SPECIMEN:  Source of Specimen:  segment of right vastus lateralis, segment of right gastrocnemius ? ?DISPOSITION OF SPECIMEN:  PATHOLOGY ? ?COUNTS:  YES ? ?DICTATION: .Dragon Dictation ? ?PLAN OF CARE: Discharge to home after PACU ? ?PATIENT DISPOSITION:  PACU - hemodynamically stable. ? ?EBL minimal ? ?Findings:  Muscle appears grossly normal ? ?PROCEDURE:   ?Patient was identified in the holding area and taken the operating room where she was placed supine on the operating room table. General anesthesia was induced the LMA anesthesia. The right leg was prepped and draped in sterile fashion. Timeout was performed according to the surgical safety checklist. When all was correct, we continued. ? ? A vertical incision was made approximately 4 cm in length on the lateral thigh. Extensive local anesthetic was administered. The subcutaneous tissues were divided with the cautery. A Wietlaner retractor was used for visualization.  ? ?The fascia was opened sharply with the Metzenbaum scissors. A 3 cm piece of muscle was dissected out with a tonsil. This was clamped on both ends with hemostats. Metzenbaum scissors were used to resect a piece of muscle. This was tied down to the tongue depressor to keep it in stretch. The 2 ends of the muscle were tied off with 2-0 silk ties. The wound was irrigated.  ? ?The muscle fascia was closed with 2-0 running Vicryl suture. The skin was reapproximated with 3-0 Vicryl interrupted deep dermal suture and 4-0 Monocryl running subcuticular suture.  ? ?A segment of the gastrocnemius was taken similarly through a posterolateral incision on the right lower leg. ? ?The  incisions were then cleaned, dried, and dressed with Dermabond. Patient was awakened from anesthesia and taken to the PACU in stable condition. Needle, sponge, instrument counts were correct x2. ? ?

## 2021-09-14 NOTE — Interval H&P Note (Signed)
History and Physical Interval Note: ? ?09/14/2021 ?12:08 PM ? ?Martha Koch  has presented today for surgery, with the diagnosis of MUSCLE ATROPHY.  The various methods of treatment have been discussed with the patient and family. After consideration of risks, benefits and other options for treatment, the patient has consented to  Procedure(s): ?MUSCLE BIOPSY RIGHT THIGH AND RIGHT LOWER LEG (Right) as a surgical intervention.  The patient's history has been reviewed, patient examined, no change in status, stable for surgery.  I have reviewed the patient's chart and labs.  Questions were answered to the patient's satisfaction.   ? ? ?Stark Klein ? ? ?

## 2021-09-14 NOTE — Transfer of Care (Signed)
Immediate Anesthesia Transfer of Care Note ? ?Patient: Martha Koch ? ?Procedure(s) Performed: MUSCLE BIOPSY RIGHT THIGH AND RIGHT LOWER LEG (Right) ? ?Patient Location: PACU ? ?Anesthesia Type:General ? ?Level of Consciousness: drowsy ? ?Airway & Oxygen Therapy: Patient Spontanous Breathing ? ?Post-op Assessment: Report given to RN, Post -op Vital signs reviewed and stable and Patient moving all extremities ? ?Post vital signs: Reviewed and stable ? ?Last Vitals:  ?Vitals Value Taken Time  ?BP 143/78 09/14/21 1351  ?Temp    ?Pulse 86 09/14/21 1351  ?Resp 25 09/14/21 1351  ?SpO2 92 % 09/14/21 1351  ?Vitals shown include unvalidated device data. ? ?Last Pain:  ?Vitals:  ? 09/14/21 1118  ?TempSrc:   ?PainSc: 0-No pain  ?   ? ?  ? ?Complications: No notable events documented. ?

## 2021-09-15 ENCOUNTER — Encounter (HOSPITAL_COMMUNITY): Payer: Self-pay | Admitting: General Surgery

## 2021-09-18 NOTE — Anesthesia Postprocedure Evaluation (Signed)
Anesthesia Post Note ? ?Patient: Martha Koch ? ?Procedure(s) Performed: MUSCLE BIOPSY RIGHT THIGH AND RIGHT LOWER LEG (Right) ? ?  ? ?Patient location during evaluation: PACU ?Anesthesia Type: General ?Level of consciousness: awake and alert ?Pain management: pain level controlled ?Vital Signs Assessment: post-procedure vital signs reviewed and stable ?Respiratory status: spontaneous breathing, nonlabored ventilation and respiratory function stable ?Cardiovascular status: blood pressure returned to baseline and stable ?Postop Assessment: no apparent nausea or vomiting ?Anesthetic complications: no ? ? ?No notable events documented. ? ?Last Vitals:  ?Vitals:  ? 09/14/21 1405 09/14/21 1420  ?BP: (!) 148/79 136/90  ?Pulse: 72 71  ?Resp: 19 17  ?Temp:  36.4 ?C  ?SpO2: 95% 96%  ?  ?Last Pain:  ?Vitals:  ? 09/14/21 1420  ?TempSrc:   ?PainSc: 8   ? ? ?  ?  ?  ?  ?  ?  ? ?Merlinda Frederick ? ? ? ? ?

## 2021-10-03 ENCOUNTER — Encounter (HOSPITAL_COMMUNITY): Payer: Self-pay

## 2021-10-03 ENCOUNTER — Telehealth: Payer: Self-pay | Admitting: *Deleted

## 2021-10-03 LAB — SURGICAL PATHOLOGY

## 2021-10-03 NOTE — Telephone Encounter (Signed)
-----   Message from Melvenia Beam, MD sent at 10/03/2021  1:20 PM EDT ----- ?Please call patient and let me know: Biopsies showed normal muscle. I do not know why she is having leg atrophy. Will need to send her for another opinion to [facility] if she is amenable please ask and I can order and give the biopsy results to the referral team to send with the consult thanks ?

## 2021-10-03 NOTE — Telephone Encounter (Signed)
LVM for patient to call me back to discuss test results  ?

## 2021-10-04 ENCOUNTER — Telehealth: Payer: Self-pay | Admitting: *Deleted

## 2021-10-04 NOTE — Telephone Encounter (Signed)
-----   Message from Melvenia Beam, MD sent at 10/03/2021  1:20 PM EDT ----- ?Please call patient and let me know: Biopsies showed normal muscle. I do not know why she is having leg atrophy. Will need to send her for another opinion to [facility] if she is amenable please ask and I can order and give the biopsy results to the referral team to send with the consult thanks ?

## 2021-10-04 NOTE — Telephone Encounter (Signed)
Spoke with patient and advised, per Dr Jaynee Eagles, muscle biopsy showed normal muscle. Pt is amenable to being referred to [facility] for another opinion. Pt aware our office will send referral there. If she has not received a call to schedule within approx 2 weeks to call either our office or [facility] is she is given the number. Pt verbalized appreciation for the call and her questions were answered.  ?

## 2021-10-05 ENCOUNTER — Other Ambulatory Visit: Payer: Self-pay | Admitting: Neurology

## 2021-10-05 DIAGNOSIS — M62569 Muscle wasting and atrophy, not elsewhere classified, unspecified lower leg: Secondary | ICD-10-CM

## 2021-10-09 ENCOUNTER — Telehealth: Payer: Self-pay | Admitting: Neurology

## 2021-10-09 NOTE — Telephone Encounter (Signed)
Sent to [facility] ph # [phone] °

## 2021-11-02 ENCOUNTER — Other Ambulatory Visit: Payer: Self-pay

## 2021-11-03 ENCOUNTER — Other Ambulatory Visit: Payer: Self-pay

## 2021-11-06 ENCOUNTER — Other Ambulatory Visit: Payer: Self-pay | Admitting: Pharmacist

## 2021-11-06 NOTE — Patient Instructions (Signed)
Martha Koch,  ? ?Thank you for your time! ? ?Check your blood pressure twice weekly, and any time you have concerning symptoms like headache, chest pain, dizziness, shortness of breath, or vision changes.  ? ?Our goal is less than 130/80. ? ?To appropriately check your blood pressure, make sure you do the following:  ?1) Avoid caffeine, exercise, or tobacco products for 30 minutes before checking. Empty your bladder. ?2) Sit with your back supported in a flat-backed chair. Rest your arm on something flat (arm of the chair, table, etc). ?3) Sit still with your feet flat on the floor, resting, for at least 5 minutes.  ?4) Check your blood pressure. Take 1-2 readings.  ?5) Write down these readings and bring with you to any provider appointments. ? ?Bring your home blood pressure machine with you to a provider's office for accuracy comparison at least once a year.  ? ?Make sure you take your blood pressure medications before you come to any office visit, even if you were asked to fast for labs. ? ?Let us know if you have any questions between now and when you see Lurena Joiner at the clinic in June.  ? ?Thanks! ? ?Catie Hedwig Morton, PharmD, BCACP ?[place] ?[phone] ? ?

## 2021-11-06 NOTE — Chronic Care Management (AMB) (Signed)
Patient seen by Park Liter, PharmD Candidate on 11/03/21 while they were picking up prescriptions at [facility].  ? ?Patient does not have an automated home blood pressure machine, but purchased one while at the pharmacy.  ? ?Medication review was performed. They are not taking medications as prescribed- reports she missed ~ 5 doses of her medications in the past month ? ?The following barriers to adherence were noted: ?Denies concerns with medication access or understanding. ? ?The following interventions were completed:  ?Medications were reviewed ?Patient was educated on use of adherence strategies, like a pill box or alarms,  ?Patient was educated on proper technique to check home blood pressure and reminded to bring home machine and readings to next provider appointment ?Patient was educated on how to access home blood pressure machine ?Patient was provided counseling regarding tobacco cessation. Encouraged to consider. Counseled on appropriate dosing of patches.  ? ?The patient has follow up scheduled:  ?PCP: plans to schedule. Assisted patient in scheduling visit with embedded pharmacist on 12/12/21 ? ? ?Catie Hedwig Morton, PharmD, BCACP ?[place] ?[phone] ? ?

## 2021-12-12 ENCOUNTER — Encounter: Payer: Self-pay | Admitting: Pharmacist

## 2021-12-12 ENCOUNTER — Other Ambulatory Visit: Payer: Self-pay

## 2021-12-12 ENCOUNTER — Ambulatory Visit: Payer: Medicare Other | Attending: Family Medicine | Admitting: Pharmacist

## 2021-12-12 VITALS — BP 158/98 | HR 73

## 2021-12-12 DIAGNOSIS — J449 Chronic obstructive pulmonary disease, unspecified: Secondary | ICD-10-CM | POA: Insufficient documentation

## 2021-12-12 DIAGNOSIS — Z716 Tobacco abuse counseling: Secondary | ICD-10-CM | POA: Diagnosis not present

## 2021-12-12 DIAGNOSIS — E785 Hyperlipidemia, unspecified: Secondary | ICD-10-CM | POA: Insufficient documentation

## 2021-12-12 DIAGNOSIS — I10 Essential (primary) hypertension: Secondary | ICD-10-CM | POA: Insufficient documentation

## 2021-12-12 DIAGNOSIS — F17210 Nicotine dependence, cigarettes, uncomplicated: Secondary | ICD-10-CM | POA: Insufficient documentation

## 2021-12-12 DIAGNOSIS — Z79899 Other long term (current) drug therapy: Secondary | ICD-10-CM | POA: Diagnosis not present

## 2021-12-12 MED ORDER — NICOTINE POLACRILEX 4 MG MT LOZG
LOZENGE | OROMUCOSAL | 0 refills | Status: DC
  Filled 2021-12-12: qty 72, 4d supply, fill #0

## 2021-12-12 MED ORDER — NICOTINE 14 MG/24HR TD PT24
14.0000 mg | MEDICATED_PATCH | Freq: Every day | TRANSDERMAL | 1 refills | Status: DC
  Filled 2021-12-12: qty 28, 28d supply, fill #0

## 2021-12-12 NOTE — Progress Notes (Unsigned)
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 female who presents for hypertension evaluation, education, and management. PMH is significant for HTN, COPD, smoker (0.5 PPD), HLD, leg atrophy. Patient was referred by [facility] for hypertension management and seen last by pharmacist on 11/03/21 with BP 139/81.  Today, patient arrives in good spirits and presents without assistance. Recently picked up blood pressure cuff at pharmacy, but have not been able to use it yet. Patient has medication supply at home and has been taking daily. Denies dizziness, headache, blurred vision, swelling. Noted to have smoked cigarette and drank coffee right before appointment.  Of note, she is interested in smoking cessation. Currently smokes 0.5 PPD. Noted to not finish cigarettes sometimes throughout the day. Craves cigarette first thing in the morning and smokes while drinking coffee. She has previously tried gum and didn't like the taste. Denies oral smoking cessation, such as chantix.   Current antihypertensives include:  Amlodipine 10 mg daily  Reported home BP readings: none   O:  Last 3 Office BP readings: BP Readings from Last 3 Encounters:  11/06/21 139/81  09/14/21 136/90  09/05/21 (!) 151/83  Noted to have smoked cigarette and drank coffee right before appointment.  BMET    Component Value Date/Time   NA 140 09/05/2021 1530   NA 143 04/18/2021 1600   NA 145 09/23/2015 1136   K 3.6 09/05/2021 1530   K 4.7 09/23/2015 1136   CL 107 09/05/2021 1530   CO2 25 09/05/2021 1530   CO2 27 09/23/2015 1136   GLUCOSE 89 09/05/2021 1530   GLUCOSE 115 09/23/2015 1136   BUN 11 09/05/2021 1530   BUN 7 (L) 04/18/2021 1600   BUN 9.7 09/23/2015 1136   CREATININE 0.91 09/05/2021 1530   CREATININE 0.9 09/23/2015 1136   CALCIUM 9.3 09/05/2021 1530   CALCIUM 9.9 09/23/2015 1136   GFRNONAA >60 09/05/2021 1530   GFRAA 72 05/20/2020 0910    Renal function: CrCl cannot be calculated  (Patient's most recent lab result is older than the maximum 21 days allowed.).  Clinical ASCVD:  The 10-year ASCVD risk score (Arnett DK, et al., 2019) is: 20.5%   Values used to calculate the score:     Age: 62 years     Sex: Female     Is Non-Hispanic African American: Yes     Diabetic: No     Tobacco smoker: Yes     Systolic Blood Pressure: 062 mmHg     Is BP treated: Yes     HDL Cholesterol: 82 mg/dL     Total Cholesterol: 200 mg/dL  A/P: Hypertension diagnosed currently uncontrolled on current medications. BP goal < 130/80 mmHg. Medication adherence appears appropriate. Control is suboptimal due to lifestyle, including smoking and heavy caffeine intake. No medication changes made today as patient will focus on lifestyle changes. At next appointment, if BP still above goal consider additional agent.  -Start Nicotine '14mg'$  patches daily and nicotine '4mg'$  lozenges q 1-2 with cravings (Max 20/day).  -Continue current medications.  -Patient educated on purpose, proper use, and potential adverse effects of nicotine replacement.  -Counseled on lifestyle modifications for blood pressure control including reduced dietary sodium, increased exercise, adequate sleep, and decreased caffeine intake. -Encouraged patient to check BP at home and bring log of readings to next visit. Counseled on proper use of home BP cuff.   Results reviewed and written information provided. Patient verbalized understanding of treatment  plan. Total time in face-to-face counseling 35 minutes.   F/u clinic visit in 4 weeks.  Thank you for allowing pharmacy to participate in this patient's care.  Levonne Spiller, PharmD PGY1 Acute Care Resident  12/12/2021,6:01 PM

## 2021-12-13 ENCOUNTER — Encounter: Payer: Self-pay | Admitting: Pharmacist

## 2022-01-15 ENCOUNTER — Other Ambulatory Visit: Payer: Self-pay | Admitting: Family Medicine

## 2022-01-15 DIAGNOSIS — Z1231 Encounter for screening mammogram for malignant neoplasm of breast: Secondary | ICD-10-CM

## 2022-01-23 ENCOUNTER — Ambulatory Visit: Payer: Medicare Other | Admitting: Pharmacist

## 2022-01-29 ENCOUNTER — Ambulatory Visit
Admission: RE | Admit: 2022-01-29 | Discharge: 2022-01-29 | Disposition: A | Discharge status: Home / Self Care | Payer: Medicare Other | Source: Ambulatory Visit | Attending: Family Medicine | Admitting: Family Medicine

## 2022-01-29 DIAGNOSIS — Z1231 Encounter for screening mammogram for malignant neoplasm of breast: Secondary | ICD-10-CM

## 2022-02-14 ENCOUNTER — Other Ambulatory Visit: Payer: Self-pay | Admitting: Family Medicine

## 2022-02-14 ENCOUNTER — Other Ambulatory Visit: Payer: Self-pay | Admitting: Physician Assistant

## 2022-02-14 ENCOUNTER — Other Ambulatory Visit: Payer: Self-pay

## 2022-02-14 DIAGNOSIS — I10 Essential (primary) hypertension: Secondary | ICD-10-CM

## 2022-02-14 DIAGNOSIS — E785 Hyperlipidemia, unspecified: Secondary | ICD-10-CM

## 2022-02-14 MED ORDER — AMLODIPINE BESYLATE 10 MG PO TABS
10.0000 mg | ORAL_TABLET | Freq: Every day | ORAL | 0 refills | Status: DC
  Filled 2022-02-14: qty 90, 90d supply, fill #0

## 2022-02-15 ENCOUNTER — Other Ambulatory Visit: Payer: Self-pay

## 2022-02-15 ENCOUNTER — Telehealth: Payer: Self-pay

## 2022-02-15 NOTE — Telephone Encounter (Signed)
LVM for patient to return phone call and schedule Welcome to Medicare visit with PCP

## 2022-05-29 ENCOUNTER — Other Ambulatory Visit: Payer: Self-pay

## 2022-05-29 DIAGNOSIS — M62569 Muscle wasting and atrophy, not elsewhere classified, unspecified lower leg: Secondary | ICD-10-CM | POA: Diagnosis not present

## 2022-05-29 DIAGNOSIS — R7303 Prediabetes: Secondary | ICD-10-CM | POA: Diagnosis not present

## 2022-05-29 DIAGNOSIS — Z113 Encounter for screening for infections with a predominantly sexual mode of transmission: Secondary | ICD-10-CM | POA: Diagnosis not present

## 2022-05-29 DIAGNOSIS — R7989 Other specified abnormal findings of blood chemistry: Secondary | ICD-10-CM | POA: Diagnosis not present

## 2022-05-29 MED ORDER — GABAPENTIN 300 MG PO CAPS
ORAL_CAPSULE | ORAL | 5 refills | Status: DC
  Filled 2022-05-29: qty 30, 30d supply, fill #0
  Filled 2022-06-28: qty 30, 30d supply, fill #1

## 2022-06-18 ENCOUNTER — Other Ambulatory Visit: Payer: Self-pay

## 2022-06-28 ENCOUNTER — Other Ambulatory Visit: Payer: Self-pay

## 2022-07-03 ENCOUNTER — Other Ambulatory Visit: Payer: Self-pay

## 2022-07-03 ENCOUNTER — Other Ambulatory Visit: Payer: Self-pay | Admitting: Family Medicine

## 2022-07-03 DIAGNOSIS — E785 Hyperlipidemia, unspecified: Secondary | ICD-10-CM

## 2022-07-03 DIAGNOSIS — I10 Essential (primary) hypertension: Secondary | ICD-10-CM

## 2022-07-04 ENCOUNTER — Other Ambulatory Visit: Payer: Self-pay

## 2022-07-05 ENCOUNTER — Other Ambulatory Visit: Payer: Self-pay | Admitting: Family Medicine

## 2022-07-05 ENCOUNTER — Other Ambulatory Visit: Payer: Self-pay

## 2022-07-05 ENCOUNTER — Other Ambulatory Visit: Payer: Self-pay | Admitting: Pharmacist

## 2022-07-05 DIAGNOSIS — M62551 Muscle wasting and atrophy, not elsewhere classified, right thigh: Secondary | ICD-10-CM | POA: Diagnosis not present

## 2022-07-05 DIAGNOSIS — E785 Hyperlipidemia, unspecified: Secondary | ICD-10-CM

## 2022-07-05 DIAGNOSIS — R531 Weakness: Secondary | ICD-10-CM | POA: Diagnosis not present

## 2022-07-05 DIAGNOSIS — I10 Essential (primary) hypertension: Secondary | ICD-10-CM

## 2022-07-05 DIAGNOSIS — M62569 Muscle wasting and atrophy, not elsewhere classified, unspecified lower leg: Secondary | ICD-10-CM | POA: Diagnosis not present

## 2022-07-05 DIAGNOSIS — M79604 Pain in right leg: Secondary | ICD-10-CM | POA: Diagnosis not present

## 2022-07-05 MED ORDER — ATORVASTATIN CALCIUM 20 MG PO TABS
20.0000 mg | ORAL_TABLET | Freq: Every day | ORAL | 0 refills | Status: DC
  Filled 2022-07-05: qty 30, 30d supply, fill #0

## 2022-07-05 MED ORDER — AMLODIPINE BESYLATE 10 MG PO TABS
10.0000 mg | ORAL_TABLET | Freq: Every day | ORAL | 0 refills | Status: DC
  Filled 2022-07-05: qty 30, 30d supply, fill #0

## 2022-07-05 NOTE — Telephone Encounter (Signed)
Requested medication (s) are due for refill today: routing for approval  Requested medication (s) are on the active medication list: yes  Last refill:  04/18/21  Future visit scheduled: yes  Notes to clinic:  Unable to refill per protocol, courtesy refill already given, routing for provider approval.      Requested Prescriptions  Pending Prescriptions Disp Refills   atorvastatin (LIPITOR) 20 MG tablet 90 tablet 1    Sig: TAKE 1 TABLET (20 MG TOTAL) BY MOUTH DAILY.     Cardiovascular:  Antilipid - Statins Failed - 07/05/2022 10:45 AM      Failed - Lipid Panel in normal range within the last 12 months    Cholesterol, Total  Date Value Ref Range Status  04/18/2021 200 (H) 100 - 199 mg/dL Final   LDL Chol Calc (NIH)  Date Value Ref Range Status  04/18/2021 105 (H) 0 - 99 mg/dL Final   HDL  Date Value Ref Range Status  04/18/2021 82 >39 mg/dL Final   Triglycerides  Date Value Ref Range Status  04/18/2021 71 0 - 149 mg/dL Final         Passed - Patient is not pregnant      Passed - Valid encounter within last 12 months    Recent Outpatient Visits           6 months ago Essential hypertension   [place], RPH-CPP   1 year ago Essential hypertension   [place], [place]   2 years ago Other emphysema [facility])   [place], MD   2 years ago Encounter for smoking cessation counseling   [place], Jarome Matin, RPH-CPP   2 years ago Essential hypertension   [place], MD       Future Appointments             In 4 weeks [place], Dionne Bucy, PA-C [place]

## 2022-07-11 ENCOUNTER — Other Ambulatory Visit: Payer: Self-pay

## 2022-07-12 DIAGNOSIS — M5127 Other intervertebral disc displacement, lumbosacral region: Secondary | ICD-10-CM | POA: Diagnosis not present

## 2022-07-12 DIAGNOSIS — M5137 Other intervertebral disc degeneration, lumbosacral region: Secondary | ICD-10-CM | POA: Diagnosis not present

## 2022-08-02 ENCOUNTER — Ambulatory Visit: Payer: Medicare HMO | Attending: Physician Assistant | Admitting: Family Medicine

## 2022-08-02 ENCOUNTER — Other Ambulatory Visit: Payer: Self-pay

## 2022-08-02 ENCOUNTER — Encounter: Payer: Self-pay | Admitting: Family Medicine

## 2022-08-02 ENCOUNTER — Ambulatory Visit: Payer: Medicare Other | Admitting: Physician Assistant

## 2022-08-02 VITALS — BP 137/83 | HR 84 | Ht 67.0 in | Wt 147.0 lb

## 2022-08-02 DIAGNOSIS — R7303 Prediabetes: Secondary | ICD-10-CM

## 2022-08-02 DIAGNOSIS — E785 Hyperlipidemia, unspecified: Secondary | ICD-10-CM | POA: Diagnosis not present

## 2022-08-02 DIAGNOSIS — E2839 Other primary ovarian failure: Secondary | ICD-10-CM

## 2022-08-02 DIAGNOSIS — J3489 Other specified disorders of nose and nasal sinuses: Secondary | ICD-10-CM

## 2022-08-02 DIAGNOSIS — I10 Essential (primary) hypertension: Secondary | ICD-10-CM

## 2022-08-02 DIAGNOSIS — F17210 Nicotine dependence, cigarettes, uncomplicated: Secondary | ICD-10-CM | POA: Diagnosis not present

## 2022-08-02 MED ORDER — FLUTICASONE PROPIONATE 50 MCG/ACT NA SUSP
2.0000 | Freq: Every day | NASAL | 6 refills | Status: DC
  Filled 2022-08-02: qty 16, 30d supply, fill #0

## 2022-08-02 MED ORDER — ATORVASTATIN CALCIUM 20 MG PO TABS
20.0000 mg | ORAL_TABLET | Freq: Every day | ORAL | 0 refills | Status: DC
  Filled 2022-08-02: qty 30, 30d supply, fill #0

## 2022-08-02 MED ORDER — AMLODIPINE BESYLATE 10 MG PO TABS
10.0000 mg | ORAL_TABLET | Freq: Every day | ORAL | 0 refills | Status: DC
  Filled 2022-08-02: qty 30, 30d supply, fill #0

## 2022-08-02 MED ORDER — CETIRIZINE HCL 10 MG PO TABS
10.0000 mg | ORAL_TABLET | Freq: Every day | ORAL | 1 refills | Status: AC
  Filled 2022-08-02: qty 100, 100d supply, fill #0

## 2022-08-02 NOTE — Progress Notes (Signed)
Nasal drainage

## 2022-08-02 NOTE — Progress Notes (Signed)
Subjective:  Patient ID: Martha Koch, female    DOB: 11-Jul-1955  Age: 67 y.o. MRN: [id number]  CC: Hypertension   HPI Martha Koch is a 67 y.o. year old female with a history of hypertension, prediabetes, Emphysema (stable without medications), previous history of cervical cancer (status post total abdominal hysterectomy and salpingo-oophorectomy) who presents today for chronic disease management.   Interval History:  She is under the care of Neurosurgery for RIGHT LOWER EXTREMITY weakness. She has undergone muscle biopsy, NCS, MRI and per patient work up has been unreveling. She also has lost some muscle mass in her RIGHT LOWER EXTREMITY. Neurosurgeon is Dr Sallee Provencal at [facility]. She quit taking Gabapentin as it was ineffective.  Her nose has been runny sometimes for a couple of months and she denies presence of sore throat, post nasal drip, no sinus pressure. She has not used any medications and just snifs when she has a rhinorrhea.  Denies presence of anosmia, weight loss, foreign body sensation in her nose.  She has been without her Statin but has been taking her antihypertensive. BP is around 129/80 at home. Smkes 6 Cigarettes / day and has smoked since she was 19 years and smoked a ppd prior. Past Medical History:  Diagnosis Date   Cervical cancer ([place]) cervical   Hyperlipidemia    Hypertension    Radiation 06/02/14-07/13/14   pelvis 45 gray, vaginal cuff/parametrial boost to 50.4 gray   Radiation 04/06/15-05/13/15   anterior abdominal wall 50.4 gray    Past Surgical History:  Procedure Laterality Date   ABDOMINAL HYSTERECTOMY     COLONOSCOPY  2011   MUSCLE BIOPSY Right 09/14/2021   Procedure: MUSCLE BIOPSY RIGHT THIGH AND RIGHT LOWER LEG;  Surgeon: Stark Klein, MD;  Location: [facility];  Service: General;  Laterality: Right;   robotic radical hysterectomy, bso, pelvic lymphadenectomy Bilateral 04/21/14   TUBAL LIGATION      Family History  Problem Relation Age of  Onset   Hypertension Father    Breast cancer Maternal Aunt        breast cancer, x 4 aunts    Breast cancer Daughter    Cancer Other    Diabetes Other    Colon cancer Neg Hx    Colon polyps Neg Hx    Esophageal cancer Neg Hx    Stomach cancer Neg Hx    Rectal cancer Neg Hx    Neuropathy Neg Hx     Social History   Socioeconomic History   Marital status: Single    Spouse name: Not on file   Number of children: 3   Years of education: 12   Highest education level: Not on file  Occupational History   Occupation: [employer]     Employer: [employer]  Tobacco Use   Smoking status: Every Day    Packs/day: 0.50    Years: 30.00    Total pack years: 15.00    Types: Cigarettes   Smokeless tobacco: Never   Tobacco comments:    smokes 2-3 a day  Vaping Use   Vaping Use: Never used  Substance and Sexual Activity   Alcohol use: No    Alcohol/week: 0.0 standard drinks of alcohol   Drug use: No   Sexual activity: Not Currently  Other Topics Concern   Not on file  Social History Narrative   Has 3 children. They live in [place].   Has 3 grandchildren.    Uvaldo Rising (oldest daughter)  Lives alone.    Social Determinants of Health   Financial Resource Strain: Not on file  Food Insecurity: Not on file  Transportation Needs: Not on file  Physical Activity: Not on file  Stress: Not on file  Social Connections: Not on file    Allergies  Allergen Reactions   Aspirin     Stomach cramps   Penicillins Hives    Outpatient Medications Prior to Visit  Medication Sig Dispense Refill   amLODipine (NORVASC) 10 MG tablet Take 1 tablet (10 mg total) by mouth daily. 30 tablet 0   nicotine (NICODERM CQ - DOSED IN MG/24 HOURS) 14 mg/24hr patch Place 1 patch (14 mg total) topically onto the skin once daily. (Take off the old patch before applying a new one). (Patient not taking: Reported on 08/02/2022) 28 patch 1   nicotine polacrilex (EQ NICOTINE POLACRILEX) 4 MG lozenge  Use 1 lozenge ('4mg'$  total) once every 1-2 hours as needed for cravings. (No more than 20 lozenges per day). (Patient not taking: Reported on 08/02/2022) 100 tablet 0   atorvastatin (LIPITOR) 20 MG tablet Take 1 tablet (20 mg total) by mouth daily. (Patient not taking: Reported on 08/02/2022) 30 tablet 0   gabapentin (NEURONTIN) 300 MG capsule TAKE 1 CAPSULE BY MOUTH AT BEDTIME (Patient not taking: Reported on 08/02/2022) 30 capsule 5   ibuprofen (ADVIL) 200 MG tablet Take 600 mg by mouth every 6 (six) hours as needed for moderate pain. (Patient not taking: Reported on 08/02/2022)     Multiple Vitamin (MULTIVITAMIN WITH MINERALS) TABS tablet Take 1 tablet by mouth daily. (Patient not taking: Reported on 08/02/2022)     oxyCODONE (OXY IR/ROXICODONE) 5 MG immediate release tablet Take 1 tablet (5 mg total) by mouth every 6 (six) hours as needed for severe pain. (Patient not taking: Reported on 08/02/2022) 5 tablet 0   Facility-Administered Medications Prior to Visit  Medication Dose Route Frequency Provider Last Rate Last Admin   0.9 %  sodium chloride infusion  500 mL Intravenous Once Nelida Meuse III, MD         ROS Review of Systems  Constitutional:  Negative for activity change and appetite change.  HENT:  Positive for postnasal drip and rhinorrhea. Negative for sinus pressure and sore throat.   Respiratory:  Negative for chest tightness, shortness of breath and wheezing.   Cardiovascular:  Negative for chest pain and palpitations.  Gastrointestinal:  Negative for abdominal distention, abdominal pain and constipation.  Genitourinary: Negative.   Musculoskeletal:        See HPI  Psychiatric/Behavioral:  Negative for behavioral problems and dysphoric mood.     Objective:  BP 137/83   Pulse 84   Ht '5\' 7"'$  (1.702 m)   Wt 147 lb (66.7 kg)   SpO2 99%   BMI 23.02 kg/m      08/02/2022   10:07 AM 08/02/2022    9:37 AM 12/12/2021    4:36 PM  BP/Weight  Systolic BP 893 810 175  Diastolic BP 83 84 98   Wt. (Lbs)  147   BMI  23.02 kg/m2       Physical Exam Constitutional:      Appearance: She is well-developed.  HENT:     Right Ear: Tympanic membrane normal.     Left Ear: Tympanic membrane normal.     Nose: Nose normal.     Mouth/Throat:     Comments: Slight oropharyngeal erythema Cardiovascular:     Rate and Rhythm: Normal  rate.     Heart sounds: Normal heart sounds. No murmur heard. Pulmonary:     Effort: Pulmonary effort is normal.     Breath sounds: Normal breath sounds. No wheezing or rales.  Chest:     Chest wall: No tenderness.  Abdominal:     General: Bowel sounds are normal. There is no distension.     Palpations: Abdomen is soft. There is no mass.     Tenderness: There is no abdominal tenderness.  Musculoskeletal:        General: Normal range of motion.     Right lower leg: No edema.     Left lower leg: No edema.  Neurological:     Mental Status: She is alert and oriented to person, place, and time.  Psychiatric:        Mood and Affect: Mood normal.        Latest Ref Rng & Units 09/05/2021    3:30 PM 04/18/2021    4:00 PM 05/20/2020    9:10 AM  CMP  Glucose 70 - 99 mg/dL 89  85  94   BUN 8 - 23 mg/dL '11  7  13   '$ Creatinine 0.44 - 1.00 mg/dL 0.91  0.83  0.96   Sodium 135 - 145 mmol/L 140  143  143   Potassium 3.5 - 5.1 mmol/L 3.6  4.8  4.5   Chloride 98 - 111 mmol/L 107  105  104   CO2 22 - 32 mmol/L 25   24   Calcium 8.9 - 10.3 mg/dL 9.3  9.5  9.5   Total Protein 6.0 - 8.5 g/dL  7.0    Total Bilirubin 0.0 - 1.2 mg/dL  0.2    Alkaline Phos 44 - 121 IU/L  72    AST 0 - 40 IU/L  15      Lipid Panel     Component Value Date/Time   CHOL 200 (H) 04/18/2021 1600   TRIG 71 04/18/2021 1600   HDL 82 04/18/2021 1600   CHOLHDL 2.4 04/18/2021 1600   CHOLHDL 2.9 08/26/2015 0945   VLDL 12 08/26/2015 0945   LDLCALC 105 (H) 04/18/2021 1600    CBC    Component Value Date/Time   WBC 4.5 09/05/2021 1530   RBC 4.76 09/05/2021 1530   HGB 12.9 09/05/2021  1530   HGB 13.1 04/18/2021 1600   HGB 12.8 11/18/2014 0902   HCT 39.9 09/05/2021 1530   HCT 40.2 04/18/2021 1600   HCT 38.9 11/18/2014 0902   PLT 248 09/05/2021 1530   PLT 246 04/18/2021 1600   MCV 83.8 09/05/2021 1530   MCV 82 04/18/2021 1600   MCV 82.4 11/18/2014 0902   MCH 27.1 09/05/2021 1530   MCHC 32.3 09/05/2021 1530   RDW 15.0 09/05/2021 1530   RDW 15.0 04/18/2021 1600   RDW 13.2 11/18/2014 0902   LYMPHSABS 2.2 04/18/2021 1600   LYMPHSABS 0.9 11/18/2014 0902   MONOABS 0.3 11/18/2014 0902   EOSABS 0.0 04/18/2021 1600   BASOSABS 0.0 04/18/2021 1600   BASOSABS 0.0 11/18/2014 0902    Lab Results  Component Value Date   HGBA1C 5.7 (A) 04/18/2021    Assessment & Plan:  1. Essential hypertension Controlled Continue current regimen Counseled on blood pressure goal of less than 130/80, low-sodium, DASH diet, medication compliance, 150 minutes of moderate intensity exercise per week. Discussed medication compliance, adverse effects. - amLODipine (NORVASC) 10 MG tablet; Take 1 tablet (10 mg total) by mouth daily.  Dispense: 30 tablet; Refill: 0 - LP+Non-HDL Cholesterol - CMP14+EGFR - CBC with Differential/Platelet  2. Dyslipidemia Controlled Low-cholesterol diet - atorvastatin (LIPITOR) 20 MG tablet; Take 1 tablet (20 mg total) by mouth daily.  Dispense: 30 tablet; Refill: 0  3. Prediabetes Will check A1c today Continue to work on lifestyle modifications to prevent progression to type 2 diabetes mellitus - Hemoglobin A1c  4. Rhinorrhea Likely sinus etiology Will place on antihistamine and nasal corticosteroid and if symptoms persist will refer to ENT. - cetirizine (ZYRTEC) 10 MG tablet; Take 1 tablet (10 mg total) by mouth daily.  Dispense: 100 tablet; Refill: 1 - fluticasone (FLONASE) 50 MCG/ACT nasal spray; Place 2 sprays into both nostrils daily.  Dispense: 16 g; Refill: 6  5. Estrogen deficiency - DG Bone Density; Future  6. Smoking greater than 20 pack  years She is not ready to quit smoking - CT CHEST LUNG CANCER SCREENING LOW DOSE WO CONTRAST; Future    Meds ordered this encounter  Medications   amLODipine (NORVASC) 10 MG tablet    Sig: Take 1 tablet (10 mg total) by mouth daily.    Dispense:  30 tablet    Refill:  0   atorvastatin (LIPITOR) 20 MG tablet    Sig: Take 1 tablet (20 mg total) by mouth daily.    Dispense:  30 tablet    Refill:  0   cetirizine (ZYRTEC) 10 MG tablet    Sig: Take 1 tablet (10 mg total) by mouth daily.    Dispense:  100 tablet    Refill:  1   fluticasone (FLONASE) 50 MCG/ACT nasal spray    Sig: Place 2 sprays into both nostrils daily.    Dispense:  16 g    Refill:  6    Follow-up: Return in about 6 months (around 01/31/2023) for Chronic medical conditions.       Charlott Rakes, MD, FAAFP. [facility] [place], [place]   08/02/2022, 12:22 PM

## 2022-08-02 NOTE — Patient Instructions (Signed)
Postnasal Drip Postnasal drip is the feeling of mucus going down the back of your throat. Mucus is a slimy substance that moistens and cleans your nose and throat, as well as the air pockets in face bones near your forehead and cheeks (sinuses). Small amounts of mucus pass from your nose and sinuses down the back of your throat all the time. This is normal. When you produce too much mucus or the mucus gets too thick, you can feel it. Some common causes of postnasal drip include: Having more mucus because of: A cold or the flu. Allergies. Cold air. Certain medicines. Gastroesophageal reflux. Having more mucus that is thicker because of: A sinus or nasal infection. Dry air. A food allergy. Follow these instructions at home: Relieving discomfort  Gargle with a mixture of salt and water 3-4 times a day or as needed. To make salt water, completely dissolve -1 tsp (3-6 g) of salt in 1 cup (237 mL) of warm water. If the air in your home is dry, use a humidifier to add moisture to the air. Use a saline spray or a container (neti pot) to flush out the nose (nasal irrigation). These methods can help clear away mucus and keep the nasal passages moist. General instructions Take over-the-counter and prescription medicines only as told by your health care provider. Follow instructions from your health care provider about eating or drinking restrictions. You may need to avoid caffeine. Avoid things that you know you are allergic to (allergens), like dust, mold, pollen, pets, or certain foods. Drink enough fluid to keep your urine pale yellow. Keep all follow-up visits. This is important. Contact a health care provider if: You have a fever. You have a sore throat or difficulty swallowing. You have a headache. You have sinus or ear pain. You have a cough that does not go away. The mucus from your nose becomes thick and is green or yellow in color. You have cold or flu symptoms that last more than 10  days. Summary Postnasal drip is the feeling of mucus going down the back of your throat. Use nasal irrigation or a nasal spray to help clear away mucus and keep the nasal passages moist. Avoid things that you know you are allergic to (allergens), like dust, mold, pollen, pets, or certain foods. This information is not intended to replace advice given to you by your health care provider. Make sure you discuss any questions you have with your health care provider. Document Revised: 05/18/2021 Document Reviewed: 05/18/2021 Elsevier Patient Education  2023 Elsevier Inc.  

## 2022-08-03 LAB — CBC WITH DIFFERENTIAL/PLATELET
Basophils Absolute: 0 10*3/uL (ref 0.0–0.2)
Basos: 0 %
EOS (ABSOLUTE): 0 10*3/uL (ref 0.0–0.4)
Eos: 1 %
Hematocrit: 42.9 % (ref 34.0–46.6)
Hemoglobin: 14.4 g/dL (ref 11.1–15.9)
Immature Grans (Abs): 0 10*3/uL (ref 0.0–0.1)
Immature Granulocytes: 0 %
Lymphocytes Absolute: 2.2 10*3/uL (ref 0.7–3.1)
Lymphs: 45 %
MCH: 26.5 pg — ABNORMAL LOW (ref 26.6–33.0)
MCHC: 33.6 g/dL (ref 31.5–35.7)
MCV: 79 fL (ref 79–97)
Monocytes Absolute: 0.3 10*3/uL (ref 0.1–0.9)
Monocytes: 7 %
Neutrophils Absolute: 2.2 10*3/uL (ref 1.4–7.0)
Neutrophils: 47 %
Platelets: 281 10*3/uL (ref 150–450)
RBC: 5.43 x10E6/uL — ABNORMAL HIGH (ref 3.77–5.28)
RDW: 13.3 % (ref 11.7–15.4)
WBC: 4.8 10*3/uL (ref 3.4–10.8)

## 2022-08-03 LAB — LP+NON-HDL CHOLESTEROL
Cholesterol, Total: 211 mg/dL — ABNORMAL HIGH (ref 100–199)
HDL: 75 mg/dL (ref >39)
LDL Chol Calc (NIH): 122 mg/dL — ABNORMAL HIGH (ref 0–99)
Total Non-HDL-Chol (LDL+VLDL): 136 mg/dL — ABNORMAL HIGH (ref 0–129)
Triglycerides: 81 mg/dL (ref 0–149)
VLDL Cholesterol Cal: 14 mg/dL (ref 5–40)

## 2022-08-03 LAB — CMP14+EGFR
ALT: 19 IU/L (ref 0–32)
AST: 21 IU/L (ref 0–40)
Albumin/Globulin Ratio: 1.9 (ref 1.2–2.2)
Albumin: 4.6 g/dL (ref 3.9–4.9)
Alkaline Phosphatase: 84 IU/L (ref 44–121)
BUN/Creatinine Ratio: 9 — ABNORMAL LOW (ref 12–28)
BUN: 8 mg/dL (ref 8–27)
Bilirubin Total: 0.2 mg/dL (ref 0.0–1.2)
CO2: 23 mmol/L (ref 20–29)
Calcium: 10 mg/dL (ref 8.7–10.3)
Chloride: 104 mmol/L (ref 96–106)
Creatinine, Ser: 0.94 mg/dL (ref 0.57–1.00)
Globulin, Total: 2.4 g/dL (ref 1.5–4.5)
Glucose: 98 mg/dL (ref 70–99)
Potassium: 4.7 mmol/L (ref 3.5–5.2)
Sodium: 142 mmol/L (ref 134–144)
Total Protein: 7 g/dL (ref 6.0–8.5)
eGFR: 67 mL/min/{1.73_m2} (ref >59)

## 2022-08-03 LAB — HEMOGLOBIN A1C
Est. average glucose Bld gHb Est-mCnc: 128 mg/dL
Hgb A1c MFr Bld: 6.1 % — ABNORMAL HIGH (ref 4.8–5.6)

## 2022-08-08 ENCOUNTER — Ambulatory Visit: Payer: Medicare HMO | Attending: Family Medicine

## 2022-08-08 DIAGNOSIS — Z23 Encounter for immunization: Secondary | ICD-10-CM

## 2022-08-08 NOTE — Progress Notes (Signed)
FLU vaccine right deltoid  and  pneumonia administered in right deltoid per protocols.  Information sheet given. Patient denies and pain or discomfort at injection site. Tolerated injection well no reaction.

## 2022-08-21 ENCOUNTER — Other Ambulatory Visit: Payer: Self-pay

## 2022-08-21 DIAGNOSIS — M5416 Radiculopathy, lumbar region: Secondary | ICD-10-CM | POA: Diagnosis not present

## 2022-08-21 DIAGNOSIS — M79661 Pain in right lower leg: Secondary | ICD-10-CM | POA: Diagnosis not present

## 2022-08-21 DIAGNOSIS — M62561 Muscle wasting and atrophy, not elsewhere classified, right lower leg: Secondary | ICD-10-CM | POA: Diagnosis not present

## 2022-08-21 DIAGNOSIS — R531 Weakness: Secondary | ICD-10-CM | POA: Diagnosis not present

## 2022-08-21 DIAGNOSIS — M47817 Spondylosis without myelopathy or radiculopathy, lumbosacral region: Secondary | ICD-10-CM | POA: Diagnosis not present

## 2022-08-21 MED ORDER — NAPROXEN 500 MG PO TABS
ORAL_TABLET | ORAL | 5 refills | Status: DC
  Filled 2022-08-21: qty 60, 30d supply, fill #0
  Filled 2022-11-28: qty 60, 60d supply, fill #1

## 2022-08-23 ENCOUNTER — Other Ambulatory Visit: Payer: Self-pay

## 2022-08-29 ENCOUNTER — Other Ambulatory Visit: Payer: Self-pay | Admitting: *Deleted

## 2022-08-29 NOTE — Progress Notes (Signed)
error 

## 2022-08-30 ENCOUNTER — Ambulatory Visit
Admission: RE | Admit: 2022-08-30 | Discharge: 2022-08-30 | Disposition: A | Discharge status: Home / Self Care | Payer: Medicare HMO | Source: Ambulatory Visit | Attending: Family Medicine | Admitting: Family Medicine

## 2022-08-30 DIAGNOSIS — I700 Atherosclerosis of aorta: Secondary | ICD-10-CM | POA: Diagnosis not present

## 2022-08-30 DIAGNOSIS — J432 Centrilobular emphysema: Secondary | ICD-10-CM | POA: Diagnosis not present

## 2022-08-30 DIAGNOSIS — Z8541 Personal history of malignant neoplasm of cervix uteri: Secondary | ICD-10-CM | POA: Diagnosis not present

## 2022-08-30 DIAGNOSIS — F17210 Nicotine dependence, cigarettes, uncomplicated: Secondary | ICD-10-CM | POA: Diagnosis not present

## 2022-09-07 DIAGNOSIS — M792 Neuralgia and neuritis, unspecified: Secondary | ICD-10-CM | POA: Diagnosis not present

## 2022-09-07 DIAGNOSIS — M7061 Trochanteric bursitis, right hip: Secondary | ICD-10-CM | POA: Diagnosis not present

## 2022-09-07 DIAGNOSIS — M533 Sacrococcygeal disorders, not elsewhere classified: Secondary | ICD-10-CM | POA: Diagnosis not present

## 2022-09-07 DIAGNOSIS — M7918 Myalgia, other site: Secondary | ICD-10-CM | POA: Diagnosis not present

## 2022-09-11 ENCOUNTER — Other Ambulatory Visit: Payer: Self-pay

## 2022-09-12 ENCOUNTER — Telehealth: Payer: Self-pay | Admitting: Family Medicine

## 2022-09-12 ENCOUNTER — Other Ambulatory Visit: Payer: Self-pay

## 2022-09-12 ENCOUNTER — Other Ambulatory Visit: Payer: Self-pay | Admitting: Family Medicine

## 2022-09-12 DIAGNOSIS — I10 Essential (primary) hypertension: Secondary | ICD-10-CM

## 2022-09-12 MED ORDER — AMLODIPINE BESYLATE 10 MG PO TABS
10.0000 mg | ORAL_TABLET | Freq: Every day | ORAL | 0 refills | Status: DC
  Filled 2022-09-12: qty 30, 30d supply, fill #0

## 2022-09-12 NOTE — Telephone Encounter (Signed)
Attempted to contact patient and Vm was left informing her to return phone call for results. Amlodipine has been refilled.

## 2022-09-12 NOTE — Telephone Encounter (Signed)
Patient came in requesting refill on amLODipine. Also wanted to speak with you regarding her CT SCAN results.

## 2022-09-13 ENCOUNTER — Telehealth: Payer: Self-pay | Admitting: *Deleted

## 2022-09-13 ENCOUNTER — Encounter: Payer: Self-pay | Admitting: Family Medicine

## 2022-09-13 NOTE — Telephone Encounter (Signed)
Noted  

## 2022-09-13 NOTE — Telephone Encounter (Signed)
Pt given   per notes of Dr. Margarita Rana from 09/03/22  on 09/13/22. Pt verbalized understanding and aware to work on low cholesterol diet and exercise.

## 2022-09-14 ENCOUNTER — Ambulatory Visit: Payer: Self-pay

## 2022-09-14 ENCOUNTER — Encounter (HOSPITAL_COMMUNITY): Payer: Self-pay | Admitting: Emergency Medicine

## 2022-09-14 ENCOUNTER — Emergency Department (HOSPITAL_COMMUNITY): Payer: Medicare HMO

## 2022-09-14 ENCOUNTER — Other Ambulatory Visit: Payer: Self-pay

## 2022-09-14 ENCOUNTER — Emergency Department (HOSPITAL_COMMUNITY)
Admission: EM | Admit: 2022-09-14 | Discharge: 2022-09-14 | Disposition: A | Discharge status: Home / Self Care | Payer: Medicare HMO | Attending: Emergency Medicine | Admitting: Emergency Medicine

## 2022-09-14 DIAGNOSIS — R059 Cough, unspecified: Secondary | ICD-10-CM | POA: Diagnosis present

## 2022-09-14 DIAGNOSIS — R0602 Shortness of breath: Secondary | ICD-10-CM | POA: Diagnosis not present

## 2022-09-14 DIAGNOSIS — Z79899 Other long term (current) drug therapy: Secondary | ICD-10-CM | POA: Insufficient documentation

## 2022-09-14 DIAGNOSIS — Z1152 Encounter for screening for COVID-19: Secondary | ICD-10-CM | POA: Insufficient documentation

## 2022-09-14 DIAGNOSIS — J168 Pneumonia due to other specified infectious organisms: Secondary | ICD-10-CM | POA: Diagnosis not present

## 2022-09-14 DIAGNOSIS — Z87891 Personal history of nicotine dependence: Secondary | ICD-10-CM | POA: Insufficient documentation

## 2022-09-14 DIAGNOSIS — Z8541 Personal history of malignant neoplasm of cervix uteri: Secondary | ICD-10-CM | POA: Insufficient documentation

## 2022-09-14 DIAGNOSIS — R000 Tachycardia, unspecified: Secondary | ICD-10-CM | POA: Diagnosis not present

## 2022-09-14 DIAGNOSIS — J189 Pneumonia, unspecified organism: Secondary | ICD-10-CM | POA: Insufficient documentation

## 2022-09-14 DIAGNOSIS — I10 Essential (primary) hypertension: Secondary | ICD-10-CM | POA: Diagnosis not present

## 2022-09-14 DIAGNOSIS — R531 Weakness: Secondary | ICD-10-CM | POA: Diagnosis not present

## 2022-09-14 LAB — RESP PANEL BY RT-PCR (RSV, FLU A&B, COVID)  RVPGX2
Influenza A by PCR: NEGATIVE
Influenza B by PCR: NEGATIVE
Resp Syncytial Virus by PCR: NEGATIVE
SARS Coronavirus 2 by RT PCR: NEGATIVE

## 2022-09-14 LAB — COMPREHENSIVE METABOLIC PANEL
ALT: 17 U/L (ref 0–44)
AST: 25 U/L (ref 15–41)
Albumin: 4.2 g/dL (ref 3.5–5.0)
Alkaline Phosphatase: 73 U/L (ref 38–126)
Anion gap: 9 (ref 5–15)
BUN: 15 mg/dL (ref 8–23)
CO2: 24 mmol/L (ref 22–32)
Calcium: 8.9 mg/dL (ref 8.9–10.3)
Chloride: 103 mmol/L (ref 98–111)
Creatinine, Ser: 0.91 mg/dL (ref 0.44–1.00)
GFR, Estimated: >60 mL/min (ref >60)
Glucose, Bld: 113 mg/dL — ABNORMAL HIGH (ref 70–99)
Potassium: 3.6 mmol/L (ref 3.5–5.1)
Sodium: 136 mmol/L (ref 135–145)
Total Bilirubin: 0.8 mg/dL (ref 0.3–1.2)
Total Protein: 7.8 g/dL (ref 6.5–8.1)

## 2022-09-14 LAB — URINALYSIS, ROUTINE W REFLEX MICROSCOPIC
Bacteria, UA: NONE SEEN
Bilirubin Urine: NEGATIVE
Glucose, UA: NEGATIVE mg/dL
Ketones, ur: NEGATIVE mg/dL
Leukocytes,Ua: NEGATIVE
Nitrite: NEGATIVE
Protein, ur: 30 mg/dL — AB
Specific Gravity, Urine: 1.019 (ref 1.005–1.030)
pH: 5 (ref 5.0–8.0)

## 2022-09-14 LAB — CBC WITH DIFFERENTIAL/PLATELET
Abs Immature Granulocytes: 0.01 10*3/uL (ref 0.00–0.07)
Basophils Absolute: 0 10*3/uL (ref 0.0–0.1)
Basophils Relative: 0 %
Eosinophils Absolute: 0 10*3/uL (ref 0.0–0.5)
Eosinophils Relative: 0 %
HCT: 45.9 % (ref 36.0–46.0)
Hemoglobin: 14.7 g/dL (ref 12.0–15.0)
Immature Granulocytes: 0 %
Lymphocytes Relative: 20 %
Lymphs Abs: 1.1 10*3/uL (ref 0.7–4.0)
MCH: 26.7 pg (ref 26.0–34.0)
MCHC: 32 g/dL (ref 30.0–36.0)
MCV: 83.3 fL (ref 80.0–100.0)
Monocytes Absolute: 0.3 10*3/uL (ref 0.1–1.0)
Monocytes Relative: 5 %
Neutro Abs: 3.9 10*3/uL (ref 1.7–7.7)
Neutrophils Relative %: 75 %
Platelets: 205 10*3/uL (ref 150–400)
RBC: 5.51 MIL/uL — ABNORMAL HIGH (ref 3.87–5.11)
RDW: 14.7 % (ref 11.5–15.5)
WBC: 5.3 10*3/uL (ref 4.0–10.5)
nRBC: 0 % (ref 0.0–0.2)

## 2022-09-14 LAB — TROPONIN I (HIGH SENSITIVITY): Troponin I (High Sensitivity): 7 ng/L (ref <18)

## 2022-09-14 MED ORDER — LEVOFLOXACIN 500 MG PO TABS: 500.0000 mg | ORAL_TABLET | Freq: Every day | ORAL | 0 refills | Status: DC

## 2022-09-14 MED ORDER — LEVOFLOXACIN 750 MG PO TABS
750.0000 mg | ORAL_TABLET | Freq: Once | ORAL | Status: AC
  Administered 2022-09-14: 750 mg via ORAL
  Filled 2022-09-14: qty 1

## 2022-09-14 NOTE — ED Triage Notes (Signed)
Patient arrives ambulatory by POV reports quitting coffee and smoking on Wednesday. C/o light headed and weakness in legs. States her BP dropped to 80s.

## 2022-09-14 NOTE — Discharge Instructions (Addendum)
You are seen today in the emergency department due to weakness and cough.  You have a right-sided pneumonia, this is an infection that we will do better with antibiotics.  Take the Levaquin twice daily for 7 days, the first dose was given to you here in the emergency department.  Take the second 1 in the morning tomorrow.  Call your primary and follow-up on Monday or Tuesday for reevaluation, return to the ED if you have worsening shortness of breath, severe fevers, severe chest pain or new or concerning symptoms.

## 2022-09-14 NOTE — ED Provider Notes (Signed)
[place] EMERGENCY DEPARTMENT AT [facility] Provider Note   CSN: [id number] Arrival date & time: 09/14/22  1557     History  Chief Complaint  Patient presents with   Weakness    Martha Koch is a 67 y.o. female.   Weakness    Patient with medical history of pulmonary nodules, emphysema, hypertension, lipidemia, cervical cancer status postradiation and chemo presents to the emergency department due to cough.  Patient dates she days ago she quit smoking cigarettes and caffeine cold [place].  She has been having a worsening productive cough over the last few days and feeling short of breath.  Generally feels fatigued, no chest pain or lower extremity swelling.  Not been on any recent antibiotics.  Home Medications Prior to Admission medications   Medication Sig Start Date End Date Taking? Authorizing Provider  levofloxacin (LEVAQUIN) 500 MG tablet Take 1 tablet (500 mg total) by mouth daily. 09/14/22  Yes Sherrill Raring, PA-C  amLODipine (NORVASC) 10 MG tablet Take 1 tablet (10 mg total) by mouth daily. 09/12/22   Charlott Rakes, MD  atorvastatin (LIPITOR) 20 MG tablet Take 1 tablet (20 mg total) by mouth daily. 08/02/22   Charlott Rakes, MD  cetirizine (ZYRTEC) 10 MG tablet Take 1 tablet (10 mg total) by mouth daily. 08/02/22   Charlott Rakes, MD  fluticasone (FLONASE) 50 MCG/ACT nasal spray Place 2 sprays into both nostrils daily. 08/02/22   Charlott Rakes, MD  naproxen (NAPROSYN) 500 MG tablet Take 1 tablet (500 mg total) by mouth 2 times daily with meals for 30 days then reduce to 1 tablet (500 mg total) by mouth nightly. 08/21/22     nicotine (NICODERM CQ - DOSED IN MG/24 HOURS) 14 mg/24hr patch Place 1 patch (14 mg total) topically onto the skin once daily. (Take off the old patch before applying a new one). Patient not taking: Reported on 08/02/2022 12/12/21   Charlott Rakes, MD  nicotine polacrilex (EQ NICOTINE POLACRILEX) 4 MG lozenge Use 1 lozenge (4mg  total) once  every 1-2 hours as needed for cravings. (No more than 20 lozenges per day). Patient not taking: Reported on 08/02/2022 12/12/21   Charlott Rakes, MD      Allergies    Aspirin and Penicillins    Review of Systems   Review of Systems  Neurological:  Positive for weakness.    Physical Exam Updated Vital Signs BP 126/84   Pulse (!) 113   Temp 98.4 F (36.9 C) (Oral)   Resp 18   Ht 5\' 7"  (1.702 m)   Wt 66.7 kg   SpO2 95%   BMI 23.02 kg/m  Physical Exam Vitals and nursing note reviewed. Exam conducted with a chaperone present.  Constitutional:      Appearance: Normal appearance.  HENT:     Head: Normocephalic and atraumatic.  Eyes:     General: No scleral icterus.       Right eye: No discharge.        Left eye: No discharge.     Extraocular Movements: Extraocular movements intact.     Pupils: Pupils are equal, round, and reactive to light.  Cardiovascular:     Rate and Rhythm: Regular rhythm. Tachycardia present.     Pulses: Normal pulses.     Heart sounds: Normal heart sounds.     No friction rub. No gallop.     Comments: Mildly tachycardic with regular rate Pulmonary:     Effort: Pulmonary effort is normal. No respiratory distress.  Breath sounds: Normal breath sounds.  Abdominal:     General: Abdomen is flat. Bowel sounds are normal. There is no distension.     Palpations: Abdomen is soft.     Tenderness: There is no abdominal tenderness.  Skin:    General: Skin is warm and dry.     Coloration: Skin is not jaundiced.  Neurological:     Mental Status: She is alert. Mental status is at baseline.     Coordination: Coordination normal.     ED Results / Procedures / Treatments   Labs (all labs ordered are listed, but only abnormal results are displayed) Labs Reviewed  CBC WITH DIFFERENTIAL/PLATELET - Abnormal; Notable for the following components:      Result Value   RBC 5.51 (*)    All other components within normal limits  COMPREHENSIVE METABOLIC PANEL -  Abnormal; Notable for the following components:   Glucose, Bld 113 (*)    All other components within normal limits  URINALYSIS, ROUTINE W REFLEX MICROSCOPIC - Abnormal; Notable for the following components:   APPearance HAZY (*)    Hgb urine dipstick SMALL (*)    Protein, ur 30 (*)    All other components within normal limits  RESP PANEL BY RT-PCR (RSV, FLU A&B, COVID)  RVPGX2  TROPONIN I (HIGH SENSITIVITY)  TROPONIN I (HIGH SENSITIVITY)    EKG EKG Interpretation  Date/Time:  Friday September 14 2022 16:18:07 EDT Ventricular Rate:  119 PR Interval:  160 QRS Duration: 85 QT Interval:  318 QTC Calculation: 448 R Axis:   93 Text Interpretation: Sinus tachycardia Biatrial enlargement Right axis deviation No previous ECGs available Confirmed by Wandra Arthurs [phone]) on 09/14/2022 8:31:48 PM  Radiology DG Chest 2 View  Result Date: 09/14/2022 CLINICAL DATA:  Shortness of breath. EXAM: CHEST - 2 VIEW COMPARISON:  None Available. FINDINGS: Lungs are hyperexpanded. Patchy airspace disease seen in the parahilar right lung and right lung base. Interstitial markings are diffusely coarsened with chronic features. The cardiopericardial silhouette is within normal limits for size. The visualized bony structures of the thorax are unremarkable. Telemetry leads overlie the chest. IMPRESSION: Subtle patchy airspace opacity in the parahilar right lung and right lung base. Imaging features suspicious for pneumonia. Electronically Signed   By: Misty Stanley M.D.   On: 09/14/2022 16:47    Procedures Procedures    Medications Ordered in ED Medications  levofloxacin (LEVAQUIN) tablet 750 mg (has no administration in time range)    ED Course/ Medical Decision Making/ A&P                             Medical Decision Making Amount and/or Complexity of Data Reviewed Labs: ordered. Radiology: ordered.  Risk Prescription drug management.   This is a 67 year old female presenting to the emergency  department due to shortness of breath.  Differential includes but not limited to adverse reaction to caffeine and tobacco abstinence, COPD exacerbation, CHF, pneumonia, PE, ACS, arrhythmia, electrolyte derangement.  On exam patient is mildly tachycardia but lung sounds are clear. Not hypoxic, no sirs criteria and not septic. Will check labs, CXR, viral panel and reassess.  I ordered and interpreted labs:  CBC without leukocytosis or anemia.   CMP without gross electro derangement or AKI.   UA is unremarkable viral panel is negative.   Trop low 7  Chest x-ray is notable for right-sided pneumonia.  EKG shows sinus tachycardia without any specific ischemic  changes.  Patient has been mildly tachycardic but blood pressure has been stable and not hypotensive.  Presentation does not seem consistent with ACS, given no chest pain, no ischemic change on EKG and negative troponin do not think it is CAD.  Considered PE but no pleuritic chest pain, also evidence of pneumonia on chest x-ray which is more likely.  Patient's curb 65 score is 1 (1 point for age), low risk for outpatient trial of antibiotics.    Engaged in shared decision-making with the patient and her daughter we will proceed with outpatient trial.  Hives do Augmentin, will proceed with Levaquin.  Strict return precaution discussed with the patient who verbalized understanding.        Final Clinical Impression(s) / ED Diagnoses Final diagnoses:  Community acquired pneumonia of right lung, unspecified part of lung    Rx / DC Orders ED Discharge Orders          Ordered    levofloxacin (LEVAQUIN) 500 MG tablet  Daily        09/14/22 2046              Sherrill Raring, PA-C 09/14/22 2053    Drenda Freeze, MD 09/14/22 [phone]

## 2022-09-14 NOTE — Telephone Encounter (Signed)
Patient is requesting inhaler. Ok to write?

## 2022-09-14 NOTE — ED Provider Triage Note (Signed)
Emergency Medicine Provider Triage Evaluation Note  Martha Koch , a 67 y.o. female  was evaluated in triage.  Pt complains of weakness/fatigue for the last 2 days.  Patient quit caffeine and cigarettes 2 days ago cold [place].  She has been having generalized fatigue, shortness of breath, cough since then.  No specific chest pain or lower extremity swelling  Review of Systems  Per HPI  Physical Exam  BP 124/75   Pulse (!) 121   Temp 98.9 F (37.2 C) (Oral)   Resp 18   Ht 5\' 7"  (1.702 m)   Wt 66.7 kg   SpO2 100%   BMI 23.02 kg/m  Gen:   Awake, no distress   Resp:  Normal effort  MSK:   Moves extremities without difficulty  Other:  Tachycardic   Medical Decision Making  Medically screening exam initiated at 4:22 PM.  Appropriate orders placed.  Martha Koch was informed that the remainder of the evaluation will be completed by another provider, this initial triage assessment does not replace that evaluation, and the importance of remaining in the ED until their evaluation is complete.     Sherrill Raring, PA-C 09/14/22 1624

## 2022-09-14 NOTE — Telephone Encounter (Signed)
Patient called, left VM to return the call to the office to discuss results and if pt having any sx with a nurse.  Summary: inhaler for emphysema   [place] Phone: [phone] Fax: [phone]   Is the pharmacy she would like for the medication to me called into.  ----- Message from [place] sent at 09/14/2022 10:54 AM EDT ----- Pt states that she received her CT scan results and was told she has a slight touch of emphysema and that an inhaler could be called in for her. Pt is wanting to know if the inhaler could be called in. Please advise.  [place] Phone: [phone] Fax: [phone]

## 2022-09-14 NOTE — Telephone Encounter (Signed)
  Chief Complaint: Low BP - legs feel heavy. Symptoms: Above Frequency:  Today Pertinent Negatives: Patient denies  Disposition: [x] ED /[] Urgent Care (no appt availability in office) / [] Appointment(In office/virtual)/ []  [facility]/ [] Home Care/ [] Refused Recommended Disposition /[] [facility]/ []  Follow-up with PCP Additional Notes: PT stopped smoking this week, she is wearing a nicotine patch and also gave up coffee. Pt is report that her legs feel heavy. Pt will go to ED

## 2022-09-14 NOTE — Telephone Encounter (Signed)
Attempted to call patient-no answer- left message to call office.

## 2022-09-14 NOTE — Telephone Encounter (Signed)
FYI

## 2022-09-14 NOTE — Telephone Encounter (Signed)
Left message to call back about symptoms. °

## 2022-09-14 NOTE — Telephone Encounter (Signed)
Reason for Disposition . [id number] Fall in systolic BP > 20 mm Hg from normal AND [2] dizzy, lightheaded, or weak  Answer Assessment - Initial Assessment Questions 1. BLOOD PRESSURE: "What is the blood pressure?" "Did you take at least two measurements 5 minutes apart?"     85/65  and 80/65 - pulse 134, yesterday 113/85 2. ONSET: "When did you take your blood pressure?"     A few minutes ago 3. HOW: "How did you obtain the blood pressure?" (e.g., visiting nurse, automatic home BP monitor)     Home monitor 4. HISTORY: "Do you have a history of low blood pressure?" "What is your blood pressure normally?"     no 5. MEDICINES: "Are you taking any medications for blood pressure?" If Yes, ask: "Have they been changed recently?"     Amlodipine 6. PULSE RATE: "Do you know what your pulse rate is?"      134 7. OTHER SYMPTOMS: "Have you been sick recently?" "Have you had a recent injury?"     no  Protocols used: Blood Pressure - Low-A-AH

## 2022-09-16 ENCOUNTER — Encounter (HOSPITAL_COMMUNITY): Payer: Self-pay

## 2022-09-16 ENCOUNTER — Other Ambulatory Visit: Payer: Self-pay

## 2022-09-16 ENCOUNTER — Observation Stay (HOSPITAL_COMMUNITY)
Admission: EM | Admit: 2022-09-16 | Discharge: 2022-09-17 | Disposition: A | Discharge status: Home / Self Care | Payer: Medicare HMO | Attending: Internal Medicine | Admitting: Internal Medicine

## 2022-09-16 ENCOUNTER — Emergency Department (HOSPITAL_COMMUNITY): Payer: Medicare HMO

## 2022-09-16 DIAGNOSIS — R0902 Hypoxemia: Secondary | ICD-10-CM | POA: Diagnosis not present

## 2022-09-16 DIAGNOSIS — R0602 Shortness of breath: Secondary | ICD-10-CM | POA: Diagnosis not present

## 2022-09-16 DIAGNOSIS — R062 Wheezing: Secondary | ICD-10-CM | POA: Diagnosis not present

## 2022-09-16 DIAGNOSIS — Z79899 Other long term (current) drug therapy: Secondary | ICD-10-CM | POA: Insufficient documentation

## 2022-09-16 DIAGNOSIS — J441 Chronic obstructive pulmonary disease with (acute) exacerbation: Secondary | ICD-10-CM | POA: Insufficient documentation

## 2022-09-16 DIAGNOSIS — R059 Cough, unspecified: Secondary | ICD-10-CM | POA: Diagnosis not present

## 2022-09-16 DIAGNOSIS — J9601 Acute respiratory failure with hypoxia: Secondary | ICD-10-CM | POA: Diagnosis not present

## 2022-09-16 DIAGNOSIS — B348 Other viral infections of unspecified site: Secondary | ICD-10-CM | POA: Insufficient documentation

## 2022-09-16 DIAGNOSIS — Z8541 Personal history of malignant neoplasm of cervix uteri: Secondary | ICD-10-CM | POA: Insufficient documentation

## 2022-09-16 DIAGNOSIS — I10 Essential (primary) hypertension: Secondary | ICD-10-CM | POA: Insufficient documentation

## 2022-09-16 DIAGNOSIS — R918 Other nonspecific abnormal finding of lung field: Secondary | ICD-10-CM | POA: Diagnosis present

## 2022-09-16 DIAGNOSIS — J449 Chronic obstructive pulmonary disease, unspecified: Secondary | ICD-10-CM | POA: Diagnosis present

## 2022-09-16 DIAGNOSIS — R531 Weakness: Secondary | ICD-10-CM | POA: Diagnosis not present

## 2022-09-16 DIAGNOSIS — I959 Hypotension, unspecified: Secondary | ICD-10-CM | POA: Diagnosis not present

## 2022-09-16 DIAGNOSIS — Z87891 Personal history of nicotine dependence: Secondary | ICD-10-CM | POA: Insufficient documentation

## 2022-09-16 DIAGNOSIS — J189 Pneumonia, unspecified organism: Secondary | ICD-10-CM | POA: Diagnosis not present

## 2022-09-16 DIAGNOSIS — J439 Emphysema, unspecified: Secondary | ICD-10-CM | POA: Diagnosis not present

## 2022-09-16 LAB — COMPREHENSIVE METABOLIC PANEL
ALT: 19 U/L (ref 0–44)
AST: 34 U/L (ref 15–41)
Albumin: 3.5 g/dL (ref 3.5–5.0)
Alkaline Phosphatase: 59 U/L (ref 38–126)
Anion gap: 9 (ref 5–15)
BUN: 13 mg/dL (ref 8–23)
CO2: 22 mmol/L (ref 22–32)
Calcium: 8.3 mg/dL — ABNORMAL LOW (ref 8.9–10.3)
Chloride: 105 mmol/L (ref 98–111)
Creatinine, Ser: 0.89 mg/dL (ref 0.44–1.00)
GFR, Estimated: >60 mL/min (ref >60)
Glucose, Bld: 131 mg/dL — ABNORMAL HIGH (ref 70–99)
Potassium: 3.5 mmol/L (ref 3.5–5.1)
Sodium: 136 mmol/L (ref 135–145)
Total Bilirubin: 0.4 mg/dL (ref 0.3–1.2)
Total Protein: 6.9 g/dL (ref 6.5–8.1)

## 2022-09-16 LAB — CBC WITH DIFFERENTIAL/PLATELET
Abs Immature Granulocytes: 0.01 10*3/uL (ref 0.00–0.07)
Basophils Absolute: 0 10*3/uL (ref 0.0–0.1)
Basophils Relative: 0 %
Eosinophils Absolute: 0 10*3/uL (ref 0.0–0.5)
Eosinophils Relative: 0 %
HCT: 43.4 % (ref 36.0–46.0)
Hemoglobin: 13.6 g/dL (ref 12.0–15.0)
Immature Granulocytes: 0 %
Lymphocytes Relative: 39 %
Lymphs Abs: 1.8 10*3/uL (ref 0.7–4.0)
MCH: 26 pg (ref 26.0–34.0)
MCHC: 31.3 g/dL (ref 30.0–36.0)
MCV: 83 fL (ref 80.0–100.0)
Monocytes Absolute: 0.2 10*3/uL (ref 0.1–1.0)
Monocytes Relative: 4 %
Neutro Abs: 2.6 10*3/uL (ref 1.7–7.7)
Neutrophils Relative %: 57 %
Platelets: 185 10*3/uL (ref 150–400)
RBC: 5.23 MIL/uL — ABNORMAL HIGH (ref 3.87–5.11)
RDW: 14.6 % (ref 11.5–15.5)
WBC: 4.7 10*3/uL (ref 4.0–10.5)
nRBC: 0 % (ref 0.0–0.2)

## 2022-09-16 LAB — D-DIMER, QUANTITATIVE: D-Dimer, Quant: 0.62 ug/mL-FEU — ABNORMAL HIGH (ref 0.00–0.50)

## 2022-09-16 LAB — HIV ANTIBODY (ROUTINE TESTING W REFLEX): HIV Screen 4th Generation wRfx: NONREACTIVE

## 2022-09-16 LAB — PROCALCITONIN: Procalcitonin: <0.1 ng/mL

## 2022-09-16 MED ORDER — IPRATROPIUM-ALBUTEROL 0.5-2.5 (3) MG/3ML IN SOLN
3.0000 mL | Freq: Once | RESPIRATORY_TRACT | Status: AC
  Administered 2022-09-16: 3 mL via RESPIRATORY_TRACT
  Filled 2022-09-16: qty 3

## 2022-09-16 MED ORDER — IPRATROPIUM-ALBUTEROL 0.5-2.5 (3) MG/3ML IN SOLN
3.0000 mL | Freq: Three times a day (TID) | RESPIRATORY_TRACT | Status: DC
  Administered 2022-09-16 – 2022-09-17 (×3): 3 mL via RESPIRATORY_TRACT
  Filled 2022-09-16 (×3): qty 3

## 2022-09-16 MED ORDER — SODIUM CHLORIDE 0.9 % IV SOLN
2.0000 g | INTRAVENOUS | Status: DC
  Administered 2022-09-16: 2 g via INTRAVENOUS
  Filled 2022-09-16: qty 20

## 2022-09-16 MED ORDER — IPRATROPIUM-ALBUTEROL 0.5-2.5 (3) MG/3ML IN SOLN: 3.0000 mL | Freq: Three times a day (TID) | RESPIRATORY_TRACT | Status: DC

## 2022-09-16 MED ORDER — SODIUM CHLORIDE 0.9 % IV BOLUS
1000.0000 mL | Freq: Once | INTRAVENOUS | Status: AC
  Administered 2022-09-16: 1000 mL via INTRAVENOUS

## 2022-09-16 MED ORDER — ACETAMINOPHEN 325 MG PO TABS: 650.0000 mg | ORAL_TABLET | Freq: Four times a day (QID) | ORAL | Status: DC | PRN

## 2022-09-16 MED ORDER — IPRATROPIUM-ALBUTEROL 0.5-2.5 (3) MG/3ML IN SOLN: 3.0000 mL | Freq: Four times a day (QID) | RESPIRATORY_TRACT | Status: DC | PRN

## 2022-09-16 MED ORDER — MELATONIN 5 MG PO TABS
5.0000 mg | ORAL_TABLET | Freq: Every evening | ORAL | Status: DC | PRN
  Administered 2022-09-16: 5 mg via ORAL
  Filled 2022-09-16: qty 1

## 2022-09-16 MED ORDER — FLUTICASONE PROPIONATE 50 MCG/ACT NA SUSP
2.0000 | Freq: Every day | NASAL | Status: DC
  Administered 2022-09-17: 2 via NASAL
  Filled 2022-09-16 (×2): qty 16

## 2022-09-16 MED ORDER — IOHEXOL 350 MG/ML SOLN
75.0000 mL | Freq: Once | INTRAVENOUS | Status: AC | PRN
  Administered 2022-09-16: 75 mL via INTRAVENOUS

## 2022-09-16 MED ORDER — ENOXAPARIN SODIUM 40 MG/0.4ML IJ SOSY
40.0000 mg | PREFILLED_SYRINGE | INTRAMUSCULAR | Status: DC
  Administered 2022-09-16: 40 mg via SUBCUTANEOUS
  Filled 2022-09-16: qty 0.4

## 2022-09-16 MED ORDER — SODIUM CHLORIDE 0.9 % IV SOLN: INTRAVENOUS | Status: DC

## 2022-09-16 MED ORDER — ALBUTEROL SULFATE (2.5 MG/3ML) 0.083% IN NEBU
2.5000 mg | INHALATION_SOLUTION | Freq: Once | RESPIRATORY_TRACT | Status: AC
  Administered 2022-09-16: 2.5 mg via RESPIRATORY_TRACT
  Filled 2022-09-16: qty 3

## 2022-09-16 MED ORDER — METHYLPREDNISOLONE SODIUM SUCC 125 MG IJ SOLR: 125.0000 mg | Freq: Once | INTRAMUSCULAR | Status: DC

## 2022-09-16 MED ORDER — SODIUM CHLORIDE 0.9% FLUSH
3.0000 mL | Freq: Two times a day (BID) | INTRAVENOUS | Status: DC
  Administered 2022-09-16 – 2022-09-17 (×2): 3 mL via INTRAVENOUS

## 2022-09-16 MED ORDER — METHYLPREDNISOLONE SODIUM SUCC 125 MG IJ SOLR
80.0000 mg | Freq: Every day | INTRAMUSCULAR | Status: DC
  Administered 2022-09-17: 80 mg via INTRAVENOUS
  Filled 2022-09-16: qty 2

## 2022-09-16 MED ORDER — ACETAMINOPHEN 650 MG RE SUPP: 650.0000 mg | Freq: Four times a day (QID) | RECTAL | Status: DC | PRN

## 2022-09-16 MED ORDER — AZITHROMYCIN 250 MG PO TABS
500.0000 mg | ORAL_TABLET | Freq: Every day | ORAL | Status: DC
  Administered 2022-09-16 – 2022-09-17 (×2): 500 mg via ORAL
  Filled 2022-09-16 (×2): qty 2

## 2022-09-16 NOTE — Assessment & Plan Note (Addendum)
-   Patient was reported to have wheezing by EMS and route and was given Solu-Medrol and DuoNeb -Does not have much wheezing on exam in the ER which may have responded well to treatment -Continue on albuterol inhaler and prednisone at discharge

## 2022-09-16 NOTE — ED Notes (Signed)
ED TO INPATIENT HANDOFF REPORT  ED Nurse Name and Phone #: Alroy Bailiff Name/Age/Gender Martha Koch 67 y.o. female Room/Bed: WA01/WA01  Code Status   Code Status: Full Code  Home/SNF/Other Home Patient oriented to: self, place, time, and situation Is this baseline? Yes   Triage Complete: Triage complete  Chief Complaint Acute hypoxemic respiratory failure ([place]) [J96.01]  Triage Note Pt BIBA from home for exertional [facility] since she was d/c from here with pna diagnosis on Friday. Been taking abx but still having SHOB. O2 sat 88% at home. 91% 4L. [place] and 125mg  solumedrol en route with improvement and O2 up to 94% RA. Exp wheezes all fields. 20ga LAC  106/59 RR 24 HR 108    Allergies Allergies  Allergen Reactions   Aspirin     Stomach cramps   Penicillins Hives    Level of Care/Admitting Diagnosis ED Disposition     ED Disposition  Admit   Condition  --   Comment  Hospital Area: [facility] [[id number]]  Level of Care: Progressive [102]  Admit to Progressive based on following criteria: MULTISYSTEM THREATS such as stable sepsis, metabolic/electrolyte imbalance with or without encephalopathy that is responding to early treatment.  May place patient in observation at [facility] or [facility] if equivalent level of care is available:: No  Covid Evaluation: Confirmed COVID Negative  Diagnosis: Acute hypoxemic respiratory failure [facility]) [id number]  Admitting Physician: Dwyane Dee [phone]  Attending Physician: Dwyane Dee [phone]          B Medical/Surgery History Past Medical History:  Diagnosis Date   Cervical cancer ([place]) cervical   Hyperlipidemia    Hypertension    Radiation 06/02/14-07/13/14   pelvis 45 gray, vaginal cuff/parametrial boost to 50.4 gray   Radiation 04/06/15-05/13/15   anterior abdominal wall 50.4 gray   Past Surgical History:  Procedure Laterality Date   ABDOMINAL HYSTERECTOMY     COLONOSCOPY  2011   MUSCLE  BIOPSY Right 09/14/2021   Procedure: MUSCLE BIOPSY RIGHT THIGH AND RIGHT LOWER LEG;  Surgeon: Stark Klein, MD;  Location: [facility];  Service: General;  Laterality: Right;   robotic radical hysterectomy, bso, pelvic lymphadenectomy Bilateral 04/21/14   TUBAL LIGATION       A IV Location/Drains/Wounds Patient Lines/Drains/Airways Status     Active Line/Drains/Airways     Name Placement date Placement time Site Days   Peripheral IV 09/16/22 20 G Left Antecubital 09/16/22  --  Antecubital  less than 1            Intake/Output Last 24 hours No intake or output data in the 24 hours ending 09/16/22 1820  Labs/Imaging Results for orders placed or performed during the hospital encounter of 09/16/22 (from the past 48 hour(s))  CBC with Differential     Status: Abnormal   Collection Time: 09/16/22  3:07 PM  Result Value Ref Range   WBC 4.7 4.0 - 10.5 K/uL   RBC 5.23 (H) 3.87 - 5.11 MIL/uL   Hemoglobin 13.6 12.0 - 15.0 g/dL   HCT 43.4 36.0 - 46.0 %   MCV 83.0 80.0 - 100.0 fL   MCH 26.0 26.0 - 34.0 pg   MCHC 31.3 30.0 - 36.0 g/dL   RDW 14.6 11.5 - 15.5 %   Platelets 185 150 - 400 K/uL   nRBC 0.0 0.0 - 0.2 %   Neutrophils Relative % 57 %   Neutro Abs 2.6 1.7 - 7.7 K/uL   Lymphocytes Relative 39 %  Lymphs Abs 1.8 0.7 - 4.0 K/uL   Monocytes Relative 4 %   Monocytes Absolute 0.2 0.1 - 1.0 K/uL   Eosinophils Relative 0 %   Eosinophils Absolute 0.0 0.0 - 0.5 K/uL   Basophils Relative 0 %   Basophils Absolute 0.0 0.0 - 0.1 K/uL   Immature Granulocytes 0 %   Abs Immature Granulocytes 0.01 0.00 - 0.07 K/uL    Comment: Performed at [facility], [place] [address] [place], [place] [address]  Comprehensive metabolic panel     Status: Abnormal   Collection Time: 09/16/22  3:07 PM  Result Value Ref Range   Sodium 136 135 - 145 mmol/L   Potassium 3.5 3.5 - 5.1 mmol/L   Chloride 105 98 - 111 mmol/L   CO2 22 22 - 32 mmol/L   Glucose, Bld 131 (H) 70 - 99 mg/dL    Comment:  Glucose reference range applies only to samples taken after fasting for at least 8 hours.   BUN 13 8 - 23 mg/dL   Creatinine, Ser 0.89 0.44 - 1.00 mg/dL   Calcium 8.3 (L) 8.9 - 10.3 mg/dL   Total Protein 6.9 6.5 - 8.1 g/dL   Albumin 3.5 3.5 - 5.0 g/dL   AST 34 15 - 41 U/L   ALT 19 0 - 44 U/L   Alkaline Phosphatase 59 38 - 126 U/L   Total Bilirubin 0.4 0.3 - 1.2 mg/dL   GFR, Estimated >60 >60 mL/min    Comment: (NOTE) Calculated using the CKD-EPI Creatinine Equation (2021)    Anion gap 9 5 - 15    Comment: Performed at [facility], [place] [address], [place], [place] [address]  D-dimer, quantitative     Status: Abnormal   Collection Time: 09/16/22  3:07 PM  Result Value Ref Range   D-Dimer, Quant 0.62 (H) 0.00 - 0.50 ug/mL-FEU    Comment: (NOTE) At the manufacturer cut-off value of 0.5 g/mL FEU, this assay has a negative predictive value of 95-100%.This assay is intended for use in conjunction with a clinical pretest probability (PTP) assessment model to exclude pulmonary embolism (PE) and deep venous thrombosis (DVT) in outpatients suspected of PE or DVT. Results should be correlated with clinical presentation. Performed at [facility], [place] [address] [place], [place] [address]    DG Chest Port 1 View  Result Date: 09/16/2022 CLINICAL DATA:  Shortness of breath EXAM: PORTABLE CHEST 1 VIEW COMPARISON:  09/14/2022 FINDINGS: The heart size and mediastinal contours are within normal limits. Similar appearance of subtle heterogeneous airspace opacity in the perihilar right lung and right lung base. Underlying emphysema. The visualized skeletal structures are unremarkable. IMPRESSION: 1. Similar appearance of subtle heterogeneous airspace opacity in the perihilar right lung and right lung base, concerning for infection or aspiration. 2. Underlying emphysema. Electronically Signed   By: Delanna Ahmadi M.D.   On: 09/16/2022 15:55    Pending  Labs Unresulted Labs (From admission, onward)     Start     Ordered   09/16/22 1809  Procalcitonin  Add-on,   AD       References:    Procalcitonin Lower Respiratory Tract Infection AND Sepsis Procalcitonin Algorithm   09/16/22 1808   09/16/22 1800  Respiratory (~20 pathogens) panel by PCR  (Respiratory panel by PCR (~20 pathogens, ~24 hr TAT)  w precautions)  Once,   R        09/16/22 1759   09/16/22 1759  Legionella Pneumophila  Serogp 1 Ur Ag  Once,   R        09/16/22 1759   09/16/22 1759  Strep pneumoniae urinary antigen  Once,   R        09/16/22 1759   09/16/22 1759  Expectorated Sputum Assessment w Gram Stain, Rflx to Resp Cult  Once,   R        09/16/22 1759   Signed and Held  HIV Antibody (routine testing w rflx)  (HIV Antibody (Routine testing w reflex) panel)  Add-on,   R        Signed and Held   Signed and Held  Basic metabolic panel  Daily,   R     Question:  Specimen collection method  Answer:  Lab=Lab collect   Signed and Held   Signed and Held  CBC with Differential/Platelet  Daily,   R     Question:  Specimen collection method  Answer:  Lab=Lab collect   Signed and Held   Signed and Held  Magnesium  Daily,   R     Question:  Specimen collection method  Answer:  Lab=Lab collect   Signed and Held   Signed and Held  Procalcitonin  Daily,   R     References:    Procalcitonin Lower Respiratory Tract Infection AND Sepsis Procalcitonin Algorithm   Signed and Held            Vitals/Pain Today's Vitals   09/16/22 1630 09/16/22 1645 09/16/22 1700 09/16/22 1715  BP: 108/77 117/76  96/63  Pulse: (!) 104 95 (!) 105 97  Resp: (!) 21 18 17 14   Temp:      TempSrc:      SpO2: 97% 98% 93% 96%  Weight:      Height:      PainSc:        Isolation Precautions Droplet precaution  Medications Medications  0.9 %  sodium chloride infusion (has no administration in time range)  ipratropium-albuterol (DUONEB) 0.5-2.5 (3) MG/3ML nebulizer solution 3 mL (has no  administration in time range)  ipratropium-albuterol (DUONEB) 0.5-2.5 (3) MG/3ML nebulizer solution 3 mL (has no administration in time range)  cefTRIAXone (ROCEPHIN) 2 g in sodium chloride 0.9 % 100 mL IVPB (has no administration in time range)  azithromycin (ZITHROMAX) tablet 500 mg (has no administration in time range)  ipratropium-albuterol (DUONEB) 0.5-2.5 (3) MG/3ML nebulizer solution 3 mL (3 mLs Nebulization Given 09/16/22 1535)  albuterol (PROVENTIL) (2.5 MG/3ML) 0.083% nebulizer solution 2.5 mg (2.5 mg Nebulization Given 09/16/22 1535)  sodium chloride 0.9 % bolus 1,000 mL (1,000 mLs Intravenous New Bag/Given 09/16/22 1755)  iohexol (OMNIPAQUE) 350 MG/ML injection 75 mL (75 mLs Intravenous Contrast Given 09/16/22 1807)    Mobility walks     Focused Assessments    R Recommendations: See Admitting Provider Note  Report given to:   Additional Notes:

## 2022-09-16 NOTE — Assessment & Plan Note (Addendum)
-   Likely this is some contribution from amlodipine last taken on 09/15/2022 with superimposed decreased intake from feeling sick.  She does not appear toxic enough for this to be severe sepsis.  Other considered etiology would be large underlying PE although she is also not as dyspneic or hypoxic as would be expected -Start on fluids for now and monitor for tolerance in case of any worsening shortness of breath

## 2022-09-16 NOTE — Assessment & Plan Note (Addendum)
-   Patient began developing SOB starting 09/14/2022.  She was found to have "subtle patchy airspace opacity in the perihilar right lung and right lung base".  She was started on Levaquin for suspected pneumonia; she has been compliant on Levaquin prior to admission although did have significant nausea at home -Given new hypoxia, hypotension, tachycardia, ongoing SOB with no significant improvement on Levaquin for 2 days, needing to consider other diagnoses which include differential of COPD exac vs PE vs treatment failure (less likely) - CTA chest negative for PE -Oxygen weaned off prior to discharge with lowest saturation 89% on room air while ambulating

## 2022-09-16 NOTE — Assessment & Plan Note (Signed)
-   Followed routinely outpatient.  Last CT chest performed 08/30/2022 noting stable bilateral pulmonary nodules measuring 3.5 mm.  Next scan was recommended for 12 months -Follow-up any further change in nodules on CTA chest

## 2022-09-16 NOTE — ED Notes (Signed)
Ambulated pt, pt was light-headed and dizzy throughout the walk and [facility] towards the end of the walk. SpO2 stayed between 90-98%. HR went from 104 when we initially began to increase throughout the walk , highest being 126.

## 2022-09-16 NOTE — Assessment & Plan Note (Signed)
-   Hold amlodipine in setting of hypotension

## 2022-09-16 NOTE — Assessment & Plan Note (Addendum)
-   Patient recently presented on 09/14/2022 with cough, SOB, and started on Levaquin for suspected pneumonia involving right lung -Continued on antibiotics but transitioned to Rocephin and azithromycin while inpatient -Strep urinary antigen negative.  Legionella pending at discharge -COVID, flu, RSV negative on 09/14/2022 - RVP positive for metapneumovirus.  Steroid continued at discharge -CTA chest notable for better evaluation of pneumonia noting opacities in RUL, RLL, LUL -Transition antibiotics to cefpodoxime and azithromycin to complete course at discharge

## 2022-09-16 NOTE — H&P (Signed)
History and Physical    Martha Koch  [id number]  DOB: June 14, 1956  DOA: 09/16/2022  PCP: Charlott Rakes, MD Patient coming from: Home  Chief Complaint: SOB, cough, low BP  HPI:  Martha Koch is a 67 yo female with PMH emphysema, pulmonary nodules, HTN, HLD, cervical cancer s/p chemoradiation who presents to the ER with worsening shortness of breath. She was recently evaluated in the ER on 09/14/2022 for similar complaint.  She was noted to have worsening cough and had recently quit smoking.  CXR at that time had shown subtle patchy airspace opacities in the perihilar right lung and right lung base.  She was considered stable for trial of outpatient antibiotics and was discharged home with Levaquin.  She states that she did take Levaquin on Saturday and Sunday.  Her cough has remained productive of yellow sputum and usually she has no baseline cough.  She also has had low blood pressure since Friday, 80s over 60s and typically runs normal blood pressures.  She even takes amlodipine for hypertension.  She did not take amlodipine on Saturday. She does state that after quitting smoking on Monday, 09/10/2022 she has been laying in bed essentially all day except to get up to eat or go to the bathroom.  Then, beginning on Friday, 09/14/2022 she began feeling short of breath with the onset of her symptoms.  In the ER, it appears that her blood pressure has remained hypotensive and she is tachycardic.  She remains hypoxic on 2 L which is new in onset which prompted her to come to the ER. D-dimer was mildly elevated, 0.62.  CXR was repeated which showed similar appearance of heterogenous airspace opacities seen on prior CXR from 09/14/2022. Patient is admitted for further workup including ruling out PE at this time.  I have personally briefly reviewed patient's old medical records in [facility] and discussed patient with the ER provider when appropriate/indicated.  Assessment and Plan: *  Acute hypoxemic respiratory failure ([place]) - Patient began developing SOB starting 09/14/2022.  She was found to have "subtle patchy airspace opacity in the perihilar right lung and right lung base".  She was started on Levaquin for suspected pneumonia; she has been compliant on Levaquin prior to admission although did have significant nausea at home -Given new hypoxia, hypotension, tachycardia, ongoing SOB with no significant improvement on Levaquin for 2 days, needing to consider other diagnoses which include differential of COPD exac vs PE vs treatment failure (less likely) - obtain CTA chest; discussed with patient who is amenable - s/p solumedrol by EMS, continue solumedrol - wean O2 as able  CAP (community acquired pneumonia) - Patient recently presented on 09/14/2022 with cough, SOB, and started on Levaquin for suspected pneumonia involving right lung -Continue on antibiotics but transition to Rocephin and azithromycin for now - Obtain sputum culture if able - Obtain Legionella and strep urinary antigens -Recent negative COVID, flu, RSV on 09/14/2022.  Will not repeat. - Obtain RVP for thoroughness  Hypotension - Likely this is some contribution from amlodipine last taken on 09/15/2022 with superimposed decreased intake from feeling sick.  She does not appear toxic enough for this to be severe sepsis.  Other considered etiology would be large underlying PE although she is also not as dyspneic or hypoxic as would be expected -Start on fluids for now and monitor for tolerance in case of any worsening shortness of breath  Former tobacco use - Patient states she quit smoking cold [place] on 09/10/2022 -  Prior use was 0.5 - 1 PPD x 30 years  COPD exacerbation ([place]) - Patient was reported to have wheezing by EMS and route and was given Solu-Medrol and DuoNeb -Does not have much wheezing on exam in the ER which may have responded well to treatment - Continue on DuoNebs and steroids for  now  Pulmonary nodules - Followed routinely outpatient.  Last CT chest performed 08/30/2022 noting stable bilateral pulmonary nodules measuring 3.5 mm.  Next scan was recommended for 12 months -Follow-up any further change in nodules on CTA chest  Essential hypertension - Hold amlodipine in setting of hypotension    Code Status:     Code Status: Full Code  DVT Prophylaxis: Lovenox     Anticipated disposition is to: Home  History: Past Medical History:  Diagnosis Date   Cervical cancer ([place]) cervical   Hyperlipidemia    Hypertension    Radiation 06/02/14-07/13/14   pelvis 45 gray, vaginal cuff/parametrial boost to 50.4 gray   Radiation 04/06/15-05/13/15   anterior abdominal wall 50.4 gray    Past Surgical History:  Procedure Laterality Date   ABDOMINAL HYSTERECTOMY     COLONOSCOPY  2011   MUSCLE BIOPSY Right 09/14/2021   Procedure: MUSCLE BIOPSY RIGHT THIGH AND RIGHT LOWER LEG;  Surgeon: Stark Klein, MD;  Location: [facility];  Service: General;  Laterality: Right;   robotic radical hysterectomy, bso, pelvic lymphadenectomy Bilateral 04/21/14   TUBAL LIGATION       reports that she quit smoking 4 days ago. Her smoking use included cigarettes. She has a 15.00 pack-year smoking history. She has never used smokeless tobacco. She reports that she does not drink alcohol and does not use drugs.  Allergies  Allergen Reactions   Aspirin     Stomach cramps   Penicillins Hives    Family History  Problem Relation Age of Onset   Hypertension Father    Breast cancer Maternal Aunt        breast cancer, x 4 aunts    Breast cancer Daughter    Cancer Other    Diabetes Other    Colon cancer Neg Hx    Colon polyps Neg Hx    Esophageal cancer Neg Hx    Stomach cancer Neg Hx    Rectal cancer Neg Hx    Neuropathy Neg Hx    Home Medications: Prior to Admission medications   Medication Sig Start Date End Date Taking? Authorizing Provider  amLODipine (NORVASC) 10 MG tablet Take 1  tablet (10 mg total) by mouth daily. 09/12/22   Charlott Rakes, MD  atorvastatin (LIPITOR) 20 MG tablet Take 1 tablet (20 mg total) by mouth daily. 08/02/22   Charlott Rakes, MD  cetirizine (ZYRTEC) 10 MG tablet Take 1 tablet (10 mg total) by mouth daily. 08/02/22   Charlott Rakes, MD  fluticasone (FLONASE) 50 MCG/ACT nasal spray Place 2 sprays into both nostrils daily. 08/02/22   Charlott Rakes, MD  levofloxacin (LEVAQUIN) 500 MG tablet Take 1 tablet (500 mg total) by mouth daily. 09/14/22   Sherrill Raring, PA-C  naproxen (NAPROSYN) 500 MG tablet Take 1 tablet (500 mg total) by mouth 2 times daily with meals for 30 days then reduce to 1 tablet (500 mg total) by mouth nightly. 08/21/22     nicotine (NICODERM CQ - DOSED IN MG/24 HOURS) 14 mg/24hr patch Place 1 patch (14 mg total) topically onto the skin once daily. (Take off the old patch before applying a new one). Patient not taking: Reported  on 08/02/2022 12/12/21   Charlott Rakes, MD  nicotine polacrilex (EQ NICOTINE POLACRILEX) 4 MG lozenge Use 1 lozenge (4mg  total) once every 1-2 hours as needed for cravings. (No more than 20 lozenges per day). Patient not taking: Reported on 08/02/2022 12/12/21   Charlott Rakes, MD    Review of Systems:  Review of Systems  Constitutional:  Negative for chills and fever.  HENT: Negative.    Eyes: Negative.   Respiratory:  Positive for cough, sputum production, shortness of breath and wheezing. Negative for hemoptysis.   Cardiovascular: Negative.   Gastrointestinal:  Positive for nausea.  Genitourinary: Negative.   Musculoskeletal: Negative.   Skin: Negative.   Neurological: Negative.   Endo/Heme/Allergies: Negative.   Psychiatric/Behavioral: Negative.      Physical Exam:  Vitals:   09/16/22 1630 09/16/22 1645 09/16/22 1700 09/16/22 1715  BP: 108/77 117/76  96/63  Pulse: (!) 104 95 (!) 105 97  Resp: (!) 21 18 17 14   Temp:      TempSrc:      SpO2: 97% 98% 93% 96%  Weight:      Height:       Physical  Exam Constitutional:      General: She is not in acute distress.    Appearance: She is well-developed. She is not ill-appearing or toxic-appearing.  HENT:     Head: Normocephalic and atraumatic.     Mouth/Throat:     Mouth: Mucous membranes are moist.  Eyes:     Extraocular Movements: Extraocular movements intact.  Cardiovascular:     Rate and Rhythm: Regular rhythm. Tachycardia present.  Pulmonary:     Effort: Pulmonary effort is normal. No respiratory distress.     Breath sounds: Rhonchi present. No wheezing.  Abdominal:     General: Bowel sounds are normal. There is no distension.     Palpations: Abdomen is soft.     Tenderness: There is no abdominal tenderness.  Musculoskeletal:        General: No swelling. Normal range of motion.     Cervical back: Normal range of motion.  Skin:    General: Skin is warm and dry.  Neurological:     General: No focal deficit present.     Mental Status: She is alert and oriented to person, place, and time.  Psychiatric:        Mood and Affect: Mood normal.        Behavior: Behavior normal.      Labs on Admission:  I have personally reviewed following labs and imaging studies Results for orders placed or performed during the hospital encounter of 09/16/22 (from the past 24 hour(s))  CBC with Differential     Status: Abnormal   Collection Time: 09/16/22  3:07 PM  Result Value Ref Range   WBC 4.7 4.0 - 10.5 K/uL   RBC 5.23 (H) 3.87 - 5.11 MIL/uL   Hemoglobin 13.6 12.0 - 15.0 g/dL   HCT 43.4 36.0 - 46.0 %   MCV 83.0 80.0 - 100.0 fL   MCH 26.0 26.0 - 34.0 pg   MCHC 31.3 30.0 - 36.0 g/dL   RDW 14.6 11.5 - 15.5 %   Platelets 185 150 - 400 K/uL   nRBC 0.0 0.0 - 0.2 %   Neutrophils Relative % 57 %   Neutro Abs 2.6 1.7 - 7.7 K/uL   Lymphocytes Relative 39 %   Lymphs Abs 1.8 0.7 - 4.0 K/uL   Monocytes Relative 4 %   Monocytes Absolute  0.2 0.1 - 1.0 K/uL   Eosinophils Relative 0 %   Eosinophils Absolute 0.0 0.0 - 0.5 K/uL   Basophils  Relative 0 %   Basophils Absolute 0.0 0.0 - 0.1 K/uL   Immature Granulocytes 0 %   Abs Immature Granulocytes 0.01 0.00 - 0.07 K/uL  Comprehensive metabolic panel     Status: Abnormal   Collection Time: 09/16/22  3:07 PM  Result Value Ref Range   Sodium 136 135 - 145 mmol/L   Potassium 3.5 3.5 - 5.1 mmol/L   Chloride 105 98 - 111 mmol/L   CO2 22 22 - 32 mmol/L   Glucose, Bld 131 (H) 70 - 99 mg/dL   BUN 13 8 - 23 mg/dL   Creatinine, Ser 0.89 0.44 - 1.00 mg/dL   Calcium 8.3 (L) 8.9 - 10.3 mg/dL   Total Protein 6.9 6.5 - 8.1 g/dL   Albumin 3.5 3.5 - 5.0 g/dL   AST 34 15 - 41 U/L   ALT 19 0 - 44 U/L   Alkaline Phosphatase 59 38 - 126 U/L   Total Bilirubin 0.4 0.3 - 1.2 mg/dL   GFR, Estimated >60 >60 mL/min   Anion gap 9 5 - 15  D-dimer, quantitative     Status: Abnormal   Collection Time: 09/16/22  3:07 PM  Result Value Ref Range   D-Dimer, Quant 0.62 (H) 0.00 - 0.50 ug/mL-FEU     Radiological Exams on Admission: DG Chest Port 1 View  Result Date: 09/16/2022 CLINICAL DATA:  Shortness of breath EXAM: PORTABLE CHEST 1 VIEW COMPARISON:  09/14/2022 FINDINGS: The heart size and mediastinal contours are within normal limits. Similar appearance of subtle heterogeneous airspace opacity in the perihilar right lung and right lung base. Underlying emphysema. The visualized skeletal structures are unremarkable. IMPRESSION: 1. Similar appearance of subtle heterogeneous airspace opacity in the perihilar right lung and right lung base, concerning for infection or aspiration. 2. Underlying emphysema. Electronically Signed   By: Delanna Ahmadi M.D.   On: 09/16/2022 15:55   DG Chest Port 1 View  Final Result    CT Angio Chest Pulmonary Embolism (PE) W or WO Contrast    (Results Pending)    Dwyane Dee, MD [facility] 09/16/2022, 6:09 PM

## 2022-09-16 NOTE — ED Triage Notes (Signed)
Pt BIBA from home for exertional [facility] since she was d/c from here with pna diagnosis on Friday. Been taking abx but still having SHOB. O2 sat 88% at home. 91% 4L. Duneb and 125mg  solumedrol en route with improvement and O2 up to 94% RA. Exp wheezes all fields. 20ga LAC  106/59 RR 24 HR 108

## 2022-09-16 NOTE — Hospital Course (Addendum)
Martha Koch is a 67 yo female with PMH emphysema, pulmonary nodules, HTN, HLD, cervical cancer s/p chemoradiation who presents to the ER with worsening shortness of breath. She was recently evaluated in the ER on 09/14/2022 for similar complaint.  She was noted to have worsening cough and had recently quit smoking.  CXR at that time had shown subtle patchy airspace opacities in the perihilar right lung and right lung base.  She was considered stable for trial of outpatient antibiotics and was discharged home with Levaquin.  She states that she did take Levaquin on Saturday and Sunday.  Her cough remained productive of yellow sputum and usually she has no baseline cough.  She also has had low blood pressure since Friday, 80s over 60s and typically runs normal blood pressures.  She even takes amlodipine for hypertension.  She did not take amlodipine on Saturday. She does state that after quitting smoking on Monday, 09/10/2022 she has been laying in bed essentially most days except to get up to eat or go to the bathroom.  Then, beginning on Friday, 09/14/2022 she began feeling short of breath with the onset of her symptoms.  In the ER, she remained hypotensive (80s/60s) and tachycardic 110s.  She was hypoxic on 2 L which is new in onset which prompted her to come to the ER. D-dimer was mildly elevated, 0.62.  CXR was repeated which showed similar appearance of heterogenous airspace opacities seen on prior CXR from 09/14/2022. She underwent CTA chest to rule out PE which was negative.  It was notable for better evaluation which showed patchy airspace disease involving right upper and lower lobe as well as left upper lobe. New 6 mm left lower lobe opacity noted as well, suspected to be infectious/inflammatory but is recommended for repeat CT in 3-6 months.  She already routinely undergoes surveillance outpatient for pulmonary nodules.  RVP was also obtained on admission and was positive for metapneumovirus.  She  was continued on steroids given her hypoxia.  She was able to ambulate in the hall on room air prior to discharge with lowest saturation noted at 89%.  She did not warrant oxygen and oxygenation did recover quickly with rest. She is prescribed cefpodoxime, azithromycin, and prednisone to complete courses at discharge.  Albuterol inhaler also supplied in case of any further shortness of breath or wheezing.

## 2022-09-16 NOTE — Assessment & Plan Note (Signed)
-   Patient states she quit smoking cold [place] on 09/10/2022 - Prior use was 0.5 - 1 PPD x 30 years

## 2022-09-16 NOTE — ED Provider Notes (Signed)
[place] EMERGENCY DEPARTMENT AT [facility] Provider Note   CSN: [id number] Arrival date & time: 09/16/22  1431     History {Add pertinent medical, surgical, social history, OB history to HPI:1} Chief Complaint  Patient presents with   Shortness of Breath    Martha Koch is a 67 y.o. female.  Patient was diagnosed with pneumonia couple days ago.  She complains of worsening shortness of breath.  She has a 30-year pack history of smoking.  Patient has a history of hypertension also   Shortness of Breath      Home Medications Prior to Admission medications   Medication Sig Start Date End Date Taking? Authorizing Provider  amLODipine (NORVASC) 10 MG tablet Take 1 tablet (10 mg total) by mouth daily. 09/12/22   Charlott Rakes, MD  atorvastatin (LIPITOR) 20 MG tablet Take 1 tablet (20 mg total) by mouth daily. 08/02/22   Charlott Rakes, MD  cetirizine (ZYRTEC) 10 MG tablet Take 1 tablet (10 mg total) by mouth daily. 08/02/22   Charlott Rakes, MD  fluticasone (FLONASE) 50 MCG/ACT nasal spray Place 2 sprays into both nostrils daily. 08/02/22   Charlott Rakes, MD  levofloxacin (LEVAQUIN) 500 MG tablet Take 1 tablet (500 mg total) by mouth daily. 09/14/22   Sherrill Raring, PA-C  naproxen (NAPROSYN) 500 MG tablet Take 1 tablet (500 mg total) by mouth 2 times daily with meals for 30 days then reduce to 1 tablet (500 mg total) by mouth nightly. 08/21/22     nicotine (NICODERM CQ - DOSED IN MG/24 HOURS) 14 mg/24hr patch Place 1 patch (14 mg total) topically onto the skin once daily. (Take off the old patch before applying a new one). Patient not taking: Reported on 08/02/2022 12/12/21   Charlott Rakes, MD  nicotine polacrilex (EQ NICOTINE POLACRILEX) 4 MG lozenge Use 1 lozenge (4mg  total) once every 1-2 hours as needed for cravings. (No more than 20 lozenges per day). Patient not taking: Reported on 08/02/2022 12/12/21   Charlott Rakes, MD      Allergies    Aspirin and Penicillins     Review of Systems   Review of Systems  Respiratory:  Positive for shortness of breath.     Physical Exam Updated Vital Signs BP 117/76   Pulse 95   Temp 98.2 F (36.8 C) (Oral)   Resp 18   Ht 5\' 7"  (1.702 m)   Wt 64.4 kg   SpO2 98%   BMI 22.24 kg/m  Physical Exam  ED Results / Procedures / Treatments   Labs (all labs ordered are listed, but only abnormal results are displayed) Labs Reviewed  CBC WITH DIFFERENTIAL/PLATELET - Abnormal; Notable for the following components:      Result Value   RBC 5.23 (*)    All other components within normal limits  COMPREHENSIVE METABOLIC PANEL - Abnormal; Notable for the following components:   Glucose, Bld 131 (*)    Calcium 8.3 (*)    All other components within normal limits  D-DIMER, QUANTITATIVE - Abnormal; Notable for the following components:   D-Dimer, Quant 0.62 (*)    All other components within normal limits    EKG None  Radiology DG Chest Port 1 View  Result Date: 09/16/2022 CLINICAL DATA:  Shortness of breath EXAM: PORTABLE CHEST 1 VIEW COMPARISON:  09/14/2022 FINDINGS: The heart size and mediastinal contours are within normal limits. Similar appearance of subtle heterogeneous airspace opacity in the perihilar right lung and right lung base. Underlying  emphysema. The visualized skeletal structures are unremarkable. IMPRESSION: 1. Similar appearance of subtle heterogeneous airspace opacity in the perihilar right lung and right lung base, concerning for infection or aspiration. 2. Underlying emphysema. Electronically Signed   By: Delanna Ahmadi M.D.   On: 09/16/2022 15:55    Procedures Procedures  {Document cardiac monitor, telemetry assessment procedure when appropriate:1}  Medications Ordered in ED Medications  ipratropium-albuterol (DUONEB) 0.5-2.5 (3) MG/3ML nebulizer solution 3 mL (3 mLs Nebulization Given 09/16/22 1535)  albuterol (PROVENTIL) (2.5 MG/3ML) 0.083% nebulizer solution 2.5 mg (2.5 mg Nebulization  Given 09/16/22 1535)    ED Course/ Medical Decision Making/ A&P   {Patient ambulated after getting neb treatments and her sats were 90% but she was dyspneic and tachycardic at 125. Click here for ABCD2, HEART and other calculatorsREFRESH Note before signing :1}                          Medical Decision Making Amount and/or Complexity of Data Reviewed Labs: ordered. Radiology: ordered.  Risk Prescription drug management. Decision regarding hospitalization.   Patient with COPD exacerbation and pneumonia.  She will be admitted to medicine  {Document critical care time when appropriate:1} {Document review of labs and clinical decision tools ie heart score, Chads2Vasc2 etc:1}  {Document your independent review of radiology images, and any outside records:1} {Document your discussion with family members, caretakers, and with consultants:1} {Document social determinants of health affecting pt's care:1} {Document your decision making why or why not admission, treatments were needed:1} Final Clinical Impression(s) / ED Diagnoses Final diagnoses:  COPD exacerbation ([place])  Community acquired pneumonia, unspecified laterality    Rx / DC Orders ED Discharge Orders     None

## 2022-09-17 ENCOUNTER — Other Ambulatory Visit: Payer: Self-pay

## 2022-09-17 DIAGNOSIS — J189 Pneumonia, unspecified organism: Secondary | ICD-10-CM | POA: Diagnosis not present

## 2022-09-17 DIAGNOSIS — J9601 Acute respiratory failure with hypoxia: Secondary | ICD-10-CM | POA: Diagnosis not present

## 2022-09-17 DIAGNOSIS — R918 Other nonspecific abnormal finding of lung field: Secondary | ICD-10-CM

## 2022-09-17 DIAGNOSIS — I959 Hypotension, unspecified: Secondary | ICD-10-CM | POA: Diagnosis not present

## 2022-09-17 DIAGNOSIS — J441 Chronic obstructive pulmonary disease with (acute) exacerbation: Secondary | ICD-10-CM | POA: Diagnosis not present

## 2022-09-17 LAB — CBC WITH DIFFERENTIAL/PLATELET
Abs Immature Granulocytes: 0.01 10*3/uL (ref 0.00–0.07)
Basophils Absolute: 0 10*3/uL (ref 0.0–0.1)
Basophils Relative: 1 %
Eosinophils Absolute: 0 10*3/uL (ref 0.0–0.5)
Eosinophils Relative: 0 %
HCT: 41.3 % (ref 36.0–46.0)
Hemoglobin: 13.3 g/dL (ref 12.0–15.0)
Immature Granulocytes: 0 %
Lymphocytes Relative: 36 %
Lymphs Abs: 1.2 10*3/uL (ref 0.7–4.0)
MCH: 26.4 pg (ref 26.0–34.0)
MCHC: 32.2 g/dL (ref 30.0–36.0)
MCV: 81.9 fL (ref 80.0–100.0)
Monocytes Absolute: 0.2 10*3/uL (ref 0.1–1.0)
Monocytes Relative: 6 %
Neutro Abs: 1.9 10*3/uL (ref 1.7–7.7)
Neutrophils Relative %: 57 %
Platelets: 201 10*3/uL (ref 150–400)
RBC: 5.04 MIL/uL (ref 3.87–5.11)
RDW: 14.6 % (ref 11.5–15.5)
WBC: 3.3 10*3/uL — ABNORMAL LOW (ref 4.0–10.5)
nRBC: 0 % (ref 0.0–0.2)

## 2022-09-17 LAB — RESPIRATORY PANEL BY PCR

## 2022-09-17 LAB — BASIC METABOLIC PANEL
Anion gap: 9 (ref 5–15)
BUN: 12 mg/dL (ref 8–23)
CO2: 22 mmol/L (ref 22–32)
Calcium: 8.6 mg/dL — ABNORMAL LOW (ref 8.9–10.3)
Chloride: 108 mmol/L (ref 98–111)
Creatinine, Ser: 0.84 mg/dL (ref 0.44–1.00)
GFR, Estimated: >60 mL/min (ref >60)
Glucose, Bld: 149 mg/dL — ABNORMAL HIGH (ref 70–99)
Potassium: 3.9 mmol/L (ref 3.5–5.1)
Sodium: 139 mmol/L (ref 135–145)

## 2022-09-17 LAB — PROCALCITONIN: Procalcitonin: <0.1 ng/mL

## 2022-09-17 LAB — STREP PNEUMONIAE URINARY ANTIGEN: Strep Pneumo Urinary Antigen: NEGATIVE

## 2022-09-17 LAB — MAGNESIUM: Magnesium: 2.2 mg/dL (ref 1.7–2.4)

## 2022-09-17 MED ORDER — PREDNISONE 20 MG PO TABS
40.0000 mg | ORAL_TABLET | Freq: Every day | ORAL | 0 refills | Status: AC
  Filled 2022-09-17: qty 10, 5d supply, fill #0

## 2022-09-17 MED ORDER — ALBUTEROL SULFATE HFA 108 (90 BASE) MCG/ACT IN AERS
2.0000 | INHALATION_SPRAY | Freq: Four times a day (QID) | RESPIRATORY_TRACT | 2 refills | Status: DC | PRN
  Filled 2022-09-17: qty 8.5, 25d supply, fill #0
  Filled 2022-09-17: qty 6.7, 25d supply, fill #0

## 2022-09-17 MED ORDER — CEFPODOXIME PROXETIL 200 MG PO TABS
200.0000 mg | ORAL_TABLET | Freq: Two times a day (BID) | ORAL | 0 refills | Status: AC
  Filled 2022-09-17: qty 8, 4d supply, fill #0

## 2022-09-17 MED ORDER — AZITHROMYCIN 500 MG PO TABS
500.0000 mg | ORAL_TABLET | Freq: Every day | ORAL | 0 refills | Status: AC
  Filled 2022-09-17: qty 4, 4d supply, fill #0

## 2022-09-17 NOTE — Telephone Encounter (Signed)
Agree with disposition. 

## 2022-09-17 NOTE — TOC Initial Note (Signed)
Transition of Care [facility]) - Initial/Assessment Note    Patient Details  Name: Martha Koch MRN: [id number] Date of Birth: 19-Jul-1955   Transition of Care [facility]) CM/SW Contact:    Ninfa Meeker, RN Phone Number: 09/17/2022, 8:53 AM  Clinical Narrative:                    Transition of Care [facility]) Department has reviewed patient and no TOC needs have been identified at this time. We will continue to monitor patient advancement through Interdisciplinary progressions and if new patient needs arise, please place a consult.     Patient Goals and CMS Choice            Expected Discharge Plan and Services                                              Prior Living Arrangements/Services                       Activities of Daily Living Home Assistive Devices/Equipment: Eyeglasses ADL Screening (condition at time of admission) Patient's cognitive ability adequate to safely complete daily activities?: Yes Is the patient deaf or have difficulty hearing?: No Does the patient have difficulty seeing, even when wearing glasses/contacts?: No Does the patient have difficulty concentrating, remembering, or making decisions?: No Patient able to express need for assistance with ADLs?: Yes Does the patient have difficulty dressing or bathing?: No Independently performs ADLs?: Yes (appropriate for developmental age) Does the patient have difficulty walking or climbing stairs?: Yes Weakness of Legs: Both Weakness of Arms/Hands: None  Permission Sought/Granted                  Emotional Assessment              Admission diagnosis:  COPD exacerbation ([place]) [J44.1] Acute hypoxemic respiratory failure ([place]) [J96.01] Community acquired pneumonia, unspecified laterality [J18.9] Patient Active Problem List   Diagnosis Date Noted   Acute hypoxemic respiratory failure ([place]) 09/16/2022   CAP (community acquired pneumonia) 09/16/2022   Hypotension 09/16/2022    Former tobacco use 09/16/2022   Atrophy of muscle of right lower leg 08/23/2021   Right leg weakness 08/23/2021   Elevated blood pressure reading in office with diagnosis of hypertension 04/18/2021   Dyslipidemia 04/18/2021   Prediabetes 04/18/2021   Body mass index (BMI) 19.9 or less, adult 04/18/2021   Pancreatic cyst 09/18/2016   COPD exacerbation ([place]) 04/23/2016   Mild airflow obstruction on pulmonary function test 03/18/2016   Abnormal PFT 03/18/2016   Diffusion capacity of lung (dl), decreased 03/18/2016   Abnormal CT of the chest 02/16/2016   Pulmonary nodules 02/16/2016   Recurrent cervical cancer ([place]) 03/04/2015   Abdominal wall mass of left upper quadrant 01/13/2015   Cervical cancer, FIGO stage IB1 ([place]) 11/20/2014   Continuous tobacco abuse 11/20/2014   Dense breast tissue 11/20/2014   Cervical cancer ([place]) 04/12/2014   Ovarian mass 04/12/2014   Loss of weight 03/02/2014   Essential hypertension 03/02/2014   Nicotine dependence 03/02/2014   PCP:  Charlott Rakes, MD Pharmacy:   [id number] [address], [place] AT [facility] OF [address] [place] [address] Phone: [phone] Fax: [place] [address] [place] [address] Phone: [phone] Fax: [place] [address] Phone: [phone] Fax: [phone]  [place]), [place] - [address] [place] [address] Phone: [phone] Fax: [phone]     Social Determinants of Health (SDOH) Social History: SDOH Screenings   Food Insecurity: No Food Insecurity (09/16/2022)  Housing: Low Risk  (09/16/2022)  Transportation Needs: No Transportation Needs (09/16/2022)  Utilities: Not At Risk (09/16/2022)  Depression  (PHQ2-9): Low Risk  (08/02/2022)  Tobacco Use: Medium Risk (09/16/2022)   SDOH Interventions:     Readmission Risk Interventions     No data to display

## 2022-09-17 NOTE — Discharge Summary (Signed)
Physician Discharge Summary   Martha Koch [id number] DOB: August 09, 1955 DOA: 09/16/2022  PCP: Charlott Rakes, MD  Admit date: 09/16/2022 Discharge date: 09/17/2022  Admitted From: Home Disposition:  Home Discharging physician: Dwyane Dee, MD Barriers to discharge: none  Recommendations for Outpatient Follow-up:  CTA chest noted 6 mm ill-defined nodular opacity in the LLL new since last CT.  Recommended for repeat CT chest in 3 to 6 months.  Likely this opacity is infectious/inflammatory but given history of nodules, will need follow-up  Discharge Condition: stable CODE STATUS: Full Diet recommendation:  Diet Orders (From admission, onward)     Start     Ordered   09/17/22 0000  Diet general        09/17/22 1254   09/16/22 1755  Diet regular Fluid consistency: Thin  Diet effective now       Question:  Fluid consistency:  Answer:  Thin   09/16/22 1754            Hospital Course: [name] is a 67 yo female with PMH emphysema, pulmonary nodules, HTN, HLD, cervical cancer s/p chemoradiation who presents to the ER with worsening shortness of breath. She was recently evaluated in the ER on 09/14/2022 for similar complaint.  She was noted to have worsening cough and had recently quit smoking.  CXR at that time had shown subtle patchy airspace opacities in the perihilar right lung and right lung base.  She was considered stable for trial of outpatient antibiotics and was discharged home with Levaquin.  She states that she did take Levaquin on Saturday and Sunday.  Her cough remained productive of yellow sputum and usually she has no baseline cough.  She also has had low blood pressure since Friday, 80s over 60s and typically runs normal blood pressures.  She even takes amlodipine for hypertension.  She did not take amlodipine on Saturday. She does state that after quitting smoking on Monday, 09/10/2022 she has been laying in bed essentially most days except to get up to eat or  go to the bathroom.  Then, beginning on Friday, 09/14/2022 she began feeling short of breath with the onset of her symptoms.  In the ER, she remained hypotensive (80s/60s) and tachycardic 110s.  She was hypoxic on 2 L which is new in onset which prompted her to come to the ER. D-dimer was mildly elevated, 0.62.  CXR was repeated which showed similar appearance of heterogenous airspace opacities seen on prior CXR from 09/14/2022. She underwent CTA chest to rule out PE which was negative.  It was notable for better evaluation which showed patchy airspace disease involving right upper and lower lobe as well as left upper lobe. New 6 mm left lower lobe opacity noted as well, suspected to be infectious/inflammatory but is recommended for repeat CT in 3-6 months.  She already routinely undergoes surveillance outpatient for pulmonary nodules.  RVP was also obtained on admission and was positive for metapneumovirus.  She was continued on steroids given her hypoxia.  She was able to ambulate in the hall on room air prior to discharge with lowest saturation noted at 89%.  She did not warrant oxygen and oxygenation did recover quickly with rest. She is prescribed cefpodoxime, azithromycin, and prednisone to complete courses at discharge.  Albuterol inhaler also supplied in case of any further shortness of breath or wheezing.  Assessment and Plan: * Acute hypoxemic respiratory failure (HCC)-resolved as of 09/17/2022 - Patient began developing SOB starting 09/14/2022.  She was found  to have "subtle patchy airspace opacity in the perihilar right lung and right lung base".  She was started on Levaquin for suspected pneumonia; she has been compliant on Levaquin prior to admission although did have significant nausea at home -Given new hypoxia, hypotension, tachycardia, ongoing SOB with no significant improvement on Levaquin for 2 days, needing to consider other diagnoses which include differential of COPD exac vs PE vs  treatment failure (less likely) - CTA chest negative for PE -Oxygen weaned off prior to discharge with lowest saturation 89% on room air while ambulating  CAP (community acquired pneumonia) - Patient recently presented on 09/14/2022 with cough, SOB, and started on Levaquin for suspected pneumonia involving right lung -Continued on antibiotics but transitioned to Rocephin and azithromycin while inpatient -Strep urinary antigen negative.  Legionella pending at discharge -COVID, flu, RSV negative on 09/14/2022 - RVP positive for metapneumovirus.  Steroid continued at discharge -CTA chest notable for better evaluation of pneumonia noting opacities in RUL, RLL, LUL -Transition antibiotics to cefpodoxime and azithromycin to complete course at discharge  Hypotension-resolved as of 09/17/2022 - Likely this is some contribution from amlodipine last taken on 09/15/2022 with superimposed decreased intake from feeling sick.  She does not appear toxic enough for this to be severe sepsis.  Other considered etiology would be large underlying PE although she is also not as dyspneic or hypoxic as would be expected -Resolved with fluids - Okay to resume blood pressure medications at discharge  Former tobacco use - Patient states she quit smoking cold [place] on 09/10/2022 - Prior use was 0.5 - 1 PPD x 30 years  COPD exacerbation ([place]) - Patient was reported to have wheezing by EMS and route and was given Solu-Medrol and DuoNeb -Does not have much wheezing on exam in the ER which may have responded well to treatment -Continue on albuterol inhaler and prednisone at discharge  Pulmonary nodules - Followed routinely outpatient.  Last CT chest performed 08/30/2022 noting stable bilateral pulmonary nodules measuring 3.5 mm.  Next scan was recommended for 12 months -CTA chest noted 6 mm ill-defined nodular opacity in the LLL new since last CT.  Recommended for repeat CT chest in 3 to 6 months.  Likely this opacity is  infectious/inflammatory but given history of nodules, will need follow-up  Essential hypertension - Blood pressure returned to normal prior to discharge - Amlodipine resumed   The patient's chronic medical conditions were treated accordingly per the patient's home medication regimen except as noted.  On day of discharge, patient was felt deemed stable for discharge. Patient/family member advised to call PCP or come back to ER if needed.   Principal Diagnosis: Acute hypoxemic respiratory failure [facility])  Discharge Diagnoses: Active Hospital Problems   Diagnosis Date Noted   CAP (community acquired pneumonia) 09/16/2022    Priority: 1.   Former tobacco use 09/16/2022   COPD exacerbation ([place]) 04/23/2016   Pulmonary nodules 02/16/2016   Essential hypertension 03/02/2014    Resolved Hospital Problems   Diagnosis Date Noted Date Resolved   Acute hypoxemic respiratory failure ([place]) 09/16/2022 09/17/2022    Priority: 1.   Hypotension 09/16/2022 09/17/2022    Priority: 2.     Discharge Instructions     Diet general   Complete by: As directed    Increase activity slowly   Complete by: As directed       Allergies as of 09/17/2022       Reactions   Aspirin    Stomach cramps  Penicillins Hives   Did it involve swelling of the face/tongue/throat, SOB, or low BP? No Did it involve sudden or severe rash/hives, skin peeling, or any reaction on the inside of your mouth or nose? Yes Did you need to seek medical attention at a hospital or doctor's office? Yes When did it last happen?    Several Years Ago   If all above answers are "NO", may proceed with cephalosporin use.          Medication List     STOP taking these medications    levofloxacin 500 MG tablet Commonly known as: LEVAQUIN   nicotine 14 mg/24hr patch Commonly known as: NICODERM CQ - dosed in mg/24 hours   SM Nicotine Polacrilex 4 MG lozenge Generic drug: nicotine polacrilex       TAKE these medications     albuterol 108 (90 Base) MCG/ACT inhaler Commonly known as: VENTOLIN HFA Inhale 2 puffs into the lungs every 6 (six) hours as needed for wheezing or shortness of breath.   amLODipine 10 MG tablet Commonly known as: NORVASC Take 1 tablet (10 mg total) by mouth daily.   atorvastatin 20 MG tablet Commonly known as: LIPITOR Take 1 tablet (20 mg total) by mouth daily.   azithromycin 500 MG tablet Commonly known as: ZITHROMAX Take 1 tablet (500 mg total) by mouth daily for 4 days.   cefpodoxime 200 MG tablet Commonly known as: VANTIN Take 1 tablet (200 mg total) by mouth 2 (two) times daily for 4 days.   cetirizine 10 MG tablet Commonly known as: ZYRTEC Take 1 tablet (10 mg total) by mouth daily. What changed:  when to take this reasons to take this   cholecalciferol 25 MCG (1000 UT) tablet Generic drug: Cholecalciferol Take 1,000 Units by mouth daily.   fluticasone 50 MCG/ACT nasal spray Commonly known as: FLONASE Place 2 sprays into both nostrils daily. What changed:  when to take this reasons to take this   naproxen 500 MG tablet Commonly known as: NAPROSYN Take 1 tablet (500 mg total) by mouth 2 times daily with meals for 30 days then reduce to 1 tablet (500 mg total) by mouth nightly. What changed:  how much to take how to take this when to take this reasons to take this   predniSONE 20 MG tablet Commonly known as: DELTASONE Take 2 tablets (40 mg total) by mouth daily with breakfast for 5 days.        Allergies  Allergen Reactions   Aspirin     Stomach cramps   Penicillins Hives    Did it involve swelling of the face/tongue/throat, SOB, or low BP? No Did it involve sudden or severe rash/hives, skin peeling, or any reaction on the inside of your mouth or nose? Yes Did you need to seek medical attention at a hospital or doctor's office? Yes When did it last happen?    Several Years Ago   If all above answers are "NO", may proceed with cephalosporin  use.      Consultations:   Procedures:   Discharge Exam: BP (!) 146/77 (BP Location: Right Arm)   Pulse 78   Temp 97.8 F (36.6 C) (Oral)   Resp 18   Ht 5\' 7"  (1.702 m)   Wt 64.4 kg   SpO2 100%   BMI 22.24 kg/m  Physical Exam Constitutional:      General: She is not in acute distress.    Appearance: She is well-developed. She is not ill-appearing.  HENT:     Head: Normocephalic and atraumatic.     Mouth/Throat:     Mouth: Mucous membranes are moist.  Eyes:     Extraocular Movements: Extraocular movements intact.  Cardiovascular:     Rate and Rhythm: Normal rate and regular rhythm.  Pulmonary:     Effort: Pulmonary effort is normal. No respiratory distress.     Breath sounds: Rhonchi present. No wheezing.  Abdominal:     General: Bowel sounds are normal. There is no distension.     Palpations: Abdomen is soft.     Tenderness: There is no abdominal tenderness.  Musculoskeletal:        General: No swelling. Normal range of motion.     Cervical back: Normal range of motion and neck supple.  Skin:    General: Skin is warm and dry.  Neurological:     General: No focal deficit present.     Mental Status: She is alert and oriented to person, place, and time.  Psychiatric:        Mood and Affect: Mood normal.        Behavior: Behavior normal.      The results of significant diagnostics from this hospitalization (including imaging, microbiology, ancillary and laboratory) are listed below for reference.   Microbiology: Recent Results (from the past 240 hour(s))  Resp panel by RT-PCR (RSV, Flu A&B, Covid) Anterior Nasal Swab     Status: None   Collection Time: 09/14/22  4:45 PM   Specimen: Anterior Nasal Swab  Result Value Ref Range Status   SARS Coronavirus 2 by RT PCR NEGATIVE NEGATIVE Final    Comment: (NOTE) SARS-CoV-2 target nucleic acids are NOT DETECTED.  The SARS-CoV-2 RNA is generally detectable in upper respiratory specimens during the acute phase of  infection. The lowest concentration of SARS-CoV-2 viral copies this assay can detect is 138 copies/mL. A negative result does not preclude SARS-Cov-2 infection and should not be used as the sole basis for treatment or other patient management decisions. A negative result may occur with  improper specimen collection/handling, submission of specimen other than nasopharyngeal swab, presence of viral mutation(s) within the areas targeted by this assay, and inadequate number of viral copies(<138 copies/mL). A negative result must be combined with clinical observations, patient history, and epidemiological information. The expected result is Negative.  Fact Sheet for Patients:  EntrepreneurPulse.com.au  Fact Sheet for Healthcare Providers:  IncredibleEmployment.be  This test is no t yet approved or cleared by the [place] FDA and  has been authorized for detection and/or diagnosis of SARS-CoV-2 by FDA under an Emergency Use Authorization (EUA). This EUA will remain  in effect (meaning this test can be used) for the duration of the COVID-19 declaration under Section 564(b)(1) of the Act, 21 U.S.C.section 360bbb-3(b)(1), unless the authorization is terminated  or revoked sooner.       Influenza A by PCR NEGATIVE NEGATIVE Final   Influenza B by PCR NEGATIVE NEGATIVE Final    Comment: (NOTE) The Xpert Xpress SARS-CoV-2/FLU/RSV plus assay is intended as an aid in the diagnosis of influenza from Nasopharyngeal swab specimens and should not be used as a sole basis for treatment. Nasal washings and aspirates are unacceptable for Xpert Xpress SARS-CoV-2/FLU/RSV testing.  Fact Sheet for Patients: EntrepreneurPulse.com.au  Fact Sheet for Healthcare Providers: IncredibleEmployment.be  This test is not yet approved or cleared by the [place] FDA and has been authorized for detection and/or diagnosis of SARS-CoV-2  by FDA under an Emergency  Use Authorization (EUA). This EUA will remain in effect (meaning this test can be used) for the duration of the COVID-19 declaration under Section 564(b)(1) of the Act, 21 U.S.C. section 360bbb-3(b)(1), unless the authorization is terminated or revoked.     Resp Syncytial Virus by PCR NEGATIVE NEGATIVE Final    Comment: (NOTE) Fact Sheet for Patients: EntrepreneurPulse.com.au  Fact Sheet for Healthcare Providers: IncredibleEmployment.be  This test is not yet approved or cleared by the [place] FDA and has been authorized for detection and/or diagnosis of SARS-CoV-2 by FDA under an Emergency Use Authorization (EUA). This EUA will remain in effect (meaning this test can be used) for the duration of the COVID-19 declaration under Section 564(b)(1) of the Act, 21 U.S.C. section 360bbb-3(b)(1), unless the authorization is terminated or revoked.  Performed at [facility], [place] [address], [place], [place] [address]   Respiratory (~20 pathogens) panel by PCR     Status: Abnormal   Collection Time: 09/16/22  4:23 AM   Specimen: Urine, Clean Catch; Respiratory  Result Value Ref Range Status   Adenovirus NOT DETECTED NOT DETECTED Final   Coronavirus 229E NOT DETECTED NOT DETECTED Final    Comment: (NOTE) The Coronavirus on the Respiratory Panel, DOES NOT test for the novel  Coronavirus (2019 nCoV)    Coronavirus HKU1 NOT DETECTED NOT DETECTED Final   Coronavirus NL63 NOT DETECTED NOT DETECTED Final   Coronavirus OC43 NOT DETECTED NOT DETECTED Final   Metapneumovirus DETECTED (A) NOT DETECTED Final   Rhinovirus / Enterovirus NOT DETECTED NOT DETECTED Final   Influenza A NOT DETECTED NOT DETECTED Final   Influenza B NOT DETECTED NOT DETECTED Final   Parainfluenza Virus 1 NOT DETECTED NOT DETECTED Final   Parainfluenza Virus 2 NOT DETECTED NOT DETECTED Final   Parainfluenza Virus 3 NOT DETECTED NOT  DETECTED Final   Parainfluenza Virus 4 NOT DETECTED NOT DETECTED Final   Respiratory Syncytial Virus NOT DETECTED NOT DETECTED Final   Bordetella pertussis NOT DETECTED NOT DETECTED Final   Bordetella Parapertussis NOT DETECTED NOT DETECTED Final   Chlamydophila pneumoniae NOT DETECTED NOT DETECTED Final   Mycoplasma pneumoniae NOT DETECTED NOT DETECTED Final    Comment: Performed at [facility], [place] [address], [place],  [address]     Labs: BNP (last 3 results) No results for input(s): "BNP" in the last 8760 hours. Basic Metabolic Panel: Recent Labs  Lab 09/14/22 1645 09/16/22 1507 09/17/22 0406  NA 136 136 139  K 3.6 3.5 3.9  CL 103 105 108  CO2 24 22 22   GLUCOSE 113* 131* 149*  BUN 15 13 12   CREATININE 0.91 0.89 0.84  CALCIUM 8.9 8.3* 8.6*  MG  --   --  2.2   Liver Function Tests: Recent Labs  Lab 09/14/22 1645 09/16/22 1507  AST 25 34  ALT 17 19  ALKPHOS 73 59  BILITOT 0.8 0.4  PROT 7.8 6.9  ALBUMIN 4.2 3.5   No results for input(s): "LIPASE", "AMYLASE" in the last 168 hours. No results for input(s): "AMMONIA" in the last 168 hours. CBC: Recent Labs  Lab 09/14/22 1645 09/16/22 1507 09/17/22 0406  WBC 5.3 4.7 3.3*  NEUTROABS 3.9 2.6 1.9  HGB 14.7 13.6 13.3  HCT 45.9 43.4 41.3  MCV 83.3 83.0 81.9  PLT 205 185 201   Cardiac Enzymes: No results for input(s): "CKTOTAL", "CKMB", "CKMBINDEX", "TROPONINI" in the last 168 hours. BNP: Invalid input(s): "POCBNP" CBG: No results for input(s): "GLUCAP"  in the last 168 hours. D-Dimer Recent Labs    09/16/22 1507  DDIMER 0.62*   Hgb A1c No results for input(s): "HGBA1C" in the last 72 hours. Lipid Profile No results for input(s): "CHOL", "HDL", "LDLCALC", "TRIG", "CHOLHDL", "LDLDIRECT" in the last 72 hours. Thyroid function studies No results for input(s): "TSH", "T4TOTAL", "T3FREE", "THYROIDAB" in the last 72 hours.  Invalid input(s): "FREET3" Anemia work up No results for input(s):  "VITAMINB12", "FOLATE", "FERRITIN", "TIBC", "IRON", "RETICCTPCT" in the last 72 hours. Urinalysis    Component Value Date/Time   COLORURINE YELLOW 09/14/2022 1621   APPEARANCEUR HAZY (A) 09/14/2022 1621   LABSPEC 1.019 09/14/2022 1621   LABSPEC 1.020 07/19/2014 1516   PHURINE 5.0 09/14/2022 1621   GLUCOSEU NEGATIVE 09/14/2022 1621   GLUCOSEU Negative 07/19/2014 1516   HGBUR SMALL (A) 09/14/2022 1621   BILIRUBINUR NEGATIVE 09/14/2022 1621   BILIRUBINUR Negative 07/19/2014 1516   KETONESUR NEGATIVE 09/14/2022 1621   PROTEINUR 30 (A) 09/14/2022 1621   UROBILINOGEN 0.2 07/19/2014 1516   NITRITE NEGATIVE 09/14/2022 1621   LEUKOCYTESUR NEGATIVE 09/14/2022 1621   LEUKOCYTESUR Negative 07/19/2014 1516   Sepsis Labs Recent Labs  Lab 09/14/22 1645 09/16/22 1507 09/17/22 0406  WBC 5.3 4.7 3.3*   Microbiology Recent Results (from the past 240 hour(s))  Resp panel by RT-PCR (RSV, Flu A&B, Covid) Anterior Nasal Swab     Status: None   Collection Time: 09/14/22  4:45 PM   Specimen: Anterior Nasal Swab  Result Value Ref Range Status   SARS Coronavirus 2 by RT PCR NEGATIVE NEGATIVE Final    Comment: (NOTE) SARS-CoV-2 target nucleic acids are NOT DETECTED.  The SARS-CoV-2 RNA is generally detectable in upper respiratory specimens during the acute phase of infection. The lowest concentration of SARS-CoV-2 viral copies this assay can detect is 138 copies/mL. A negative result does not preclude SARS-Cov-2 infection and should not be used as the sole basis for treatment or other patient management decisions. A negative result may occur with  improper specimen collection/handling, submission of specimen other than nasopharyngeal swab, presence of viral mutation(s) within the areas targeted by this assay, and inadequate number of viral copies(<138 copies/mL). A negative result must be combined with clinical observations, patient history, and epidemiological information. The expected result  is Negative.  Fact Sheet for Patients:  EntrepreneurPulse.com.au  Fact Sheet for Healthcare Providers:  IncredibleEmployment.be  This test is no t yet approved or cleared by the [place] FDA and  has been authorized for detection and/or diagnosis of SARS-CoV-2 by FDA under an Emergency Use Authorization (EUA). This EUA will remain  in effect (meaning this test can be used) for the duration of the COVID-19 declaration under Section 564(b)(1) of the Act, 21 U.S.C.section 360bbb-3(b)(1), unless the authorization is terminated  or revoked sooner.       Influenza A by PCR NEGATIVE NEGATIVE Final   Influenza B by PCR NEGATIVE NEGATIVE Final    Comment: (NOTE) The Xpert Xpress SARS-CoV-2/FLU/RSV plus assay is intended as an aid in the diagnosis of influenza from Nasopharyngeal swab specimens and should not be used as a sole basis for treatment. Nasal washings and aspirates are unacceptable for Xpert Xpress SARS-CoV-2/FLU/RSV testing.  Fact Sheet for Patients: EntrepreneurPulse.com.au  Fact Sheet for Healthcare Providers: IncredibleEmployment.be  This test is not yet approved or cleared by the [place] FDA and has been authorized for detection and/or diagnosis of SARS-CoV-2 by FDA under an Emergency Use Authorization (EUA). This EUA will remain in effect (meaning  this test can be used) for the duration of the COVID-19 declaration under Section 564(b)(1) of the Act, 21 U.S.C. section 360bbb-3(b)(1), unless the authorization is terminated or revoked.     Resp Syncytial Virus by PCR NEGATIVE NEGATIVE Final    Comment: (NOTE) Fact Sheet for Patients: EntrepreneurPulse.com.au  Fact Sheet for Healthcare Providers: IncredibleEmployment.be  This test is not yet approved or cleared by the [place] FDA and has been authorized for detection and/or diagnosis of  SARS-CoV-2 by FDA under an Emergency Use Authorization (EUA). This EUA will remain in effect (meaning this test can be used) for the duration of the COVID-19 declaration under Section 564(b)(1) of the Act, 21 U.S.C. section 360bbb-3(b)(1), unless the authorization is terminated or revoked.  Performed at [facility], [place] [address] [place], [place] [address]   Respiratory (~20 pathogens) panel by PCR     Status: Abnormal   Collection Time: 09/16/22  4:23 AM   Specimen: Urine, Clean Catch; Respiratory  Result Value Ref Range Status   Adenovirus NOT DETECTED NOT DETECTED Final   Coronavirus 229E NOT DETECTED NOT DETECTED Final    Comment: (NOTE) The Coronavirus on the Respiratory Panel, DOES NOT test for the novel  Coronavirus (2019 nCoV)    Coronavirus HKU1 NOT DETECTED NOT DETECTED Final   Coronavirus NL63 NOT DETECTED NOT DETECTED Final   Coronavirus OC43 NOT DETECTED NOT DETECTED Final   Metapneumovirus DETECTED (A) NOT DETECTED Final   Rhinovirus / Enterovirus NOT DETECTED NOT DETECTED Final   Influenza A NOT DETECTED NOT DETECTED Final   Influenza B NOT DETECTED NOT DETECTED Final   Parainfluenza Virus 1 NOT DETECTED NOT DETECTED Final   Parainfluenza Virus 2 NOT DETECTED NOT DETECTED Final   Parainfluenza Virus 3 NOT DETECTED NOT DETECTED Final   Parainfluenza Virus 4 NOT DETECTED NOT DETECTED Final   Respiratory Syncytial Virus NOT DETECTED NOT DETECTED Final   Bordetella pertussis NOT DETECTED NOT DETECTED Final   Bordetella Parapertussis NOT DETECTED NOT DETECTED Final   Chlamydophila pneumoniae NOT DETECTED NOT DETECTED Final   Mycoplasma pneumoniae NOT DETECTED NOT DETECTED Final    Comment: Performed at [facility], [place] [address], [place], [place] [address]    Procedures/Studies: CT Angio Chest Pulmonary Embolism (PE) W or WO Contrast  Result Date: 09/16/2022 CLINICAL DATA:  Shortness of breath with hypoxia and pneumonia. EXAM: CT  ANGIOGRAPHY CHEST WITH CONTRAST TECHNIQUE: Multidetector CT imaging of the chest was performed using the standard protocol during bolus administration of intravenous contrast. Multiplanar CT image reconstructions and MIPs were obtained to evaluate the vascular anatomy. RADIATION DOSE REDUCTION: This exam was performed according to the departmental dose-optimization program which includes automated exposure control, adjustment of the mA and/or kV according to patient size and/or use of iterative reconstruction technique. CONTRAST:  69mL OMNIPAQUE IOHEXOL 350 MG/ML SOLN COMPARISON:  Lung cancer screening chest CT 08/30/2022 FINDINGS: Cardiovascular: The heart size is normal. No substantial pericardial effusion. Mild atherosclerotic calcification is noted in the wall of the thoracic aorta. There is no filling defect within the opacified pulmonary arteries to suggest the presence of an acute pulmonary embolus. Mediastinum/Nodes: No mediastinal lymphadenopathy. Prominent lymphoid tissue is noted in both hilar regions. 15 mm short axis right hilar node visible on 41/4. 11 mm short axis left hilar node visible on 52/4. The esophagus has normal imaging features. There is no axillary lymphadenopathy. Lungs/Pleura: Centrilobular and paraseptal emphysema evident. In the short interval since the prior CT, the patient  has developed interstitial and patchy airspace disease in the posterior right upper lobe, central right lower lobe, and posterior left upper lobe areas of associated bronchial wall thickening and tree-in-bud nodularity evident. 6 mm ill-defined nodular opacity in the left lower lobe on 98/10 is new in the interval. No pleural effusion. Upper Abdomen: Stable bilateral adrenal thickening visualized upper abdomen otherwise unremarkable. Musculoskeletal: No worrisome lytic or sclerotic osseous abnormality. Review of the MIP images confirms the above findings. IMPRESSION: 1. No CT evidence for acute pulmonary embolus.  2. Interval development of interstitial and patchy non confluent airspace disease in the posterior right upper lobe, central right lower lobe, and posterior left upper lobe with areas of associated bronchial wall thickening and tree-in-bud nodularity. Imaging features are compatible with multifocal infectious/inflammatory etiology and atypical etiology would be a distinct consideration. 3. 6 mm ill-defined nodular opacity in the left lower lobe is new in the interval. This is likely infectious/inflammatory. Non-contrast chest CT at 3-6 months is recommended. If the nodules are stable at time of repeat CT, then future CT at 18-24 months (from today's scan) is considered optional for low-risk patients, but is recommended for high-risk patients. This recommendation follows the consensus statement: Guidelines for Management of Incidental Pulmonary Nodules Detected on CT Images: From the Fleischner Society 2017; Radiology 2017; 284:228-243. 4. Mild bilateral hilar lymphadenopathy, likely reactive. 5. Aortic Atherosclerosis (ICD10-I70.0) and Emphysema (ICD10-J43.9). Electronically Signed   By: Misty Stanley M.D.   On: 09/16/2022 18:54   DG Chest Port 1 View  Result Date: 09/16/2022 CLINICAL DATA:  Shortness of breath EXAM: PORTABLE CHEST 1 VIEW COMPARISON:  09/14/2022 FINDINGS: The heart size and mediastinal contours are within normal limits. Similar appearance of subtle heterogeneous airspace opacity in the perihilar right lung and right lung base. Underlying emphysema. The visualized skeletal structures are unremarkable. IMPRESSION: 1. Similar appearance of subtle heterogeneous airspace opacity in the perihilar right lung and right lung base, concerning for infection or aspiration. 2. Underlying emphysema. Electronically Signed   By: Delanna Ahmadi M.D.   On: 09/16/2022 15:55   DG Chest 2 View  Result Date: 09/14/2022 CLINICAL DATA:  Shortness of breath. EXAM: CHEST - 2 VIEW COMPARISON:  None Available. FINDINGS:  Lungs are hyperexpanded. Patchy airspace disease seen in the parahilar right lung and right lung base. Interstitial markings are diffusely coarsened with chronic features. The cardiopericardial silhouette is within normal limits for size. The visualized bony structures of the thorax are unremarkable. Telemetry leads overlie the chest. IMPRESSION: Subtle patchy airspace opacity in the parahilar right lung and right lung base. Imaging features suspicious for pneumonia. Electronically Signed   By: Misty Stanley M.D.   On: 09/14/2022 16:47   CT CHEST LUNG CANCER SCREENING LOW DOSE WO CONTRAST  Result Date: 08/31/2022 CLINICAL DATA:  67 year old female current, with 30 pack-year history of smoking, for initial lung cancer screening. History of cervical cancer. EXAM: CT CHEST WITHOUT CONTRAST LOW-DOSE FOR LUNG CANCER SCREENING TECHNIQUE: Multidetector CT imaging of the chest was performed following the standard protocol without IV contrast. RADIATION DOSE REDUCTION: This exam was performed according to the departmental dose-optimization program which includes automated exposure control, adjustment of the mA and/or kV according to patient size and/or use of iterative reconstruction technique. COMPARISON:  CT chest dated 09/12/2016 FINDINGS: Cardiovascular: Heart is normal in size.  No pericardial effusion. No evidence of thoracic aortic aneurysm. Atherosclerotic calcifications of the aortic root/arch. Mediastinum/Nodes: No suspicious mediastinal lymphadenopathy. Visualized thyroid is unremarkable. Lungs/Pleura: Moderate centrilobular and paraseptal  emphysematous changes, upper lung predominant. No focal consolidation. Small bilateral pulmonary nodules measuring up to 3.5 mm. No pleural effusion or pneumothorax. Upper Abdomen: Visualized upper abdomen is grossly unremarkable, noting vascular calcifications. Musculoskeletal: Visualized osseous structures are within normal limits. IMPRESSION: Lung-RADS 2, benign appearance  or behavior. Continue annual screening with low-dose chest CT without contrast in 12 months. Aortic Atherosclerosis (ICD10-I70.0) and Emphysema (ICD10-J43.9). Electronically Signed   By: Julian Hy M.D.   On: 08/31/2022 22:23     Time coordinating discharge: Over 30 minutes    Dwyane Dee, MD  [facility] 09/17/2022, 2:00 PM

## 2022-09-17 NOTE — Progress Notes (Signed)
SATURATION QUALIFICATIONS: (This note is used to comply with regulatory documentation for home oxygen)  Patient Saturations on Room Air at Rest = 98%  Patient Saturations on Room Air while Ambulating = 89-96%  Please briefly explain why patient needs home oxygen:  does not qualify

## 2022-09-17 NOTE — Plan of Care (Signed)

## 2022-09-18 LAB — LEGIONELLA PNEUMOPHILA SEROGP 1 UR AG: L. pneumophila Serogp 1 Ur Ag: NEGATIVE

## 2022-09-19 ENCOUNTER — Telehealth: Payer: Self-pay

## 2022-09-19 NOTE — Transitions of Care (Post Inpatient/ED Visit) (Signed)
   09/19/2022  Name: Martha Koch MRN: [id number] DOB: 17-May-1956  Today's TOC FU Call Status: Today's TOC FU Call Status:: Unsuccessul Call (1st Attempt) Unsuccessful Call (1st Attempt) Date: 09/19/22  Attempted to reach the patient regarding the most recent Inpatient/ED visit.  Follow Up Plan: Additional outreach attempts will be made to reach the patient to complete the Transitions of Care (Post Inpatient/ED visit) call.     Enzo Montgomery, RN,BSN,CCM [facility] Care Management Care Management Community Coordinator Direct Phone: [phone] Toll Free: [phone] Fax: [phone]

## 2022-09-20 ENCOUNTER — Telehealth: Payer: Self-pay

## 2022-09-20 NOTE — Transitions of Care (Post Inpatient/ED Visit) (Signed)
   09/20/2022  Name: Martha Koch MRN: [id number] DOB: 05/29/1956  Today's TOC FU Call Status: Today's TOC FU Call Status:: Unsuccessful Call (2nd Attempt) Unsuccessful Call (2nd Attempt) Date: 09/20/22  Attempted to reach the patient regarding the most recent Inpatient/ED visit.  Follow Up Plan: Additional outreach attempts will be made to reach the patient to complete the Transitions of Care (Post Inpatient/ED visit) call.   Enzo Montgomery, RN,BSN,CCM [facility] Care Management Care Management Community Coordinator Direct Phone: [phone] Toll Free: [phone] Fax: [phone]

## 2022-09-20 NOTE — Transitions of Care (Post Inpatient/ED Visit) (Signed)
   09/20/2022  Name: Martha Koch MRN: [id number] DOB: 1956-04-24  Today's TOC FU Call Status: Today's TOC FU Call Status:: Unsuccessful Call (3rd Attempt) Unsuccessful Call (3rd Attempt) Date: 09/20/22  Attempted to reach the patient regarding the most recent Inpatient/ED visit.  Follow Up Plan: No further outreach attempts will be made at this time. We have been unable to contact the patient.    Enzo Montgomery, RN,BSN,CCM [facility] Care Management Care Management Community Coordinator Direct Phone: [phone] Toll Free: [phone] Fax: [phone]

## 2022-10-01 ENCOUNTER — Telehealth: Payer: Self-pay | Admitting: Family Medicine

## 2022-10-01 NOTE — Telephone Encounter (Signed)
Contacted Martha Koch to schedule their annual wellness visit. Appointment made for 10/03/22.  [employer] [employer] AW[facility] direct phone # [phone]

## 2022-10-03 ENCOUNTER — Ambulatory Visit: Payer: Medicare HMO | Attending: Family Medicine

## 2022-10-03 VITALS — Ht 67.0 in | Wt 145.0 lb

## 2022-10-03 DIAGNOSIS — Z0000 Encounter for general adult medical examination without abnormal findings: Secondary | ICD-10-CM

## 2022-10-03 NOTE — Patient Instructions (Signed)
Martha Koch , Thank you for taking time to come for your Medicare Wellness Visit. I appreciate your ongoing commitment to your health goals. Please review the following plan we discussed and let me know if I can assist you in the future.   These are the goals we discussed:  Goals      Patient Stated     10/03/2022, continue to not smoke and wants to start exercise        This is a list of the screening recommended for you and due dates:  Health Maintenance  Topic Date Due   COVID-19 Vaccine (1) Never done   DEXA scan (bone density measurement)  Never done   Zoster (Shingles) Vaccine (1 of 2) 10/31/2022*   Flu Shot  01/31/2023   Screening for Lung Cancer  09/16/2023   Medicare Annual Wellness Visit  10/03/2023   Mammogram  01/30/2024   DTaP/Tdap/Td vaccine (2 - Td or Tdap) 08/25/2025   Colon Cancer Screening  01/04/2030   Pneumonia Vaccine  Completed   Hepatitis C Screening: USPSTF Recommendation to screen - Ages 18-79 yo.  Completed   HPV Vaccine  Aged Out  *Topic was postponed. The date shown is not the original due date.    Advanced directives: Advance directive discussed with you today.   Conditions/risks identified: none  Next appointment: Follow up in one year for your annual wellness visit    Preventive Care 65 Years and Older, Female Preventive care refers to lifestyle choices and visits with your health care provider that can promote health and wellness. What does preventive care include? A yearly physical exam. This is also called an annual well check. Dental exams once or twice a year. Routine eye exams. Ask your health care provider how often you should have your eyes checked. Personal lifestyle choices, including: Daily care of your teeth and gums. Regular physical activity. Eating a healthy diet. Avoiding tobacco and drug use. Limiting alcohol use. Practicing safe sex. Taking low-dose aspirin every day. Taking vitamin and mineral supplements as  recommended by your health care provider. What happens during an annual well check? The services and screenings done by your health care provider during your annual well check will depend on your age, overall health, lifestyle risk factors, and family history of disease. Counseling  Your health care provider may ask you questions about your: Alcohol use. Tobacco use. Drug use. Emotional well-being. Home and relationship well-being. Sexual activity. Eating habits. History of falls. Memory and ability to understand (cognition). Work and work Statistician. Reproductive health. Screening  You may have the following tests or measurements: Height, weight, and BMI. Blood pressure. Lipid and cholesterol levels. These may be checked every 5 years, or more frequently if you are over 41 years old. Skin check. Lung cancer screening. You may have this screening every year starting at age 39 if you have a 30-pack-year history of smoking and currently smoke or have quit within the past 15 years. Fecal occult blood test (FOBT) of the stool. You may have this test every year starting at age 29. Flexible sigmoidoscopy or colonoscopy. You may have a sigmoidoscopy every 5 years or a colonoscopy every 10 years starting at age 19. Hepatitis C blood test. Hepatitis B blood test. Sexually transmitted disease (STD) testing. Diabetes screening. This is done by checking your blood sugar (glucose) after you have not eaten for a while (fasting). You may have this done every 1-3 years. Bone density scan. This is done to screen for  osteoporosis. You may have this done starting at age 33. Mammogram. This may be done every 1-2 years. Talk to your health care provider about how often you should have regular mammograms. Talk with your health care provider about your test results, treatment options, and if necessary, the need for more tests. Vaccines  Your health care provider may recommend certain vaccines, such  as: Influenza vaccine. This is recommended every year. Tetanus, diphtheria, and acellular pertussis (Tdap, Td) vaccine. You may need a Td booster every 10 years. Zoster vaccine. You may need this after age 54. Pneumococcal 13-valent conjugate (PCV13) vaccine. One dose is recommended after age 76. Pneumococcal polysaccharide (PPSV23) vaccine. One dose is recommended after age 68. Talk to your health care provider about which screenings and vaccines you need and how often you need them. This information is not intended to replace advice given to you by your health care provider. Make sure you discuss any questions you have with your health care provider. Document Released: 07/15/2015 Document Revised: 03/07/2016 Document Reviewed: 04/19/2015 Elsevier Interactive Patient Education  2017 [place] Prevention in the Home Falls can cause injuries. They can happen to people of all ages. There are many things you can do to make your home safe and to help prevent falls. What can I do on the outside of my home? Regularly fix the edges of walkways and driveways and fix any cracks. Remove anything that might make you trip as you walk through a door, such as a raised step or threshold. Trim any bushes or trees on the path to your home. Use bright outdoor lighting. Clear any walking paths of anything that might make someone trip, such as rocks or tools. Regularly check to see if handrails are loose or broken. Make sure that both sides of any steps have handrails. Any raised decks and porches should have guardrails on the edges. Have any leaves, snow, or ice cleared regularly. Use sand or salt on walking paths during winter. Clean up any spills in your garage right away. This includes oil or grease spills. What can I do in the bathroom? Use night lights. Install grab bars by the toilet and in the tub and shower. Do not use towel bars as grab bars. Use non-skid mats or decals in the tub or  shower. If you need to sit down in the shower, use a plastic, non-slip stool. Keep the floor dry. Clean up any water that spills on the floor as soon as it happens. Remove soap buildup in the tub or shower regularly. Attach bath mats securely with double-sided non-slip rug tape. Do not have throw rugs and other things on the floor that can make you trip. What can I do in the bedroom? Use night lights. Make sure that you have a light by your bed that is easy to reach. Do not use any sheets or blankets that are too big for your bed. They should not hang down onto the floor. Have a firm chair that has side arms. You can use this for support while you get dressed. Do not have throw rugs and other things on the floor that can make you trip. What can I do in the kitchen? Clean up any spills right away. Avoid walking on wet floors. Keep items that you use a lot in easy-to-reach places. If you need to reach something above you, use a strong step stool that has a grab bar. Keep electrical cords out of the way. Do not  use floor polish or wax that makes floors slippery. If you must use wax, use non-skid floor wax. Do not have throw rugs and other things on the floor that can make you trip. What can I do with my stairs? Do not leave any items on the stairs. Make sure that there are handrails on both sides of the stairs and use them. Fix handrails that are broken or loose. Make sure that handrails are as long as the stairways. Check any carpeting to make sure that it is firmly attached to the stairs. Fix any carpet that is loose or worn. Avoid having throw rugs at the top or bottom of the stairs. If you do have throw rugs, attach them to the floor with carpet tape. Make sure that you have a light switch at the top of the stairs and the bottom of the stairs. If you do not have them, ask someone to add them for you. What else can I do to help prevent falls? Wear shoes that: Do not have high heels. Have  rubber bottoms. Are comfortable and fit you well. Are closed at the toe. Do not wear sandals. If you use a stepladder: Make sure that it is fully opened. Do not climb a closed stepladder. Make sure that both sides of the stepladder are locked into place. Ask someone to hold it for you, if possible. Clearly mark and make sure that you can see: Any grab bars or handrails. First and last steps. Where the edge of each step is. Use tools that help you move around (mobility aids) if they are needed. These include: Canes. Walkers. Scooters. Crutches. Turn on the lights when you go into a dark area. Replace any light bulbs as soon as they burn out. Set up your furniture so you have a clear path. Avoid moving your furniture around. If any of your floors are uneven, fix them. If there are any pets around you, be aware of where they are. Review your medicines with your doctor. Some medicines can make you feel dizzy. This can increase your chance of falling. Ask your doctor what other things that you can do to help prevent falls. This information is not intended to replace advice given to you by your health care provider. Make sure you discuss any questions you have with your health care provider. Document Released: 04/14/2009 Document Revised: 11/24/2015 Document Reviewed: 07/23/2014 Elsevier Interactive Patient Education  2017 [employer].

## 2022-10-03 NOTE — Progress Notes (Unsigned)
Patient ID: Martha Koch, female   DOB: Mar 03, 1956, 67 y.o.   MRN: [id number]   After hospitalization 3/17-3/18/2024  From discharge summary: Admit date: 09/16/2022 Discharge date: 09/17/2022   Admitted From: Home Disposition:  Home Discharging physician: Dwyane Dee, MD Barriers to discharge: none   Recommendations for Outpatient Follow-up:  CTA chest noted 6 mm ill-defined nodular opacity in the LLL new since last CT.  Recommended for repeat CT chest in 3 to 6 months.  Likely this opacity is infectious/inflammatory but given history of nodules, will need follow-up  Hospital Course: Martha Koch is a 67 yo female with PMH emphysema, pulmonary nodules, HTN, HLD, cervical cancer s/p chemoradiation who presents to the ER with worsening shortness of breath. She was recently evaluated in the ER on 09/14/2022 for similar complaint.  She was noted to have worsening cough and had recently quit smoking.  CXR at that time had shown subtle patchy airspace opacities in the perihilar right lung and right lung base.  She was considered stable for trial of outpatient antibiotics and was discharged home with Levaquin.   She states that she did take Levaquin on Saturday and Sunday.  Her cough remained productive of yellow sputum and usually she has no baseline cough.  She also has had low blood pressure since Friday, 80s over 60s and typically runs normal blood pressures.  She even takes amlodipine for hypertension.  She did not take amlodipine on Saturday. She does state that after quitting smoking on Monday, 09/10/2022 she has been laying in bed essentially most days except to get up to eat or go to the bathroom.  Then, beginning on Friday, 09/14/2022 she began feeling short of breath with the onset of her symptoms.   In the ER, she remained hypotensive (80s/60s) and tachycardic 110s.  She was hypoxic on 2 L which is new in onset which prompted her to come to the ER. D-dimer was mildly elevated, 0.62.  CXR  was repeated which showed similar appearance of heterogenous airspace opacities seen on prior CXR from 09/14/2022. She underwent CTA chest to rule out PE which was negative.  It was notable for better evaluation which showed patchy airspace disease involving right upper and lower lobe as well as left upper lobe. New 6 mm left lower lobe opacity noted as well, suspected to be infectious/inflammatory but is recommended for repeat CT in 3-6 months.  She already routinely undergoes surveillance outpatient for pulmonary nodules.   RVP was also obtained on admission and was positive for metapneumovirus.  She was continued on steroids given her hypoxia.  She was able to ambulate in the hall on room air prior to discharge with lowest saturation noted at 89%.  She did not warrant oxygen and oxygenation did recover quickly with rest. She is prescribed cefpodoxime, azithromycin, and prednisone to complete courses at discharge.  Albuterol inhaler also supplied in case of any further shortness of breath or wheezing.   Assessment and Plan: * Acute hypoxemic respiratory failure (HCC)-resolved as of 09/17/2022 - Patient began developing SOB starting 09/14/2022.  She was found to have "subtle patchy airspace opacity in the perihilar right lung and right lung base".  She was started on Levaquin for suspected pneumonia; she has been compliant on Levaquin prior to admission although did have significant nausea at home -Given new hypoxia, hypotension, tachycardia, ongoing SOB with no significant improvement on Levaquin for 2 days, needing to consider other diagnoses which include differential of COPD exac vs PE vs treatment  failure (less likely) - CTA chest negative for PE -Oxygen weaned off prior to discharge with lowest saturation 89% on room air while ambulating   CAP (community acquired pneumonia) - Patient recently presented on 09/14/2022 with cough, SOB, and started on Levaquin for suspected pneumonia involving right  lung -Continued on antibiotics but transitioned to Rocephin and azithromycin while inpatient -Strep urinary antigen negative.  Legionella pending at discharge -COVID, flu, RSV negative on 09/14/2022 - RVP positive for metapneumovirus.  Steroid continued at discharge -CTA chest notable for better evaluation of pneumonia noting opacities in RUL, RLL, LUL -Transition antibiotics to cefpodoxime and azithromycin to complete course at discharge   Hypotension-resolved as of 09/17/2022 - Likely this is some contribution from amlodipine last taken on 09/15/2022 with superimposed decreased intake from feeling sick.  She does not appear toxic enough for this to be severe sepsis.  Other considered etiology would be large underlying PE although she is also not as dyspneic or hypoxic as would be expected -Resolved with fluids - Okay to resume blood pressure medications at discharge   Former tobacco use - Patient states she quit smoking cold [place] on 09/10/2022 - Prior use was 0.5 - 1 PPD x 30 years   COPD exacerbation ([place]) - Patient was reported to have wheezing by EMS and route and was given Solu-Medrol and DuoNeb -Does not have much wheezing on exam in the ER which may have responded well to treatment -Continue on albuterol inhaler and prednisone at discharge   Pulmonary nodules - Followed routinely outpatient.  Last CT chest performed 08/30/2022 noting stable bilateral pulmonary nodules measuring 3.5 mm.  Next scan was recommended for 12 months -CTA chest noted 6 mm ill-defined nodular opacity in the LLL new since last CT.  Recommended for repeat CT chest in 3 to 6 months.  Likely this opacity is infectious/inflammatory but given history of nodules, will need follow-up   Essential hypertension - Blood pressure returned to normal prior to discharge - Amlodipine resumed

## 2022-10-03 NOTE — Progress Notes (Signed)
I connected with  Martha Koch on 10/03/22 by a audio enabled telemedicine application and verified that I am speaking with the correct person using two identifiers.  Patient Location: Home  Provider Location: Office/Clinic  I discussed the limitations of evaluation and management by telemedicine. The patient expressed understanding and agreed to proceed.  Subjective:   Martha Koch is a 67 y.o. female who presents for an Initial Medicare Annual Wellness Visit.  Review of Systems     Cardiac Risk Factors include: advanced age (>36men, >80 women);dyslipidemia;hypertension     Objective:    Today's Vitals   10/03/22 1526  Weight: 145 lb (65.8 kg)  Height: 5\' 7"  (1.702 m)   Body mass index is 22.71 kg/m.     10/03/2022    3:30 PM 09/16/2022    7:46 PM 09/16/2022    3:40 PM 09/14/2022    4:15 PM 09/05/2021    3:10 PM 01/05/2019    2:54 PM 11/03/2018    7:34 AM  Advanced Directives  Does Patient Have a Medical Advance Directive? No No No No No No No  Would patient like information on creating a medical advance directive?  No - Patient declined   No - Patient declined  No - Guardian declined    Current Medications (verified) Outpatient Encounter Medications as of 10/03/2022  Medication Sig   albuterol (VENTOLIN HFA) 108 (90 Base) MCG/ACT inhaler Inhale 2 puffs into the lungs every 6 (six) hours as needed for wheezing or shortness of breath.   amLODipine (NORVASC) 10 MG tablet Take 1 tablet (10 mg total) by mouth daily.   atorvastatin (LIPITOR) 20 MG tablet Take 1 tablet (20 mg total) by mouth daily.   cetirizine (ZYRTEC) 10 MG tablet Take 1 tablet (10 mg total) by mouth daily. (Patient taking differently: Take 10 mg by mouth daily as needed for allergies.)   cholecalciferol 25 MCG (1000 UT) tablet Take 1,000 Units by mouth daily.   fluticasone (FLONASE) 50 MCG/ACT nasal spray Place 2 sprays into both nostrils daily. (Patient taking differently: Place 2 sprays into both  nostrils daily as needed for allergies.)   naproxen (NAPROSYN) 500 MG tablet Take 1 tablet (500 mg total) by mouth 2 times daily with meals for 30 days then reduce to 1 tablet (500 mg total) by mouth nightly. (Patient taking differently: Take 500 mg by mouth daily as needed for moderate pain.)   No facility-administered encounter medications on file as of 10/03/2022.    Allergies (verified) Aspirin and Penicillins   History: Past Medical History:  Diagnosis Date   Cervical cancer cervical   Hyperlipidemia    Hypertension    Radiation 06/02/14-07/13/14   pelvis 45 gray, vaginal cuff/parametrial boost to 50.4 gray   Radiation 04/06/15-05/13/15   anterior abdominal wall 50.4 gray   Past Surgical History:  Procedure Laterality Date   ABDOMINAL HYSTERECTOMY     COLONOSCOPY  2011   MUSCLE BIOPSY Right 09/14/2021   Procedure: MUSCLE BIOPSY RIGHT THIGH AND RIGHT LOWER LEG;  Surgeon: Stark Klein, MD;  Location: [facility];  Service: General;  Laterality: Right;   robotic radical hysterectomy, bso, pelvic lymphadenectomy Bilateral 04/21/14   TUBAL LIGATION     Family History  Problem Relation Age of Onset   Hypertension Father    Breast cancer Maternal Aunt        breast cancer, x 4 aunts    Breast cancer Daughter    Cancer Other    Diabetes Other  Colon cancer Neg Hx    Colon polyps Neg Hx    Esophageal cancer Neg Hx    Stomach cancer Neg Hx    Rectal cancer Neg Hx    Neuropathy Neg Hx    Social History   Socioeconomic History   Marital status: Single    Spouse name: Not on file   Number of children: 3   Years of education: 12   Highest education level: Not on file  Occupational History   Occupation: [employer]     Employer: [employer]  Tobacco Use   Smoking status: Former    Packs/day: 0.50    Years: 30.00    Additional pack years: 0.00    Total pack years: 15.00    Types: Cigarettes    Quit date: 09/12/2022    Years since quitting: 0.0   Smokeless tobacco:  Never   Tobacco comments:    smokes 2-3 a day  Vaping Use   Vaping Use: Never used  Substance and Sexual Activity   Alcohol use: No    Alcohol/week: 0.0 standard drinks of alcohol   Drug use: No   Sexual activity: Not Currently  Other Topics Concern   Not on file  Social History Narrative   Has 3 children. They live in [place].   Has 3 grandchildren.    Martha Koch (oldest daughter)    Lives alone.    Social Determinants of Health   Financial Resource Strain: Low Risk  (10/03/2022)   Overall Financial Resource Strain (CARDIA)    Difficulty of Paying Living Expenses: Not hard at all  Food Insecurity: No Food Insecurity (10/03/2022)   Hunger Vital Sign    Worried About Running Out of Food in the Last Year: Never true    Ran Out of Food in the Last Year: Never true  Transportation Needs: No Transportation Needs (10/03/2022)   PRAPARE - Hydrologist (Medical): No    Lack of Transportation (Non-Medical): No  Physical Activity: Inactive (10/03/2022)   Exercise Vital Sign    Days of Exercise per Week: 0 days    Minutes of Exercise per Session: 0 min  Stress: No Stress Concern Present (10/03/2022)   Martha Koch    Feeling of Stress : Not at all  Social Connections: Not on file    Tobacco Counseling Counseling given: Not Answered Tobacco comments: smokes 2-3 a day   Clinical Intake:  Pre-visit preparation completed: Yes  Pain : No/denies pain     Nutritional Status: BMI of 19-24  Normal Nutritional Risks: None Diabetes: No  How often do you need to have someone help you when you read instructions, pamphlets, or other written materials from your doctor or pharmacy?: 1 - Never  Diabetic? no  Interpreter Needed?: No  Information entered by :: NAllen LPN   Activities of Daily Living    10/03/2022    3:30 PM 09/16/2022    7:46 PM  In your present state of health, do you have  any difficulty performing the following activities:  Hearing? 0 0  Vision? 0 0  Difficulty concentrating or making decisions? 0 0  Walking or climbing stairs? 1 1  Dressing or bathing? 0 0  Doing errands, shopping? 0 0  Preparing Food and eating ? N   Using the Toilet? N   In the past six months, have you accidently leaked urine? N   Do you have problems with  loss of bowel control? N   Managing your Medications? N   Managing your Finances? N   Housekeeping or managing your Housekeeping? N     Patient Care Team: Charlott Rakes, MD as PCP - General (Family Medicine)  Indicate any recent Medical Services you may have received from other than [facility] providers in the past year (date may be approximate).     Assessment:   This is a routine wellness examination for [place].  Hearing/Vision screen Vision Screening - Comments:: No regular eye exams  Dietary issues and exercise activities discussed: Current Exercise Habits: The patient does not participate in regular exercise at present   Goals Addressed             This Visit's Progress    Patient Stated       10/03/2022, continue to not smoke and wants to start exercise       Depression Screen    10/03/2022    3:30 PM 08/02/2022    9:47 AM 05/20/2020    8:52 AM 11/20/2019    9:33 AM 04/21/2018    1:50 PM 12/26/2017    8:16 AM 06/20/2017    8:44 AM  PHQ 2/9 Scores  PHQ - 2 Score 0 0 0 0 0 0 0  PHQ- 9 Score  0 0 0 0      Fall Risk    10/03/2022    3:30 PM 08/02/2022    9:36 AM 05/20/2020    8:47 AM 11/20/2019    9:33 AM 12/26/2017    8:16 AM  Fall Risk   Falls in the past year? 0 0 0 0 No  Number falls in past yr: 0 0 0 0   Injury with Fall? 0 0 0 0   Risk for fall due to : Medication side effect      Follow up Falls prevention discussed;Education provided;Falls evaluation completed   Falls evaluation completed     FALL RISK PREVENTION PERTAINING TO THE HOME:  Any stairs in or around the home? No  If so, are there  any without handrails? N/a Home free of loose throw rugs in walkways, pet beds, electrical cords, etc? Yes  Adequate lighting in your home to reduce risk of falls? Yes   ASSISTIVE DEVICES UTILIZED TO PREVENT FALLS:  Life alert? No  Use of a cane, walker or w/c? No  Grab bars in the bathroom? No  Shower chair or bench in shower? No  Elevated toilet seat or a handicapped toilet? Yes   TIMED UP AND GO:  Was the test performed? No .      Cognitive Function:        10/03/2022    3:32 PM  6CIT Screen  What Year? 0 points  What month? 0 points  What time? 0 points  Count back from 20 0 points  Months in reverse 0 points  Repeat phrase 2 points  Total Score 2 points    Immunizations Immunization History  Administered Date(s) Administered   Fluad Quad(high Dose 65+) 08/08/2022   Influenza,inj,Quad PF,6+ Mos 03/02/2014, 04/02/2016   Influenza-Unspecified 03/02/2017, 04/29/2020   PNEUMOCOCCAL CONJUGATE-20 08/08/2022   PPD Test 08/27/2018   Tdap 08/26/2015    TDAP status: Up to date  Flu Vaccine status: Up to date  Pneumococcal vaccine status: Up to date  Covid-19 vaccine status: Completed vaccines  Qualifies for Shingles Vaccine? Yes   Zostavax completed No   Shingrix Completed?: No.    Education has been provided  regarding the importance of this vaccine. Patient has been advised to call insurance company to determine out of pocket expense if they have not yet received this vaccine. Advised may also receive vaccine at local pharmacy or Health Dept. Verbalized acceptance and understanding.  Screening Tests Health Maintenance  Topic Date Due   Medicare Annual Wellness (AWV)  Never done   COVID-19 Vaccine (1) Never done   DEXA SCAN  Never done   Zoster Vaccines- Shingrix (1 of 2) 10/31/2022 (Originally 02/28/1975)   INFLUENZA VACCINE  01/31/2023   Lung Cancer Screening  09/16/2023   MAMMOGRAM  01/30/2024   DTaP/Tdap/Td (2 - Td or Tdap) 08/25/2025   COLONOSCOPY (Pts  45-61yrs Insurance coverage will need to be confirmed)  01/04/2030   Pneumonia Vaccine 58+ Years old  Completed   Hepatitis C Screening  Completed   HPV VACCINES  Aged Out    Health Maintenance  Health Maintenance Due  Topic Date Due   Medicare Annual Wellness (AWV)  Never done   COVID-19 Vaccine (1) Never done   DEXA SCAN  Never done    Colorectal cancer screening: Type of screening: Colonoscopy. Completed 01/05/2020. Repeat every 10 years  Mammogram status: Completed 01/29/2022. Repeat every year  Bone Density status: scheduled for 01/09/2023  Lung Cancer Screening: (Low Dose CT Chest recommended if Age 63-80 years, 30 pack-year currently smoking OR have quit w/in 15years.) does qualify.   Lung Cancer Screening Referral:  CT scan 09/16/2022  Additional Screening:  Hepatitis C Screening: does qualify; Completed 08/26/2015  Vision Screening: Recommended annual ophthalmology exams for early detection of glaucoma and other disorders of the eye. Is the patient up to date with their annual eye exam?  No  Who is the provider or what is the name of the office in which the patient attends annual eye exams? none If pt is not established with a provider, would they like to be referred to a provider to establish care? No .   Dental Screening: Recommended annual dental exams for proper oral hygiene  Community Resource Referral / Chronic Care Management: CRR required this visit?  No   CCM required this visit?  No      Plan:     I have personally reviewed and noted the following in the patient's chart:   Medical and social history Use of alcohol, tobacco or illicit drugs  Current medications and supplements including opioid prescriptions. Patient is not currently taking opioid prescriptions. Functional ability and status Nutritional status Physical activity Advanced directives List of other physicians Hospitalizations, surgeries, and ER visits in previous 12  months Vitals Screenings to include cognitive, depression, and falls Referrals and appointments  In addition, I have reviewed and discussed with patient certain preventive protocols, quality metrics, and best practice recommendations. A written personalized care plan for preventive services as well as general preventive health recommendations were provided to patient.     Kellie Simmering, LPN   [id number]   Nurse Notes: none  Due to this being a virtual visit, the after visit summary with patients personalized plan was offered to patient via mail or my-chart.  Patient would like to access on my-chart

## 2022-10-04 ENCOUNTER — Ambulatory Visit: Payer: Medicare HMO | Attending: Physician Assistant | Admitting: Physician Assistant

## 2022-10-04 ENCOUNTER — Encounter: Payer: Self-pay | Admitting: Physician Assistant

## 2022-10-04 VITALS — BP 138/78 | HR 85 | Ht 67.0 in | Wt 149.8 lb

## 2022-10-04 DIAGNOSIS — R918 Other nonspecific abnormal finding of lung field: Secondary | ICD-10-CM

## 2022-10-04 DIAGNOSIS — R7303 Prediabetes: Secondary | ICD-10-CM | POA: Diagnosis not present

## 2022-10-04 DIAGNOSIS — E785 Hyperlipidemia, unspecified: Secondary | ICD-10-CM

## 2022-10-04 DIAGNOSIS — J9691 Respiratory failure, unspecified with hypoxia: Secondary | ICD-10-CM

## 2022-10-04 DIAGNOSIS — I10 Essential (primary) hypertension: Secondary | ICD-10-CM | POA: Diagnosis not present

## 2022-10-04 DIAGNOSIS — J3489 Other specified disorders of nose and nasal sinuses: Secondary | ICD-10-CM | POA: Diagnosis not present

## 2022-10-04 DIAGNOSIS — J189 Pneumonia, unspecified organism: Secondary | ICD-10-CM

## 2022-10-04 DIAGNOSIS — Z09 Encounter for follow-up examination after completed treatment for conditions other than malignant neoplasm: Secondary | ICD-10-CM | POA: Diagnosis not present

## 2022-10-04 DIAGNOSIS — Z87891 Personal history of nicotine dependence: Secondary | ICD-10-CM

## 2022-10-04 MED ORDER — ALBUTEROL SULFATE HFA 108 (90 BASE) MCG/ACT IN AERS
2.0000 | INHALATION_SPRAY | Freq: Four times a day (QID) | RESPIRATORY_TRACT | 2 refills | Status: DC | PRN
  Filled 2022-10-05 – 2022-10-22 (×3): qty 18, 25d supply, fill #0
  Filled 2022-12-26: qty 18, 25d supply, fill #1
  Filled 2023-03-27: qty 18, 25d supply, fill #2

## 2022-10-04 MED ORDER — ATORVASTATIN CALCIUM 20 MG PO TABS
20.0000 mg | ORAL_TABLET | Freq: Every day | ORAL | 1 refills | Status: DC
  Filled 2022-10-05: qty 90, 90d supply, fill #0

## 2022-10-04 MED ORDER — AMLODIPINE BESYLATE 10 MG PO TABS
10.0000 mg | ORAL_TABLET | Freq: Every day | ORAL | 1 refills | Status: DC
  Filled 2022-10-05 – 2022-10-08 (×3): qty 90, 90d supply, fill #0
  Filled 2023-02-01: qty 90, 90d supply, fill #1

## 2022-10-04 NOTE — Patient Instructions (Signed)
Call and talk to [facility]

## 2022-10-05 ENCOUNTER — Encounter: Payer: Self-pay | Admitting: Physician Assistant

## 2022-10-05 ENCOUNTER — Other Ambulatory Visit: Payer: Self-pay

## 2022-10-05 DIAGNOSIS — G8929 Other chronic pain: Secondary | ICD-10-CM | POA: Diagnosis not present

## 2022-10-05 DIAGNOSIS — M7061 Trochanteric bursitis, right hip: Secondary | ICD-10-CM | POA: Diagnosis not present

## 2022-10-05 DIAGNOSIS — M533 Sacrococcygeal disorders, not elsewhere classified: Secondary | ICD-10-CM | POA: Diagnosis not present

## 2022-10-08 ENCOUNTER — Encounter: Payer: Self-pay | Admitting: Physician Assistant

## 2022-10-08 ENCOUNTER — Other Ambulatory Visit: Payer: Self-pay | Admitting: Physician Assistant

## 2022-10-08 ENCOUNTER — Other Ambulatory Visit: Payer: Self-pay

## 2022-10-08 DIAGNOSIS — J441 Chronic obstructive pulmonary disease with (acute) exacerbation: Secondary | ICD-10-CM

## 2022-10-08 MED ORDER — ALBUTEROL SULFATE (2.5 MG/3ML) 0.083% IN NEBU: 2.5000 mg | INHALATION_SOLUTION | Freq: Four times a day (QID) | RESPIRATORY_TRACT | 1 refills | Status: DC | PRN

## 2022-10-10 ENCOUNTER — Other Ambulatory Visit: Payer: Self-pay

## 2022-10-10 ENCOUNTER — Other Ambulatory Visit (HOSPITAL_BASED_OUTPATIENT_CLINIC_OR_DEPARTMENT_OTHER): Payer: Self-pay

## 2022-10-10 ENCOUNTER — Telehealth: Payer: Self-pay

## 2022-10-10 DIAGNOSIS — J441 Chronic obstructive pulmonary disease with (acute) exacerbation: Secondary | ICD-10-CM

## 2022-10-10 MED ORDER — ALBUTEROL SULFATE (2.5 MG/3ML) 0.083% IN NEBU
2.5000 mg | INHALATION_SOLUTION | Freq: Four times a day (QID) | RESPIRATORY_TRACT | 1 refills | Status: AC | PRN
  Filled 2022-10-10 – 2022-10-16 (×4): qty 180, 15d supply, fill #0

## 2022-10-10 NOTE — Telephone Encounter (Signed)
Patient made aware about nebulizer being faxed in to [facility], Patsy Lager will be in contact

## 2022-10-15 ENCOUNTER — Other Ambulatory Visit: Payer: Self-pay

## 2022-10-16 ENCOUNTER — Other Ambulatory Visit: Payer: Self-pay

## 2022-10-18 ENCOUNTER — Encounter: Payer: Self-pay | Admitting: Emergency Medicine

## 2022-10-18 ENCOUNTER — Ambulatory Visit (INDEPENDENT_AMBULATORY_CARE_PROVIDER_SITE_OTHER): Payer: Medicare HMO | Admitting: Emergency Medicine

## 2022-10-18 VITALS — BP 124/76 | HR 76 | Temp 97.9°F | Ht 67.0 in | Wt 151.8 lb

## 2022-10-18 DIAGNOSIS — J189 Pneumonia, unspecified organism: Secondary | ICD-10-CM | POA: Diagnosis not present

## 2022-10-18 DIAGNOSIS — R918 Other nonspecific abnormal finding of lung field: Secondary | ICD-10-CM

## 2022-10-18 DIAGNOSIS — J449 Chronic obstructive pulmonary disease, unspecified: Secondary | ICD-10-CM

## 2022-10-18 DIAGNOSIS — Z87891 Personal history of nicotine dependence: Secondary | ICD-10-CM | POA: Diagnosis not present

## 2022-10-18 NOTE — Assessment & Plan Note (Signed)
Congratulated her on her cessation

## 2022-10-18 NOTE — Progress Notes (Signed)
Subjective:    Patient ID: Martha Koch, female    DOB: February 22, 1956, 67 y.o.   MRN: [id number]  HPI 67 year old smoker (30 pack years, quit last month) with a history of hypertension, hyperlipidemia, cervical cancer post chemo-radiation therapy.  She was hospitalized 3/17-3/18/2024 with dyspnea, diagnosed with an acute exacerbation of COPD and community-acquired pneumonia.  She was treated in urgent care with levofloxacin but ultimately had to be admitted with persistent symptoms, relative hypotension at presentation.  Her RVP was positive for metapneumovirus, received antibiotics and steroids.  She reports Not on any maintenance BD therapy.  Does have albuterol, uses about 2-3x a week. Her energy level is still not to baseline. She can have SOB w chores, showering. Minimal cough.   CT-PA 09/16/2022 reviewed by me, shows no evidence of mediastinal adenopathy, prominent hilar nodes, centrilobular paraseptal emphysema, new interstitial patchy airspace disease in the posterior right upper lobe, central right lower lobe, posterior left upper lobe.  There was a 6 mm ill-defined nodular opacity in the left lower lobe  She participates in lung cancer screening program, most recent low-dose CT chest 08/30/2022, showed small bilateral pulmonary nodules measuring up to 3.5 mm, RADS 2 study   Review of Systems As per HPI  Past Medical History:  Diagnosis Date   Cervical cancer cervical   Hyperlipidemia    Hypertension    Radiation 06/02/14-07/13/14   pelvis 45 gray, vaginal cuff/parametrial boost to 50.4 gray   Radiation 04/06/15-05/13/15   anterior abdominal wall 50.4 gray     Family History  Problem Relation Age of Onset   Hypertension Father    Breast cancer Maternal Aunt        breast cancer, x 4 aunts    Breast cancer Daughter    Cancer Other    Diabetes Other    Colon cancer Neg Hx    Colon polyps Neg Hx    Esophageal cancer Neg Hx    Stomach cancer Neg Hx    Rectal cancer Neg Hx     Neuropathy Neg Hx      Social History   Socioeconomic History   Marital status: Single    Spouse name: Not on file   Number of children: 3   Years of education: 12   Highest education level: Not on file  Occupational History   Occupation: [employer]     Employer: [employer]  Tobacco Use   Smoking status: Former    Packs/day: 0.50    Years: 30.00    Additional pack years: 0.00    Total pack years: 15.00    Types: Cigarettes    Quit date: 09/12/2022    Years since quitting: 0.0   Smokeless tobacco: Never  Vaping Use   Vaping Use: Never used  Substance and Sexual Activity   Alcohol use: No    Alcohol/week: 0.0 standard drinks of alcohol   Drug use: No   Sexual activity: Not Currently  Other Topics Concern   Not on file  Social History Narrative   Has 3 children. They live in [place].   Has 3 grandchildren.    [name] (oldest daughter)    Lives alone.    Social Determinants of Health   Financial Resource Strain: Low Risk  (10/03/2022)   Overall Financial Resource Strain (CARDIA)    Difficulty of Paying Living Expenses: Not hard at all  Food Insecurity: No Food Insecurity (10/03/2022)   Hunger Vital Sign    Worried About Running Out  of Food in the Last Year: Never true    Ran Out of Food in the Last Year: Never true  Transportation Needs: No Transportation Needs (10/03/2022)   PRAPARE - Administrator, Civil Service (Medical): No    Lack of Transportation (Non-Medical): No  Physical Activity: Inactive (10/03/2022)   Exercise Vital Sign    Days of Exercise per Week: 0 days    Minutes of Exercise per Session: 0 min  Stress: No Stress Concern Present (10/03/2022)   [employer] of Occupational Health - Occupational Stress Questionnaire    Feeling of Stress : Not at all  Social Connections: Not on file  Intimate Partner Violence: Not At Risk (09/16/2022)   Humiliation, Afraid, Rape, and Kick questionnaire    Fear of Current or Ex-Partner:  No    Emotionally Abused: No    Physically Abused: No    Sexually Abused: No     Allergies  Allergen Reactions   Aspirin     Stomach cramps   Penicillins Hives    Did it involve swelling of the face/tongue/throat, SOB, or low BP? No Did it involve sudden or severe rash/hives, skin peeling, or any reaction on the inside of your mouth or nose? Yes Did you need to seek medical attention at a hospital or doctor's office? Yes When did it last happen?    Several Years Ago   If all above answers are "NO", may proceed with cephalosporin use.       Outpatient Medications Prior to Visit  Medication Sig Dispense Refill   albuterol (VENTOLIN HFA) 108 (90 Base) MCG/ACT inhaler Inhale 2 puffs into the lungs every 6 (six) hours as needed for wheezing or shortness of breath. 18 g 2   amLODipine (NORVASC) 10 MG tablet Take 1 tablet (10 mg total) by mouth daily. 90 tablet 1   atorvastatin (LIPITOR) 20 MG tablet Take 1 tablet (20 mg total) by mouth daily. 90 tablet 1   cetirizine (ZYRTEC) 10 MG tablet Take 1 tablet (10 mg total) by mouth daily. (Patient taking differently: Take 10 mg by mouth daily as needed for allergies.) 100 tablet 1   cholecalciferol 25 MCG (1000 UT) tablet Take 1,000 Units by mouth daily.     fluticasone (FLONASE) 50 MCG/ACT nasal spray Place 2 sprays into both nostrils daily. (Patient taking differently: Place 2 sprays into both nostrils daily as needed for allergies.) 16 g 6   naproxen (NAPROSYN) 500 MG tablet Take 1 tablet (500 mg total) by mouth 2 times daily with meals for 30 days then reduce to 1 tablet (500 mg total) by mouth nightly. (Patient taking differently: Take 500 mg by mouth daily as needed for moderate pain.) 60 tablet 5   albuterol (PROVENTIL) (2.5 MG/3ML) 0.083% nebulizer solution Take 3 mLs (2.5 mg total) by nebulization every 6 (six) hours as needed for wheezing or shortness of breath. (Patient not taking: Reported on 10/18/2022) 180 mL 1   No  facility-administered medications prior to visit.         Objective:   Physical Exam Vitals:   10/18/22 0928  BP: 124/76  Pulse: 76  Temp: 97.9 F (36.6 C)  TempSrc: Oral  SpO2: 99%  Weight: 151 lb 12.8 oz (68.9 kg)  Height:  (1.702 m)   Gen: Pleasant, well-nourished, in no distress,  normal affect  ENT: No lesions,  mouth clear,  oropharynx clear, no postnasal drip  Neck: No JVD, no stridor  Lungs: No use of  accessory muscles, no crackles or wheezing on normal respiration, no wheeze on forced expiration  Cardiovascular: RRR, heart sounds normal, no murmur or gallops, no peripheral edema  Musculoskeletal: No deformities, no cyanosis or clubbing  Neuro: alert, awake, non focal  Skin: Warm, no lesions or rash      Assessment & Plan:   CAP (community acquired pneumonia) Required hospitalization.  Positive metapneumovirus and CT findings consistent with both viral and bacterial pneumonia.  She is significantly improved, not quite back to baseline.  COPD (chronic obstructive pulmonary disease) Pulmonary function testing from 2017 showed moderate obstruction.  Her CT imaging has shown emphysematous change.  She was clinically well when I last saw her and we deferred bronchodilators.  This was her first acute exacerbation.  She is still having some exertional shortness of breath.  I suspect that she will benefit from maintenance BD therapy going forward.  She wants to hold off, see how much she improves before committing to this.  We will recheck her PFT to compare with 2017 and we will talk about possible maintenance therapy next time.  Pulmonary nodules Participates in lung cancer screening program.  Has some scattered nodules that have been stable.  Last CT chest was 08/30/2022.  Her CT-PA 09/16/2022 when she was acutely ill showed a new 6 mm ill-defined nodular focus in the left lower lobe, almost certainly infectious as it was not present previously.  Agree with repeat  CT in May (already scheduled) to ensure clearance.  At that point she can go back to annual screening.  Former tobacco use Congratulated her on her cessation   Levy Pupa, MD, PhD 10/18/2022, 10:16 AM [facility] [phone] or if no answer before 7:00PM call [phone] For any issues after 7:00PM please call eLink [phone]

## 2022-10-18 NOTE — Assessment & Plan Note (Signed)
Pulmonary function testing from 2017 showed moderate obstruction.  Her CT imaging has shown emphysematous change.  She was clinically well when I last saw her and we deferred bronchodilators.  This was her first acute exacerbation.  She is still having some exertional shortness of breath.  I suspect that she will benefit from maintenance BD therapy going forward.  She wants to hold off, see how much she improves before committing to this.  We will recheck her PFT to compare with 2017 and we will talk about possible maintenance therapy next time.

## 2022-10-18 NOTE — Addendum Note (Signed)
Addended by: Dorisann Frames R on: 10/18/2022 10:53 AM   Modules accepted: Orders

## 2022-10-18 NOTE — Assessment & Plan Note (Addendum)
Required hospitalization.  Positive metapneumovirus and CT findings consistent with both viral and bacterial pneumonia.  She is significantly improved, not quite back to baseline.

## 2022-10-18 NOTE — Assessment & Plan Note (Signed)
Participates in lung cancer screening program.  Has some scattered nodules that have been stable.  Last CT chest was 08/30/2022.  Her CT-PA 09/16/2022 when she was acutely ill showed a new 6 mm ill-defined nodular focus in the left lower lobe, almost certainly infectious as it was not present previously.  Agree with repeat CT in May (already scheduled) to ensure clearance.  At that point she can go back to annual screening.

## 2022-10-18 NOTE — Patient Instructions (Signed)
Agree with getting your follow-up CT scan of the chest in May.  We will review this study at your next visit We will repeat your pulmonary function testing to compare with 2017 Okay to keep albuterol available to use 2 puffs up to every 4 hours if needed for shortness of breath, chest tightness, wheezing. You could also try using 2 puffs about 10 minutes prior to exercise to see if it helps your exertional tolerance and breathing Congratulations on stopping smoking!  Do everything possible to avoid restarting. Follow with Dr. Delton Coombes next available with full pulmonary function testing on the same day.

## 2022-10-19 ENCOUNTER — Other Ambulatory Visit: Payer: Self-pay

## 2022-10-22 ENCOUNTER — Other Ambulatory Visit: Payer: Self-pay

## 2022-10-30 ENCOUNTER — Other Ambulatory Visit: Payer: Self-pay

## 2022-11-06 DIAGNOSIS — I739 Peripheral vascular disease, unspecified: Secondary | ICD-10-CM | POA: Diagnosis not present

## 2022-11-09 ENCOUNTER — Ambulatory Visit
Admission: RE | Admit: 2022-11-09 | Discharge: 2022-11-09 | Disposition: A | Discharge status: Home / Self Care | Payer: Medicare HMO | Source: Ambulatory Visit | Attending: Physician Assistant | Admitting: Physician Assistant

## 2022-11-09 DIAGNOSIS — I700 Atherosclerosis of aorta: Secondary | ICD-10-CM | POA: Diagnosis not present

## 2022-11-09 DIAGNOSIS — J9691 Respiratory failure, unspecified with hypoxia: Secondary | ICD-10-CM

## 2022-11-09 DIAGNOSIS — R911 Solitary pulmonary nodule: Secondary | ICD-10-CM | POA: Diagnosis not present

## 2022-11-09 DIAGNOSIS — R918 Other nonspecific abnormal finding of lung field: Secondary | ICD-10-CM

## 2022-11-09 DIAGNOSIS — J189 Pneumonia, unspecified organism: Secondary | ICD-10-CM

## 2022-11-09 DIAGNOSIS — J439 Emphysema, unspecified: Secondary | ICD-10-CM | POA: Diagnosis not present

## 2022-11-16 DIAGNOSIS — Z720 Tobacco use: Secondary | ICD-10-CM | POA: Diagnosis not present

## 2022-11-16 DIAGNOSIS — E785 Hyperlipidemia, unspecified: Secondary | ICD-10-CM | POA: Diagnosis not present

## 2022-11-16 DIAGNOSIS — I10 Essential (primary) hypertension: Secondary | ICD-10-CM | POA: Diagnosis not present

## 2022-11-16 DIAGNOSIS — I739 Peripheral vascular disease, unspecified: Secondary | ICD-10-CM | POA: Diagnosis not present

## 2022-11-21 ENCOUNTER — Ambulatory Visit: Payer: Medicare HMO | Admitting: Emergency Medicine

## 2022-11-21 ENCOUNTER — Encounter (HOSPITAL_BASED_OUTPATIENT_CLINIC_OR_DEPARTMENT_OTHER): Payer: Medicare HMO

## 2022-11-23 ENCOUNTER — Other Ambulatory Visit: Payer: Self-pay

## 2022-11-23 DIAGNOSIS — I739 Peripheral vascular disease, unspecified: Secondary | ICD-10-CM | POA: Diagnosis not present

## 2022-11-23 MED ORDER — CILOSTAZOL 100 MG PO TABS
100.0000 mg | ORAL_TABLET | Freq: Two times a day (BID) | ORAL | 3 refills | Status: DC
  Filled 2022-11-23: qty 180, 90d supply, fill #0

## 2022-11-27 ENCOUNTER — Other Ambulatory Visit: Payer: Self-pay

## 2022-11-28 ENCOUNTER — Other Ambulatory Visit: Payer: Self-pay

## 2022-11-29 DIAGNOSIS — I739 Peripheral vascular disease, unspecified: Secondary | ICD-10-CM | POA: Diagnosis not present

## 2022-11-29 DIAGNOSIS — J439 Emphysema, unspecified: Secondary | ICD-10-CM | POA: Diagnosis not present

## 2022-11-29 DIAGNOSIS — I745 Embolism and thrombosis of iliac artery: Secondary | ICD-10-CM | POA: Diagnosis not present

## 2022-11-29 DIAGNOSIS — I700 Atherosclerosis of aorta: Secondary | ICD-10-CM | POA: Diagnosis not present

## 2022-11-29 DIAGNOSIS — I70201 Unspecified atherosclerosis of native arteries of extremities, right leg: Secondary | ICD-10-CM | POA: Diagnosis not present

## 2022-12-03 ENCOUNTER — Other Ambulatory Visit: Payer: Self-pay

## 2022-12-03 DIAGNOSIS — I739 Peripheral vascular disease, unspecified: Secondary | ICD-10-CM | POA: Diagnosis not present

## 2022-12-03 MED ORDER — ATORVASTATIN CALCIUM 80 MG PO TABS
80.0000 mg | ORAL_TABLET | Freq: Every day | ORAL | 3 refills | Status: DC
  Filled 2022-12-03: qty 90, 90d supply, fill #0

## 2022-12-05 ENCOUNTER — Other Ambulatory Visit: Payer: Self-pay

## 2022-12-26 ENCOUNTER — Ambulatory Visit (HOSPITAL_COMMUNITY)
Admission: RE | Admit: 2022-12-26 | Discharge: 2022-12-26 | Disposition: A | Discharge status: Home / Self Care | Payer: Medicare HMO | Source: Ambulatory Visit | Attending: Emergency Medicine | Admitting: Emergency Medicine

## 2022-12-26 ENCOUNTER — Other Ambulatory Visit: Payer: Self-pay

## 2022-12-26 ENCOUNTER — Encounter (HOSPITAL_BASED_OUTPATIENT_CLINIC_OR_DEPARTMENT_OTHER): Payer: Medicare HMO

## 2022-12-26 DIAGNOSIS — J449 Chronic obstructive pulmonary disease, unspecified: Secondary | ICD-10-CM | POA: Insufficient documentation

## 2022-12-26 LAB — PULMONARY FUNCTION TEST
DL/VA % pred: 42 %
DL/VA: 1.74 ml/min/mmHg/L
DLCO unc % pred: 29 %
DLCO unc: 6.42 ml/min/mmHg
FEF 25-75 Post: 0.83 L/sec
FEF 25-75 Pre: 0.59 L/sec
FEF2575-%Change-Post: 40 %
FEF2575-%Pred-Post: 37 %
FEF2575-%Pred-Pre: 26 %
FEV1-%Change-Post: 8 %
FEV1-%Pred-Post: 60 %
FEV1-%Pred-Pre: 56 %
FEV1-Post: 1.61 L
FEV1-Pre: 1.49 L
FEV1FVC-%Change-Post: 2 %
FEV1FVC-%Pred-Pre: 77 %
FEV6-%Change-Post: 6 %
FEV6-%Pred-Post: 75 %
FEV6-%Pred-Pre: 70 %
FEV6-Post: 2.52 L
FEV6-Pre: 2.36 L
FEV6FVC-%Change-Post: 1 %
FEV6FVC-%Pred-Post: 100 %
FEV6FVC-%Pred-Pre: 98 %
FVC-%Change-Post: 5 %
FVC-%Pred-Post: 75 %
FVC-%Pred-Pre: 71 %
FVC-Post: 2.62 L
FVC-Pre: 2.5 L
Post FEV1/FVC ratio: 61 %
Post FEV6/FVC ratio: 96 %
Pre FEV1/FVC ratio: 60 %
Pre FEV6/FVC Ratio: 95 %
RV % pred: 80 %
RV: 1.83 L
TLC % pred: 81 %
TLC: 4.49 L

## 2022-12-26 MED ORDER — ALBUTEROL SULFATE (2.5 MG/3ML) 0.083% IN NEBU
2.5000 mg | INHALATION_SOLUTION | Freq: Once | RESPIRATORY_TRACT | Status: AC
  Administered 2022-12-26: 2.5 mg via RESPIRATORY_TRACT

## 2022-12-27 ENCOUNTER — Other Ambulatory Visit: Payer: Self-pay | Admitting: Family Medicine

## 2022-12-27 DIAGNOSIS — Z1231 Encounter for screening mammogram for malignant neoplasm of breast: Secondary | ICD-10-CM

## 2023-01-09 ENCOUNTER — Ambulatory Visit
Admission: RE | Admit: 2023-01-09 | Discharge: 2023-01-09 | Disposition: A | Discharge status: Home / Self Care | Payer: Medicare HMO | Source: Ambulatory Visit | Attending: Family Medicine | Admitting: Family Medicine

## 2023-01-09 DIAGNOSIS — Z90722 Acquired absence of ovaries, bilateral: Secondary | ICD-10-CM | POA: Diagnosis not present

## 2023-01-09 DIAGNOSIS — M8588 Other specified disorders of bone density and structure, other site: Secondary | ICD-10-CM | POA: Diagnosis not present

## 2023-01-09 DIAGNOSIS — N958 Other specified menopausal and perimenopausal disorders: Secondary | ICD-10-CM | POA: Diagnosis not present

## 2023-01-09 DIAGNOSIS — E2839 Other primary ovarian failure: Secondary | ICD-10-CM

## 2023-01-10 ENCOUNTER — Other Ambulatory Visit: Payer: Self-pay | Admitting: Family Medicine

## 2023-01-10 ENCOUNTER — Encounter: Payer: Self-pay | Admitting: Emergency Medicine

## 2023-01-10 ENCOUNTER — Other Ambulatory Visit: Payer: Self-pay

## 2023-01-10 ENCOUNTER — Ambulatory Visit: Payer: Medicare HMO | Admitting: Emergency Medicine

## 2023-01-10 VITALS — BP 138/80 | HR 106 | Temp 98.3°F | Ht 66.0 in | Wt 152.0 lb

## 2023-01-10 DIAGNOSIS — Z720 Tobacco use: Secondary | ICD-10-CM

## 2023-01-10 DIAGNOSIS — J449 Chronic obstructive pulmonary disease, unspecified: Secondary | ICD-10-CM

## 2023-01-10 DIAGNOSIS — R918 Other nonspecific abnormal finding of lung field: Secondary | ICD-10-CM

## 2023-01-10 DIAGNOSIS — Z87891 Personal history of nicotine dependence: Secondary | ICD-10-CM

## 2023-01-10 DIAGNOSIS — M810 Age-related osteoporosis without current pathological fracture: Secondary | ICD-10-CM

## 2023-01-10 MED ORDER — ALENDRONATE SODIUM 70 MG PO TABS
70.0000 mg | ORAL_TABLET | ORAL | 1 refills | Status: DC
  Filled 2023-01-10: qty 12, 84d supply, fill #0

## 2023-01-10 MED ORDER — STIOLTO RESPIMAT 2.5-2.5 MCG/ACT IN AERS: 2.0000 | INHALATION_SPRAY | Freq: Every day | RESPIRATORY_TRACT | 0 refills | Status: DC

## 2023-01-10 NOTE — Assessment & Plan Note (Signed)
Severe obstruction confirmed on her pulmonary function testing.  She does have exertional shortness of breath.  Will do a trial of Stiolto to see if she gets benefit.  Explained that this is a maintenance inhaler and that she keep her albuterol available to use if needed.

## 2023-01-10 NOTE — Addendum Note (Signed)
Addended by: Dorisann Frames R on: 01/10/2023 03:25 PM   Modules accepted: Orders

## 2023-01-10 NOTE — Assessment & Plan Note (Addendum)
Reassuring follow-up CT chest on 11/09/2022.  She can get back into the standard lung cancer screening program, next scan to be done in May 2025.

## 2023-01-10 NOTE — Progress Notes (Signed)
Subjective:    Patient ID: Martha Koch, female    DOB: Feb 12, 1956, 67 y.o.   MRN: [id number]  HPI 67 year old smoker (30 pack years, quit last month) with a history of hypertension, hyperlipidemia, cervical cancer post chemo-radiation therapy.  She was hospitalized 3/17-3/18/2024 with dyspnea, diagnosed with an acute exacerbation of COPD and community-acquired pneumonia.  She was treated in urgent care with levofloxacin but ultimately had to be admitted with persistent symptoms, relative hypotension at presentation.  Her RVP was positive for metapneumovirus, received antibiotics and steroids.  She reports Not on any maintenance BD therapy.  Does have albuterol, uses about 2-3x a week. Her energy level is still not to baseline. She can have SOB w chores, showering. Minimal cough.   CT-PA 09/16/2022 reviewed by me, shows no evidence of mediastinal adenopathy, prominent hilar nodes, centrilobular paraseptal emphysema, new interstitial patchy airspace disease in the posterior right upper lobe, central right lower lobe, posterior left upper lobe.  There was a 6 mm ill-defined nodular opacity in the left lower lobe  She participates in lung cancer screening program, most recent low-dose CT chest 08/30/2022, showed small bilateral pulmonary nodules measuring up to 3.5 mm, RADS 2 study   ROV 01/10/2023 --follow-up visit for 67 year old woman with a history of tobacco use (30 pack years), hypertension, hyperlipidemia, cervical cancer that was treated with chemoradiation.  I saw her post hospitalization for CAP and exacerbation of COPD back in April 2024.  Not currently on maintenance BD therapy.  She underwent pulmonary function testing as below.  Also had a repeat CT scan of the chest to ensure that her CAP fully cleared She reports today that her functional capacity is limited by her PVD, LE weakness. She does get some exertional SOB.   CT chest 11/09/2022 reviewed by me, shows interval resolution of  her bilateral pulmonary opacities including the 6 mm left lower lobe nodular opacity.  Her lymphadenopathy resolved as well.  No significant nodules.    Pulmonary function testing performed 12/26/2022 reviewed by me, shows severe obstruction with an FEV1 1.49 L (56% predicted), normal lung volumes, significantly decreased diffusion capacity.  Her FEV1 in 2017 was 1.79 L (76% predicted)   Review of Systems As per HPI  Past Medical History:  Diagnosis Date   Cervical cancer (HCC) cervical   Hyperlipidemia    Hypertension    Radiation 06/02/14-07/13/14   pelvis 45 gray, vaginal cuff/parametrial boost to 50.4 gray   Radiation 04/06/15-05/13/15   anterior abdominal wall 50.4 gray     Family History  Problem Relation Age of Onset   Hypertension Father    Breast cancer Maternal Aunt        breast cancer, x 4 aunts    Breast cancer Daughter    Cancer Other    Diabetes Other    Colon cancer Neg Hx    Colon polyps Neg Hx    Esophageal cancer Neg Hx    Stomach cancer Neg Hx    Rectal cancer Neg Hx    Neuropathy Neg Hx      Social History   Socioeconomic History   Marital status: Single    Spouse name: Not on file   Number of children: 3   Years of education: 12   Highest education level: Not on file  Occupational History   Occupation: [employer]     Employer: [employer]  Tobacco Use   Smoking status: Former    Current packs/day: 0.00    Average  packs/day: 0.5 packs/day for 30.0 years (15.0 ttl pk-yrs)    Types: Cigarettes    Start date: 09/11/1992    Quit date: 09/12/2022    Years since quitting: 0.3   Smokeless tobacco: Never  Vaping Use   Vaping status: Never Used  Substance and Sexual Activity   Alcohol use: No    Alcohol/week: 0.0 standard drinks of alcohol   Drug use: No   Sexual activity: Not Currently  Other Topics Concern   Not on file  Social History Narrative   Has 3 children. They live in [place].   Has 3 grandchildren.    [name] (oldest  daughter)    Lives alone.    Social Determinants of Health   Financial Resource Strain: Low Risk  (10/03/2022)   Overall Financial Resource Strain (CARDIA)    Difficulty of Paying Living Expenses: Not hard at all  Food Insecurity: No Food Insecurity (10/03/2022)   Hunger Vital Sign    Worried About Running Out of Food in the Last Year: Never true    Ran Out of Food in the Last Year: Never true  Transportation Needs: No Transportation Needs (10/03/2022)   PRAPARE - Administrator, Civil Service (Medical): No    Lack of Transportation (Non-Medical): No  Physical Activity: Inactive (10/03/2022)   Exercise Vital Sign    Days of Exercise per Week: 0 days    Minutes of Exercise per Session: 0 min  Stress: No Stress Concern Present (10/03/2022)   [employer] of Occupational Health - Occupational Stress Questionnaire    Feeling of Stress : Not at all  Social Connections: Not on file  Intimate Partner Violence: Not At Risk (09/16/2022)   Humiliation, Afraid, Rape, and Kick questionnaire    Fear of Current or Ex-Partner: No    Emotionally Abused: No    Physically Abused: No    Sexually Abused: No     Allergies  Allergen Reactions   Aspirin     Stomach cramps   Penicillins Hives    Did it involve swelling of the face/tongue/throat, SOB, or low BP? No Did it involve sudden or severe rash/hives, skin peeling, or any reaction on the inside of your mouth or nose? Yes Did you need to seek medical attention at a hospital or doctor's office? Yes When did it last happen?    Several Years Ago   If all above answers are "NO", may proceed with cephalosporin use.       Outpatient Medications Prior to Visit  Medication Sig Dispense Refill   albuterol (PROVENTIL) (2.5 MG/3ML) 0.083% nebulizer solution Take 3 mLs (2.5 mg total) by nebulization every 6 (six) hours as needed for wheezing or shortness of breath. 180 mL 1   albuterol (VENTOLIN HFA) 108 (90 Base) MCG/ACT inhaler Inhale 2 puffs  into the lungs every 6 (six) hours as needed for wheezing or shortness of breath. 18 g 2   alendronate (FOSAMAX) 70 MG tablet Take 1 tablet (70 mg total) by mouth every 7 (seven) days. Take with a full glass of water on an empty stomach. 12 tablet 1   amLODipine (NORVASC) 10 MG tablet Take 1 tablet (10 mg total) by mouth daily. 90 tablet 1   atorvastatin (LIPITOR) 80 MG tablet Take 1 tablet (80 mg total) by mouth at bedtime. 90 tablet 3   cetirizine (ZYRTEC) 10 MG tablet Take 1 tablet (10 mg total) by mouth daily. (Patient taking differently: Take 10 mg by mouth daily as  needed for allergies.) 100 tablet 1   cholecalciferol 25 MCG (1000 UT) tablet Take 1,000 Units by mouth daily.     cilostazol (PLETAL) 100 MG tablet Take 1 tablet (100 mg total) by mouth 2 (two) times daily. 180 tablet 3   fluticasone (FLONASE) 50 MCG/ACT nasal spray Place 2 sprays into both nostrils daily. (Patient taking differently: Place 2 sprays into both nostrils daily as needed for allergies.) 16 g 6   naproxen (NAPROSYN) 500 MG tablet Take 1 tablet (500 mg total) by mouth 2 times daily with meals for 30 days then reduce to 1 tablet (500 mg total) by mouth nightly. (Patient taking differently: Take 500 mg by mouth daily as needed for moderate pain.) 60 tablet 5   atorvastatin (LIPITOR) 20 MG tablet Take 1 tablet (20 mg total) by mouth daily. 90 tablet 1   No facility-administered medications prior to visit.         Objective:   Physical Exam Vitals:   01/10/23 1501  BP: 138/80  Pulse: (!) 106  Temp: 98.3 F (36.8 C)  TempSrc: Oral  SpO2: 97%  Weight: 152 lb (68.9 kg)  Height: 5\' 6"  (1.676 m)    Gen: Pleasant, well-nourished, in no distress,  normal affect  ENT: No lesions,  mouth clear,  oropharynx clear, no postnasal drip  Neck: No JVD, no stridor  Lungs: No use of accessory muscles, no crackles or wheezing on normal respiration, no wheeze on forced expiration  Cardiovascular: RRR, heart sounds normal,  no murmur or gallops, no peripheral edema  Musculoskeletal: No deformities, no cyanosis or clubbing  Neuro: alert, awake, non focal  Skin: Warm, no lesions or rash      Assessment & Plan:   COPD (chronic obstructive pulmonary disease) (HCC) Severe obstruction confirmed on her pulmonary function testing.  She does have exertional shortness of breath.  Will do a trial of Stiolto to see if she gets benefit.  Explained that this is a maintenance inhaler and that she keep her albuterol available to use if needed.  Pulmonary nodules Reassuring follow-up CT chest on 11/09/2022.  She can get back into the standard lung cancer screening program, next scan to be done in May 2025.  Continuous tobacco abuse .  Former tobacco use She has stopped smoking.  Congratulated her on this and supported her in her cessation    Levy Pupa, MD, PhD 01/10/2023, 3:15 PM [facility] [phone] or if no answer before 7:00PM call [phone] For any issues after 7:00PM please call eLink [phone]

## 2023-01-10 NOTE — Patient Instructions (Addendum)
We reviewed your CT scan of the chest today.  Your pneumonia from your recent hospitalization has resolved.  Good news. You will need your next lung cancer screening CT scan of the chest in May 2025. We reviewed your pulmonary function testing. We will try starting Stiolto 2 puffs once daily.  Keep track of how this medication helps you.  We will discuss next time and consider continuing it long-term Keep your albuterol available to use 2 puffs up to every 4 hours if needed for shortness of breath, chest tightness, wheezing. Congratulations on stopping smoking Follow with APP in about 6 weeks to review how you are doing on the Stiolto Follow with Dr Delton Coombes in May 2025, sooner if you have any problems

## 2023-01-10 NOTE — Assessment & Plan Note (Signed)
She has stopped smoking.  Congratulated her on this and supported her in her cessation

## 2023-01-11 ENCOUNTER — Other Ambulatory Visit: Payer: Self-pay

## 2023-01-16 ENCOUNTER — Ambulatory Visit: Payer: Medicare HMO | Attending: Family Medicine | Admitting: Pharmacist

## 2023-01-16 ENCOUNTER — Encounter: Payer: Self-pay | Admitting: Pharmacist

## 2023-01-16 ENCOUNTER — Other Ambulatory Visit: Payer: Self-pay | Admitting: Pharmacist

## 2023-01-16 DIAGNOSIS — Z79899 Other long term (current) drug therapy: Secondary | ICD-10-CM

## 2023-01-16 NOTE — Progress Notes (Signed)
01/16/2023 Name: Martha Koch MRN: [id number] DOB: 1955/12/16  No chief complaint on file.   Martha Koch is a 67 y.o. year old female who presented for a telephone visit.   They were referred to the pharmacist by a quality report for assistance in managing medication access. She is at risk for failing adherence measures to her statin (MAC).   Subjective:  Care Team: Primary Care Provider: Hoy Register, MD ; Next Scheduled Visit: 02/04/2023  Medication Access/Adherence  Current Pharmacy:  [facility] [address], [address] [place] [address] Phone: [phone] Fax: [phone]  Patient reports affordability concerns with their medications: No  Patient reports access/transportation concerns to their pharmacy: No  Patient reports adherence concerns with their medications:  No    Of note, vascular ([facility], [address] [place]) changed her atorvastatin dose from 20mg  daily to 80 mg daily on 12/03/2022 visit d/t claudication secondary to PVD.   Objective:  Lab Results  Component Value Date   HGBA1C 6.1 (H) 08/02/2022    Lab Results  Component Value Date   CREATININE 0.84 09/17/2022   BUN 12 09/17/2022   NA 139 09/17/2022   K 3.9 09/17/2022   CL 108 09/17/2022   CO2 22 09/17/2022    Lab Results  Component Value Date   CHOL 211 (H) 08/02/2022   HDL 75 08/02/2022   LDLCALC 122 (H) 08/02/2022   TRIG 81 08/02/2022   CHOLHDL 2.4 04/18/2021    Medications Reviewed Today     Reviewed by Leslye Peer, MD (Physician) on 01/10/23 at 1509  Med List Status: <None>   Medication Order Taking? Sig Documenting Provider Last Dose Status Informant  albuterol (PROVENTIL) (2.5 MG/3ML) 0.083% nebulizer solution [id number] Yes Take 3 mLs (2.5 mg total) by nebulization every 6 (six) hours as needed for wheezing or shortness of breath. Georgian Co M, PA-C  Taking Active   albuterol (VENTOLIN HFA) 108 (90 Base) MCG/ACT inhaler [id number] Yes Inhale 2 puffs into the lungs every 6 (six) hours as needed for wheezing or shortness of breath. Anders Simmonds, PA-C Taking Active   alendronate (FOSAMAX) 70 MG tablet [id number] Yes Take 1 tablet (70 mg total) by mouth every 7 (seven) days. Take with a full glass of water on an empty stomach. Hoy Register, MD Taking Active   amLODipine (NORVASC) 10 MG tablet [id number] Yes Take 1 tablet (10 mg total) by mouth daily. Georgian Co M, PA-C Taking Active   atorvastatin (LIPITOR) 20 MG tablet [id number]  Take 1 tablet (20 mg total) by mouth daily. Georgian Co M, PA-C  Active   atorvastatin (LIPITOR) 80 MG tablet [id number] Yes Take 1 tablet (80 mg total) by mouth at bedtime.  Taking Active   cetirizine (ZYRTEC) 10 MG tablet [id number] Yes Take 1 tablet (10 mg total) by mouth daily.  Patient taking differently: Take 10 mg by mouth daily as needed for allergies.   Hoy Register, MD Taking Active Self  cholecalciferol 25 MCG (1000 UT) tablet [id number] Yes Take 1,000 Units by mouth daily. [provider] Taking Active Self  cilostazol (PLETAL) 100 MG tablet [id number] Yes Take 1 tablet (100 mg total) by mouth 2 (two) times daily.  Taking Active   fluticasone (FLONASE) 50 MCG/ACT nasal spray [id number] Yes Place 2 sprays into both nostrils daily.  Patient taking differently: Place 2 sprays into both nostrils daily as needed for allergies.  Hoy Register, MD Taking Active Self  naproxen (NAPROSYN) 500 MG tablet [id number] Yes Take 1 tablet (500 mg total) by mouth 2 times daily with meals for 30 days then reduce to 1 tablet (500 mg total) by mouth nightly.  Patient taking differently: Take 500 mg by mouth daily as needed for moderate pain.    Taking Active Self              Assessment/Plan:   Medication Management: - Currently strategy sufficient to maintain appropriate adherence to prescribed  medication regimen - 90-day supply currently being filled. Not due for a refill until 03/2023. No further follow-up needed at this time.   Follow Up Plan: with PCP next month.  Butch Penny, PharmD, Patsy [place], CPP Clinical Pharmacist [facility] [phone]

## 2023-02-01 ENCOUNTER — Ambulatory Visit
Admission: RE | Admit: 2023-02-01 | Discharge: 2023-02-01 | Disposition: A | Discharge status: Home / Self Care | Payer: Medicare HMO | Source: Ambulatory Visit | Attending: Family Medicine | Admitting: Family Medicine

## 2023-02-01 ENCOUNTER — Other Ambulatory Visit: Payer: Self-pay

## 2023-02-01 DIAGNOSIS — Z1231 Encounter for screening mammogram for malignant neoplasm of breast: Secondary | ICD-10-CM | POA: Diagnosis not present

## 2023-02-04 ENCOUNTER — Ambulatory Visit: Payer: Medicare HMO | Attending: Family Medicine | Admitting: Family Medicine

## 2023-02-04 ENCOUNTER — Encounter: Payer: Self-pay | Admitting: Family Medicine

## 2023-02-04 ENCOUNTER — Other Ambulatory Visit: Payer: Self-pay

## 2023-02-04 VITALS — BP 134/82 | HR 89 | Temp 98.0°F | Ht 66.0 in | Wt 152.6 lb

## 2023-02-04 DIAGNOSIS — I739 Peripheral vascular disease, unspecified: Secondary | ICD-10-CM

## 2023-02-04 DIAGNOSIS — J449 Chronic obstructive pulmonary disease, unspecified: Secondary | ICD-10-CM | POA: Diagnosis not present

## 2023-02-04 DIAGNOSIS — R42 Dizziness and giddiness: Secondary | ICD-10-CM | POA: Diagnosis not present

## 2023-02-04 DIAGNOSIS — R0981 Nasal congestion: Secondary | ICD-10-CM | POA: Diagnosis not present

## 2023-02-04 DIAGNOSIS — R7303 Prediabetes: Secondary | ICD-10-CM | POA: Diagnosis not present

## 2023-02-04 DIAGNOSIS — I10 Essential (primary) hypertension: Secondary | ICD-10-CM

## 2023-02-04 DIAGNOSIS — M810 Age-related osteoporosis without current pathological fracture: Secondary | ICD-10-CM | POA: Diagnosis not present

## 2023-02-04 MED ORDER — AMLODIPINE BESYLATE 10 MG PO TABS
10.0000 mg | ORAL_TABLET | Freq: Every day | ORAL | 1 refills | Status: DC
  Filled 2023-02-04: qty 90, 90d supply, fill #0

## 2023-02-04 MED ORDER — FLUTICASONE PROPIONATE 50 MCG/ACT NA SUSP
2.0000 | Freq: Every day | NASAL | 6 refills | Status: AC
  Filled 2023-02-04: qty 16, 30d supply, fill #0
  Filled 2023-04-19: qty 16, 30d supply, fill #1
  Filled 2023-07-08: qty 16, 30d supply, fill #2

## 2023-02-04 MED ORDER — ATORVASTATIN CALCIUM 80 MG PO TABS
80.0000 mg | ORAL_TABLET | Freq: Every day | ORAL | 1 refills | Status: DC
  Filled 2023-02-04: qty 90, 90d supply, fill #0

## 2023-02-04 NOTE — Progress Notes (Signed)
Subjective:  Patient ID: Martha Koch, female    DOB: 1955/10/13  Age: 67 y.o. MRN: [id number]  CC: Medical Management of Chronic Issues (Discuss side effect of quitting smoking.)   HPI Martha Koch is a 67 y.o. year old female with a history of hypertension, prediabetes, Emphysema, previous history of cervical cancer (status post total abdominal hysterectomy and salpingo-oophorectomy, s/p chemo and radiation), Previous nicotine dependence (quit in 08/2022), PVD who presents today for chronic disease management.   Interval History: Discussed the use of AI scribe software for clinical note transcription with the patient, who gave verbal consent to proceed.  She presents with lightheadedness and a stuffy nose. The lightheadedness is intermittent and not related to position changes. The patient associates this symptom with nicotine withdrawal, having quit smoking five months ago. The patient denies any associated symptoms such as syncope, palpitations, or chest pain.  In the past when she smoked the lightheadedness resolved.  She denies room spinning and symptoms last for few seconds.  The patient also reports a stuffy nose, with no associated pain in the cheekbones or forehead, and no postnasal drip. The patient has been using a prescribed nasal spray, which has been helpful, but has recently run out.  The patient's PVD has been managed with cilostazol, and the patient reports improvement in symptoms, being able to walk further distances without pain.  Her symptoms are managed by vascular surgery with [facility] and notes reviewed indicates she had opted for medical management.   The patient is also on amlodipine for blood pressure control and atorvastatin for cholesterol management. The patient has been taking alendronate once a week for bone health, following a bone density test. The patient reports possible acid reflux symptoms after taking alendronate, which may be related to the  medication.  For her COPD she was recently placed on a trial of Stiolto by pulmonary and she has an upcoming visit for reassessment in 1 month.       Past Medical History:  Diagnosis Date   Cervical cancer (HCC) cervical   Hyperlipidemia    Hypertension    Radiation 06/02/14-07/13/14   pelvis 45 gray, vaginal cuff/parametrial boost to 50.4 gray   Radiation 04/06/15-05/13/15   anterior abdominal wall 50.4 gray    Past Surgical History:  Procedure Laterality Date   ABDOMINAL HYSTERECTOMY     COLONOSCOPY  2011   MUSCLE BIOPSY Right 09/14/2021   Procedure: MUSCLE BIOPSY RIGHT THIGH AND RIGHT LOWER LEG;  Surgeon: Almond Lint, MD;  Location: [facility] OR;  Service: General;  Laterality: Right;   robotic radical hysterectomy, bso, pelvic lymphadenectomy Bilateral 04/21/14   TUBAL LIGATION      Family History  Problem Relation Age of Onset   Hypertension Father    Breast cancer Maternal Aunt        breast cancer, x 4 aunts    Breast cancer Daughter    Cancer Other    Diabetes Other    Colon cancer Neg Hx    Colon polyps Neg Hx    Esophageal cancer Neg Hx    Stomach cancer Neg Hx    Rectal cancer Neg Hx    Neuropathy Neg Hx     Social History   Socioeconomic History   Marital status: Single    Spouse name: Not on file   Number of children: 3   Years of education: 12   Highest education level: Not on file  Occupational History   Occupation: [employer]  Employer: [employer]  Tobacco Use   Smoking status: Former    Current packs/day: 0.00    Average packs/day: 0.5 packs/day for 30.0 years (15.0 ttl pk-yrs)    Types: Cigarettes    Start date: 09/11/1992    Quit date: 09/12/2022    Years since quitting: 0.3   Smokeless tobacco: Never  Vaping Use   Vaping status: Never Used  Substance and Sexual Activity   Alcohol use: No    Alcohol/week: 0.0 standard drinks of alcohol   Drug use: No   Sexual activity: Not Currently  Other Topics Concern   Not on file  Social  History Narrative   Has 3 children. They live in [place].   Has 3 grandchildren.    Martha Koch (oldest daughter)    Lives alone.    Social Determinants of Health   Financial Resource Strain: Low Risk  (10/03/2022)   Overall Financial Resource Strain (CARDIA)    Difficulty of Paying Living Expenses: Not hard at all  Food Insecurity: No Food Insecurity (10/03/2022)   Hunger Vital Sign    Worried About Running Out of Food in the Last Year: Never true    Ran Out of Food in the Last Year: Never true  Transportation Needs: No Transportation Needs (10/03/2022)   PRAPARE - Administrator, Civil Service (Medical): No    Lack of Transportation (Non-Medical): No  Physical Activity: Inactive (10/03/2022)   Exercise Vital Sign    Days of Exercise per Week: 0 days    Minutes of Exercise per Session: 0 min  Stress: No Stress Concern Present (10/03/2022)   [employer] of Occupational Health - Occupational Stress Questionnaire    Feeling of Stress : Not at all  Social Connections: Not on file    Allergies  Allergen Reactions   Aspirin     Stomach cramps   Penicillins Hives    Did it involve swelling of the face/tongue/throat, SOB, or low BP? No Did it involve sudden or severe rash/hives, skin peeling, or any reaction on the inside of your mouth or nose? Yes Did you need to seek medical attention at a hospital or doctor's office? Yes When did it last happen?    Several Years Ago   If all above answers are "NO", may proceed with cephalosporin use.      Outpatient Medications Prior to Visit  Medication Sig Dispense Refill   alendronate (FOSAMAX) 70 MG tablet Take 1 tablet (70 mg total) by mouth every 7 (seven) days. Take with a full glass of water on an empty stomach. 12 tablet 1   cetirizine (ZYRTEC) 10 MG tablet Take 1 tablet (10 mg total) by mouth daily. (Patient taking differently: Take 10 mg by mouth daily as needed for allergies.) 100 tablet 1   cilostazol (PLETAL) 100  MG tablet Take 1 tablet (100 mg total) by mouth 2 (two) times daily. 180 tablet 3   naproxen (NAPROSYN) 500 MG tablet Take 1 tablet (500 mg total) by mouth 2 times daily with meals for 30 days then reduce to 1 tablet (500 mg total) by mouth nightly. (Patient taking differently: Take 500 mg by mouth daily as needed for moderate pain.) 60 tablet 5   Tiotropium Bromide-Olodaterol (STIOLTO RESPIMAT) 2.5-2.5 MCG/ACT AERS Inhale 2 puffs into the lungs daily. 4 g 0   amLODipine (NORVASC) 10 MG tablet Take 1 tablet (10 mg total) by mouth daily. 90 tablet 1   atorvastatin (LIPITOR) 80 MG tablet Take 1 tablet (  80 mg total) by mouth at bedtime. 90 tablet 3   albuterol (PROVENTIL) (2.5 MG/3ML) 0.083% nebulizer solution Take 3 mLs (2.5 mg total) by nebulization every 6 (six) hours as needed for wheezing or shortness of breath. (Patient not taking: Reported on 02/04/2023) 180 mL 1   albuterol (VENTOLIN HFA) 108 (90 Base) MCG/ACT inhaler Inhale 2 puffs into the lungs every 6 (six) hours as needed for wheezing or shortness of breath. (Patient not taking: Reported on 02/04/2023) 18 g 2   cholecalciferol 25 MCG (1000 UT) tablet Take 1,000 Units by mouth daily. (Patient not taking: Reported on 02/04/2023)     fluticasone (FLONASE) 50 MCG/ACT nasal spray Place 2 sprays into both nostrils daily. (Patient not taking: Reported on 02/04/2023) 16 g 6   No facility-administered medications prior to visit.     ROS Review of Systems  Constitutional:  Negative for activity change and appetite change.  HENT:  Negative for sinus pressure and sore throat.   Respiratory:  Negative for chest tightness, shortness of breath and wheezing.   Cardiovascular:  Negative for chest pain and palpitations.  Gastrointestinal:  Negative for abdominal distention, abdominal pain and constipation.  Genitourinary: Negative.   Musculoskeletal: Negative.   Neurological:  Positive for light-headedness.  Psychiatric/Behavioral:  Negative for behavioral  problems and dysphoric mood.     Objective:  BP 134/82   Pulse 89   Temp 98 F (36.7 C) (Oral)   Ht 5\' 6"  (1.676 m)   Wt 152 lb 9.6 oz (69.2 kg)   SpO2 99%   BMI 24.63 kg/m      02/04/2023    2:41 PM 01/10/2023    3:01 PM 10/18/2022    9:28 AM  BP/Weight  Systolic BP 134 138 124  Diastolic BP 82 80 76  Wt. (Lbs) 152.6 152 151.8  BMI 24.63 kg/m2 24.53 kg/m2 23.78 kg/m2      Physical Exam Constitutional:      Appearance: She is well-developed.  Cardiovascular:     Rate and Rhythm: Normal rate.     Heart sounds: Normal heart sounds. No murmur heard. Pulmonary:     Effort: Pulmonary effort is normal.     Breath sounds: Normal breath sounds. No wheezing or rales.  Chest:     Chest wall: No tenderness.  Abdominal:     General: Bowel sounds are normal. There is no distension.     Palpations: Abdomen is soft. There is no mass.     Tenderness: There is no abdominal tenderness.  Musculoskeletal:        General: Normal range of motion.     Right lower leg: No edema.     Left lower leg: No edema.  Neurological:     Mental Status: She is alert and oriented to person, place, and time.  Psychiatric:        Mood and Affect: Mood normal.        Latest Ref Rng & Units 09/17/2022    4:06 AM 09/16/2022    3:07 PM 09/14/2022    4:45 PM  CMP  Glucose 70 - 99 mg/dL 161  096  045   BUN 8 - 23 mg/dL 12  13  15    Creatinine 0.44 - 1.00 mg/dL 4.09  8.11  9.14   Sodium 135 - 145 mmol/L 139  136  136   Potassium 3.5 - 5.1 mmol/L 3.9  3.5  3.6   Chloride 98 - 111 mmol/L 108  105  103  CO2 22 - 32 mmol/L 22  22  24    Calcium 8.9 - 10.3 mg/dL 8.6  8.3  8.9   Total Protein 6.5 - 8.1 g/dL  6.9  7.8   Total Bilirubin 0.3 - 1.2 mg/dL  0.4  0.8   Alkaline Phos 38 - 126 U/L  59  73   AST 15 - 41 U/L  34  25   ALT 0 - 44 U/L  19  17     Lipid Panel     Component Value Date/Time   CHOL 211 (H) 08/02/2022 1016   TRIG 81 08/02/2022 1016   HDL 75 08/02/2022 1016   CHOLHDL 2.4  04/18/2021 1600   CHOLHDL 2.9 08/26/2015 0945   VLDL 12 08/26/2015 0945   LDLCALC 122 (H) 08/02/2022 1016    CBC    Component Value Date/Time   WBC 3.3 (L) 09/17/2022 0406   RBC 5.04 09/17/2022 0406   HGB 13.3 09/17/2022 0406   HGB 14.4 08/02/2022 1016   HGB 12.8 11/18/2014 0902   HCT 41.3 09/17/2022 0406   HCT 42.9 08/02/2022 1016   HCT 38.9 11/18/2014 0902   PLT 201 09/17/2022 0406   PLT 281 08/02/2022 1016   MCV 81.9 09/17/2022 0406   MCV 79 08/02/2022 1016   MCV 82.4 11/18/2014 0902   MCH 26.4 09/17/2022 0406   MCHC 32.2 09/17/2022 0406   RDW 14.6 09/17/2022 0406   RDW 13.3 08/02/2022 1016   RDW 13.2 11/18/2014 0902   LYMPHSABS 1.2 09/17/2022 0406   LYMPHSABS 2.2 08/02/2022 1016   LYMPHSABS 0.9 11/18/2014 0902   MONOABS 0.2 09/17/2022 0406   MONOABS 0.3 11/18/2014 0902   EOSABS 0.0 09/17/2022 0406   EOSABS 0.0 08/02/2022 1016   BASOSABS 0.0 09/17/2022 0406   BASOSABS 0.0 08/02/2022 1016   BASOSABS 0.0 11/18/2014 0902    Lab Results  Component Value Date   HGBA1C 6.1 (H) 08/02/2022    Assessment & Plan:      COPD -Currently on Stiolto and albuterol -Commended on smoking cessation -Follow-up with pulmonologist as scheduled.  Lightheadedness Reports of intermittent lightheadedness since quitting smoking five months ago. Symptoms are transient and patient is managing them well. -Continue current management strategy.  Peripheral Vascular Disease Improvement in symptoms with cilostazol therapy. No current plans for surgical intervention at the moment per vascular note per patient request -Continue cilostazol as prescribed. -Continue high intensity statin -Follow-up in three months as planned.  Hyperlipidemia Currently on atorvastatin 80mg  for cholesterol management. -Schedule fasting blood work for cholesterol check.  Nasal Congestion Reports of stuffy nose without mucus production. Previous use of nasal spray was helpful. -Prescribe nasal  spray. -Advise consistent use of allergy medication (Zyrtec, loratadine).           Meds ordered this encounter  Medications   amLODipine (NORVASC) 10 MG tablet    Sig: Take 1 tablet (10 mg total) by mouth daily.    Dispense:  90 tablet    Refill:  1   atorvastatin (LIPITOR) 80 MG tablet    Sig: Take 1 tablet (80 mg total) by mouth at bedtime.    Dispense:  90 tablet    Refill:  1   fluticasone (FLONASE) 50 MCG/ACT nasal spray    Sig: Place 2 sprays into both nostrils daily.    Dispense:  16 g    Refill:  6    Follow-up: Return in about 6 months (around 08/07/2023) for Chronic medical conditions.  Hoy Register, MD, FAAFP. [facility] [place], [place] [phone]   02/04/2023, 5:46 PM

## 2023-02-04 NOTE — Patient Instructions (Signed)
How to Perform a Sinus Rinse A sinus rinse is a home treatment that is used to rinse your sinuses with a germ-free (sterile) mixture of salt and water (saline solution). Sinuses are air-filled spaces in your skull that are behind the bones of your face and forehead. They open into your nasal cavity. A sinus rinse can help to clear mucus, dirt, dust, or pollen from your nasal cavity. You may do a sinus rinse when you have a cold, a virus, nasal allergy symptoms, a sinus infection, or stuffiness in your nose or sinuses. What are the risks? A sinus rinse is generally safe and effective. However, there are a few risks, which include: A burning sensation in your sinuses. This may happen if you do not make the saline solution as directed. Be sure to follow all directions when making the saline solution. Nasal irritation. Infection. This may be from unclean supplies or from contaminated water. Infection from contaminated water is rare, but possible. Do not do a sinus rinse if you have had ear or nasal surgery, ear infection, or plugged ears, unless recommended by your health care provider. Supplies needed: Saline solution or powder. Distilled or sterile water to mix with saline powder. You may use boiled and cooled tap water. Boil tap water for 5 minutes; cool until it is lukewarm. Use within 24 hours. Do not use regular tap water to mix with the saline solution. Neti pot or nasal rinse bottle. These supplies release the saline solution into your nose and through your sinuses. Neti pots and nasal rinse bottles can be purchased at [employer], a health food store, or online. How to perform a sinus rinse  Wash your hands with soap and water for at least 20 seconds. If soap and water are not available, use hand sanitizer. Wash your device according to the directions that came with the product and then dry it. Use the solution that comes with your product or one that is sold separately in stores.  Follow the mixing directions on the package to mix with sterile or distilled water. Fill the device with the amount of saline solution noted in the device instructions. Stand by a sink and tilt your head sideways over the sink. Place the spout of the device in your upper nostril (the one closer to the ceiling). Gently pour or squeeze the saline solution into your nasal cavity. The liquid should drain out from the lower nostril if you are not too congested. While rinsing, breathe through your open mouth. Gently blow your nose to clear any mucus and rinse solution. Blowing too hard may cause ear pain. Turn your head in the other direction and repeat in your other nostril. Clean and rinse your device with clean water and then air-dry it. Talk with your health care provider or pharmacist if you have questions about how to do a sinus rinse. Summary A sinus rinse is a home treatment that is used to rinse your sinuses with a sterile mixture of salt and water (saline solution). You may do a sinus rinse when you have a cold, a virus, nasal allergy symptoms, a sinus infection, or stuffiness in your nose or sinuses. A sinus rinse is generally safe and effective. Follow all instructions carefully. This information is not intended to replace advice given to you by your health care provider. Make sure you discuss any questions you have with your health care provider. Document Revised: 12/05/2020 Document Reviewed: 12/05/2020 Elsevier Patient Education  2024 [employer].

## 2023-02-08 ENCOUNTER — Ambulatory Visit: Payer: Medicare HMO | Attending: Family Medicine

## 2023-02-08 DIAGNOSIS — I10 Essential (primary) hypertension: Secondary | ICD-10-CM

## 2023-02-08 DIAGNOSIS — R7303 Prediabetes: Secondary | ICD-10-CM

## 2023-02-21 ENCOUNTER — Ambulatory Visit (INDEPENDENT_AMBULATORY_CARE_PROVIDER_SITE_OTHER): Payer: Medicare HMO | Admitting: Nurse Practitioner

## 2023-02-21 ENCOUNTER — Other Ambulatory Visit: Payer: Self-pay

## 2023-02-21 ENCOUNTER — Encounter: Payer: Self-pay | Admitting: Nurse Practitioner

## 2023-02-21 VITALS — BP 138/88 | HR 84 | Ht 66.0 in | Wt 154.4 lb

## 2023-02-21 DIAGNOSIS — J449 Chronic obstructive pulmonary disease, unspecified: Secondary | ICD-10-CM

## 2023-02-21 DIAGNOSIS — R918 Other nonspecific abnormal finding of lung field: Secondary | ICD-10-CM | POA: Diagnosis not present

## 2023-02-21 MED ORDER — STIOLTO RESPIMAT 2.5-2.5 MCG/ACT IN AERS
2.0000 | INHALATION_SPRAY | Freq: Every day | RESPIRATORY_TRACT | 6 refills | Status: AC
Start: 2023-02-21 — End: ?
  Filled 2023-02-21: qty 4, 30d supply, fill #0
  Filled 2023-03-14: qty 4, 25d supply, fill #0
  Filled 2023-03-27: qty 4, 28d supply, fill #0

## 2023-02-21 NOTE — Progress Notes (Signed)
@Patient  ID: Martha Koch, female    DOB: 04-May-1956, 67 y.o.   MRN: [id number]  Chief Complaint  Patient presents with   Follow-up    Pt is here for COPD/Pulmonary Nodules F/U visit. Pt states she has no problems today.    Referring provider: Hoy Register, MD  HPI: 67 year old female, former smoker followed for severe COPD.  She is a patient of Dr. Kavin Leech and last seen in office 01/10/2023.  Past medical history significant for hypertension, history of cervical cancer, prediabetes.  TEST/EVENTS:  11/09/2022 CT chest without contrast: Mild atherosclerosis.  Previous opacities and nodule have resolved.  Advanced emphysema. 12/26/2022 PFT: FVC 71, FEV1 56, ratio 61, TLC 81, DLCO 29.  No BD  01/10/2023: OV with Dr. Delton Coombes.  Previously seen posthospitalization for CAP and COPD exacerbation back in April 2024.  Not currently on maintenance BD therapy.  Underwent pulmonary function testing which showed severe obstructive lung disease.  Also had repeat CT scan of the chest to ensure that her Fully cleared with resolution of bilateral pulmonary opacities and 6 mm left lower lobe nodular opacity.  Functional capacity is limited by her PVD, lower extremity weakness.  Does get some exertional shortness of breath.  Recommended starting her on Stiolto to see if she has any perceived benefit.  Can return to the lung cancer screening program.  02/21/2023: Today-follow-up Patient resents today for follow-up after being started on Stiolto for maintenance therapy for severe obstructive lung disease.  She tells me that she has been doing relatively well.  Not having any significant cough or wheezing.  She feels like her breathing does not limit her activities.  She does feel like this still does help.  She has not had to use the albuterol rescue inhaler since she started on this.  Feels like her chest little bit more open on it.  Allergies  Allergen Reactions   Aspirin     Stomach cramps   Penicillins  Hives    Did it involve swelling of the face/tongue/throat, SOB, or low BP? No Did it involve sudden or severe rash/hives, skin peeling, or any reaction on the inside of your mouth or nose? Yes Did you need to seek medical attention at a hospital or doctor's office? Yes When did it last happen?    Several Years Ago   If all above answers are "NO", may proceed with cephalosporin use.      Immunization History  Administered Date(s) Administered   Fluad Quad(high Dose 65+) 08/08/2022   Influenza,inj,Quad PF,6+ Mos 03/02/2014, 04/02/2016   Influenza-Unspecified 03/02/2017, 04/29/2020   PNEUMOCOCCAL CONJUGATE-20 08/08/2022   PPD Test 08/27/2018   Tdap 08/26/2015    Past Medical History:  Diagnosis Date   Cervical cancer (HCC) cervical   Hyperlipidemia    Hypertension    Radiation 06/02/14-07/13/14   pelvis 45 gray, vaginal cuff/parametrial boost to 50.4 gray   Radiation 04/06/15-05/13/15   anterior abdominal wall 50.4 gray    Tobacco History: Social History   Tobacco Use  Smoking Status Former   Current packs/day: 0.00   Average packs/day: 0.5 packs/day for 30.0 years (15.0 ttl pk-yrs)   Types: Cigarettes   Start date: 09/11/1992   Quit date: 09/12/2022   Years since quitting: 0.4  Smokeless Tobacco Never   Counseling given: Not Answered   Outpatient Medications Prior to Visit  Medication Sig Dispense Refill   albuterol (PROVENTIL) (2.5 MG/3ML) 0.083% nebulizer solution Take 3 mLs (2.5 mg total) by nebulization every  6 (six) hours as needed for wheezing or shortness of breath. 180 mL 1   albuterol (VENTOLIN HFA) 108 (90 Base) MCG/ACT inhaler Inhale 2 puffs into the lungs every 6 (six) hours as needed for wheezing or shortness of breath. 18 g 2   alendronate (FOSAMAX) 70 MG tablet Take 1 tablet (70 mg total) by mouth every 7 (seven) days. Take with a full glass of water on an empty stomach. 12 tablet 1   amLODipine (NORVASC) 10 MG tablet Take 1 tablet (10 mg total) by mouth  daily. 90 tablet 1   atorvastatin (LIPITOR) 80 MG tablet Take 1 tablet (80 mg total) by mouth at bedtime. 90 tablet 1   cetirizine (ZYRTEC) 10 MG tablet Take 1 tablet (10 mg total) by mouth daily. (Patient taking differently: Take 10 mg by mouth daily as needed for allergies.) 100 tablet 1   cholecalciferol 25 MCG (1000 UT) tablet Take 1,000 Units by mouth daily.     cilostazol (PLETAL) 100 MG tablet Take 1 tablet (100 mg total) by mouth 2 (two) times daily. 180 tablet 3   fluticasone (FLONASE) 50 MCG/ACT nasal spray Place 2 sprays into both nostrils daily. 16 g 6   naproxen (NAPROSYN) 500 MG tablet Take 1 tablet (500 mg total) by mouth 2 times daily with meals for 30 days then reduce to 1 tablet (500 mg total) by mouth nightly. (Patient taking differently: Take 500 mg by mouth daily as needed for moderate pain.) 60 tablet 5   Tiotropium Bromide-Olodaterol (STIOLTO RESPIMAT) 2.5-2.5 MCG/ACT AERS Inhale 2 puffs into the lungs daily. 4 g 0   No facility-administered medications prior to visit.     Review of Systems:   Constitutional: No weight loss or gain, night sweats, fevers, chills, fatigue, or lassitude. HEENT: No headaches, difficulty swallowing, tooth/dental problems, or sore throat. No sneezing, itching, ear ache, nasal congestion, or post nasal drip CV:  No chest pain, orthopnea, PND, swelling in lower extremities, anasarca, dizziness, palpitations, syncope Resp: +minimal shortness of breath with exertion. No excess mucus or change in color of mucus. No productive or non-productive. No hemoptysis. No wheezing.  No chest wall deformity GI:  No heartburn, indigestion GU: No dysuria, change in color of urine, urgency or frequency.   Skin: No rash, lesions, ulcerations MSK:  No joint pain or swelling.  Neuro: No dizziness or lightheadedness.  Psych: No depression or anxiety. Mood stable.     Physical Exam:  BP 138/88 (BP Location: Right Arm, Cuff Size: Normal)   Pulse 84   Ht 5\' 6"   (1.676 m)   Wt 154 lb 6.4 oz (70 kg)   SpO2 97%   BMI 24.92 kg/m   GEN: Pleasant, interactive, well-appearing; in no acute distress. HEENT:  Normocephalic and atraumatic. PERRLA. Sclera white. Nasal turbinates pink, moist and patent bilaterally. No rhinorrhea present. Oropharynx pink and moist, without exudate or edema. No lesions, ulcerations, or postnasal drip.  NECK:  Supple w/ fair ROM. No JVD present. Normal carotid impulses w/o bruits. Thyroid symmetrical with no goiter or nodules palpated. No lymphadenopathy.   CV: RRR, no m/r/g, no peripheral edema. Pulses intact, +2 bilaterally. No cyanosis, pallor or clubbing. PULMONARY:  Unlabored, regular breathing. Clear bilaterally A&P w/o wheezes/rales/rhonchi. No accessory muscle use.  GI: BS present and normoactive. Soft, non-tender to palpation. No organomegaly or masses detected.  MSK: No erythema, warmth or tenderness. Cap refil <2 sec all extrem. No deformities or joint swelling noted.  Neuro: A/Ox3. No focal deficits  noted.   Skin: Warm, no lesions or rashe Psych: Normal affect and behavior. Judgement and thought content appropriate.     Lab Results:  CBC    Component Value Date/Time   WBC 3.3 (L) 09/17/2022 0406   RBC 5.04 09/17/2022 0406   HGB 13.3 09/17/2022 0406   HGB 14.4 08/02/2022 1016   HGB 12.8 11/18/2014 0902   HCT 41.3 09/17/2022 0406   HCT 42.9 08/02/2022 1016   HCT 38.9 11/18/2014 0902   PLT 201 09/17/2022 0406   PLT 281 08/02/2022 1016   MCV 81.9 09/17/2022 0406   MCV 79 08/02/2022 1016   MCV 82.4 11/18/2014 0902   MCH 26.4 09/17/2022 0406   MCHC 32.2 09/17/2022 0406   RDW 14.6 09/17/2022 0406   RDW 13.3 08/02/2022 1016   RDW 13.2 11/18/2014 0902   LYMPHSABS 1.2 09/17/2022 0406   LYMPHSABS 2.2 08/02/2022 1016   LYMPHSABS 0.9 11/18/2014 0902   MONOABS 0.2 09/17/2022 0406   MONOABS 0.3 11/18/2014 0902   EOSABS 0.0 09/17/2022 0406   EOSABS 0.0 08/02/2022 1016   BASOSABS 0.0 09/17/2022 0406   BASOSABS  0.0 08/02/2022 1016   BASOSABS 0.0 11/18/2014 0902    BMET    Component Value Date/Time   NA 144 02/08/2023 1105   NA 145 09/23/2015 1136   K 4.3 02/08/2023 1105   K 4.7 09/23/2015 1136   CL 107 (H) 02/08/2023 1105   CO2 22 02/08/2023 1105   CO2 27 09/23/2015 1136   GLUCOSE 124 (H) 02/08/2023 1105   GLUCOSE 149 (H) 09/17/2022 0406   GLUCOSE 115 09/23/2015 1136   BUN 9 02/08/2023 1105   BUN 9.7 09/23/2015 1136   CREATININE 0.87 02/08/2023 1105   CREATININE 0.9 09/23/2015 1136   CALCIUM 9.5 02/08/2023 1105   CALCIUM 9.9 09/23/2015 1136   GFRNONAA >60 09/17/2022 0406   GFRAA 72 05/20/2020 0910    BNP No results found for: "BNP"   Imaging:  MM 3D SCREENING MAMMOGRAM BILATERAL BREAST  Result Date: 02/04/2023 CLINICAL DATA:  Screening. EXAM: DIGITAL SCREENING BILATERAL MAMMOGRAM WITH TOMOSYNTHESIS AND CAD TECHNIQUE: Bilateral screening digital craniocaudal and mediolateral oblique mammograms were obtained. Bilateral screening digital breast tomosynthesis was performed. The images were evaluated with computer-aided detection. COMPARISON:  Previous exam(s). ACR Breast Density Category c: The breasts are heterogeneously dense, which may obscure small masses. FINDINGS: There are no findings suspicious for malignancy. IMPRESSION: No mammographic evidence of malignancy. A result letter of this screening mammogram will be mailed directly to the patient. RECOMMENDATION: Screening mammogram in one year. (Code:SM-B-01Y) BI-RADS CATEGORY  1: Negative. Electronically Signed   By: Sherian Rein M.D.   On: 02/04/2023 13:27    Administration History     None          Latest Ref Rng & Units 12/26/2022    8:57 AM 02/29/2016    8:56 AM  PFT Results  FVC-Pre L 2.50  2.69   FVC-Predicted Pre % 71  90   FVC-Post L 2.62  2.49   FVC-Predicted Post % 75  83   Pre FEV1/FVC % % 60  66   Post FEV1/FCV % % 61  70   FEV1-Pre L 1.49  1.79   FEV1-Predicted Pre % 56  76   FEV1-Post L 1.61  1.75    DLCO uncorrected ml/min/mmHg 6.42  10.45   DLCO UNC% % 29  36   DLVA Predicted % 42  51   TLC L 4.49  4.48  TLC % Predicted % 81  81   RV % Predicted % 80  80     No results found for: "NITRICOXIDE"      Assessment & Plan:   COPD (chronic obstructive pulmonary disease) (HCC) Severe COPD; improved dyspnea with initiation of maintenance therapy. She will continue Stiolto. Compensated on current regimen. Action plan in place. Encouraged to remain smoke free.  Patient Instructions  Continue Albuterol inhaler 2 puffs or 3 mL neb every 6 hours as needed for shortness of breath or wheezing. Notify if symptoms persist despite rescue inhaler/neb use.  Continue stiolto 2 puffs daily  Continue zyrtec 1 tab daily  Continue flonase nasal spray daily  Next lung cancer screening CT chest due May 2025  Follow up in 6 months with Dr. Delton Coombes. If symptoms worsen, please contact office for sooner follow up or seek emergency care.    Pulmonary nodules Resolved on recent CT. Likely infectious/inflammatory. Return to lung cancer screening program.    I spent 25 minutes of dedicated to the care of this patient on the date of this encounter to include pre-visit review of records, face-to-face time with the patient discussing conditions above, post visit ordering of testing, clinical documentation with the electronic health record, making appropriate referrals as documented, and communicating necessary findings to members of the patients care team.  Noemi Chapel, NP 02/21/2023  Pt aware and understands NP's role.

## 2023-02-21 NOTE — Assessment & Plan Note (Signed)
Resolved on recent CT. Likely infectious/inflammatory. Return to lung cancer screening program.

## 2023-02-21 NOTE — Patient Instructions (Addendum)
Continue Albuterol inhaler 2 puffs or 3 mL neb every 6 hours as needed for shortness of breath or wheezing. Notify if symptoms persist despite rescue inhaler/neb use.  Continue stiolto 2 puffs daily  Continue zyrtec 1 tab daily  Continue flonase nasal spray daily  Next lung cancer screening CT chest due May 2025  Follow up in 6 months with Dr. Delton Coombes. If symptoms worsen, please contact office for sooner follow up or seek emergency care.

## 2023-02-21 NOTE — Assessment & Plan Note (Signed)
Severe COPD; improved dyspnea with initiation of maintenance therapy. She will continue Stiolto. Compensated on current regimen. Action plan in place. Encouraged to remain smoke free.  Patient Instructions  Continue Albuterol inhaler 2 puffs or 3 mL neb every 6 hours as needed for shortness of breath or wheezing. Notify if symptoms persist despite rescue inhaler/neb use.  Continue stiolto 2 puffs daily  Continue zyrtec 1 tab daily  Continue flonase nasal spray daily  Next lung cancer screening CT chest due May 2025  Follow up in 6 months with Dr. Delton Coombes. If symptoms worsen, please contact office for sooner follow up or seek emergency care.

## 2023-02-28 ENCOUNTER — Other Ambulatory Visit: Payer: Self-pay

## 2023-03-11 ENCOUNTER — Other Ambulatory Visit: Payer: Self-pay

## 2023-03-11 DIAGNOSIS — I739 Peripheral vascular disease, unspecified: Secondary | ICD-10-CM | POA: Diagnosis not present

## 2023-03-11 MED ORDER — ATORVASTATIN CALCIUM 80 MG PO TABS
80.0000 mg | ORAL_TABLET | Freq: Every day | ORAL | 3 refills | Status: DC
  Filled 2023-03-11: qty 90, 90d supply, fill #0
  Filled 2023-06-20: qty 90, 90d supply, fill #1

## 2023-03-11 MED ORDER — CILOSTAZOL 100 MG PO TABS
100.0000 mg | ORAL_TABLET | Freq: Two times a day (BID) | ORAL | 3 refills | Status: DC
  Filled 2023-03-11: qty 180, 90d supply, fill #0

## 2023-03-12 ENCOUNTER — Other Ambulatory Visit: Payer: Self-pay

## 2023-03-13 ENCOUNTER — Ambulatory Visit: Payer: Self-pay | Admitting: *Deleted

## 2023-03-13 NOTE — Telephone Encounter (Signed)
  Chief Complaint: Dime sized growth over the top of her left ear. Symptoms: Over the past week it is getting larger and is becoming sore and red.   It's been a bump that been there for years and has never been a problem. Frequency: Sore over the last week Pertinent Negatives: Patient denies that it is draining.    It tingles.   Doesn't really hurt but it is getting sore. Disposition: [] ED /[] Urgent Care (no appt availability in office) / [x] Appointment(In office/virtual)/ []  [facility]/ [] Home Care/ [] Refused Recommended Disposition /[] [facility]/ [x]  Follow-up with PCP Additional Notes: There are no appts with [facility] until Oct. 14 with any provider.   I'm sending a message to see if she can be worked in.   Pt. Agreeable to someone calling her back.

## 2023-03-13 NOTE — Telephone Encounter (Signed)
Message from Dinosaur E sent at 03/13/2023  2:34 PM EDT  Summary: Needs dermatology referral, no appts. Swelling   Pt has a growth above her right ear that is swelling and sore, she wants a dermatology referral  Best contact: [phone]          Call History  Contact Date/Time Type Contact Phone/Fax User  03/13/2023 02:33 PM EDT Phone ([address]) [place], [place] (Self) [phone] Judie Petit) Randol Kern   Reason for Disposition  [1] Boil AND [2] not improved > 3 days following Care Advice  Answer Assessment - Initial Assessment Questions 1. APPEARANCE of BOIL: "What does the boil look like?"      Growth over right ear.    It's been a bump but it's getting bigger and is sore.   I've had it for years as a bump.   2. LOCATION: "Where is the boil located?"      Over my right ear 3. NUMBER: "How many boils are there?"      One 4. SIZE: "How big is the boil?" (e.g., inches, cm; compare to size of a coin or other object)     Size of dime 5. ONSET: "When did the boil start?"     The bump has been there for years.  This week it started to get sore. 6. PAIN: "Is there any pain?" If Yes, ask: "How bad is the pain?"   (Scale 1-10; or mild, moderate, severe)     Yes It's sore 7. FEVER: "Do you have a fever?" If Yes, ask: "What is it, how was it measured, and when did it start?"      No 8. SOURCE: "Have you been around anyone with boils or other Staph infections?" "Have you ever had boils before?"     Not asked 9. OTHER SYMPTOMS: "Do you have any other symptoms?" (e.g., shaking chills, weakness, rash elsewhere on body)     Not asked 10. PREGNANCY: "Is there any chance you are pregnant?" "When was your last menstrual period?"       N/A due to age  Protocols used: Boil (Skin Abscess)-A-AH

## 2023-03-13 NOTE — Telephone Encounter (Signed)
Spoke with patient. Patient reports that she has had a raised area(bump) to left ear for a while. However, within the last week it has become red and it is very sore. Patient denies any drainage from the area at this time. Patient requesting to be seen as soon as possible. Patient  placed on our walk-in schedule for tomorrow. Patient is aware that she should be here by 8:30 am and she may have a wait , but she will be seen by one of our provider. Patient voiced understanding of all discussed .

## 2023-03-14 ENCOUNTER — Ambulatory Visit: Payer: Medicare HMO | Attending: Family Medicine | Admitting: Critical Care Medicine

## 2023-03-14 ENCOUNTER — Other Ambulatory Visit: Payer: Self-pay

## 2023-03-14 ENCOUNTER — Encounter: Payer: Self-pay | Admitting: Critical Care Medicine

## 2023-03-14 VITALS — BP 146/90 | HR 80 | Wt 159.4 lb

## 2023-03-14 DIAGNOSIS — R52 Pain, unspecified: Secondary | ICD-10-CM | POA: Diagnosis not present

## 2023-03-14 DIAGNOSIS — L0201 Cutaneous abscess of face: Secondary | ICD-10-CM | POA: Insufficient documentation

## 2023-03-14 DIAGNOSIS — D369 Benign neoplasm, unspecified site: Secondary | ICD-10-CM | POA: Diagnosis not present

## 2023-03-14 MED ORDER — DOXYCYCLINE HYCLATE 100 MG PO TABS
100.0000 mg | ORAL_TABLET | Freq: Two times a day (BID) | ORAL | 0 refills | Status: DC
  Filled 2023-03-14: qty 14, 7d supply, fill #0

## 2023-03-14 MED ORDER — ACETAMINOPHEN 325 MG PO TABS
325.0000 mg | ORAL_TABLET | Freq: Once | ORAL | Status: DC
Start: 2023-03-14 — End: 2023-03-14

## 2023-03-14 MED ORDER — ACETAMINOPHEN 325 MG PO TABS
650.0000 mg | ORAL_TABLET | Freq: Four times a day (QID) | ORAL | Status: AC | PRN
Start: 2023-03-14 — End: ?
  Administered 2023-03-14: 650 mg via ORAL

## 2023-03-14 NOTE — Progress Notes (Deleted)
Acute Office Visit  Subjective:     Patient ID: Martha Koch, female    DOB: 1955-12-30, 67 y.o.   MRN: [id number]  Chief Complaint  Patient presents with   Mass    Over right side ear and becoming sore    HPI  ROS      Objective:    BP (!) 146/90   Pulse 80   Wt 159 lb 6.4 oz (72.3 kg)   SpO2 98%   BMI 25.73 kg/m  {Vitals History (Optional):23777}  Physical Exam  No results found for any visits on 03/14/23.          Patient was placed in supine position and the right lesion was prepped in sterile manner and then Xylocaine was administered for anesthesia  A small incision was made and significant purulence was expressed  Dressings were applied topically patient tolerated the procedure fairly well but was in some degree of pain. 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 with purulence this was drained and topical dressing applied see procedure note  Patient was asked to use Tylenol Extra Strength 2-4 times a day as needed for pain Plan is to administer doxycycline 100 mg twice a day for 7 days and will need follow-up with primary care and is to call sooner if unimproved patient was given dressing supplies to change daily for 5 days      Relevant Orders   Ambulatory referral to General Surgery     Other   Dermoid cyst    Source of infection was a dermoid cyst will be referred to general surgery to have the cyst excised      Relevant Orders   Ambulatory referral to General Surgery   Other Visit Diagnoses     Pain       Relevant Medications   acetaminophen (TYLENOL) tablet 650 mg       Meds ordered this encounter  Medications   doxycycline (VIBRA-TABS) 100 MG tablet    Sig: Take 1 tablet (100 mg total) by mouth 2 (two) times daily.    Dispense:  14 tablet    Refill:  0   DISCONTD: acetaminophen  (TYLENOL) tablet 325 mg   acetaminophen (TYLENOL) tablet 650 mg  35 minutes seen assessing patient and performing a necessary procedure to drain the abscess dermoid cyst  Return if symptoms worsen or fail to improve, for primary care follow up.  Shan Levans, MD

## 2023-03-14 NOTE — Patient Instructions (Signed)
Take doxycycline one twice daily Take tylenol two 500mg  4 times daily as needed for pain Ref to general surgery Dr Darnell Level will be made to remove cyst

## 2023-03-14 NOTE — Progress Notes (Signed)
error 

## 2023-03-15 NOTE — Progress Notes (Unsigned)
Acute Office Visit  Subjective:     Patient ID: Martha Koch, female    DOB: 02-Oct-1955, 67 y.o.   MRN: [id number]  Chief Complaint  Patient presents with   Mass    Over right side ear and becoming sore    HPI Patient endorses bump being in area "for years" but it has enlarged in the past week. Patient says area was originally "a bump but it has grown to dime-sized in a week."Area is also painful to touch. Patient denies trauma to area. She rates her pain at 6/10. He pain is worse when pressure is applied to the site. She says she is unable to sleep on her side due to pain. She has not used any treatments to ease pain or swelling.  Review of Systems  Constitutional: Negative.   HENT: Negative.    Respiratory: Negative.    Cardiovascular: Negative.         Objective:    BP (!) 146/90   Pulse 80   Wt 159 lb 6.4 oz (72.3 kg)   SpO2 98%   BMI 25.73 kg/m    Physical Exam Constitutional:      Appearance: Normal appearance.  Cardiovascular:     Rate and Rhythm: Normal rate and regular rhythm.  Pulmonary:     Effort: Pulmonary effort is normal.     Breath sounds: Normal breath sounds.  Skin:    General: Skin is warm.     Findings: Lesion present.     Comments: Purulent after incision made area. area erythematous. No drainage noted prior to incision.  Neurological:     Mental Status: She is alert.    Procedure note: After patient placed in supine position, lesion on right face was prepped in sterile fashion. I% lidocaine was administered for local anesthesia. When small incision was made, a significant amount of purulent drainage was expressed. Dressings were applied to site. Patient tolerated procedure fairly well. She reports having a small amount of pain after procedure.     No results found for any visits on 03/14/23.      Assessment & Plan:   Problem List Items Addressed This Visit      I personally reviewed all images and lab data in the [facility] system  as well as any outside material available during this office visit and agree with the  radiology impressions.   Cutaneous abscess of face Dermoid cyst anterior to right ear. Cyst was noted to have purulent drainage. Topical dressing was applied. See procedure note.  Patient will use extra strength Acetaminophen for 2-4 times daily prn pain.   Plan is to prescribe Doxycycline 100 mg twice a day for 7 days.  Pt needs to follow-up with primary care and prn if not improved.  Patient given supplies for daily dressing change for 5 days.   Dermoid cyst Dermoid cyst is source of infection. Referral placed to general surgery for cyst excision    [name] was seen today for mass.  Diagnoses and all orders for this visit:  Cutaneous abscess of face -     Ambulatory referral to General Surgery  Dermoid cyst -     Ambulatory referral to General Surgery  Pain -     Discontinue: acetaminophen (TYLENOL) tablet 325 mg -     acetaminophen (TYLENOL) tablet 650 mg  Other orders -     doxycycline (VIBRA-TABS) 100 MG tablet; Take 1 tablet (100 mg total) by mouth 2 (two) times daily.  Meds ordered this encounter  Medications   doxycycline (VIBRA-TABS) 100 MG tablet    Sig: Take 1 tablet (100 mg total) by mouth 2 (two) times daily.    Dispense:  14 tablet    Refill:  0   DISCONTD: acetaminophen (TYLENOL) tablet 325 mg   acetaminophen (TYLENOL) tablet 650 mg    Return if symptoms worsen or fail to improve, for primary care follow up.  Shan Levans, MD Burnard Bunting, SFNP, [facility]  I have seen and examined this patient with the student mid-level provider and agree with the above note . Shan Levans

## 2023-03-17 ENCOUNTER — Encounter: Payer: Self-pay | Admitting: Critical Care Medicine

## 2023-03-17 NOTE — Assessment & Plan Note (Signed)
Source of infection was a dermoid cyst will be referred to general surgery to have the cyst excised

## 2023-03-17 NOTE — Assessment & Plan Note (Addendum)
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

## 2023-03-19 ENCOUNTER — Ambulatory Visit: Payer: Self-pay | Admitting: *Deleted

## 2023-03-19 ENCOUNTER — Telehealth: Payer: Self-pay

## 2023-03-19 DIAGNOSIS — D369 Benign neoplasm, unspecified site: Secondary | ICD-10-CM

## 2023-03-19 DIAGNOSIS — L0201 Cutaneous abscess of face: Secondary | ICD-10-CM

## 2023-03-19 NOTE — Telephone Encounter (Signed)
Summary: cyst that she has above her right ear draining   Pt stated Dr. Shan Levans drained and cleaned the cyst that she has above her right ear, and today she started again with the cyst draining. Pt is asking if this is normal.  FYI to NT Pt mentioned a referral was sent by Dr.Wright to [facility], and they stated they don't do surgeries near the face. Pt stated its above her ear on the right side. A message has been sent to the office regarding referral.  Seeking clinical advice.       Chief Complaint: Drainage Symptoms: Brownish drainage this AM "A lot." No further since AM. States area is not longer tender to touch. Frequency: Today Pertinent Negatives: Patient denies fever, redness to site Disposition: [] ED /[] Urgent Care (no appt availability in office) / [] Appointment(In office/virtual)/ []  [place] [facility]/ [] Home Care/ [] Refused Recommended Disposition /[] [place] [facility]/ [x]  Follow-up with PCP Additional Notes: Pt states area had not drained since 03/14/23 when first drained. Wants to ensure this is normal. Assured pt NT would route to practice for PCPs review. Also please see note regarding referral.  Reason for Disposition  [1] Caller has NON-URGENT question AND [2] triager unable to answer question  Answer Assessment - Initial Assessment Questions 1. SYMPTOM: "What's the main symptom you're concerned about?" (e.g., drainage, incision opening up, pain, redness)     Drainage 2. ONSET: "When did   start?"     YToday 3. SURGERY: "What surgery did you have?"     Cyst drained  4. DATE of SURGERY: "When was the surgery?"      03/14/23 5. INCISION SITE: "Where is the incision located?"      Right ear 6. REDNESS: "Is there any redness at the incision site?" If Yes, ask: "How wide across is the redness?" (Inches, centimeters)      no 7. PAIN: "Is there any pain?" If Yes, ask: "How bad is it?"  (Scale 1-10; or mild, moderate, severe)   - NONE (0):  no pain   - MILD (1-3): doesn't interfere with normal activities    - MODERATE (4-7): interferes with normal activities or awakens from sleep    - SEVERE (8-10): excruciating pain, unable to do any normal activities     no 8. BLEEDING: "Is there any bleeding?" If Yes, ask: "How much?" and "Where?"     *No Answer*no 9. DRAINAGE: "Is there any drainage from the incision site?" If Yes, ask: "What color and how much?" (e.g., red, cloudy, pus; drops, teaspoon)     Brownish 10. FEVER: "Do you have a fever?" If Yes, ask: "What is your temperature, how was it measured, and when did it start?"       No 11. OTHER SYMPTOMS: "Do you have any other symptoms?" (e.g., dizziness, rash elsewhere on body, shaking chills, weakness)       NO  Protocols used: Post-Op Incision Symptoms and Questions-A-AH

## 2023-03-19 NOTE — Telephone Encounter (Signed)
Copied from CRM [id number]. Topic: General - Inquiry >> Mar 19, 2023  1:46 PM [place] wrote: Reason for CRM: Pt stated [facility] stated they don't do surgeries near the face. Pt stated it was above her ear on the right side. Pt stated she needs a new referral.  Please advise. >> Mar 19, 2023  1:56 PM De Blanch wrote: Pt stated [facility] stated they don't do surgeries near the face. Pt stated cyst is above her ear on the right side. Pt stated she needs a new referral.  Please advise.

## 2023-03-19 NOTE — Addendum Note (Signed)
Addended by: Hoy Register on: 03/19/2023 04:52 PM   Modules accepted: Orders

## 2023-03-19 NOTE — Telephone Encounter (Signed)
Pt was seen by Dr.Wright and he placed referral to General Surgery

## 2023-03-19 NOTE — Telephone Encounter (Signed)
I have placed her dermatology referral.

## 2023-03-20 NOTE — Telephone Encounter (Signed)
Let patient know drainage is normal continue to change dressings daily and I will make referral to plastic surgery they will do this type of removal

## 2023-03-20 NOTE — Addendum Note (Signed)
Addended by: Shan Levans E on: 03/20/2023 12:24 PM   Modules accepted: Orders

## 2023-03-22 NOTE — Telephone Encounter (Signed)
Called patient and left voicemail.

## 2023-03-27 ENCOUNTER — Other Ambulatory Visit: Payer: Self-pay

## 2023-04-01 ENCOUNTER — Ambulatory Visit: Payer: Medicare HMO | Admitting: Plastic Surgery

## 2023-04-01 ENCOUNTER — Encounter: Payer: Self-pay | Admitting: Plastic Surgery

## 2023-04-01 VITALS — BP 129/82 | HR 103 | Wt 157.0 lb

## 2023-04-01 DIAGNOSIS — L989 Disorder of the skin and subcutaneous tissue, unspecified: Secondary | ICD-10-CM | POA: Diagnosis not present

## 2023-04-01 NOTE — Progress Notes (Signed)
Referring Provider Hoy Register, MD [address] [place],  [place] [address]   CC:  Chief Complaint  Patient presents with   Skin Problem      Martha Koch is an 67 y.o. female.  HPI: Martha Koch is a 67 year old female who is referred for evaluation and treatment of a cyst on the right side of her face in the temporal region.  She states the cyst has been there for approximately 5 years however recently she had increased swelling and pain.  She consulted her primary care physician who incised and drained the cyst noting that there was a significant amount of purulent fluid removed.  She would like to have definitive treatment.  Allergies  Allergen Reactions   Aspirin     Stomach cramps   Penicillins Hives    Did it involve swelling of the face/tongue/throat, SOB, or low BP? No Did it involve sudden or severe rash/hives, skin peeling, or any reaction on the inside of your mouth or nose? Yes Did you need to seek medical attention at a hospital or doctor's office? Yes When did it last happen?    Several Years Ago   If all above answers are "NO", may proceed with cephalosporin use.      Outpatient Encounter Medications as of 04/01/2023  Medication Sig   albuterol (PROVENTIL) (2.5 MG/3ML) 0.083% nebulizer solution Take 3 mLs (2.5 mg total) by nebulization every 6 (six) hours as needed for wheezing or shortness of breath.   albuterol (VENTOLIN HFA) 108 (90 Base) MCG/ACT inhaler Inhale 2 puffs into the lungs every 6 (six) hours as needed for wheezing or shortness of breath.   alendronate (FOSAMAX) 70 MG tablet Take 1 tablet (70 mg total) by mouth every 7 (seven) days. Take with a full glass of water on an empty stomach.   amLODipine (NORVASC) 10 MG tablet Take 1 tablet (10 mg total) by mouth daily.   aspirin EC 81 MG tablet Take by mouth.   atorvastatin (LIPITOR) 80 MG tablet Take 1 tablet (80 mg total) by mouth at bedtime.   cetirizine (ZYRTEC) 10 MG tablet Take  1 tablet (10 mg total) by mouth daily. (Patient taking differently: Take 10 mg by mouth daily as needed for allergies.)   cholecalciferol 25 MCG (1000 UT) tablet Take 1,000 Units by mouth daily.   cilostazol (PLETAL) 100 MG tablet Take 1 tablet (100 mg total) by mouth 2 (two) times daily.   doxycycline (VIBRA-TABS) 100 MG tablet Take 1 tablet (100 mg total) by mouth 2 (two) times daily.   fluticasone (FLONASE) 50 MCG/ACT nasal spray Place 2 sprays into both nostrils daily.   naproxen (NAPROSYN) 500 MG tablet Take 1 tablet (500 mg total) by mouth 2 times daily with meals for 30 days then reduce to 1 tablet (500 mg total) by mouth nightly. (Patient taking differently: Take 500 mg by mouth daily as needed for moderate pain.)   Tiotropium Bromide-Olodaterol (STIOLTO RESPIMAT) 2.5-2.5 MCG/ACT AERS Inhale 2 puffs into the lungs daily.   Facility-Administered Encounter Medications as of 04/01/2023  Medication   acetaminophen (TYLENOL) tablet 650 mg     Past Medical History:  Diagnosis Date   Cervical cancer (HCC) cervical   Hyperlipidemia    Hypertension    Radiation 06/02/14-07/13/14   pelvis 45 gray, vaginal cuff/parametrial boost to 50.4 gray   Radiation 04/06/15-05/13/15   anterior abdominal wall 50.4 gray    Past Surgical History:  Procedure Laterality Date  ABDOMINAL HYSTERECTOMY     COLONOSCOPY  2011   MUSCLE BIOPSY Right 09/14/2021   Procedure: MUSCLE BIOPSY RIGHT THIGH AND RIGHT LOWER LEG;  Surgeon: Almond Lint, MD;  Location: [facility] OR;  Service: General;  Laterality: Right;   robotic radical hysterectomy, bso, pelvic lymphadenectomy Bilateral 04/21/14   TUBAL LIGATION      Family History  Problem Relation Age of Onset   Hypertension Father    Breast cancer Maternal Aunt        breast cancer, x 4 aunts    Breast cancer Daughter    Cancer Other    Diabetes Other    Colon cancer Neg Hx    Colon polyps Neg Hx    Esophageal cancer Neg Hx    Stomach cancer Neg Hx    Rectal  cancer Neg Hx    Neuropathy Neg Hx     Social History   Social History Narrative   Has 3 children. They live in [place].   Has 3 grandchildren.    Martha Koch (oldest daughter)    Lives alone.      Review of Systems General: Denies fevers, chills, weight loss CV: Denies chest pain, shortness of breath, palpitations Skin: 1 cm cyst which is recently become infected and required drainage.  She notes a cyst has been there for 5 years without any significant change prior to the infection  Physical Exam    04/01/2023    8:57 AM 03/14/2023    9:58 AM 03/14/2023    8:47 AM  Vitals with BMI  Weight 157 lbs  159 lbs 6 oz  BMI   25.74  Systolic 129 146 161  Diastolic 82 90 88  Pulse 103  80    General:  No acute distress,  Alert and oriented, Non-Toxic, Normal speech and affect Integument: Some 1 cm incision on the right temporal region.  There is a small amount of exudate on the cyst.  There is no erythema or drainage today. Mammogram: August 2024 BI-RADS 1 Assessment/Plan Cyst: Patient has a cyst which has been present for approximately 5 years however it recently became infected.  The appearance and palpation of the cyst are consistent with an epidermal inclusion cyst.  I believe that this can be removed in the office under local anesthetic.  The patient understands she will have a scar and that there is a chance of recurrence.  I would like to wait at least 2 weeks to decrease the inflammation.  Will schedule her for excision in the office.  Santiago Glad 04/01/2023, 4:30 PM

## 2023-04-19 ENCOUNTER — Other Ambulatory Visit: Payer: Self-pay

## 2023-04-24 ENCOUNTER — Encounter: Payer: Self-pay | Admitting: Plastic Surgery

## 2023-04-24 ENCOUNTER — Ambulatory Visit: Payer: Medicare HMO | Admitting: Plastic Surgery

## 2023-04-24 VITALS — BP 124/82 | HR 117

## 2023-04-24 DIAGNOSIS — L720 Epidermal cyst: Secondary | ICD-10-CM | POA: Diagnosis not present

## 2023-04-24 DIAGNOSIS — L989 Disorder of the skin and subcutaneous tissue, unspecified: Secondary | ICD-10-CM

## 2023-04-24 NOTE — Progress Notes (Signed)
Procedure Note  Preoperative Dx: Cyst right temporal region  Postoperative Dx: Same  Procedure: Excision of cyst  Anesthesia: Lidocaine 1% with 1:100,000 epinephrine and 0.25% Sensorcaine   Indication for Procedure: Removal for pathologic diagnosis  Description of Procedure: Risks and complications were explained to the patient including infection and possible recurrence.  Consent was confirmed and the patient understands the risks and benefits.  The potential complications and alternatives were explained and the patient consents.  The patient expressed understanding the option of not having the procedure and the risks of a scar.  Time out was called and all information was confirmed to be correct.    The area was prepped and drapped.  Local anesthetic was injected in the subcutaneous tissues.  After waiting for the local to take affect an elliptical incision was made at the borders of the cyst.  Dissection was carried out down to healthy appearing tissue at the base of the cyst and on the lateral borders.  After obtaining hemostasis, the surgical wound was closed interrupted 3-0 Monocryl sutures in the deep tissues, interrupted 4-0 Monocryl in the dermis and interrupted 5-0 Prolene sutures and the skin.  The surgical wound measured 2 cm.  A dressing was applied.  The patient was given instructions on how to care for the area and a follow up appointment.  Janayla tolerated the procedure well and there were no complications. The specimen was sent to pathology.

## 2023-04-29 ENCOUNTER — Encounter: Payer: Self-pay | Admitting: Family Medicine

## 2023-04-29 LAB — DERMATOLOGY PATHOLOGY

## 2023-04-30 ENCOUNTER — Ambulatory Visit: Payer: Medicare HMO | Admitting: Student

## 2023-04-30 ENCOUNTER — Other Ambulatory Visit: Payer: Self-pay

## 2023-04-30 ENCOUNTER — Other Ambulatory Visit: Payer: Self-pay | Admitting: Family Medicine

## 2023-04-30 DIAGNOSIS — L989 Disorder of the skin and subcutaneous tissue, unspecified: Secondary | ICD-10-CM

## 2023-04-30 MED ORDER — TRAZODONE HCL 50 MG PO TABS
25.0000 mg | ORAL_TABLET | Freq: Every evening | ORAL | 3 refills | Status: DC | PRN
Start: 2023-04-30 — End: 2023-05-09
  Filled 2023-04-30: qty 30, 30d supply, fill #0

## 2023-04-30 NOTE — Progress Notes (Signed)
Patient is a 67 year old female who underwent excision of the cyst to the right temporal region with Dr. Ladona Ridgel on 04/24/2023.  She is almost 1 week postop.  During the procedure, cyst was excised and the deep tissues were closed with 4-0 Monocryl in the dermis and 5-0 Prolene sutures in the skin.  Specimen was sent to pathology.  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  Patient presents to the clinic today for postprocedural follow-up.  Today, patient reports she is doing well.  She denies any issues with the procedure site.  Denies any fevers or chills.  Denies any drainage.  Discussed the results of the pathology with the patient.  Patient expressed understanding.  On exam, patient is sitting upright in no acute distress.  Incision appears to be intact with Prolene sutures.  There is no surrounding erythema.  No drainage.  No swelling.  Prolene sutures were removed without any difficulty.  Patient tolerated well.  Recommended the patient that she apply Vaseline to the incision for another week or so, then she may transition to scar creams if she would like.  Discussed with patient to avoid direct sunlight to the scar as this can worsen the scar.  Patient stressed understanding.  Patient to follow back up as needed.  Instructed the patient to call if she has any questions or concerns about anything.

## 2023-05-08 DIAGNOSIS — I739 Peripheral vascular disease, unspecified: Secondary | ICD-10-CM | POA: Diagnosis not present

## 2023-05-08 DIAGNOSIS — I700 Atherosclerosis of aorta: Secondary | ICD-10-CM | POA: Diagnosis not present

## 2023-05-08 DIAGNOSIS — R0609 Other forms of dyspnea: Secondary | ICD-10-CM | POA: Diagnosis not present

## 2023-05-08 DIAGNOSIS — I10 Essential (primary) hypertension: Secondary | ICD-10-CM | POA: Diagnosis not present

## 2023-05-08 DIAGNOSIS — Z7902 Long term (current) use of antithrombotics/antiplatelets: Secondary | ICD-10-CM | POA: Diagnosis not present

## 2023-05-08 DIAGNOSIS — Z87891 Personal history of nicotine dependence: Secondary | ICD-10-CM | POA: Diagnosis not present

## 2023-05-08 DIAGNOSIS — E785 Hyperlipidemia, unspecified: Secondary | ICD-10-CM | POA: Diagnosis not present

## 2023-05-09 ENCOUNTER — Encounter: Payer: Self-pay | Admitting: Emergency Medicine

## 2023-05-09 ENCOUNTER — Ambulatory Visit: Payer: Medicare HMO | Admitting: Emergency Medicine

## 2023-05-09 VITALS — BP 130/88 | HR 109 | Temp 98.2°F | Ht 66.0 in | Wt 152.8 lb

## 2023-05-09 DIAGNOSIS — R000 Tachycardia, unspecified: Secondary | ICD-10-CM | POA: Diagnosis not present

## 2023-05-09 DIAGNOSIS — Z87891 Personal history of nicotine dependence: Secondary | ICD-10-CM | POA: Diagnosis not present

## 2023-05-09 DIAGNOSIS — G4700 Insomnia, unspecified: Secondary | ICD-10-CM | POA: Diagnosis not present

## 2023-05-09 DIAGNOSIS — J449 Chronic obstructive pulmonary disease, unspecified: Secondary | ICD-10-CM | POA: Diagnosis not present

## 2023-05-09 NOTE — Assessment & Plan Note (Signed)
Lung Cancer Screening History of tobacco use and COPD places patient at high risk. -Schedule next screening CT chest for May 2025.

## 2023-05-09 NOTE — Assessment & Plan Note (Signed)
Tachycardia Patient reports elevated heart rate, particularly with exertion. Cardiology consultation already scheduled. -Continue with planned cardiology consultation in December 2024.

## 2023-05-09 NOTE — Assessment & Plan Note (Signed)
Insomnia Patient reports difficulty falling asleep and staying asleep, leading to feeling unrested in the morning. No current sleep aids being used. -Consider evaluation for potential sleep disorders if insomnia persists.

## 2023-05-09 NOTE — Progress Notes (Signed)
Subjective:    Patient ID: Martha Koch, female    DOB: Dec 11, 1955, 67 y.o.   MRN: [id number]  HPI  CT chest 11/09/2022 reviewed by me, shows interval resolution of her bilateral pulmonary opacities including the 6 mm left lower lobe nodular opacity.  Her lymphadenopathy resolved as well.  No significant nodules.    Pulmonary function testing performed 12/26/2022 reviewed by me, shows severe obstruction with an FEV1 1.49 L (56% predicted), normal lung volumes, significantly decreased diffusion capacity.  Her FEV1 in 2017 was 1.79 L (76% predicted)  ROV 05/09/2023 --  The patient, a 67 year old with a history of tobacco use, COPD, hypertension, hyperlipidemia, and cervical cancer, presents for follow-up after a recent pneumonia episode in April 2024. The patient had a CT chest in May 2024 and pulmonary function testing in June 2024, which showed severe obstruction with an FEV1 of 1.49 liters or 56% predicted. The patient was started on Stiolto in July 2024 and has since quit smoking.  The patient reports feeling tired and short of breath upon waking in the morning, which continues throughout the day. She describes it as her body taking more energy to move and needing to take deep breaths. The patient has been taking Stiolto in the morning and feels it may be helping, but still experiences these symptoms. She also has a short-acting albuterol inhaler for use as needed, but has not felt the need to use it.  The patient denies any wheezing, coughing, or chest pain. She reports no current smoking and has no intention to start again. She also mentions having trouble sleeping, staying awake all night, and not feeling well-rested in the morning. The patient denies snoring and is not currently on any sleep aids. She also mentions a recent episode of what she believes was acid reflux, but does not take any regular medication for it.    Review of Systems As per HPI  Past Medical History:  Diagnosis  Date   Cervical cancer (HCC) cervical   Hyperlipidemia    Hypertension    Radiation 06/02/14-07/13/14   pelvis 45 gray, vaginal cuff/parametrial boost to 50.4 gray   Radiation 04/06/15-05/13/15   anterior abdominal wall 50.4 gray     Family History  Problem Relation Age of Onset   Hypertension Father    Breast cancer Maternal Aunt        breast cancer, x 4 aunts    Breast cancer Daughter    Cancer Other    Diabetes Other    Colon cancer Neg Hx    Colon polyps Neg Hx    Esophageal cancer Neg Hx    Stomach cancer Neg Hx    Rectal cancer Neg Hx    Neuropathy Neg Hx      Social History   Socioeconomic History   Marital status: Single    Spouse name: Not on file   Number of children: 3   Years of education: 12   Highest education level: Not on file  Occupational History   Occupation: [employer]     Employer: [employer]  Tobacco Use   Smoking status: Former    Current packs/day: 0.00    Average packs/day: 0.5 packs/day for 30.0 years (15.0 ttl pk-yrs)    Types: Cigarettes    Start date: 09/11/1992    Quit date: 09/12/2022    Years since quitting: 0.6   Smokeless tobacco: Never  Vaping Use   Vaping status: Never Used  Substance and Sexual Activity  Alcohol use: No    Alcohol/week: 0.0 standard drinks of alcohol   Drug use: No   Sexual activity: Not Currently  Other Topics Concern   Not on file  Social History Narrative   Has 3 children. They live in [place].   Has 3 grandchildren.    [name] (oldest daughter)    Lives alone.    Social Determinants of Health   Financial Resource Strain: Low Risk  (10/03/2022)   Overall Financial Resource Strain (CARDIA)    Difficulty of Paying Living Expenses: Not hard at all  Food Insecurity: Low Risk  (05/08/2023)   Received from [facility]   Hunger Vital Sign    Worried About Running Out of Food in the Last Year: Never true    Ran Out of Food in the Last Year: Never true  Transportation Needs: No  Transportation Needs (05/08/2023)   Received from [facility]    In the past 12 months, has lack of reliable transportation kept you from medical appointments, meetings, work or from getting things needed for daily living? : No  Physical Activity: Inactive (10/03/2022)   Exercise Vital Sign    Days of Exercise per Week: 0 days    Minutes of Exercise per Session: 0 min  Stress: No Stress Concern Present (10/03/2022)   [employer] of Occupational Health - Occupational Stress Questionnaire    Feeling of Stress : Not at all  Social Connections: Not on file  Intimate Partner Violence: Not At Risk (09/16/2022)   Humiliation, Afraid, Rape, and Kick questionnaire    Fear of Current or Ex-Partner: No    Emotionally Abused: No    Physically Abused: No    Sexually Abused: No     Allergies  Allergen Reactions   Aspirin     Stomach cramps   Penicillins Hives    Did it involve swelling of the face/tongue/throat, SOB, or low BP? No Did it involve sudden or severe rash/hives, skin peeling, or any reaction on the inside of your mouth or nose? Yes Did you need to seek medical attention at a hospital or doctor's office? Yes When did it last happen?    Several Years Ago   If all above answers are "NO", may proceed with cephalosporin use.       Outpatient Medications Prior to Visit  Medication Sig Dispense Refill   albuterol (PROVENTIL) (2.5 MG/3ML) 0.083% nebulizer solution Take 3 mLs (2.5 mg total) by nebulization every 6 (six) hours as needed for wheezing or shortness of breath. 180 mL 1   albuterol (VENTOLIN HFA) 108 (90 Base) MCG/ACT inhaler Inhale 2 puffs into the lungs every 6 (six) hours as needed for wheezing or shortness of breath. 18 g 2   amLODipine (NORVASC) 10 MG tablet Take 1 tablet (10 mg total) by mouth daily. 90 tablet 1   aspirin EC 81 MG tablet Take by mouth.     atorvastatin (LIPITOR) 80 MG tablet Take 1 tablet (80 mg total) by mouth at bedtime. 90 tablet 3    cetirizine (ZYRTEC) 10 MG tablet Take 1 tablet (10 mg total) by mouth daily. 100 tablet 1   cholecalciferol 25 MCG (1000 UT) tablet Take 1,000 Units by mouth daily.     cilostazol (PLETAL) 100 MG tablet Take 1 tablet (100 mg total) by mouth 2 (two) times daily. 180 tablet 3   doxycycline (VIBRA-TABS) 100 MG tablet Take 1 tablet (100 mg total) by mouth 2 (two) times daily. 14  tablet 0   fluticasone (FLONASE) 50 MCG/ACT nasal spray Place 2 sprays into both nostrils daily. 16 g 6   Tiotropium Bromide-Olodaterol (STIOLTO RESPIMAT) 2.5-2.5 MCG/ACT AERS Inhale 2 puffs into the lungs daily. 4 g 6   alendronate (FOSAMAX) 70 MG tablet Take 1 tablet (70 mg total) by mouth every 7 (seven) days. Take with a full glass of water on an empty stomach. (Patient not taking: Reported on 04/24/2023) 12 tablet 1   naproxen (NAPROSYN) 500 MG tablet Take 1 tablet (500 mg total) by mouth 2 times daily with meals for 30 days then reduce to 1 tablet (500 mg total) by mouth nightly. (Patient not taking: Reported on 05/09/2023) 60 tablet 5   traZODone (DESYREL) 50 MG tablet Take 0.5-1 tablets (25-50 mg total) by mouth at bedtime as needed for sleep. (Patient not taking: Reported on 05/09/2023) 30 tablet 3   Facility-Administered Medications Prior to Visit  Medication Dose Route Frequency Provider Last Rate Last Admin   acetaminophen (TYLENOL) tablet 650 mg  650 mg Oral Q6H PRN Storm Frisk, MD   650 mg at 03/14/23 1610         Objective:   Physical Exam Vitals:   05/09/23 1454 05/09/23 1457  BP: 130/88   Pulse: (!) 125 (!) 109  Temp:  98.2 F (36.8 C)  TempSrc:  Oral  SpO2: 98%   Weight:  152 lb 12.8 oz (69.3 kg)  Height:  5\' 6"  (1.676 m)     Gen: Pleasant, well-nourished, in no distress,  normal affect  ENT: No lesions,  mouth clear,  oropharynx clear, no postnasal drip  Neck: No JVD, no stridor  Lungs: No use of accessory muscles, no crackles or wheezing on normal respiration, no wheeze on forced  expiration  Cardiovascular: RRR, heart sounds normal, no murmur or gallops, no peripheral edema  Musculoskeletal: No deformities, no cyanosis or clubbing  Neuro: alert, awake, non focal  Skin: Warm, no lesions or rash      Assessment & Plan:   COPD (chronic obstructive pulmonary disease) (HCC) Chronic Obstructive Pulmonary Disease (COPD) Severe obstruction noted on PFTs (FEV1 of 1.49 liters or 56% predicted). Patient reports shortness of breath, particularly in the morning, despite adherence to [employer]. No wheezing or coughing reported. -Continue Stiolto inhaler, two puffs once daily. -Permit judicious use of Albuterol inhaler for episodes of shortness of breath. -Perform walking test today to assess for potential hypoxia during exertion.  Former tobacco use Lung Cancer Screening History of tobacco use and COPD places patient at high risk. -Schedule next screening CT chest for May 2025.  Insomnia Insomnia Patient reports difficulty falling asleep and staying asleep, leading to feeling unrested in the morning. No current sleep aids being used. -Consider evaluation for potential sleep disorders if insomnia persists.  Tachycardia Tachycardia Patient reports elevated heart rate, particularly with exertion. Cardiology consultation already scheduled. -Continue with planned cardiology consultation in December 2024.     Levy Pupa, MD, PhD 05/09/2023, 3:16 PM [facility] [phone] or if no answer before 7:00PM call [phone] For any issues after 7:00PM please call eLink [phone]

## 2023-05-09 NOTE — Assessment & Plan Note (Signed)
Chronic Obstructive Pulmonary Disease (COPD) Severe obstruction noted on PFTs (FEV1 of 1.49 liters or 56% predicted). Patient reports shortness of breath, particularly in the morning, despite adherence to [employer]. No wheezing or coughing reported. -Continue Stiolto inhaler, two puffs once daily. -Permit judicious use of Albuterol inhaler for episodes of shortness of breath. -Perform walking test today to assess for potential hypoxia during exertion.

## 2023-05-16 ENCOUNTER — Other Ambulatory Visit: Payer: Self-pay

## 2023-05-16 ENCOUNTER — Ambulatory Visit
Admission: RE | Admit: 2023-05-16 | Discharge: 2023-05-16 | Disposition: A | Discharge status: Home / Self Care | Payer: Medicare HMO | Source: Ambulatory Visit | Attending: Physician Assistant | Admitting: Physician Assistant

## 2023-05-16 ENCOUNTER — Ambulatory Visit: Payer: Medicare HMO | Attending: Physician Assistant | Admitting: Physician Assistant

## 2023-05-16 ENCOUNTER — Encounter: Payer: Self-pay | Admitting: Physician Assistant

## 2023-05-16 VITALS — BP 133/85 | HR 108 | Ht 66.0 in | Wt 152.8 lb

## 2023-05-16 DIAGNOSIS — I739 Peripheral vascular disease, unspecified: Secondary | ICD-10-CM

## 2023-05-16 DIAGNOSIS — I10 Essential (primary) hypertension: Secondary | ICD-10-CM

## 2023-05-16 DIAGNOSIS — R198 Other specified symptoms and signs involving the digestive system and abdomen: Secondary | ICD-10-CM

## 2023-05-16 DIAGNOSIS — M545 Low back pain, unspecified: Secondary | ICD-10-CM

## 2023-05-16 DIAGNOSIS — R5383 Other fatigue: Secondary | ICD-10-CM | POA: Diagnosis not present

## 2023-05-16 MED ORDER — AMLODIPINE BESYLATE 10 MG PO TABS
10.0000 mg | ORAL_TABLET | Freq: Every day | ORAL | 1 refills | Status: DC
  Filled 2023-05-16: qty 90, 90d supply, fill #0

## 2023-05-16 NOTE — Progress Notes (Signed)
Patient ID: Martha Koch, female   DOB: 08-12-1955, 67 y.o.   MRN: [id number]   Martha Koch, is a 67 y.o. female  YQM:[id number]  BMW:[id number]  DOB - Apr 04, 1956  Chief Complaint  Patient presents with   Fatigue    Pain in waist       Subjective:   Martha Koch is a 67 y.o. female here today for multiple issues.  She wants to be seen by a vascular specialist for a second opinion related to claudication in legs.  She has been seen at [facility] and is not happy with their recommendations.  She has pain in her legs when she tries to walk more than 30 to 40 feet.    For the past 3 weeks she has been having an increase in fatigue along with lower abdominal fullness and lower back discomfort.  No pain radiating into legs.  No urinary or vaginal s/sx.  No melena or hematochezia.  No change in BM.  She is utd on colonoscopy from 2021 and on a 10 year schedule.  Abdomen and lower back "feel heavy."  She wants an all over MRI bc she feels like something is wrong.  No fever.  No CP.  Seen recently by pulmonology.  Appetite is fair.  No night sweats or cough.    No problems updated.  ALLERGIES: Allergies  Allergen Reactions   Aspirin     Stomach cramps   Penicillins Hives    Did it involve swelling of the face/tongue/throat, SOB, or low BP? No Did it involve sudden or severe rash/hives, skin peeling, or any reaction on the inside of your mouth or nose? Yes Did you need to seek medical attention at a hospital or doctor's office? Yes When did it last happen?    Several Years Ago   If all above answers are "NO", may proceed with cephalosporin use.      PAST MEDICAL HISTORY: Past Medical History:  Diagnosis Date   Cervical cancer (HCC) cervical   Hyperlipidemia    Hypertension    Radiation 06/02/14-07/13/14   pelvis 45 gray, vaginal cuff/parametrial boost to 50.4 gray   Radiation 04/06/15-05/13/15   anterior abdominal wall 50.4 gray    MEDICATIONS AT HOME: Prior to  Admission medications   Medication Sig Start Date End Date Taking? Authorizing Provider  albuterol (PROVENTIL) (2.5 MG/3ML) 0.083% nebulizer solution Take 3 mLs (2.5 mg total) by nebulization every 6 (six) hours as needed for wheezing or shortness of breath. 10/10/22  Yes Teylor Wolven, Marzella Schlein, PA-C  albuterol (VENTOLIN HFA) 108 (90 Base) MCG/ACT inhaler Inhale 2 puffs into the lungs every 6 (six) hours as needed for wheezing or shortness of breath. 10/04/22  Yes Anders Simmonds, PA-C  aspirin EC 81 MG tablet Take by mouth.   Yes [provider]  atorvastatin (LIPITOR) 80 MG tablet Take 1 tablet (80 mg total) by mouth at bedtime. 03/11/23  Yes   cetirizine (ZYRTEC) 10 MG tablet Take 1 tablet (10 mg total) by mouth daily. 08/02/22  Yes Hoy Register, MD  cholecalciferol 25 MCG (1000 UT) tablet Take 1,000 Units by mouth daily. 05/29/22  Yes [provider]  cilostazol (PLETAL) 100 MG tablet Take 1 tablet (100 mg total) by mouth 2 (two) times daily. 03/11/23  Yes   fluticasone (FLONASE) 50 MCG/ACT nasal spray Place 2 sprays into both nostrils daily. 02/04/23  Yes Hoy Register, MD  Tiotropium Bromide-Olodaterol (STIOLTO RESPIMAT) 2.5-2.5 MCG/ACT AERS Inhale 2 puffs into the lungs  daily. 02/21/23  Yes Cobb, Ruby Cola, NP  amLODipine (NORVASC) 10 MG tablet Take 1 tablet (10 mg total) by mouth daily. 05/16/23   Lakesa Coste, Marzella Schlein, PA-C    ROS: Neg HEENT Neg resp Neg cardiac Neg GI Neg GU Neg MS Neg psych Neg neuro  Objective:   Vitals:   05/16/23 1339  BP: 133/85  Pulse: (!) 108  SpO2: 98%  Weight: 152 lb 12.8 oz (69.3 kg)  Height: 5\' 6"  (1.676 m)   Exam General appearance : Awake, alert, not in any distress. Speech Clear. Not toxic looking HEENT: Atraumatic and Normocephalic Neck: Supple, no JVD. No cervical lymphadenopathy.  Chest: Good air entry bilaterally, CTAB.  No rales/rhonchi/wheezing CVS: S1 S2 regular, no murmurs. Rate on exam 92 Abdomen: Bowel sounds present,  Non tender and not distended with no gaurding, rigidity or rebound. Extremities: B/L Lower Ext shows no edema, both legs are warm to touch Back:  no spiny TTP.  Neg SLR B.  BLE=intact B.   Neurology: Awake alert, and oriented X 3, CN II-XII intact, Non focal Skin: No Rash  Data Review Lab Results  Component Value Date   HGBA1C 6.3 (H) 02/08/2023   HGBA1C 6.1 (H) 08/02/2022   HGBA1C 5.7 (A) 04/18/2021    Assessment & Plan   1. Abdominal fullness UTD colonoscopy.   - B12 and Folate Panel - DG Abd 1 View; Future - CBC with Differential  2. Claudication [facility]) She wants second opinion - Ambulatory referral to Vascular Surgery  3. Other fatigue - TSH - Vitamin D, 25-hydroxy - B12 and Folate Panel - Comprehensive metabolic panel  4. Essential hypertension controlled - amLODipine (NORVASC) 10 MG tablet; Take 1 tablet (10 mg total) by mouth daily.  Dispense: 90 tablet; Refill: 1 - Comprehensive metabolic panel - CBC with Differential  5. Acute midline low back pain without sciatica - Vitamin D, 25-hydroxy - DG Lumbar Spine Complete; Future Initiate workup and follow up in 6 weeks with PCP given complicated medical history   Return in about 6 weeks (around 06/27/2023) for Dr Alvis Lemmings for back and abdominal fullness with fatigue.  The patient was given clear instructions to go to ER or return to medical center if symptoms don't improve, worsen or new problems develop. The patient verbalized understanding. The patient was told to call to get lab results if they haven't heard anything in the next week.      Georgian Co, PA-C [facility] [place], [place] [phone]   05/16/2023, 2:01 PM

## 2023-05-17 ENCOUNTER — Other Ambulatory Visit: Payer: Self-pay

## 2023-05-17 LAB — COMPREHENSIVE METABOLIC PANEL
ALT: 14 [IU]/L (ref 0–32)
AST: 24 [IU]/L (ref 0–40)
Albumin: 4.6 g/dL (ref 3.9–4.9)
Alkaline Phosphatase: 114 [IU]/L (ref 44–121)
BUN/Creatinine Ratio: 7 — ABNORMAL LOW (ref 12–28)
BUN: 6 mg/dL — ABNORMAL LOW (ref 8–27)
Bilirubin Total: 0.2 mg/dL (ref 0.0–1.2)
CO2: 16 mmol/L — ABNORMAL LOW (ref 20–29)
Calcium: 10.1 mg/dL (ref 8.7–10.3)
Chloride: 106 mmol/L (ref 96–106)
Creatinine, Ser: 0.92 mg/dL (ref 0.57–1.00)
Globulin, Total: 2.9 g/dL (ref 1.5–4.5)
Glucose: 85 mg/dL (ref 70–99)
Potassium: 4 mmol/L (ref 3.5–5.2)
Sodium: 146 mmol/L — ABNORMAL HIGH (ref 134–144)
Total Protein: 7.5 g/dL (ref 6.0–8.5)
eGFR: 68 mL/min/{1.73_m2} (ref >59)

## 2023-05-17 LAB — CBC WITH DIFFERENTIAL/PLATELET
Basophils Absolute: 0 10*3/uL (ref 0.0–0.2)
Basos: 1 %
EOS (ABSOLUTE): 0.1 10*3/uL (ref 0.0–0.4)
Eos: 1 %
Hematocrit: 42 % (ref 34.0–46.6)
Hemoglobin: 13.4 g/dL (ref 11.1–15.9)
Immature Grans (Abs): 0 10*3/uL (ref 0.0–0.1)
Immature Granulocytes: 0 %
Lymphocytes Absolute: 2 10*3/uL (ref 0.7–3.1)
Lymphs: 41 %
MCH: 25 pg — ABNORMAL LOW (ref 26.6–33.0)
MCHC: 31.9 g/dL (ref 31.5–35.7)
MCV: 78 fL — ABNORMAL LOW (ref 79–97)
Monocytes Absolute: 0.5 10*3/uL (ref 0.1–0.9)
Monocytes: 11 %
Neutrophils Absolute: 2.2 10*3/uL (ref 1.4–7.0)
Neutrophils: 46 %
Platelets: 423 10*3/uL (ref 150–450)
RBC: 5.36 x10E6/uL — ABNORMAL HIGH (ref 3.77–5.28)
RDW: 15.3 % (ref 11.7–15.4)
WBC: 4.8 10*3/uL (ref 3.4–10.8)

## 2023-05-17 LAB — VITAMIN D 25 HYDROXY (VIT D DEFICIENCY, FRACTURES): Vit D, 25-Hydroxy: 34.8 ng/mL (ref 30.0–100.0)

## 2023-05-17 LAB — B12 AND FOLATE PANEL
Folate: >20 ng/mL (ref >3.0)
Vitamin B-12: 657 pg/mL (ref 232–1245)

## 2023-05-17 LAB — TSH: TSH: 14.7 u[IU]/mL — ABNORMAL HIGH (ref 0.450–4.500)

## 2023-05-20 ENCOUNTER — Other Ambulatory Visit: Payer: Self-pay

## 2023-05-20 ENCOUNTER — Other Ambulatory Visit: Payer: Self-pay | Admitting: Physician Assistant

## 2023-05-20 DIAGNOSIS — R7989 Other specified abnormal findings of blood chemistry: Secondary | ICD-10-CM

## 2023-05-20 DIAGNOSIS — I70213 Atherosclerosis of native arteries of extremities with intermittent claudication, bilateral legs: Secondary | ICD-10-CM

## 2023-05-20 MED ORDER — LEVOTHYROXINE SODIUM 50 MCG PO TABS
50.0000 ug | ORAL_TABLET | Freq: Every day | ORAL | 1 refills | Status: DC
  Filled 2023-05-20: qty 90, 90d supply, fill #0

## 2023-05-21 ENCOUNTER — Other Ambulatory Visit: Payer: Self-pay

## 2023-05-21 ENCOUNTER — Telehealth: Payer: Self-pay

## 2023-05-21 ENCOUNTER — Ambulatory Visit (HOSPITAL_COMMUNITY)
Admission: RE | Admit: 2023-05-21 | Discharge: 2023-05-21 | Disposition: A | Discharge status: Home / Self Care | Payer: Medicare HMO | Source: Ambulatory Visit | Attending: Vascular Surgery | Admitting: Vascular Surgery

## 2023-05-21 DIAGNOSIS — I70213 Atherosclerosis of native arteries of extremities with intermittent claudication, bilateral legs: Secondary | ICD-10-CM | POA: Insufficient documentation

## 2023-05-21 LAB — VAS US ABI WITH/WO TBI
Left ABI: 0.72
Right ABI: 0.51

## 2023-05-21 NOTE — Telephone Encounter (Signed)
Pt was called and is aware of results, DOB was confirmed.  ?

## 2023-05-21 NOTE — Telephone Encounter (Signed)
-----   Message from Georgian Co sent at 05/20/2023  7:42 AM EST ----- Hi Ms Williston.  Your thyroid is underactive and is likely the cause of the extreme fatigue you have been having.  I sen you a prescription of thyroid medication to start taking.  I have placed orders for you to come and get labs in 2 months to recheck the levels(please schedule "Lab Only appointment" for this.  You are not anemic.  Your sodium is a little elevated so increase your water intake.  Your vitamin D, B12, and folate are normal.  Thanks, Georgian Co, PA-C

## 2023-05-23 ENCOUNTER — Ambulatory Visit: Payer: Self-pay | Admitting: *Deleted

## 2023-05-23 NOTE — Telephone Encounter (Signed)
Attempted to return her call.   Left a voicemail to call back. 

## 2023-05-23 NOTE — Telephone Encounter (Signed)
  Chief Complaint: medication problem  Symptoms: heartburn Frequency: couple of days  Pertinent Negatives: NA Disposition: [] ED /[] Urgent Care (no appt availability in office) / [] Appointment(In office/virtual)/ []  [place] [facility]/ [] Home Care/ [] Refused Recommended Disposition /[] [place] [facility]/ [x]  Follow-up with PCP Additional Notes: pt states she is having extreme heartburn from taking levothyroxine. Takes 1st thing in morning with 20 oz of water and then waits about 2 hours before eating. Pt states she had to take an antiacid the other night to help with heartburn being so bad. Pt is wanting to see if there is another medication or what she can do to prevent these side effects advised I would send message to provider for review and have nurse FU with her. Pt verbalized understanding.   Summary: levothyroxine (SYNTHROID) 50 MCG tablet? heartburn   The patient called in stating she cannot tolerate the thyroid medication, levothyroxine (SYNTHROID) 50 MCG tablet. She states she has been having heartburn from it. She is drinking 16 ounces of water with it and on an empty stomach in the morning. Please assist patient further.         Reason for Disposition  [1] Caller has URGENT medicine question about med that PCP or specialist prescribed AND [2] triager unable to answer question  Answer Assessment - Initial Assessment Questions 1. NAME of MEDICINE: "What medicine(s) are you calling about?"     Levothyroxine  2. QUESTION: "What is your question?" (e.g., double dose of medicine, side effect)     Having bad side effects from medication 3. PRESCRIBER: "Who prescribed the medicine?" Reason: if prescribed by specialist, call should be referred to that group.     Dr. Alvis Lemmings  4. SYMPTOMS: "Do you have any symptoms?" If Yes, ask: "What symptoms are you having?"  "How bad are the symptoms (e.g., mild, moderate, severe)     Heartburn  Protocols used: Medication Question Call-A-AH

## 2023-05-23 NOTE — Telephone Encounter (Signed)
Message from [place] S sent at 05/23/2023 12:18 PM EST  Summary: levothyroxine (SYNTHROID) 50 MCG tablet? heartburn   The patient called in stating she cannot tolerate the thyroid medication, levothyroxine (SYNTHROID) 50 MCG tablet. She states she has been having heartburn from it. She is drinking 16 ounces of water with it and on an empty stomach in the morning. Please assist patient further.          Call History  Contact Date/Time Type Contact Phone/Fax User  05/23/2023 12:16 PM EST Phone ([address]) [place], [place] (Self) [phone] Judie Petit) Mariel Kansky P

## 2023-05-24 NOTE — Telephone Encounter (Signed)
She can hold off on the levothyroxine and repeat her labs in 2 months after which a decision will be made as she just had a single abnormal reading.  If it is still abnormal, she may need to be started on a lower dose of levothyroxine.

## 2023-05-24 NOTE — Telephone Encounter (Signed)
She would like to have an ENT specialist for a 2nd opinion.   She has concerns about her swallowing.  She states she will discontinue the medication but would like to find out the underlying cause.  She has been dealing with this all year.

## 2023-05-27 NOTE — Telephone Encounter (Signed)
ENT do not manage hypothyroidism. If she has difficulty swallowing then she needs a visit with me so we can discuss work up and referral to the appropriate specialist if indicated.

## 2023-05-27 NOTE — Progress Notes (Unsigned)
[facility] [place]  ASSESSMENT / PLAN: Martha Koch is a 67 y.o. 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  Patient counseled patients with asymptomatic peripheral arterial disease or claudication have a 1-2% risk of developing chronic limb threatening ischemia, but a 15-30% risk of mortality in the next 5 years. Intervention should only be considered for medically optimized patients with disabling symptoms.   Recommend:  Abstinence from all tobacco products. Blood glucose control with goal A1c < 7%. Blood pressure control with goal blood pressure < 140/90 mmHg. Lipid reduction therapy with goal LDL-C <100 mg/dL  Aspirin 81mg  PO QD.  Atorvastatin 40-80mg  PO QD (or other "high intensity" statin therapy).  Patient encouraged to start a walking regimen.  She can discontinue Pletal.  I will see her back in 1 to 2 months to evaluate her progress.  CHIEF COMPLAINT: Leg pain with walking  HISTORY OF PRESENT ILLNESS: Martha Koch is a 67 y.o. female who presents to clinic for evaluation of lower extremity arterial disease.  The patient was evaluated by Dr.Stutsrim for the same.  The patient desired a second opinion.  The patient described fairly atypical symptoms to Dr. Alben Spittle.  To me, she reports cramping discomfort in her calf and thighs which occurs after walking about a block.  This is fairly reliably relieved by rest.  The patient also endorses some positional discomfort which may be due to spinal stenosis.  She reports a fairly extensive workup to date including muscle biopsies, nerve ultrasounds, epidural spinal injections, etc.  None of this was helpful to her symptoms.  Past Medical History:  Diagnosis Date   Cervical cancer (HCC) cervical   Hyperlipidemia    Hypertension    Radiation 06/02/14-07/13/14   pelvis 45 gray, vaginal cuff/parametrial boost to 50.4 gray   Radiation  04/06/15-05/13/15   anterior abdominal wall 50.4 gray    Past Surgical History:  Procedure Laterality Date   ABDOMINAL HYSTERECTOMY     COLONOSCOPY  2011   MUSCLE BIOPSY Right 09/14/2021   Procedure: MUSCLE BIOPSY RIGHT THIGH AND RIGHT LOWER LEG;  Surgeon: Almond Lint, MD;  Location: [facility] OR;  Service: General;  Laterality: Right;   robotic radical hysterectomy, bso, pelvic lymphadenectomy Bilateral 04/21/14   TUBAL LIGATION      Family History  Problem Relation Age of Onset   Hypertension Father    Breast cancer Maternal Aunt        breast cancer, x 4 aunts    Breast cancer Daughter    Cancer Other    Diabetes Other    Colon cancer Neg Hx    Colon polyps Neg Hx    Esophageal cancer Neg Hx    Stomach cancer Neg Hx    Rectal cancer Neg Hx    Neuropathy Neg Hx     Social History   Socioeconomic History   Marital status: Single    Spouse name: Not on file   Number of children: 3   Years of education: 12   Highest education level: Not on file  Occupational History   Occupation: [employer]     Employer: [employer]  Tobacco Use   Smoking status: Former    Current packs/day: 0.00    Average packs/day: 0.5 packs/day for 30.0 years (15.0 ttl pk-yrs)    Types: Cigarettes    Start date: 09/11/1992    Quit date: 09/12/2022    Years since quitting: 0.7   Smokeless  tobacco: Never  Vaping Use   Vaping status: Never Used  Substance and Sexual Activity   Alcohol use: No    Alcohol/week: 0.0 standard drinks of alcohol   Drug use: No   Sexual activity: Not Currently  Other Topics Concern   Not on file  Social History Narrative   Has 3 children. They live in [place].   Has 3 grandchildren.    [name] (oldest daughter)    Lives alone.    Social Determinants of Health   Financial Resource Strain: Low Risk  (10/03/2022)   Overall Financial Resource Strain (CARDIA)    Difficulty of Paying Living Expenses: Not hard at all  Food Insecurity: Low Risk   (05/08/2023)   Received from [facility]   Hunger Vital Sign    Worried About Running Out of Food in the Last Year: Never true    Ran Out of Food in the Last Year: Never true  Transportation Needs: No Transportation Needs (05/08/2023)   Received from [facility]    In the past 12 months, has lack of reliable transportation kept you from medical appointments, meetings, work or from getting things needed for daily living? : No  Physical Activity: Inactive (10/03/2022)   Exercise Vital Sign    Days of Exercise per Week: 0 days    Minutes of Exercise per Session: 0 min  Stress: No Stress Concern Present (10/03/2022)   [employer] of Occupational Health - Occupational Stress Questionnaire    Feeling of Stress : Not at all  Social Connections: Not on file  Intimate Partner Violence: Not At Risk (09/16/2022)   Humiliation, Afraid, Rape, and Kick questionnaire    Fear of Current or Ex-Partner: No    Emotionally Abused: No    Physically Abused: No    Sexually Abused: No    Allergies  Allergen Reactions   Aspirin     Stomach cramps   Penicillins Hives    Did it involve swelling of the face/tongue/throat, SOB, or low BP? No Did it involve sudden or severe rash/hives, skin peeling, or any reaction on the inside of your mouth or nose? Yes Did you need to seek medical attention at a hospital or doctor's office? Yes When did it last happen?    Several Years Ago   If all above answers are "NO", may proceed with cephalosporin use.      Current Outpatient Medications  Medication Sig Dispense Refill   albuterol (PROVENTIL) (2.5 MG/3ML) 0.083% nebulizer solution Take 3 mLs (2.5 mg total) by nebulization every 6 (six) hours as needed for wheezing or shortness of breath. 180 mL 1   albuterol (VENTOLIN HFA) 108 (90 Base) MCG/ACT inhaler Inhale 2 puffs into the lungs every 6 (six) hours as needed for wheezing or shortness of breath. 18 g 2   amLODipine (NORVASC) 10 MG tablet  Take 1 tablet (10 mg total) by mouth daily. 90 tablet 1   aspirin EC 81 MG tablet Take by mouth.     atorvastatin (LIPITOR) 80 MG tablet Take 1 tablet (80 mg total) by mouth at bedtime. 90 tablet 3   cetirizine (ZYRTEC) 10 MG tablet Take 1 tablet (10 mg total) by mouth daily. 100 tablet 1   cholecalciferol 25 MCG (1000 UT) tablet Take 1,000 Units by mouth daily.     cilostazol (PLETAL) 100 MG tablet Take 1 tablet (100 mg total) by mouth 2 (two) times daily. 180 tablet 3   fluticasone (FLONASE) 50 MCG/ACT  nasal spray Place 2 sprays into both nostrils daily. 16 g 6   levothyroxine (SYNTHROID) 50 MCG tablet Take 1 tablet (50 mcg total) by mouth daily. 90 tablet 1   Tiotropium Bromide-Olodaterol (STIOLTO RESPIMAT) 2.5-2.5 MCG/ACT AERS Inhale 2 puffs into the lungs daily. 4 g 6   Current Facility-Administered Medications  Medication Dose Route Frequency Provider Last Rate Last Admin   acetaminophen (TYLENOL) tablet 650 mg  650 mg Oral Q6H PRN Storm Frisk, MD   650 mg at 03/14/23 0922    PHYSICAL EXAM Vitals:   05/28/23 1437  BP: 133/79  Pulse: 90  Resp: 20  Temp: 98 F (36.7 C)  SpO2: 100%  Weight: 151 lb (68.5 kg)  Height: 5\' 6"  (1.676 m)    Well-appearing woman in no distress Regular rate and rhythm Unlabored breathing No palpable femoral pulses No palpable pedal pulses   PERTINENT LABORATORY AND RADIOLOGIC DATA  Most recent CBC    Latest Ref Rng & Units 05/16/2023    2:08 PM 09/17/2022    4:06 AM 09/16/2022    3:07 PM  CBC  WBC 3.4 - 10.8 x10E3/uL 4.8  3.3  4.7   Hemoglobin 11.1 - 15.9 g/dL 40.9  81.1  91.4   Hematocrit 34.0 - 46.6 % 42.0  41.3  43.4   Platelets 150 - 450 x10E3/uL 423  201  185      Most recent CMP    Latest Ref Rng & Units 05/16/2023    2:08 PM 02/08/2023   11:05 AM 09/17/2022    4:06 AM  CMP  Glucose 70 - 99 mg/dL 85  782  956   BUN 8 - 27 mg/dL 6  9  12    Creatinine 0.57 - 1.00 mg/dL 2.13  0.86  5.78   Sodium 134 - 144 mmol/L 146  144   139   Potassium 3.5 - 5.2 mmol/L 4.0  4.3  3.9   Chloride 96 - 106 mmol/L 106  107  108   CO2 20 - 29 mmol/L 16  22  22    Calcium 8.7 - 10.3 mg/dL 46.9  9.5  8.6   Total Protein 6.0 - 8.5 g/dL 7.5  6.7    Total Bilirubin 0.0 - 1.2 mg/dL 0.2  0.3    Alkaline Phos 44 - 121 IU/L 114  93    AST 0 - 40 IU/L 24  37    ALT 0 - 32 IU/L 14  42      Renal function Estimated Creatinine Clearance: 54.8 mL/min (by C-G formula based on SCr of 0.92 mg/dL).  Hgb A1c MFr Bld (%)  Date Value  02/08/2023 6.3 (H)    LDL Chol Calc (NIH)  Date Value Ref Range Status  02/08/2023 68 0 - 99 mg/dL Final    CT angiogram 12/29/5282 VASCULAR   1. 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 Otherwise patent appearing right lower extremity inflow,  outflow, and 3 vessel runoff.  2. There are 2 focal moderate to severe stenoses in the left  external iliac artery secondary to fibrofatty and calcific  atherosclerotic plaques. The left lower extremity inflow and outflow  vessels are otherwise patent. Limited evaluation of the runoff  vessels due to contrast bolus timing, however no evidence of  occlusion or significant atherosclerotic burden.  3.  Aortic Atherosclerosis (ICD10-I70.0).    +-------+-----------+-----------+------------+------------+  ABI/TBIToday's ABIToday's TBIPrevious ABIPrevious TBI  +-------+-----------+-----------+------------+------------+  Right 0.51  0.39                                 +-------+-----------+-----------+------------+------------+  Left  0.72       0.51                                 +-------+-----------+-----------+------------+------------+   Rande Brunt. Lenell Antu, MD [facility] Phone Number: [phone] 05/27/2023 12:24 PM   Total time spent on preparing this encounter including chart review, data review, collecting history, examining the patient, coordinating care for  this new patient, 60 minutes.  Portions of this report may have been transcribed using voice recognition software.  Every effort has been made to ensure accuracy; however, inadvertent computerized transcription errors may still be present.

## 2023-05-28 ENCOUNTER — Ambulatory Visit: Payer: Medicare HMO | Admitting: Vascular Surgery

## 2023-05-28 ENCOUNTER — Encounter: Payer: Self-pay | Admitting: Vascular Surgery

## 2023-05-28 VITALS — BP 133/79 | HR 90 | Temp 98.0°F | Resp 20 | Ht 66.0 in | Wt 151.0 lb

## 2023-05-28 DIAGNOSIS — I70213 Atherosclerosis of native arteries of extremities with intermittent claudication, bilateral legs: Secondary | ICD-10-CM | POA: Diagnosis not present

## 2023-05-28 NOTE — Telephone Encounter (Signed)
Scheduled an appt with patient to discuss throat concerns with Dr. Alvis Lemmings. 1216/2024 at [phone]

## 2023-06-04 ENCOUNTER — Other Ambulatory Visit: Payer: Self-pay | Admitting: Vascular Surgery

## 2023-06-04 ENCOUNTER — Telehealth: Payer: Self-pay

## 2023-06-04 DIAGNOSIS — R0609 Other forms of dyspnea: Secondary | ICD-10-CM | POA: Diagnosis not present

## 2023-06-04 NOTE — Telephone Encounter (Signed)
-----   Message from Georgian Co sent at 06/02/2023  6:12 PM EST ----- Please call patient. Her back X-rays show degenerative changes(arthritis) and mild spurring. I will have her see ortho to see what options for pain control are. Thanks, Georgian Co, PA-C

## 2023-06-04 NOTE — Telephone Encounter (Signed)
Pt was called and is aware of results, DOB was confirmed.  ?

## 2023-06-05 ENCOUNTER — Other Ambulatory Visit: Payer: Self-pay | Admitting: Physician Assistant

## 2023-06-05 ENCOUNTER — Telehealth (HOSPITAL_COMMUNITY): Payer: Self-pay

## 2023-06-05 DIAGNOSIS — R937 Abnormal findings on diagnostic imaging of other parts of musculoskeletal system: Secondary | ICD-10-CM

## 2023-06-05 NOTE — Telephone Encounter (Signed)
Called pt to to discuss SET program, pt denies any questions about program after discussion. Pt relayed that she is interested in joining program. Will enroll her in the program.   Faustino Congress MS, ACSM-CEP  06/05/2023 2:30 PM

## 2023-06-05 NOTE — Telephone Encounter (Signed)
Noted thank you

## 2023-06-06 ENCOUNTER — Telehealth: Payer: Self-pay

## 2023-06-06 NOTE — Telephone Encounter (Signed)
Pt was called and vm was left, Information has been sent to nurse pool.

## 2023-06-06 NOTE — Telephone Encounter (Signed)
-----   Message from Georgian Co sent at 06/05/2023 11:55 AM EST ----- Please call patient.  Your xray showed mild constipation.  Increase your water  and fiber intake.  Thanks, Georgian Co, PA-C

## 2023-06-07 ENCOUNTER — Telehealth: Payer: Self-pay

## 2023-06-07 NOTE — Telephone Encounter (Signed)
Pt was called and is aware of results, DOB was confirmed.  ?

## 2023-06-07 NOTE — Telephone Encounter (Signed)
-----   Message from Georgian Co sent at 06/05/2023 11:55 AM EST ----- Please call patient.  Your xray showed mild constipation.  Increase your water  and fiber intake.  Thanks, Georgian Co, PA-C

## 2023-06-11 ENCOUNTER — Ambulatory Visit: Payer: Medicare HMO | Admitting: Physical Medicine and Rehabilitation

## 2023-06-11 ENCOUNTER — Encounter: Payer: Self-pay | Admitting: Physical Medicine and Rehabilitation

## 2023-06-11 DIAGNOSIS — M5442 Lumbago with sciatica, left side: Secondary | ICD-10-CM

## 2023-06-11 DIAGNOSIS — M5116 Intervertebral disc disorders with radiculopathy, lumbar region: Secondary | ICD-10-CM | POA: Diagnosis not present

## 2023-06-11 DIAGNOSIS — M5441 Lumbago with sciatica, right side: Secondary | ICD-10-CM

## 2023-06-11 DIAGNOSIS — G8929 Other chronic pain: Secondary | ICD-10-CM

## 2023-06-11 DIAGNOSIS — M5416 Radiculopathy, lumbar region: Secondary | ICD-10-CM

## 2023-06-11 NOTE — Progress Notes (Signed)
Patient states she has pain only when she is walking. She takes otc meds as needed for pain. She states she hasn't had any back injury.

## 2023-06-11 NOTE — Progress Notes (Signed)
Martha Koch - 67 y.o. female MRN [id number]  Date of birth: 1955/12/07  Office Visit Note: Visit Date: 06/11/2023 PCP: Hoy Register, MD Referred by: Anders Simmonds, PA-C  Subjective: Chief Complaint  Patient presents with   Lower Back - Pain   HPI: Martha Koch is a 67 y.o. female who comes in today per the request of [place] for evaluation of chronic, worsening and severe bilateral lower back pain radiating to buttocks, hips and down to legs, right greater than left. She reports severe pain and weakness to bilateral legs. Pain ongoing for several years, worsens with prolonged standing and walking. Patient feels her symptoms have progressed over the last 4 years. She describes her symptoms as weakness and feeling of her legs "giving way." Some relief of pain with home exercise regimen, rest and use of medications. Recent lumbar radiographs exhibit minor degenerative changes. Lumbar MRI imaging from January with [facility] exhibits left paracentral disc herniation at L5-S1 resulting in effacement of left S1 nerve root and mild bilateral foraminal stenosis. No high grade spinal canal stenosis noted. Patient states her symptoms and treatment course have become very frustrating as she has been told in the past her issues were potentially vascular vs lumbar spine. She underwent right L4-L5 interlaminar epidural steroid injection at [facility] in 2022, no relief of pain with this injection. She was recently evaluated by Dr. Heath Lark with Vascular Surgery, CT angiogram from in May of 2024 does show stenoses of bilateral external iliac arteries, per his note patient came to him requesting second opinion, per his notes her symptoms to bilateral lower extremities occur with standing/walking and are considerably relieved with rest. He recommended cardiac rehab/walking regimen. She was treated by Dr. Mancel Parsons with [facility] in March of 2024, she underwent right  sacroiliac joint injection and right greater trochanter injection with minimal relief of pain. Patient denies focal weakness, numbness and tingling. No recent trauma or falls.      Review of Systems  Musculoskeletal:  Positive for back pain.  Neurological:  Negative for tingling, sensory change, focal weakness and weakness.  All other systems reviewed and are negative.  Otherwise per HPI.  Assessment & Plan: Visit Diagnoses:    ICD-10-CM   1. Chronic bilateral low back pain with bilateral sciatica  M54.42 Ambulatory referral to Physical Medicine Rehab   M54.41    G89.29     2. Lumbar radiculopathy  M54.16 Ambulatory referral to Physical Medicine Rehab    3. Intervertebral disc disorders with radiculopathy, lumbar region  M51.16        Plan: Findings:  Chronic, worsening and severe bilateral lower back pain radiating to buttock, hips and down both legs, right greater than left. Patient continues to have severe pain despite good conservative therapies such as home exercise regimen, rest and use of medications. Patients clinical presentation and exam are complex, her pain does not directly correlate with specific dermatome. Left paracentral disc herniation at L5-S1 could be source of pain. We discussed treatment plan in detail today, next step is to perform diagnostic and hopefully therapeutic right L5-S1 interlaminar epidural steroid injection under fluoroscopic guidance. She is not currently taking anticoagulants. If good relief of pain with injection we can repeat this procedure infrequently as needed. Should her pain persist would recommend formal physical therapy and follow up with vascular to discuss further treatments. Could also look at referral back to Dr. Ricci Barker with [facility] as this is a more  comprehensive pain management practice. No red flag symptoms noted upon exam today.   Screening for Osteoporosis for Women Aged 23-80 Years of Age:    Patient has had a central  dual-energy X-ray absorptiometry (DXA) to check for osteoporosis.   Today's note sent to PCP on record. Patient does not need referral to [facility] osteoporosis clinic.      Meds & Orders: No orders of the defined types were placed in this encounter.   Orders Placed This Encounter  Procedures   Ambulatory referral to Physical Medicine Rehab    Follow-up: Return for Right L5-S1 interlaminar epidural steroid injection.   Procedures: No procedures performed      Clinical History: Jordan Likes, MD - 08/29/2022  Formatting of this note might be different from the original.  MRI LUMBAR SPINE WITH AND WITHOUT CONTRAST, 07/12/2022 11:54 AM   INDICATION: Ataxia, nontraumatic, lumbar pathology suspected \ M62.569 Atrophy of muscle of lower leg, unspecified laterality   COMPARISON: None   TECHNIQUE: Multiplanar, multisequence surface-coil magnetic resonance imaging of the lumbar spine was performed before and after intravenous administration of gadolinium-based contrast media.    LEVELS IMAGED: Lower thoracic to the upper sacral region.   FINDINGS:    Alignment:  No substantial subluxation.   Vertebrae:  Vertebral body heights are maintained. Diffuse heterogeneous marrow signal is nonspecific, but can be seen in individuals with chronic anemia, chronic hypoxemia (such as smoking) versus other marrow infiltrative/myeloproliferative disorders.   Conus medullaris:  In normal position. Normal signal and contour.   Degenerative changes:   T12-L1:  No substantial canal or foraminal stenosis.  L1-L2: Facet joint hypertrophy. No substantial canal or foraminal stenosis.  L2-L3: Facet joint and ligamentum flavum hypertrophy. No substantial canal or foraminal stenosis.  L3-L4:  Facet joint and ligamentum flavum hypertrophy. Small left facet joint effusion. No substantial canal or foraminal stenosis.  L4-L5: Disc desiccation. Facet joint and ligamentum flavum hypertrophy. Broad-based  disc bulge. No substantial canal or foraminal stenosis.  L5-S1: Disc desiccation. Facet joint and ligamentum flavum hypertrophy. Broad-based disc bulge with superimposed left paracentral small disc herniation. Effacement of the left subarticular recess. Mild bilateral foraminal stenosis.  No substantial canal stenosis.   Upper sacrum:  No focal lesion identified.   Contrast:  No abnormal contrast enhancement identified.   Additional comments:  None.    IMPRESSION:   1. Broad-based disc bulge with superimposed left paracentral disc herniation at L5-S1 results in effacement of the descending left S1 nerve root and mild bilateral foraminal stenosis.  2.  No substantial canal stenosis in the lumbar spine.   She reports that she quit smoking about 8 months ago. Her smoking use included cigarettes. She started smoking about 30 years ago. She has a 15 pack-year smoking history. She has never used smokeless tobacco.  Recent Labs    08/02/22 1016 02/08/23 1105  HGBA1C 6.1* 6.3*    Objective:  VS:  HT:    WT:   BMI:     BP:   HR: bpm  TEMP: ( )  RESP:  Physical Exam Vitals and nursing note reviewed.  HENT:     Head: Normocephalic and atraumatic.     Right Ear: External ear normal.     Left Ear: External ear normal.     Nose: Nose normal.     Mouth/Throat:     Mouth: Mucous membranes are moist.  Eyes:     Extraocular Movements: Extraocular movements intact.  Cardiovascular:     Rate  and Rhythm: Normal rate.     Pulses: Normal pulses.  Pulmonary:     Effort: Pulmonary effort is normal.  Abdominal:     General: Abdomen is flat. There is no distension.  Musculoskeletal:        General: Tenderness present.     Cervical back: Normal range of motion.     Comments: Patient rises from seated position to standing without difficulty. Good lumbar range of motion. No pain noted with facet loading. 5/5 strength noted with bilateral hip flexion, knee flexion/extension, ankle  dorsiflexion/plantarflexion and EHL. No clonus noted bilaterally. No pain upon palpation of greater trochanters. No pain with internal/external rotation of bilateral hips. Sensation intact bilaterally. Negative slump test bilaterally. Ambulates without aid, gait steady.     Skin:    General: Skin is warm and dry.     Capillary Refill: Capillary refill takes less than 2 seconds.  Neurological:     General: No focal deficit present.     Mental Status: She is alert and oriented to person, place, and time.  Psychiatric:        Mood and Affect: Mood normal.        Behavior: Behavior normal.     Ortho Exam  Imaging: No results found.  Past Medical/Family/Surgical/Social History: Medications & Allergies reviewed per EMR, new medications updated. Patient Active Problem List   Diagnosis Date Noted   Insomnia 05/09/2023   Tachycardia 05/09/2023   Cutaneous abscess of face 03/14/2023   Dermoid cyst 03/14/2023   Osteoporosis 01/10/2023   Former tobacco use 09/16/2022   Atrophy of muscle of right lower leg 08/23/2021   Dyslipidemia 04/18/2021   Prediabetes 04/18/2021   Body mass index (BMI) 19.9 or less, adult 04/18/2021   Pancreatic cyst 09/18/2016   COPD (chronic obstructive pulmonary disease) (HCC) 04/23/2016   Mild airflow obstruction on pulmonary function test 03/18/2016   Abnormal PFT 03/18/2016   Diffusion capacity of lung (dl), decreased 40/98/1191   Abnormal CT of the chest 02/16/2016   Pulmonary nodules 02/16/2016   Recurrent cervical cancer (HCC) 03/04/2015   Abdominal wall mass of left upper quadrant 01/13/2015   Cervical cancer, FIGO stage IB1 (HCC) 11/20/2014   Dense breast tissue 11/20/2014   Cervical cancer (HCC) 04/12/2014   Ovarian mass 04/12/2014   Loss of weight 03/02/2014   Essential hypertension 03/02/2014   Nicotine dependence 03/02/2014   Past Medical History:  Diagnosis Date   Cervical cancer (HCC) cervical   Hyperlipidemia    Hypertension     Radiation 06/02/14-07/13/14   pelvis 45 gray, vaginal cuff/parametrial boost to 50.4 gray   Radiation 04/06/15-05/13/15   anterior abdominal wall 50.4 gray   Family History  Problem Relation Age of Onset   Hypertension Father    Breast cancer Maternal Aunt        breast cancer, x 4 aunts    Breast cancer Daughter    Cancer Other    Diabetes Other    Colon cancer Neg Hx    Colon polyps Neg Hx    Esophageal cancer Neg Hx    Stomach cancer Neg Hx    Rectal cancer Neg Hx    Neuropathy Neg Hx    Past Surgical History:  Procedure Laterality Date   ABDOMINAL HYSTERECTOMY     COLONOSCOPY  2011   MUSCLE BIOPSY Right 09/14/2021   Procedure: MUSCLE BIOPSY RIGHT THIGH AND RIGHT LOWER LEG;  Surgeon: Almond Lint, MD;  Location: [facility] OR;  Service: General;  Laterality: Right;  robotic radical hysterectomy, bso, pelvic lymphadenectomy Bilateral 04/21/14   TUBAL LIGATION     Social History   Occupational History   Occupation: [employer]     Employer: [employer]  Tobacco Use   Smoking status: Former    Current packs/day: 0.00    Average packs/day: 0.5 packs/day for 30.0 years (15.0 ttl pk-yrs)    Types: Cigarettes    Start date: 09/11/1992    Quit date: 09/12/2022    Years since quitting: 0.7   Smokeless tobacco: Never  Vaping Use   Vaping status: Never Used  Substance and Sexual Activity   Alcohol use: No    Alcohol/week: 0.0 standard drinks of alcohol   Drug use: No   Sexual activity: Not Currently

## 2023-06-13 ENCOUNTER — Telehealth (HOSPITAL_COMMUNITY): Payer: Self-pay

## 2023-06-13 NOTE — Telephone Encounter (Signed)
Pt was initially called to inform her of SET program launching next year. However, pt informed me that she is longer interested in the program. Will continue to follow. Marland Kitchen

## 2023-06-17 ENCOUNTER — Other Ambulatory Visit: Payer: Self-pay

## 2023-06-17 ENCOUNTER — Ambulatory Visit: Payer: Medicare HMO | Attending: Family Medicine | Admitting: Family Medicine

## 2023-06-17 ENCOUNTER — Encounter: Payer: Self-pay | Admitting: Family Medicine

## 2023-06-17 VITALS — BP 124/79 | HR 90 | Wt 157.4 lb

## 2023-06-17 DIAGNOSIS — R09A2 Foreign body sensation, throat: Secondary | ICD-10-CM | POA: Diagnosis not present

## 2023-06-17 DIAGNOSIS — J449 Chronic obstructive pulmonary disease, unspecified: Secondary | ICD-10-CM

## 2023-06-17 DIAGNOSIS — E785 Hyperlipidemia, unspecified: Secondary | ICD-10-CM | POA: Diagnosis not present

## 2023-06-17 DIAGNOSIS — R7989 Other specified abnormal findings of blood chemistry: Secondary | ICD-10-CM | POA: Diagnosis not present

## 2023-06-17 DIAGNOSIS — I739 Peripheral vascular disease, unspecified: Secondary | ICD-10-CM | POA: Diagnosis not present

## 2023-06-17 DIAGNOSIS — I10 Essential (primary) hypertension: Secondary | ICD-10-CM | POA: Diagnosis not present

## 2023-06-17 DIAGNOSIS — R7303 Prediabetes: Secondary | ICD-10-CM | POA: Diagnosis not present

## 2023-06-17 DIAGNOSIS — M810 Age-related osteoporosis without current pathological fracture: Secondary | ICD-10-CM

## 2023-06-17 DIAGNOSIS — M5416 Radiculopathy, lumbar region: Secondary | ICD-10-CM | POA: Diagnosis not present

## 2023-06-17 DIAGNOSIS — E039 Hypothyroidism, unspecified: Secondary | ICD-10-CM | POA: Diagnosis not present

## 2023-06-17 MED ORDER — AMLODIPINE BESYLATE 10 MG PO TABS
10.0000 mg | ORAL_TABLET | Freq: Every day | ORAL | 1 refills | Status: DC
  Filled 2023-06-17 – 2023-08-15 (×2): qty 90, 90d supply, fill #0
  Filled 2023-12-13: qty 90, 90d supply, fill #1

## 2023-06-17 MED ORDER — ALBUTEROL SULFATE HFA 108 (90 BASE) MCG/ACT IN AERS
2.0000 | INHALATION_SPRAY | Freq: Four times a day (QID) | RESPIRATORY_TRACT | 2 refills | Status: AC | PRN
  Filled 2023-06-17: qty 18, 25d supply, fill #0

## 2023-06-17 NOTE — Progress Notes (Signed)
Subjective:  Patient ID: Martha Koch, female    DOB: 1955/11/18  Age: 67 y.o. MRN: [id number]  CC: throat (Patient states she still feeling like there is something in her throat, no pain )   HPI Martha Koch is a 67 y.o. year old female with a history of hypertension, prediabetes, Emphysema, previous history of cervical cancer (status post total abdominal hysterectomy and salpingo-oophorectomy, s/p chemo and radiation), Previous nicotine dependence (quit in 08/2022), PVD, lumbar radiculopathy to who presents today for chronic disease management.     Interval History: Discussed the use of AI scribe software for clinical note transcription with the patient, who gave verbal consent to proceed.  She presents with a persistent sensation of something in her throat. This sensation has been ongoing for a year and is not affected by eating or drinking. Previous treatment with a nasal spray for suspected allergies has not resolved the issue.  The patient also reports weakness and fatigue in her legs, which becomes pronounced after a short period of walking.  Pain radiates down both legs right greater than left.  This issue has been ongoing for four years and has not improved despite walking therapy. The patient has seen both a vascular doctor and an orthopedic specialist for these issues. She has stopped smoking and is on medication for her blood pressure and Cilostazol.last orthopedic visit with a back specialist was 6 days ago.   She has also been diagnosed with prediabetes with an A1c of 6.3.  Her emphysema is stable with no flares. She does have osteoporosis and I had prescribed Fosamax for her which she had to discontinue due to worsening of her GERD. She also had thyroid labs done which revealed elevated TSH and she was started on levothyroxine by the physician assistant however she complained of reflux with this as well despite taking levothyroxine first thing in the morning on an empty  stomach.  Since she discontinued levothyroxine his symptoms have resolved.       Past Medical History:  Diagnosis Date   Cervical cancer (HCC) cervical   Hyperlipidemia    Hypertension    Radiation 06/02/14-07/13/14   pelvis 45 gray, vaginal cuff/parametrial boost to 50.4 gray   Radiation 04/06/15-05/13/15   anterior abdominal wall 50.4 gray    Past Surgical History:  Procedure Laterality Date   ABDOMINAL HYSTERECTOMY     COLONOSCOPY  2011   MUSCLE BIOPSY Right 09/14/2021   Procedure: MUSCLE BIOPSY RIGHT THIGH AND RIGHT LOWER LEG;  Surgeon: Almond Lint, MD;  Location: [facility] OR;  Service: General;  Laterality: Right;   robotic radical hysterectomy, bso, pelvic lymphadenectomy Bilateral 04/21/14   TUBAL LIGATION      Family History  Problem Relation Age of Onset   Hypertension Father    Breast cancer Maternal Aunt        breast cancer, x 4 aunts    Breast cancer Daughter    Cancer Other    Diabetes Other    Colon cancer Neg Hx    Colon polyps Neg Hx    Esophageal cancer Neg Hx    Stomach cancer Neg Hx    Rectal cancer Neg Hx    Neuropathy Neg Hx     Social History   Socioeconomic History   Marital status: Single    Spouse name: Not on file   Number of children: 3   Years of education: 12   Highest education level: Not on file  Occupational History   Occupation:  [employer]     Employer: [employer]  Tobacco Use   Smoking status: Former    Current packs/day: 0.00    Average packs/day: 0.5 packs/day for 30.0 years (15.0 ttl pk-yrs)    Types: Cigarettes    Start date: 09/11/1992    Quit date: 09/12/2022    Years since quitting: 0.7   Smokeless tobacco: Never  Vaping Use   Vaping status: Never Used  Substance and Sexual Activity   Alcohol use: No    Alcohol/week: 0.0 standard drinks of alcohol   Drug use: No   Sexual activity: Not Currently  Other Topics Concern   Not on file  Social History Narrative   Has 3 children. They live in [place].   Has 3  grandchildren.    Martha Koch (oldest daughter)    Lives alone.    Social Drivers of Corporate investment banker Strain: Low Risk  (10/03/2022)   Overall Financial Resource Strain (CARDIA)    Difficulty of Paying Living Expenses: Not hard at all  Food Insecurity: Low Risk  (05/08/2023)   Received from [facility]   Hunger Vital Sign    Worried About Running Out of Food in the Last Year: Never true    Ran Out of Food in the Last Year: Never true  Transportation Needs: No Transportation Needs (05/08/2023)   Received from [facility]    In the past 12 months, has lack of reliable transportation kept you from medical appointments, meetings, work or from getting things needed for daily living? : No  Physical Activity: Inactive (10/03/2022)   Exercise Vital Sign    Days of Exercise per Week: 0 days    Minutes of Exercise per Session: 0 min  Stress: No Stress Concern Present (10/03/2022)   [employer] of Occupational Health - Occupational Stress Questionnaire    Feeling of Stress : Not at all  Social Connections: Not on file    Allergies  Allergen Reactions   Aspirin     Stomach cramps   Penicillins Hives    Did it involve swelling of the face/tongue/throat, SOB, or low BP? No Did it involve sudden or severe rash/hives, skin peeling, or any reaction on the inside of your mouth or nose? Yes Did you need to seek medical attention at a hospital or doctor's office? Yes When did it last happen?    Several Years Ago   If all above answers are "NO", may proceed with cephalosporin use.      Outpatient Medications Prior to Visit  Medication Sig Dispense Refill   albuterol (PROVENTIL) (2.5 MG/3ML) 0.083% nebulizer solution Take 3 mLs (2.5 mg total) by nebulization every 6 (six) hours as needed for wheezing or shortness of breath. 180 mL 1   aspirin EC 81 MG tablet Take by mouth.     atorvastatin (LIPITOR) 80 MG tablet Take 1 tablet (80 mg total) by mouth at bedtime.  90 tablet 3   cetirizine (ZYRTEC) 10 MG tablet Take 1 tablet (10 mg total) by mouth daily. 100 tablet 1   cholecalciferol 25 MCG (1000 UT) tablet Take 1,000 Units by mouth daily.     cilostazol (PLETAL) 100 MG tablet Take 1 tablet (100 mg total) by mouth 2 (two) times daily. 180 tablet 3   fluticasone (FLONASE) 50 MCG/ACT nasal spray Place 2 sprays into both nostrils daily. 16 g 6   levothyroxine (SYNTHROID) 50 MCG tablet Take 1 tablet (50 mcg total) by mouth daily.  90 tablet 1   Tiotropium Bromide-Olodaterol (STIOLTO RESPIMAT) 2.5-2.5 MCG/ACT AERS Inhale 2 puffs into the lungs daily. 4 g 6   albuterol (VENTOLIN HFA) 108 (90 Base) MCG/ACT inhaler Inhale 2 puffs into the lungs every 6 (six) hours as needed for wheezing or shortness of breath. 18 g 2   amLODipine (NORVASC) 10 MG tablet Take 1 tablet (10 mg total) by mouth daily. 90 tablet 1   Facility-Administered Medications Prior to Visit  Medication Dose Route Frequency Provider Last Rate Last Admin   acetaminophen (TYLENOL) tablet 650 mg  650 mg Oral Q6H PRN Storm Frisk, MD   650 mg at 03/14/23 0922     ROS Review of Systems  Constitutional:  Negative for activity change and appetite change.  HENT:  Negative for sinus pressure and sore throat.   Respiratory:  Negative for chest tightness, shortness of breath and wheezing.   Cardiovascular:  Negative for chest pain and palpitations.  Gastrointestinal:  Negative for abdominal distention, abdominal pain and constipation.  Genitourinary: Negative.   Musculoskeletal: Negative.   Psychiatric/Behavioral:  Negative for behavioral problems and dysphoric mood.     Objective:  BP 124/79 (BP Location: Left Arm, Patient Position: Sitting, Cuff Size: Normal)   Pulse 90   Wt 157 lb 6.4 oz (71.4 kg)   SpO2 98%   BMI 25.41 kg/m      06/17/2023   10:05 AM 05/28/2023    2:37 PM 05/16/2023    1:39 PM  BP/Weight  Systolic BP 124 133 133  Diastolic BP 79 79 85  Wt. (Lbs) 157.4 151 152.8   BMI 25.41 kg/m2 24.37 kg/m2 24.66 kg/m2      Physical Exam Constitutional:      Appearance: She is well-developed.  HENT:     Right Ear: Tympanic membrane normal.     Left Ear: Tympanic membrane normal.     Mouth/Throat:     Mouth: Mucous membranes are moist.  Cardiovascular:     Rate and Rhythm: Normal rate.     Heart sounds: Normal heart sounds. No murmur heard. Pulmonary:     Effort: Pulmonary effort is normal.     Breath sounds: Normal breath sounds. No wheezing or rales.  Chest:     Chest wall: No tenderness.  Abdominal:     General: Bowel sounds are normal. There is no distension.     Palpations: Abdomen is soft. There is no mass.     Tenderness: There is no abdominal tenderness.  Musculoskeletal:        General: Normal range of motion.     Right lower leg: No edema.     Left lower leg: No edema.  Neurological:     Mental Status: She is alert and oriented to person, place, and time.  Psychiatric:        Mood and Affect: Mood normal.        Latest Ref Rng & Units 05/16/2023    2:08 PM 02/08/2023   11:05 AM 09/17/2022    4:06 AM  CMP  Glucose 70 - 99 mg/dL 85  784  696   BUN 8 - 27 mg/dL 6  9  12    Creatinine 0.57 - 1.00 mg/dL 2.95  2.84  1.32   Sodium 134 - 144 mmol/L 146  144  139   Potassium 3.5 - 5.2 mmol/L 4.0  4.3  3.9   Chloride 96 - 106 mmol/L 106  107  108   CO2 20 - 29  mmol/L 16  22  22    Calcium 8.7 - 10.3 mg/dL 16.1  9.5  8.6   Total Protein 6.0 - 8.5 g/dL 7.5  6.7    Total Bilirubin 0.0 - 1.2 mg/dL 0.2  0.3    Alkaline Phos 44 - 121 IU/L 114  93    AST 0 - 40 IU/L 24  37    ALT 0 - 32 IU/L 14  42      Lipid Panel     Component Value Date/Time   CHOL 146 02/08/2023 1105   TRIG 49 02/08/2023 1105   HDL 67 02/08/2023 1105   CHOLHDL 2.4 04/18/2021 1600   CHOLHDL 2.9 08/26/2015 0945   VLDL 12 08/26/2015 0945   LDLCALC 68 02/08/2023 1105    CBC    Component Value Date/Time   WBC 4.8 05/16/2023 1408   WBC 3.3 (L) 09/17/2022 0406   RBC  5.36 (H) 05/16/2023 1408   RBC 5.04 09/17/2022 0406   HGB 13.4 05/16/2023 1408   HGB 12.8 11/18/2014 0902   HCT 42.0 05/16/2023 1408   HCT 38.9 11/18/2014 0902   PLT 423 05/16/2023 1408   MCV 78 (L) 05/16/2023 1408   MCV 82.4 11/18/2014 0902   MCH 25.0 (L) 05/16/2023 1408   MCH 26.4 09/17/2022 0406   MCHC 31.9 05/16/2023 1408   MCHC 32.2 09/17/2022 0406   RDW 15.3 05/16/2023 1408   RDW 13.2 11/18/2014 0902   LYMPHSABS 2.0 05/16/2023 1408   LYMPHSABS 0.9 11/18/2014 0902   MONOABS 0.2 09/17/2022 0406   MONOABS 0.3 11/18/2014 0902   EOSABS 0.1 05/16/2023 1408   BASOSABS 0.0 05/16/2023 1408   BASOSABS 0.0 11/18/2014 0902    Lab Results  Component Value Date   HGBA1C 6.3 (H) 02/08/2023    Lab Results  Component Value Date   TSH 14.700 (H) 05/16/2023    Assessment & Plan:      Foreign Body Sensation in Throat Persistent despite allergy treatment. No impact on eating or drinking. -Refer to Ear, Nose, and Throat specialist for further evaluation. Continue with nasal steroids and antihistamine.  COPD Stable, no exacerbations or worsening symptoms in cold weather. -Continue Albuterol, Flonase, and Spiriva as prescribed. -Commended on quitting smoking  Peripheral Vascular Disease Patient reports weakness in legs, particularly with walking. Currently on Cilostazol, but patient does not feel it is effective. -ABI revealed moderate disease in both lower extremities from 05/2023 -Continue Cilostazol. -Refer for water therapy for chronic back pain and leg weakness.  Lumbar Radiculopathy Patient reports weakness in both legs, likely due to combination of peripheral vascular disease and lumbar radiculopathy. -Refer for water therapy for chronic back pain and leg weakness. -Will look to place him on Xarelto or gabapentin although she declines initiation of medication  Hypothyroidism Patient stopped Levothyroxine due to heartburn. Previous lab results showed elevated TSH on one  occasion. -Check thyroid levels today. -If abnormal, consider starting medication for reflux to allow patient to tolerate Levothyroxine.  Prediabetes A1c of 6.3, close to diabetes threshold 5 -She is not open to initiating medications -Advise patient to limit starch and sweets in diet. -Repeat A1c at next visit  Hypertension Well controlled, patient quit smoking and continues Amlodipine. -Continue Amlodipine. -Counseled on blood pressure goal of less than 130/80, low-sodium, DASH diet, medication compliance, 150 minutes of moderate intensity exercise per week. Discussed medication compliance, adverse effects.   Hyperlipidemia Patient on statin therapy. -Continue statin therapy.  Refills Patient needs refills on Amlodipine, statin, Cilostazol,  and Levothyroxine (if thyroid levels are abnormal). -Refill medications as needed.          Meds ordered this encounter  Medications   albuterol (VENTOLIN HFA) 108 (90 Base) MCG/ACT inhaler    Sig: Inhale 2 puffs into the lungs every 6 (six) hours as needed for wheezing or shortness of breath.    Dispense:  18 g    Refill:  2   amLODipine (NORVASC) 10 MG tablet    Sig: Take 1 tablet (10 mg total) by mouth daily.    Dispense:  90 tablet    Refill:  1    Follow-up: Return in about 6 months (around 12/16/2023) for Chronic medical conditions.       Hoy Register, MD, FAAFP. [facility] [place], [place] [phone]   06/17/2023, 1:00 PM

## 2023-06-17 NOTE — Patient Instructions (Signed)
Prediabetes Eating Plan Prediabetes is a condition that causes blood sugar (glucose) levels to be higher than normal. This increases the risk for developing type 2 diabetes (type 2 diabetes mellitus). Working with a health care provider or nutrition specialist (dietitian) to make diet and lifestyle changes can help prevent the onset of diabetes. These changes may help you: Control your blood glucose levels. Improve your cholesterol levels. Manage your blood pressure. What are tips for following this plan? Reading food labels Read food labels to check the amount of fat, salt (sodium), and sugar in prepackaged foods. Avoid foods that have: Saturated fats. Trans fats. Added sugars. Avoid foods that have more than 300 milligrams (mg) of sodium per serving. Limit your sodium intake to less than 2,300 mg each day. Shopping Avoid buying pre-made and processed foods. Avoid buying drinks with added sugar. Cooking Cook with olive oil. Do not use butter, lard, or ghee. Bake, broil, grill, steam, or boil foods. Avoid frying. Meal planning  Work with your dietitian to create an eating plan that is right for you. This may include tracking how many calories you take in each day. Use a food diary, notebook, or mobile application to track what you eat at each meal. Consider following a Mediterranean diet. This includes: Eating several servings of fresh fruits and vegetables each day. Eating fish at least twice a week. Eating one serving each day of whole grains, beans, nuts, and seeds. Using olive oil instead of other fats. Limiting alcohol. Limiting red meat. Using nonfat or low-fat dairy products. Consider following a plant-based diet. This includes dietary choices that focus on eating mostly vegetables and fruit, grains, beans, nuts, and seeds. If you have high blood pressure, you may need to limit your sodium intake or follow a diet such as the DASH (Dietary Approaches to Stop Hypertension) eating  plan. The DASH diet aims to lower high blood pressure. Lifestyle Set weight loss goals with help from your health care team. It is recommended that most people with prediabetes lose 7% of their body weight. Exercise for at least 30 minutes 5 or more days a week. Attend a support group or seek support from a mental health counselor. Take over-the-counter and prescription medicines only as told by your health care provider. What foods are recommended? Fruits Berries. Bananas. Apples. Oranges. Grapes. Papaya. Mango. Pomegranate. Kiwi. Grapefruit. Cherries. Vegetables Lettuce. Spinach. Peas. Beets. Cauliflower. Cabbage. Broccoli. Carrots. Tomatoes. Squash. Eggplant. Herbs. Peppers. Onions. Cucumbers. Brussels sprouts. Grains Whole grains, such as whole-wheat or whole-grain breads, crackers, cereals, and pasta. Unsweetened oatmeal. Bulgur. Barley. Quinoa. Brown rice. Corn or whole-wheat flour tortillas or taco shells. Meats and other proteins Seafood. Poultry without skin. Lean cuts of pork and beef. Tofu. Eggs. Nuts. Beans. Dairy Low-fat or fat-free dairy products, such as yogurt, cottage cheese, and cheese. Beverages Water. Tea. Coffee. Sugar-free or diet soda. Seltzer water. Low-fat or nonfat milk. Milk alternatives, such as soy or almond milk. Fats and oils Olive oil. Canola oil. Sunflower oil. Grapeseed oil. Avocado. Walnuts. Sweets and desserts Sugar-free or low-fat pudding. Sugar-free or low-fat ice cream and other frozen treats. Seasonings and condiments Herbs. Sodium-free spices. Mustard. Relish. Low-salt, low-sugar ketchup. Low-salt, low-sugar barbecue sauce. Low-fat or fat-free mayonnaise. The items listed above may not be a complete list of recommended foods and beverages. Contact a dietitian for more information. What foods are not recommended? Fruits Fruits canned with syrup. Vegetables Canned vegetables. Frozen vegetables with butter or cream sauce. Grains Refined white  flour and flour   products, such as bread, pasta, snack foods, and cereals. Meats and other proteins Fatty cuts of meat. Poultry with skin. Breaded or fried meat. Processed meats. Dairy Full-fat yogurt, cheese, or milk. Beverages Sweetened drinks, such as iced tea and soda. Fats and oils Butter. Lard. Ghee. Sweets and desserts Baked goods, such as cake, cupcakes, pastries, cookies, and cheesecake. Seasonings and condiments Spice mixes with added salt. Ketchup. Barbecue sauce. Mayonnaise. The items listed above may not be a complete list of foods and beverages that are not recommended. Contact a dietitian for more information. Where to find more information American Diabetes Association: www.diabetes.org Summary You may need to make diet and lifestyle changes to help prevent the onset of diabetes. These changes can help you control blood sugar, improve cholesterol levels, and manage blood pressure. Set weight loss goals with help from your health care team. It is recommended that most people with prediabetes lose 7% of their body weight. Consider following a Mediterranean diet. This includes eating plenty of fresh fruits and vegetables, whole grains, beans, nuts, seeds, fish, and low-fat dairy, and using olive oil instead of other fats. This information is not intended to replace advice given to you by your health care provider. Make sure you discuss any questions you have with your health care provider. Document Revised: 09/17/2019 Document Reviewed: 09/17/2019 Elsevier Patient Education  2024 [employer]  

## 2023-06-18 ENCOUNTER — Other Ambulatory Visit: Payer: Self-pay | Admitting: Family Medicine

## 2023-06-18 ENCOUNTER — Other Ambulatory Visit: Payer: Self-pay

## 2023-06-18 LAB — T3: T3, Total: 174 ng/dL (ref 71–180)

## 2023-06-18 LAB — T4, FREE: Free T4: 0.85 ng/dL (ref 0.82–1.77)

## 2023-06-18 LAB — TSH: TSH: 17 u[IU]/mL — ABNORMAL HIGH (ref 0.450–4.500)

## 2023-06-18 MED ORDER — OMEPRAZOLE 40 MG PO CPDR
40.0000 mg | DELAYED_RELEASE_CAPSULE | Freq: Every day | ORAL | 3 refills | Status: DC
  Filled 2023-06-18: qty 30, 30d supply, fill #0
  Filled 2023-12-13: qty 30, 30d supply, fill #1

## 2023-06-20 ENCOUNTER — Other Ambulatory Visit: Payer: Self-pay

## 2023-06-28 ENCOUNTER — Other Ambulatory Visit: Payer: Self-pay

## 2023-06-28 ENCOUNTER — Ambulatory Visit (INDEPENDENT_AMBULATORY_CARE_PROVIDER_SITE_OTHER): Payer: Medicare HMO | Admitting: Physical Medicine and Rehabilitation

## 2023-06-28 VITALS — BP 166/79 | HR 99

## 2023-06-28 DIAGNOSIS — M5416 Radiculopathy, lumbar region: Secondary | ICD-10-CM

## 2023-06-28 MED ORDER — METHYLPREDNISOLONE ACETATE 40 MG/ML IJ SUSP
40.0000 mg | Freq: Once | INTRAMUSCULAR | Status: AC
  Administered 2023-06-28: 40 mg

## 2023-06-28 NOTE — Progress Notes (Signed)
Martha Koch - 67 y.o. female MRN [id number]  Date of birth: 05-31-1956  Office Visit Note: Visit Date: 06/28/2023 PCP: Hoy Register, MD Referred by: Hoy Register, MD  Subjective: Chief Complaint  Patient presents with   Lower Back - Pain   HPI:  Martha Koch is a 67 y.o. female who comes in today at the request of Ellin Goodie, FNP for planned Right L5-S1 Lumbar Interlaminar epidural steroid injection with fluoroscopic guidance.  The patient has failed conservative care including home exercise, medications, time and activity modification.  This injection will be diagnostic and hopefully therapeutic.  Please see requesting physician notes for further details and justification.   ROS Otherwise per HPI.  Assessment & Plan: Visit Diagnoses:    ICD-10-CM   1. Lumbar radiculopathy  M54.16 XR C-ARM NO REPORT    Epidural Steroid injection    methylPREDNISolone acetate (DEPO-MEDROL) injection 40 mg      Plan: No additional findings.   Meds & Orders:  Meds ordered this encounter  Medications   methylPREDNISolone acetate (DEPO-MEDROL) injection 40 mg    Orders Placed This Encounter  Procedures   XR C-ARM NO REPORT   Epidural Steroid injection    Follow-up: Return if symptoms worsen or fail to improve, for 2 to 3 weeks.   Procedures: No procedures performed  Lumbar Epidural Steroid Injection - Interlaminar Approach with Fluoroscopic Guidance  Patient: Martha Koch      Date of Birth: 27-Sep-1955 MRN: [id number] PCP: Hoy Register, MD      Visit Date: 06/28/2023   Universal Protocol:     Consent Given By: the patient  Position: PRONE  Additional Comments: Vital signs were monitored before and after the procedure. Patient was prepped and draped in the usual sterile fashion. The correct patient, procedure, and site was verified.   Injection Procedure Details:   Procedure diagnoses: Lumbar radiculopathy [M54.16]   Meds Administered:  Meds  ordered this encounter  Medications   methylPREDNISolone acetate (DEPO-MEDROL) injection 40 mg     Laterality: Right  Location/Site:  L5-S1  Needle: 3.5 in., 20 ga. Tuohy  Needle Placement: Paramedian epidural  Findings:   -Comments: Excellent flow of contrast into the epidural space.  Procedure Details: Using a paramedian approach from the side mentioned above, the region overlying the inferior lamina was localized under fluoroscopic visualization and the soft tissues overlying this structure were infiltrated with 4 ml. of 1% Lidocaine without Epinephrine. The Tuohy needle was inserted into the epidural space using a paramedian approach.   The epidural space was localized using loss of resistance along with counter oblique bi-planar fluoroscopic views.  After negative aspirate for air, blood, and CSF, a 2 ml. volume of Isovue-250 was injected into the epidural space and the flow of contrast was observed. Radiographs were obtained for documentation purposes.    The injectate was administered into the level noted above.   Additional Comments:  No complications occurred Dressing: 2 x 2 sterile gauze and Band-Aid    Post-procedure details: Patient was observed during the procedure. Post-procedure instructions were reviewed.  Patient left the clinic in stable condition.   Clinical History: Jordan Likes, MD - 08/29/2022  Formatting of this note might be different from the original.  MRI LUMBAR SPINE WITH AND WITHOUT CONTRAST, 07/12/2022 11:54 AM   INDICATION: Ataxia, nontraumatic, lumbar pathology suspected \ M62.569 Atrophy of muscle of lower leg, unspecified laterality   COMPARISON: None   TECHNIQUE: Multiplanar, multisequence surface-coil magnetic resonance imaging  of the lumbar spine was performed before and after intravenous administration of gadolinium-based contrast media.    LEVELS IMAGED: Lower thoracic to the upper sacral region.   FINDINGS:     Alignment:  No substantial subluxation.   Vertebrae:  Vertebral body heights are maintained. Diffuse heterogeneous marrow signal is nonspecific, but can be seen in individuals with chronic anemia, chronic hypoxemia (such as smoking) versus other marrow infiltrative/myeloproliferative disorders.   Conus medullaris:  In normal position. Normal signal and contour.   Degenerative changes:   T12-L1:  No substantial canal or foraminal stenosis.  L1-L2: Facet joint hypertrophy. No substantial canal or foraminal stenosis.  L2-L3: Facet joint and ligamentum flavum hypertrophy. No substantial canal or foraminal stenosis.  L3-L4:  Facet joint and ligamentum flavum hypertrophy. Small left facet joint effusion. No substantial canal or foraminal stenosis.  L4-L5: Disc desiccation. Facet joint and ligamentum flavum hypertrophy. Broad-based disc bulge. No substantial canal or foraminal stenosis.  L5-S1: Disc desiccation. Facet joint and ligamentum flavum hypertrophy. Broad-based disc bulge with superimposed left paracentral small disc herniation. Effacement of the left subarticular recess. Mild bilateral foraminal stenosis.  No substantial canal stenosis.   Upper sacrum:  No focal lesion identified.   Contrast:  No abnormal contrast enhancement identified.   Additional comments:  None.    IMPRESSION:   1. Broad-based disc bulge with superimposed left paracentral disc herniation at L5-S1 results in effacement of the descending left S1 nerve root and mild bilateral foraminal stenosis.  2.  No substantial canal stenosis in the lumbar spine.     Objective:  VS:  HT:    WT:   BMI:     BP:(!) 166/79  HR:99bpm  TEMP: ( )  RESP:  Physical Exam Vitals and nursing note reviewed.  Constitutional:      General: She is not in acute distress.    Appearance: Normal appearance. She is not ill-appearing.  HENT:     Head: Normocephalic and atraumatic.     Right Ear: External ear normal.     Left Ear:  External ear normal.  Eyes:     Extraocular Movements: Extraocular movements intact.  Cardiovascular:     Rate and Rhythm: Normal rate.     Pulses: Normal pulses.  Pulmonary:     Effort: Pulmonary effort is normal. No respiratory distress.  Abdominal:     General: There is no distension.     Palpations: Abdomen is soft.  Musculoskeletal:        General: Tenderness present.     Cervical back: Neck supple.     Right lower leg: No edema.     Left lower leg: No edema.     Comments: Patient has good distal strength with no pain over the greater trochanters.  No clonus or focal weakness.  Skin:    Findings: No erythema, lesion or rash.  Neurological:     General: No focal deficit present.     Mental Status: She is alert and oriented to person, place, and time.     Sensory: No sensory deficit.     Motor: No weakness or abnormal muscle tone.     Coordination: Coordination normal.  Psychiatric:        Mood and Affect: Mood normal.        Behavior: Behavior normal.      Imaging: No results found.

## 2023-06-28 NOTE — Procedures (Signed)
Lumbar Epidural Steroid Injection - Interlaminar Approach with Fluoroscopic Guidance  Patient: Martha Koch      Date of Birth: [birth year] MRN: [id number] PCP: Hoy Register, MD      Visit Date: 06/28/2023   Universal Protocol:     Consent Given By: the patient  Position: PRONE  Additional Comments: Vital signs were monitored before and after the procedure. Patient was prepped and draped in the usual sterile fashion. The correct patient, procedure, and site was verified.   Injection Procedure Details:   Procedure diagnoses: Lumbar radiculopathy [M54.16]   Meds Administered:  Meds ordered this encounter  Medications   methylPREDNISolone acetate (DEPO-MEDROL) injection 40 mg     Laterality: Right  Location/Site:  L5-S1  Needle: 3.5 in., 20 ga. Tuohy  Needle Placement: Paramedian epidural  Findings:   -Comments: Excellent flow of contrast into the epidural space.  Procedure Details: Using a paramedian approach from the side mentioned above, the region overlying the inferior lamina was localized under fluoroscopic visualization and the soft tissues overlying this structure were infiltrated with 4 ml. of 1% Lidocaine without Epinephrine. The Tuohy needle was inserted into the epidural space using a paramedian approach.   The epidural space was localized using loss of resistance along with counter oblique bi-planar fluoroscopic views.  After negative aspirate for air, blood, and CSF, a 2 ml. volume of Isovue-250 was injected into the epidural space and the flow of contrast was observed. Radiographs were obtained for documentation purposes.    The injectate was administered into the level noted above.   Additional Comments:  No complications occurred Dressing: 2 x 2 sterile gauze and Band-Aid    Post-procedure details: Patient was observed during the procedure. Post-procedure instructions were reviewed.  Patient left the clinic in stable condition.

## 2023-06-28 NOTE — Patient Instructions (Signed)

## 2023-06-28 NOTE — Progress Notes (Signed)
Functional Pain Scale - descriptive words and definitions  Distracting (5)    Aware of pain/able to complete some ADL's but limited by pain/sleep is affected and active distractions are only slightly useful. Moderate range order  Average Pain 7   +Driver, -BT, -Dye Allergies. Patient is taking Cilostazol 100mg 

## 2023-07-02 ENCOUNTER — Other Ambulatory Visit: Payer: Self-pay

## 2023-07-08 ENCOUNTER — Other Ambulatory Visit: Payer: Self-pay

## 2023-07-08 DIAGNOSIS — R09A2 Foreign body sensation, throat: Secondary | ICD-10-CM | POA: Diagnosis not present

## 2023-07-08 DIAGNOSIS — R0989 Other specified symptoms and signs involving the circulatory and respiratory systems: Secondary | ICD-10-CM | POA: Diagnosis not present

## 2023-07-10 DIAGNOSIS — I739 Peripheral vascular disease, unspecified: Secondary | ICD-10-CM | POA: Diagnosis not present

## 2023-07-18 NOTE — Therapy (Unsigned)
OUTPATIENT PHYSICAL THERAPY THORACOLUMBAR EVALUATION   Patient Name: Martha Koch MRN: [id number] DOB:12/09/1955, 68 y.o., female Today's Date: 07/19/2023  END OF SESSION:  PT End of Session - 07/19/23 1128     Visit Number 1    Number of Visits 8    Date for PT Re-Evaluation 08/23/23    PT Start Time 1032    PT Stop Time 1115    PT Time Calculation (min) 43 min    Activity Tolerance Patient tolerated treatment well    Behavior During Therapy [facility] for tasks assessed/performed             Past Medical History:  Diagnosis Date   Cervical cancer (HCC) cervical   Hyperlipidemia    Hypertension    Radiation 06/02/14-07/13/14   pelvis 45 gray, vaginal cuff/parametrial boost to 50.4 gray   Radiation 04/06/15-05/13/15   anterior abdominal wall 50.4 gray   Past Surgical History:  Procedure Laterality Date   ABDOMINAL HYSTERECTOMY     COLONOSCOPY  2011   MUSCLE BIOPSY Right 09/14/2021   Procedure: MUSCLE BIOPSY RIGHT THIGH AND RIGHT LOWER LEG;  Surgeon: Almond Lint, MD;  Location: [facility] OR;  Service: General;  Laterality: Right;   robotic radical hysterectomy, bso, pelvic lymphadenectomy Bilateral 04/21/14   TUBAL LIGATION     Patient Active Problem List   Diagnosis Date Noted   Insomnia 05/09/2023   Tachycardia 05/09/2023   Cutaneous abscess of face 03/14/2023   Dermoid cyst 03/14/2023   Osteoporosis 01/10/2023   Former tobacco use 09/16/2022   Atrophy of muscle of right lower leg 08/23/2021   Dyslipidemia 04/18/2021   Prediabetes 04/18/2021   Body mass index (BMI) 19.9 or less, adult 04/18/2021   Pancreatic cyst 09/18/2016   COPD (chronic obstructive pulmonary disease) (HCC) 04/23/2016   Mild airflow obstruction on pulmonary function test 03/18/2016   Abnormal PFT 03/18/2016   Diffusion capacity of lung (dl), decreased 93/23/5573   Abnormal CT of the chest 02/16/2016   Pulmonary nodules 02/16/2016   Recurrent cervical cancer (HCC) 03/04/2015   Abdominal wall  mass of left upper quadrant 01/13/2015   Cervical cancer, FIGO stage IB1 (HCC) 11/20/2014   Dense breast tissue 11/20/2014   Cervical cancer (HCC) 04/12/2014   Ovarian mass 04/12/2014   Loss of weight 03/02/2014   Essential hypertension 03/02/2014   Nicotine dependence 03/02/2014    PCP: Hoy Register, MD   REFERRING PROVIDER: Hoy Register, MD   REFERRING DIAG: M54.16 (ICD-10-CM) - Lumbar radiculopathy   Rationale for Evaluation and Treatment: Rehabilitation  THERAPY DIAG:  Muscle weakness (generalized)  Other abnormalities of gait and mobility  ONSET DATE: 4 yrs march  SUBJECTIVE:  SUBJECTIVE STATEMENT: Started all of a sudden. Had 3 steroid injection in lumbar spine without relief.  Last one 2 weeks today.  They say it is coming from my back but I think it is in my legs.  My circulation in my legs is not good  PERTINENT HISTORY:  PVD and lumbar radiculopathy; COPD  PAIN:  Are you having pain? Yes: NPRS scale: current 0/10; worst 9/10; 0/10 Pain location: RLE Pain description: ache; heavy Aggravating factors: walking 500 ft Relieving factors: stopping and standing or sitting 2-10 mins  PRECAUTIONS: None  RED FLAGS: None   WEIGHT BEARING RESTRICTIONS: No  FALLS:  Has patient fallen in last 6 months? No  LIVING ENVIRONMENT: Lives with: lives alone Lives in: House/apartment Stairs:  1 outdoor Has following equipment at home: Single point cane  OCCUPATION: retired  PLOF: Independent  PATIENT GOALS: less pain, be able to stand longer  NEXT MD VISIT: 2 month  OBJECTIVE:  Note: Objective measures were completed at Evaluation unless otherwise noted.  DIAGNOSTIC FINDINGS:  FINDINGS: Normal alignment and preserved vertebral body heights. No acute compression fracture,  wedge-shaped deformity or focal kyphosis. Minimal endplate bony spurring at L3-4. Relatively preserved disc spaces. No pars defects. Minor SI joint sclerosis. Aortoiliac atherosclerosis noted. Overall stable exam.  PATIENT SURVEYS:  FOTO Primary score 51% with goal of 61%  COGNITION: Overall cognitive status: Within functional limits for tasks assessed     SENSATION: Heaviness with claudication rle from hip to foot after walking 500 ft   MUSCLE LENGTH: Hamstrings: WFL   POSTURE:  slight internal rotation of hips   LUMBAR ROM:   WFL  LOWER EXTREMITY ROM:     wfl  LOWER EXTREMITY MMT:    MMT Right eval Left eval  Hip flexion 36.9 47.9  Hip extension    Hip abduction 18.6 29.5  Hip adduction    Hip internal rotation    Hip external rotation    Knee flexion    Knee extension 41.8 wfl  Ankle dorsiflexion    Ankle plantarflexion    Ankle inversion    Ankle eversion     (Blank rows = not tested)  LUMBAR SPECIAL TESTS:  Straight leg raise test: Negative and Slump test: Negative  FUNCTIONAL TESTS:  Timed up and go (TUG): 11.09 5 x STS: 19.56  GAIT: Distance walked: 500 Assistive device utilized: None Level of assistance: Complete Independence Comments: normal cadence leads to slowed cadence and left leaning  TREATMENT  Eval                                                                                                                                PATIENT EDUCATION:  Education details: Discussed eval findings, rehab rationale, aquatic program progression/POC and pools in area. Patient is in agreement  Person educated: Patient Education method: Explanation Education comprehension: verbalized understanding  HOME EXERCISE PROGRAM: Aquatic TBA  ASSESSMENT:  CLINICAL IMPRESSION: Patient is a  68 y.o. f who was seen today for physical therapy evaluation and treatment for LBP with radiculopathy into rle.  After reviewing pts chart and assessing, it is  likely that her rle discomfort is coming from PVD /Aortoiliac atherosclerosis as it is presenting as intermittent claudication, having rle pain (ache and heaviness) with activity then relieving with rest. She reports (and is demonstrated today) limited ability to amb continuously for greater than ~ 400 ft without a standing or seated rest period before continuing. Rest period relieves pain completely. Pain increases with distance then completely relieves with rest. Objective testing indicates rle weakness compared to left (issue has been progressing over past 4 yrs R>L) short duration functional testing TUG wfl, 5 x STS (increased muscle exertion) slowed with pain.  She may be a good candidate fro aquatic intervention as the hydrostatic pressure may improve venous blood return allowing for increased circulation in LE.  With success she will be able to improve rle strength in setting.  OBJECTIVE IMPAIRMENTS: decreased activity tolerance, decreased endurance, decreased strength, and pain.   ACTIVITY LIMITATIONS: carrying, lifting, squatting, stairs, transfers, and locomotion level  PARTICIPATION LIMITATIONS: meal prep, cleaning, shopping, community activity, and yard work  PERSONAL FACTORS: 1-2 comorbidities: see PmHx  are also affecting patient's functional outcome.   REHAB POTENTIAL: Fair to good  CLINICAL DECISION MAKING: Evolving/moderate complexity  EVALUATION COMPLEXITY: High   GOALS: Goals reviewed with patient? Yes  SHORT TERM GOALS: Target date: 08/22/23  Pt to meet stated Foto Goal Baseline: Goal status: INITIAL  2.  Pt will amb continuously x 10 minutes submerged without increase in LE pain Baseline: unable to land based Goal status: INITIAL  3.  .Pt will improve strength in R hip to within 5lbs of contralateral side to demonstrate improved overall physical function Baseline: see chart Goal status: INITIAL  4.  Pt will tolerate full aquatic sessions consistently without  increase in pain and with improving function to demonstrate good toleration and effectiveness of intervention.  Baseline:  Goal status: INITIAL  5.  Pt will complete 10 consecutive STS transfers from water bench onto water step without limitation to le pain Baseline:  Goal status: INITIAL   LONG TERM GOALS: will set a re-cert if approp  PLAN:  PT FREQUENCY: 1-2x/week  PT DURATION: 5 weeks  PLANNED INTERVENTIONS: 97164- PT Re-evaluation, 97110-Therapeutic exercises, 97530- Therapeutic activity, 97112- Neuromuscular re-education, 97535- Self Care, 16109- Manual therapy, [id number]- Gait training, [phone]- Orthotic Fit/training, [phone]- Aquatic Therapy, [phone]- Electrical stimulation (unattended), Patient/Family education, Balance training, Stair training, Taping, Dry Needling, Joint mobilization, Cryotherapy, and Moist heat.  PLAN FOR NEXT SESSION: initial trial of aquatics submerged for toleration   Rushie Chestnut) Olivia Pavelko MPT 07/19/23 12:11 PM [facility] [address] [place], [place], [address] Phone: [phone]   Fax:  [phone]   Referring diagnosis? M54.16 (ICD-10-CM) - Lumbar radiculopathy  Treatment diagnosis? (if different than referring diagnosis) M54.16 (ICD-10-CM) - Lumbar radiculopathy  What was this (referring dx) caused by? []  Surgery []  Fall [x]  Ongoing issue [x]  Arthritis []  Other: ____________  Laterality: []  Rt []  Lt [x]  Both  Check all possible CPT codes:  *CHOOSE 10 OR LESS*    See Planned Interventions listed in the Plan section of the Evaluation.

## 2023-07-19 ENCOUNTER — Other Ambulatory Visit: Payer: Self-pay

## 2023-07-19 ENCOUNTER — Encounter (HOSPITAL_BASED_OUTPATIENT_CLINIC_OR_DEPARTMENT_OTHER): Payer: Self-pay | Admitting: Physical Therapy

## 2023-07-19 ENCOUNTER — Ambulatory Visit (HOSPITAL_BASED_OUTPATIENT_CLINIC_OR_DEPARTMENT_OTHER): Payer: Medicare HMO | Attending: Family Medicine | Admitting: Physical Therapy

## 2023-07-19 DIAGNOSIS — M5416 Radiculopathy, lumbar region: Secondary | ICD-10-CM | POA: Diagnosis not present

## 2023-07-19 DIAGNOSIS — R2689 Other abnormalities of gait and mobility: Secondary | ICD-10-CM | POA: Insufficient documentation

## 2023-07-19 DIAGNOSIS — M6281 Muscle weakness (generalized): Secondary | ICD-10-CM | POA: Insufficient documentation

## 2023-07-25 ENCOUNTER — Encounter (HOSPITAL_BASED_OUTPATIENT_CLINIC_OR_DEPARTMENT_OTHER): Payer: Self-pay | Admitting: Physical Therapy

## 2023-07-25 ENCOUNTER — Ambulatory Visit (HOSPITAL_BASED_OUTPATIENT_CLINIC_OR_DEPARTMENT_OTHER): Payer: Medicare HMO | Admitting: Physical Therapy

## 2023-07-25 DIAGNOSIS — R2689 Other abnormalities of gait and mobility: Secondary | ICD-10-CM | POA: Diagnosis not present

## 2023-07-25 DIAGNOSIS — M6281 Muscle weakness (generalized): Secondary | ICD-10-CM

## 2023-07-25 DIAGNOSIS — M5416 Radiculopathy, lumbar region: Secondary | ICD-10-CM | POA: Diagnosis not present

## 2023-07-25 NOTE — Therapy (Signed)
OUTPATIENT PHYSICAL THERAPY THORACOLUMBAR EVALUATION   Patient Name: Martha Koch MRN: [id number] DOB:03/14/1956, 68 y.o., female Today's Date: 07/25/2023  END OF SESSION:  PT End of Session - 07/25/23 0954     Visit Number 2    Date for PT Re-Evaluation 08/23/23    Authorization Type humana    PT Start Time [phone]    PT Stop Time 1030    PT Time Calculation (min) 43 min    Activity Tolerance Patient tolerated treatment well    Behavior During Therapy [facility] for tasks assessed/performed             Past Medical History:  Diagnosis Date   Cervical cancer (HCC) cervical   Hyperlipidemia    Hypertension    Radiation 06/02/14-07/13/14   pelvis 45 gray, vaginal cuff/parametrial boost to 50.4 gray   Radiation 04/06/15-05/13/15   anterior abdominal wall 50.4 gray   Past Surgical History:  Procedure Laterality Date   ABDOMINAL HYSTERECTOMY     COLONOSCOPY  2011   MUSCLE BIOPSY Right 09/14/2021   Procedure: MUSCLE BIOPSY RIGHT THIGH AND RIGHT LOWER LEG;  Surgeon: Almond Lint, MD;  Location: [facility] OR;  Service: General;  Laterality: Right;   robotic radical hysterectomy, bso, pelvic lymphadenectomy Bilateral 04/21/14   TUBAL LIGATION     Patient Active Problem List   Diagnosis Date Noted   Insomnia 05/09/2023   Tachycardia 05/09/2023   Cutaneous abscess of face 03/14/2023   Dermoid cyst 03/14/2023   Osteoporosis 01/10/2023   Former tobacco use 09/16/2022   Atrophy of muscle of right lower leg 08/23/2021   Dyslipidemia 04/18/2021   Prediabetes 04/18/2021   Body mass index (BMI) 19.9 or less, adult 04/18/2021   Pancreatic cyst 09/18/2016   COPD (chronic obstructive pulmonary disease) (HCC) 04/23/2016   Mild airflow obstruction on pulmonary function test 03/18/2016   Abnormal PFT 03/18/2016   Diffusion capacity of lung (dl), decreased 25/36/6440   Abnormal CT of the chest 02/16/2016   Pulmonary nodules 02/16/2016   Recurrent cervical cancer (HCC) 03/04/2015   Abdominal  wall mass of left upper quadrant 01/13/2015   Cervical cancer, FIGO stage IB1 (HCC) 11/20/2014   Dense breast tissue 11/20/2014   Cervical cancer (HCC) 04/12/2014   Ovarian mass 04/12/2014   Loss of weight 03/02/2014   Essential hypertension 03/02/2014   Nicotine dependence 03/02/2014    PCP: Hoy Register, MD   REFERRING PROVIDER: Hoy Register, MD   REFERRING DIAG: M54.16 (ICD-10-CM) - Lumbar radiculopathy   Rationale for Evaluation and Treatment: Rehabilitation  THERAPY DIAG:  Muscle weakness (generalized)  Other abnormalities of gait and mobility  ONSET DATE: 4 yrs march  SUBJECTIVE:  SUBJECTIVE STATEMENT: Pain up slightly, maybe because I was up early and had to get here.   Initial subjective: Started all of a sudden. Had 3 steroid injection in lumbar spine without relief.  Last one 2 weeks today.  They say it is coming from my back but I think it is in my legs.  My circulation in my legs is not good  PERTINENT HISTORY:  PVD and lumbar radiculopathy; COPD  PAIN:  Are you having pain? Yes: NPRS scale: current 3/10; worst 9/10; 0/10 Pain location: RLE Pain description: ache; heavy Aggravating factors: walking 500 ft Relieving factors: stopping and standing or sitting 2-10 mins  PRECAUTIONS: None  RED FLAGS: None   WEIGHT BEARING RESTRICTIONS: No  FALLS:  Has patient fallen in last 6 months? No  LIVING ENVIRONMENT: Lives with: lives alone Lives in: House/apartment Stairs:  1 outdoor Has following equipment at home: Single point cane  OCCUPATION: retired  PLOF: Independent  PATIENT GOALS: less pain, be able to stand longer  NEXT MD VISIT: 2 month  OBJECTIVE:  Note: Objective measures were completed at Evaluation unless otherwise noted.  DIAGNOSTIC FINDINGS:   FINDINGS: Normal alignment and preserved vertebral body heights. No acute compression fracture, wedge-shaped deformity or focal kyphosis. Minimal endplate bony spurring at L3-4. Relatively preserved disc spaces. No pars defects. Minor SI joint sclerosis. Aortoiliac atherosclerosis noted. Overall stable exam.  PATIENT SURVEYS:  FOTO Primary score 51% with goal of 61%  COGNITION: Overall cognitive status: Within functional limits for tasks assessed     SENSATION: Heaviness with claudication rle from hip to foot after walking 500 ft   MUSCLE LENGTH: Hamstrings: WFL   POSTURE:  slight internal rotation of hips   LUMBAR ROM:   WFL  LOWER EXTREMITY ROM:     wfl  LOWER EXTREMITY MMT:    MMT Right eval Left eval  Hip flexion 36.9 47.9  Hip extension    Hip abduction 18.6 29.5  Hip adduction    Hip internal rotation    Hip external rotation    Knee flexion    Knee extension 41.8 wfl  Ankle dorsiflexion    Ankle plantarflexion    Ankle inversion    Ankle eversion     (Blank rows = not tested)  LUMBAR SPECIAL TESTS:  Straight leg raise test: Negative and Slump test: Negative  FUNCTIONAL TESTS:  Timed up and go (TUG): 11.09 5 x STS: 19.56  GAIT: Distance walked: 500 Assistive device utilized: None Level of assistance: Complete Independence Comments: normal cadence leads to slowed cadence and left leaning  TREATMENT  Pt seen for aquatic therapy today.  Treatment took place in water 3.5-4.75 ft in depth at the [facility] pool. Temp of water was 91.  Pt entered/exited the pool via stairs using step to pattern with hand rail.  *Intro to setting *stair negotiation with vc *Standing 3.6 ue support wall: toe raises; heel raises; high knee marching x 10; hip abd x 5 *Walking forward ue support barbell: forward 3.6 -> 3.8 ft; then backward x 2 widths; then side stepping (more difficult) x 2 widths *seated on lift chair for short rest  - LAQ; hip add/abd;  flutter kicking; cycling x 2-3 minutes ea *L stretch (some difficulty gaining position) x 3; Old man stretch x 3. Increases LBP discomfort  - seated rest period *Hamstring and gastroc stretch seated *began decompression on noodle with some difficulty maintaining COB.   Pt requires the buoyancy and hydrostatic pressure of water for  support, and to offload joints by unweighting joint load by at least 50 % in navel deep water and by at least 75-80% in chest to neck deep water.  Viscosity of the water is needed for resistance of strengthening. Water current perturbations provides challenge to standing balance requiring increased core activation.                                                                                                                                PATIENT EDUCATION:  Education details: Discussed eval findings, rehab rationale, aquatic program progression/POC and pools in area. Patient is in agreement  Person educated: Patient Education method: Explanation Education comprehension: verbalized understanding  HOME EXERCISE PROGRAM: Aquatic TBA  ASSESSMENT:  CLINICAL IMPRESSION: Pt demonstrates safety and indep in setting with therapist instructing from deck.  She is directed through gentle movement patterns to assess ability to gain COB, movement toleration and  pain response.  She requires extra time to control and stabilize standing statically and dynamically engaging core muscles. No complaints of le pain throughout session although she is given multiple rest periods. Good initial response to 1st session with good toleration to exercises.     Initial impression Patient is a 68 y.o. f who was seen today for physical therapy evaluation and treatment for LBP with radiculopathy into rle.  After reviewing pts chart and assessing, it is likely that her rle discomfort is coming from PVD /Aortoiliac atherosclerosis as it is presenting as intermittent claudication, having rle  pain (ache and heaviness) with activity then relieving with rest. She reports (and is demonstrated today) limited ability to amb continuously for greater than ~ 400 ft without a standing or seated rest period before continuing. Rest period relieves pain completely. Pain increases with distance then completely relieves with rest. Objective testing indicates rle weakness compared to left (issue has been progressing over past 4 yrs R>L) short duration functional testing TUG wfl, 5 x STS (increased muscle exertion) slowed with pain.  She may be a good candidate fro aquatic intervention as the hydrostatic pressure may improve venous blood return allowing for increased circulation in LE.  With success she will be able to improve rle strength in setting.  OBJECTIVE IMPAIRMENTS: decreased activity tolerance, decreased endurance, decreased strength, and pain.   ACTIVITY LIMITATIONS: carrying, lifting, squatting, stairs, transfers, and locomotion level  PARTICIPATION LIMITATIONS: meal prep, cleaning, shopping, community activity, and yard work  PERSONAL FACTORS: 1-2 comorbidities: see PmHx  are also affecting patient's functional outcome.   REHAB POTENTIAL: Fair to good  CLINICAL DECISION MAKING: Evolving/moderate complexity  EVALUATION COMPLEXITY: High   GOALS: Goals reviewed with patient? Yes  SHORT TERM GOALS: Target date: 08/22/23  Pt to meet stated Foto Goal Baseline: Goal status: INITIAL  2.  Pt will amb continuously x 10 minutes submerged without increase in LE pain Baseline: unable to land based Goal status: INITIAL  3.  .Pt will improve strength in R hip  to within 5lbs of contralateral side to demonstrate improved overall physical function Baseline: see chart Goal status: INITIAL  4.  Pt will tolerate full aquatic sessions consistently without increase in pain and with improving function to demonstrate good toleration and effectiveness of intervention.  Baseline:  Goal status:  INITIAL  5.  Pt will complete 10 consecutive STS transfers from water bench onto water step without limitation to le pain Baseline:  Goal status: INITIAL   LONG TERM GOALS: will set a re-cert if approp  PLAN:  PT FREQUENCY: 1-2x/week  PT DURATION: 5 weeks  PLANNED INTERVENTIONS: 97164- PT Re-evaluation, 97110-Therapeutic exercises, 97530- Therapeutic activity, 97112- Neuromuscular re-education, 97535- Self Care, [address]- Manual therapy, [id number]- Gait training, [phone]- Orthotic Fit/training, [phone]- Aquatic Therapy, [phone]- Electrical stimulation (unattended), Patient/Family education, Balance training, Stair training, Taping, Dry Needling, Joint mobilization, Cryotherapy, and Moist heat.  PLAN FOR NEXT SESSION: initial trial of aquatics submerged for toleration   Rushie Chestnut) Durell Lofaso MPT 07/25/23 9:57 AM [facility] [address] [place], [place], [address] Phone: [phone]   Fax:  [phone]   Referring diagnosis? M54.16 (ICD-10-CM) - Lumbar radiculopathy  Treatment diagnosis? (if different than referring diagnosis) M54.16 (ICD-10-CM) - Lumbar radiculopathy  What was this (referring dx) caused by? []  Surgery []  Fall [x]  Ongoing issue [x]  Arthritis []  Other: ____________  Laterality: []  Rt []  Lt [x]  Both  Check all possible CPT codes:  *CHOOSE 10 OR LESS*    See Planned Interventions listed in the Plan section of the Evaluation.

## 2023-07-30 ENCOUNTER — Ambulatory Visit: Payer: Medicare HMO | Admitting: Vascular Surgery

## 2023-07-30 ENCOUNTER — Encounter (HOSPITAL_BASED_OUTPATIENT_CLINIC_OR_DEPARTMENT_OTHER): Payer: Self-pay | Admitting: Physical Therapy

## 2023-07-30 ENCOUNTER — Ambulatory Visit (HOSPITAL_BASED_OUTPATIENT_CLINIC_OR_DEPARTMENT_OTHER): Payer: Medicare HMO | Admitting: Physical Therapy

## 2023-07-30 DIAGNOSIS — M5416 Radiculopathy, lumbar region: Secondary | ICD-10-CM | POA: Diagnosis not present

## 2023-07-30 DIAGNOSIS — M6281 Muscle weakness (generalized): Secondary | ICD-10-CM | POA: Diagnosis not present

## 2023-07-30 DIAGNOSIS — R2689 Other abnormalities of gait and mobility: Secondary | ICD-10-CM | POA: Diagnosis not present

## 2023-07-30 NOTE — Therapy (Signed)
OUTPATIENT PHYSICAL THERAPY THORACOLUMBAR TREATMENT   Patient Name: Martha Koch MRN: [id number] DOB:[birth year], 68 y.o., female Today's Date: 07/30/2023  END OF SESSION:  PT End of Session - 07/30/23 1628     Visit Number 3    Date for PT Re-Evaluation 08/23/23    Authorization Type humana    PT Start Time 1617    PT Stop Time 1655    PT Time Calculation (min) 38 min    Behavior During Therapy [facility] for tasks assessed/performed             Past Medical History:  Diagnosis Date   Cervical cancer (HCC) cervical   Hyperlipidemia    Hypertension    Radiation 06/02/14-07/13/14   pelvis 45 gray, vaginal cuff/parametrial boost to 50.4 gray   Radiation 04/06/15-05/13/15   anterior abdominal wall 50.4 gray   Past Surgical History:  Procedure Laterality Date   ABDOMINAL HYSTERECTOMY     COLONOSCOPY  2011   MUSCLE BIOPSY Right 09/14/2021   Procedure: MUSCLE BIOPSY RIGHT THIGH AND RIGHT LOWER LEG;  Surgeon: Almond Lint, MD;  Location: [facility] OR;  Service: General;  Laterality: Right;   robotic radical hysterectomy, bso, pelvic lymphadenectomy Bilateral 04/21/14   TUBAL LIGATION     Patient Active Problem List   Diagnosis Date Noted   Insomnia 05/09/2023   Tachycardia 05/09/2023   Cutaneous abscess of face 03/14/2023   Dermoid cyst 03/14/2023   Osteoporosis 01/10/2023   Former tobacco use 09/16/2022   Atrophy of muscle of right lower leg 08/23/2021   Dyslipidemia 04/18/2021   Prediabetes 04/18/2021   Body mass index (BMI) 19.9 or less, adult 04/18/2021   Pancreatic cyst 09/18/2016   COPD (chronic obstructive pulmonary disease) (HCC) 04/23/2016   Mild airflow obstruction on pulmonary function test 03/18/2016   Abnormal PFT 03/18/2016   Diffusion capacity of lung (dl), decreased 08/65/7846   Abnormal CT of the chest 02/16/2016   Pulmonary nodules 02/16/2016   Recurrent cervical cancer (HCC) 03/04/2015   Abdominal wall mass of left upper quadrant 01/13/2015   Cervical  cancer, FIGO stage IB1 (HCC) 11/20/2014   Dense breast tissue 11/20/2014   Cervical cancer (HCC) 04/12/2014   Ovarian mass 04/12/2014   Loss of weight 03/02/2014   Essential hypertension 03/02/2014   Nicotine dependence 03/02/2014    PCP: Hoy Register, MD   REFERRING PROVIDER: Hoy Register, MD   REFERRING DIAG: M54.16 (ICD-10-CM) - Lumbar radiculopathy   Rationale for Evaluation and Treatment: Rehabilitation  THERAPY DIAG:  Muscle weakness (generalized)  Other abnormalities of gait and mobility  ONSET DATE: 4 yrs march  SUBJECTIVE:  SUBJECTIVE STATEMENT: Pt reports she did well after last session; no increase in pain in LEs.    POOL ACCESS:  Member of [facility].    Initial subjective: Started all of a sudden. Had 3 steroid injection in lumbar spine without relief.  Last one 2 weeks today.  They say it is coming from my back but I think it is in my legs.  My circulation in my legs is not good  PERTINENT HISTORY:  PVD and lumbar radiculopathy; COPD  PAIN:  Are you having pain? Yes: NPRS scale: current 3/10;  Pain location: RLE Pain description: ache; heavy; weak Aggravating factors: walking 500 ft Relieving factors: stopping and standing or sitting 2-10 mins  PRECAUTIONS: None  RED FLAGS: None   WEIGHT BEARING RESTRICTIONS: No  FALLS:  Has patient fallen in last 6 months? No  LIVING ENVIRONMENT: Lives with: lives alone Lives in: House/apartment Stairs:  1 outdoor Has following equipment at home: Single point cane  OCCUPATION: retired  PLOF: Independent  PATIENT GOALS: less pain, be able to stand longer  NEXT MD VISIT: 2 month  OBJECTIVE:  Note: Objective measures were completed at Evaluation unless otherwise noted.  DIAGNOSTIC FINDINGS:  FINDINGS: Normal  alignment and preserved vertebral body heights. No acute compression fracture, wedge-shaped deformity or focal kyphosis. Minimal endplate bony spurring at L3-4. Relatively preserved disc spaces. No pars defects. Minor SI joint sclerosis. Aortoiliac atherosclerosis noted. Overall stable exam.  PATIENT SURVEYS:  FOTO Primary score 51% with goal of 61%  COGNITION: Overall cognitive status: Within functional limits for tasks assessed     SENSATION: Heaviness with claudication rle from hip to foot after walking 500 ft   MUSCLE LENGTH: Hamstrings: WFL   POSTURE:  slight internal rotation of hips   LUMBAR ROM:   WFL  LOWER EXTREMITY ROM:     wfl  LOWER EXTREMITY MMT:    MMT Right eval Left eval  Hip flexion 36.9 47.9  Hip extension    Hip abduction 18.6 29.5  Hip adduction    Hip internal rotation    Hip external rotation    Knee flexion    Knee extension 41.8 wfl  Ankle dorsiflexion    Ankle plantarflexion    Ankle inversion    Ankle eversion     (Blank rows = not tested)  LUMBAR SPECIAL TESTS:  Straight leg raise test: Negative and Slump test: Negative  FUNCTIONAL TESTS:  Timed up and go (TUG): 11.09 5 x STS: 19.56  GAIT: Distance walked: 500 Assistive device utilized: None Level of assistance: Complete Independence Comments: normal cadence leads to slowed cadence and left leaning  TREATMENT  Pt seen for aquatic therapy today.  Treatment took place in water 3.5-4.75 ft in depth at the [facility] pool. Temp of water was 91.  Pt entered/exited the pool via stairs using step to pattern with hand rail.  * UE on barbell: walking forward/ backward x 3 laps in 4 ft;  relaxed squat at wall for rest break * side stepping with UE on barbell x 3 laps; relaxed squat * UE on wall:  hip abdct/ abdct x 10; heel / toe raises x 10; hip ext to toe touch x 10 * forward walking kick with UE On barbell 1 lap *seated on lift chair   - LAQ with DF; hip add/abd;  flutter kicking; cycling * return to walking forward without support 1 lap and side stepping with barbell   Pt requires the buoyancy and hydrostatic pressure  of water for support, and to offload joints by unweighting joint load by at least 50 % in navel deep water and by at least 75-80% in chest to neck deep water.  Viscosity of the water is needed for resistance of strengthening. Water current perturbations provides challenge to standing balance requiring increased core activation.   PATIENT EDUCATION:  Education details: aquatic program progression/modifications Person educated: Patient Education method: Explanation Education comprehension: verbalized understanding  HOME EXERCISE PROGRAM: Aquatic TBA  ASSESSMENT:  CLINICAL IMPRESSION: Pt tolerated session well, without complaints of LE pain throughout session.  Pt initially reported fatigue and weakness in LEs with forward gait; improved with short rest periods. Forward walking kick increased LE pain; reduced with rest in relaxed squat.  Goals are ongoing.     Initial impression Patient is a 68 y.o. f who was seen today for physical therapy evaluation and treatment for LBP with radiculopathy into rle.  After reviewing pts chart and assessing, it is likely that her rle discomfort is coming from PVD /Aortoiliac atherosclerosis as it is presenting as intermittent claudication, having rle pain (ache and heaviness) with activity then relieving with rest. She reports (and is demonstrated today) limited ability to amb continuously for greater than ~ 400 ft without a standing or seated rest period before continuing. Rest period relieves pain completely. Pain increases with distance then completely relieves with rest. Objective testing indicates rle weakness compared to left (issue has been progressing over past 4 yrs R>L) short duration functional testing TUG wfl, 5 x STS (increased muscle exertion) slowed with pain.  She may be a good candidate fro  aquatic intervention as the hydrostatic pressure may improve venous blood return allowing for increased circulation in LE.  With success she will be able to improve rle strength in setting.  OBJECTIVE IMPAIRMENTS: decreased activity tolerance, decreased endurance, decreased strength, and pain.   ACTIVITY LIMITATIONS: carrying, lifting, squatting, stairs, transfers, and locomotion level  PARTICIPATION LIMITATIONS: meal prep, cleaning, shopping, community activity, and yard work  PERSONAL FACTORS: 1-2 comorbidities: see PmHx  are also affecting patient's functional outcome.   REHAB POTENTIAL: Fair to good  CLINICAL DECISION MAKING: Evolving/moderate complexity  EVALUATION COMPLEXITY: High   GOALS: Goals reviewed with patient? Yes  SHORT TERM GOALS: Target date: 08/22/23  Pt to meet stated Foto Goal Baseline: Goal status: INITIAL  2.  Pt will amb continuously x 10 minutes submerged without increase in LE pain Baseline: unable to land based Goal status: INITIAL  3.  .Pt will improve strength in R hip to within 5lbs of contralateral side to demonstrate improved overall physical function Baseline: see chart Goal status: INITIAL  4.  Pt will tolerate full aquatic sessions consistently without increase in pain and with improving function to demonstrate good toleration and effectiveness of intervention.  Baseline:  Goal status: INITIAL  5.  Pt will complete 10 consecutive STS transfers from water bench onto water step without limitation to le pain Baseline:  Goal status: INITIAL   LONG TERM GOALS: will set a re-cert if approp  PLAN:  PT FREQUENCY: 1-2x/week  PT DURATION: 5 weeks  PLANNED INTERVENTIONS: 97164- PT Re-evaluation, 97110-Therapeutic exercises, 97530- Therapeutic activity, 97112- Neuromuscular re-education, 97535- Self Care, 54098- Manual therapy, [id number]- Gait training, [phone]- Orthotic Fit/training, [phone]- Aquatic Therapy, [phone]- Electrical stimulation (unattended),  Patient/Family education, Balance training, Stair training, Taping, Dry Needling, Joint mobilization, Cryotherapy, and Moist heat.  PLAN FOR NEXT SESSION: initial trial of aquatics submerged for toleration   Mayer Camel, PTA 07/30/23  5:00 PM [facility] [address] [place], [place], [address] Phone: [phone]   Fax:  [phone]    Referring diagnosis? M54.16 (ICD-10-CM) - Lumbar radiculopathy  Treatment diagnosis? (if different than referring diagnosis) M54.16 (ICD-10-CM) - Lumbar radiculopathy  What was this (referring dx) caused by? []  Surgery []  Fall [x]  Ongoing issue [x]  Arthritis []  Other: ____________  Laterality: []  Rt []  Lt [x]  Both  Check all possible CPT codes:  *CHOOSE 10 OR LESS*    See Planned Interventions listed in the Plan section of the Evaluation.

## 2023-07-31 ENCOUNTER — Ambulatory Visit (HOSPITAL_BASED_OUTPATIENT_CLINIC_OR_DEPARTMENT_OTHER): Payer: Medicare HMO | Admitting: Physical Therapy

## 2023-07-31 ENCOUNTER — Encounter (HOSPITAL_BASED_OUTPATIENT_CLINIC_OR_DEPARTMENT_OTHER): Payer: Self-pay | Admitting: Physical Therapy

## 2023-07-31 DIAGNOSIS — R2689 Other abnormalities of gait and mobility: Secondary | ICD-10-CM

## 2023-07-31 DIAGNOSIS — M6281 Muscle weakness (generalized): Secondary | ICD-10-CM

## 2023-07-31 DIAGNOSIS — M5416 Radiculopathy, lumbar region: Secondary | ICD-10-CM | POA: Diagnosis not present

## 2023-07-31 NOTE — Therapy (Signed)
OUTPATIENT PHYSICAL THERAPY THORACOLUMBAR TREATMENT   Patient Name: Martha Koch MRN: [id number] DOB:27-Jul-1955, 68 y.o., female Today's Date: 07/31/2023  END OF SESSION:  PT End of Session - 07/31/23 1234     Visit Number 4    Date for PT Re-Evaluation 08/23/23    Authorization Type humana    PT Start Time 1116    PT Stop Time 1200    PT Time Calculation (min) 44 min    Activity Tolerance Patient tolerated treatment well    Behavior During Therapy [facility] for tasks assessed/performed              Past Medical History:  Diagnosis Date   Cervical cancer (HCC) cervical   Hyperlipidemia    Hypertension    Radiation 06/02/14-07/13/14   pelvis 45 gray, vaginal cuff/parametrial boost to 50.4 gray   Radiation 04/06/15-05/13/15   anterior abdominal wall 50.4 gray   Past Surgical History:  Procedure Laterality Date   ABDOMINAL HYSTERECTOMY     COLONOSCOPY  2011   MUSCLE BIOPSY Right 09/14/2021   Procedure: MUSCLE BIOPSY RIGHT THIGH AND RIGHT LOWER LEG;  Surgeon: Almond Lint, MD;  Location: [facility] OR;  Service: General;  Laterality: Right;   robotic radical hysterectomy, bso, pelvic lymphadenectomy Bilateral 04/21/14   TUBAL LIGATION     Patient Active Problem List   Diagnosis Date Noted   Insomnia 05/09/2023   Tachycardia 05/09/2023   Cutaneous abscess of face 03/14/2023   Dermoid cyst 03/14/2023   Osteoporosis 01/10/2023   Former tobacco use 09/16/2022   Atrophy of muscle of right lower leg 08/23/2021   Dyslipidemia 04/18/2021   Prediabetes 04/18/2021   Body mass index (BMI) 19.9 or less, adult 04/18/2021   Pancreatic cyst 09/18/2016   COPD (chronic obstructive pulmonary disease) (HCC) 04/23/2016   Mild airflow obstruction on pulmonary function test 03/18/2016   Abnormal PFT 03/18/2016   Diffusion capacity of lung (dl), decreased 40/34/7425   Abnormal CT of the chest 02/16/2016   Pulmonary nodules 02/16/2016   Recurrent cervical cancer (HCC) 03/04/2015   Abdominal  wall mass of left upper quadrant 01/13/2015   Cervical cancer, FIGO stage IB1 (HCC) 11/20/2014   Dense breast tissue 11/20/2014   Cervical cancer (HCC) 04/12/2014   Ovarian mass 04/12/2014   Loss of weight 03/02/2014   Essential hypertension 03/02/2014   Nicotine dependence 03/02/2014    PCP: Hoy Register, MD   REFERRING PROVIDER: Hoy Register, MD   REFERRING DIAG: M54.16 (ICD-10-CM) - Lumbar radiculopathy   Rationale for Evaluation and Treatment: Rehabilitation  THERAPY DIAG:  Muscle weakness (generalized)  Other abnormalities of gait and mobility  ONSET DATE: 4 yrs march  SUBJECTIVE:  SUBJECTIVE STATEMENT: Pt reports increase in LE pain/heaviness with exiting to parking lot last session.  Did feel legs got heavy x 1 during last session  POOL ACCESS:  Member of [facility].    Initial subjective: Started all of a sudden. Had 3 steroid injection in lumbar spine without relief.  Last one 2 weeks today.  They say it is coming from my back but I think it is in my legs.  My circulation in my legs is not good  PERTINENT HISTORY:  PVD and lumbar radiculopathy; COPD  PAIN:  Are you having pain? Yes: NPRS scale: current 5/10 walking to setting; 0/10  Pain location: RLE Pain description: ache; heavy; weak Aggravating factors: walking 500 ft Relieving factors: stopping and standing or sitting 2-10 mins  PRECAUTIONS: None  RED FLAGS: None   WEIGHT BEARING RESTRICTIONS: No  FALLS:  Has patient fallen in last 6 months? No  LIVING ENVIRONMENT: Lives with: lives alone Lives in: House/apartment Stairs:  1 outdoor Has following equipment at home: Single point cane  OCCUPATION: retired  PLOF: Independent  PATIENT GOALS: less pain, be able to stand longer  NEXT MD VISIT: 2  month  OBJECTIVE:  Note: Objective measures were completed at Evaluation unless otherwise noted.  DIAGNOSTIC FINDINGS:  FINDINGS: Normal alignment and preserved vertebral body heights. No acute compression fracture, wedge-shaped deformity or focal kyphosis. Minimal endplate bony spurring at L3-4. Relatively preserved disc spaces. No pars defects. Minor SI joint sclerosis. Aortoiliac atherosclerosis noted. Overall stable exam.  PATIENT SURVEYS:  FOTO Primary score 51% with goal of 61%  COGNITION: Overall cognitive status: Within functional limits for tasks assessed     SENSATION: Heaviness with claudication rle from hip to foot after walking 500 ft   MUSCLE LENGTH: Hamstrings: WFL   POSTURE:  slight internal rotation of hips   LUMBAR ROM:   WFL  LOWER EXTREMITY ROM:     wfl  LOWER EXTREMITY MMT:    MMT Right eval Left eval  Hip flexion 36.9 47.9  Hip extension    Hip abduction 18.6 29.5  Hip adduction    Hip internal rotation    Hip external rotation    Knee flexion    Knee extension 41.8 wfl  Ankle dorsiflexion    Ankle plantarflexion    Ankle inversion    Ankle eversion     (Blank rows = not tested)  LUMBAR SPECIAL TESTS:  Straight leg raise test: Negative and Slump test: Negative  FUNCTIONAL TESTS:  Timed up and go (TUG): 11.09 5 x STS: 19.56  GAIT: Distance walked: 500 Assistive device utilized: None Level of assistance: Complete Independence Comments: normal cadence leads to slowed cadence and left leaning  TREATMENT  Pt seen for aquatic therapy today.  Treatment took place in water 3.5-4.75 ft in depth at the [facility] pool. Temp of water was 91.  Pt entered/exited the pool via stairs using step to pattern with hand rail.  * UE on barbell: walking forward 4 widths; backward x 4  in 4 ft;  relaxed squat at wall for rest break added noodle under arms * side stepping with UE on barbell x 8 laps; relaxed squat *seated on lift  chair   - LAQ with DF; hip add/abd; flutter kicking; cycling * UE on wall:  hip abdct/ abdct x 10; heel / toe raises x 10; hip ext to toe touch x 10; relaxed squats x 10  - 2 rest periods *Runners stretch for gastroc and hamstring  on 3rd step. * return to walking forward without support 1 lap and side stepping with barbell   Pt requires the buoyancy and hydrostatic pressure of water for support, and to offload joints by unweighting joint load by at least 50 % in navel deep water and by at least 75-80% in chest to neck deep water.  Viscosity of the water is needed for resistance of strengthening. Water current perturbations provides challenge to standing balance requiring increased core activation.   PATIENT EDUCATION:  Education details: aquatic program progression/modifications Person educated: Patient Education method: Explanation Education comprehension: verbalized understanding  HOME EXERCISE PROGRAM: Aquatic TBA  ASSESSMENT:  CLINICAL IMPRESSION: Forward amb causes claudication quicker than retro or side stepping (pt tolerates x 4 width of forward and/or back before needing rest, 8 widths + of side stepping without heaviness). Pt symptoms appear to affect posterior distribution of vessels only with excellent toleration of continuous movement where hamstrings and gastroc being used minimally.  Good toleration of session.  Does require multiple recovery periods. Pt encouraged to sit and rest on way out of setting if heaviness or pain becomes excessive.  She VU      Initial impression Patient is a 68 y.o. f who was seen today for physical therapy evaluation and treatment for LBP with radiculopathy into rle.  After reviewing pts chart and assessing, it is likely that her rle discomfort is coming from PVD /Aortoiliac atherosclerosis as it is presenting as intermittent claudication, having rle pain (ache and heaviness) with activity then relieving with rest. She reports (and is  demonstrated today) limited ability to amb continuously for greater than ~ 400 ft without a standing or seated rest period before continuing. Rest period relieves pain completely. Pain increases with distance then completely relieves with rest. Objective testing indicates rle weakness compared to left (issue has been progressing over past 4 yrs R>L) short duration functional testing TUG wfl, 5 x STS (increased muscle exertion) slowed with pain.  She may be a good candidate fro aquatic intervention as the hydrostatic pressure may improve venous blood return allowing for increased circulation in LE.  With success she will be able to improve rle strength in setting.  OBJECTIVE IMPAIRMENTS: decreased activity tolerance, decreased endurance, decreased strength, and pain.   ACTIVITY LIMITATIONS: carrying, lifting, squatting, stairs, transfers, and locomotion level  PARTICIPATION LIMITATIONS: meal prep, cleaning, shopping, community activity, and yard work  PERSONAL FACTORS: 1-2 comorbidities: see PmHx  are also affecting patient's functional outcome.   REHAB POTENTIAL: Fair to good  CLINICAL DECISION MAKING: Evolving/moderate complexity  EVALUATION COMPLEXITY: High   GOALS: Goals reviewed with patient? Yes  SHORT TERM GOALS: Target date: 08/22/23  Pt to meet stated Foto Goal Baseline: Goal status: INITIAL  2.  Pt will amb continuously x 10 minutes submerged without increase in LE pain Baseline: unable to land based Goal status: INITIAL  3.  .Pt will improve strength in R hip to within 5lbs of contralateral side to demonstrate improved overall physical function Baseline: see chart Goal status: INITIAL  4.  Pt will tolerate full aquatic sessions consistently without increase in pain and with improving function to demonstrate good toleration and effectiveness of intervention.  Baseline:  Goal status: INITIAL  5.  Pt will complete 10 consecutive STS transfers from water bench onto water  step without limitation to le pain Baseline:  Goal status: INITIAL   LONG TERM GOALS: will set a re-cert if approp  PLAN:  PT FREQUENCY: 1-2x/week  PT DURATION: 5  weeks  PLANNED INTERVENTIONS: 97164- PT Re-evaluation, 97110-Therapeutic exercises, 97530- Therapeutic activity, [id number]- Neuromuscular re-education, [phone]- Self Care, 62130- Manual therapy, [phone]- Gait training, [phone]- Orthotic Fit/training, [phone]- Aquatic Therapy, [phone]- Electrical stimulation (unattended), Patient/Family education, Balance training, Stair training, Taping, Dry Needling, Joint mobilization, Cryotherapy, and Moist heat.  PLAN FOR NEXT SESSION: initial trial of aquatics submerged for toleration   Rushie Chestnut) Christene Pounds MPT 07/31/23 12:35 PM [facility] [address] [place], [place], [address] Phone: [phone]   Fax:  [phone]     Referring diagnosis? M54.16 (ICD-10-CM) - Lumbar radiculopathy  Treatment diagnosis? (if different than referring diagnosis) M54.16 (ICD-10-CM) - Lumbar radiculopathy  What was this (referring dx) caused by? []  Surgery []  Fall [x]  Ongoing issue [x]  Arthritis []  Other: ____________  Laterality: []  Rt []  Lt [x]  Both  Check all possible CPT codes:  *CHOOSE 10 OR LESS*    See Planned Interventions listed in the Plan section of the Evaluation.

## 2023-08-06 ENCOUNTER — Encounter (HOSPITAL_BASED_OUTPATIENT_CLINIC_OR_DEPARTMENT_OTHER): Payer: Self-pay | Admitting: Physical Therapy

## 2023-08-06 ENCOUNTER — Ambulatory Visit (HOSPITAL_BASED_OUTPATIENT_CLINIC_OR_DEPARTMENT_OTHER): Payer: Medicare HMO | Attending: Family Medicine | Admitting: Physical Therapy

## 2023-08-06 DIAGNOSIS — M6281 Muscle weakness (generalized): Secondary | ICD-10-CM | POA: Insufficient documentation

## 2023-08-06 DIAGNOSIS — R2689 Other abnormalities of gait and mobility: Secondary | ICD-10-CM | POA: Insufficient documentation

## 2023-08-06 NOTE — Therapy (Signed)
 OUTPATIENT PHYSICAL THERAPY THORACOLUMBAR TREATMENT   Patient Name: Martha Koch MRN: [id number] DOB:1956/04/04, 68 y.o., female Today's Date: 08/06/2023  END OF SESSION:  PT End of Session - 08/06/23 0955     Visit Number 5    Date for PT Re-Evaluation 08/23/23    Authorization Type humana    PT Start Time [phone]    PT Stop Time 1006    PT Time Calculation (min) 42 min    Activity Tolerance Patient tolerated treatment well    Behavior During Therapy [facility] for tasks assessed/performed               Past Medical History:  Diagnosis Date   Cervical cancer (HCC) cervical   Hyperlipidemia    Hypertension    Radiation 06/02/14-07/13/14   pelvis 45 gray, vaginal cuff/parametrial boost to 50.4 gray   Radiation 04/06/15-05/13/15   anterior abdominal wall 50.4 gray   Past Surgical History:  Procedure Laterality Date   ABDOMINAL HYSTERECTOMY     COLONOSCOPY  2011   MUSCLE BIOPSY Right 09/14/2021   Procedure: MUSCLE BIOPSY RIGHT THIGH AND RIGHT LOWER LEG;  Surgeon: Aron Shoulders, MD;  Location: [facility] OR;  Service: General;  Laterality: Right;   robotic radical hysterectomy, bso, pelvic lymphadenectomy Bilateral 04/21/14   TUBAL LIGATION     Patient Active Problem List   Diagnosis Date Noted   Insomnia 05/09/2023   Tachycardia 05/09/2023   Cutaneous abscess of face 03/14/2023   Dermoid cyst 03/14/2023   Osteoporosis 01/10/2023   Former tobacco use 09/16/2022   Atrophy of muscle of right lower leg 08/23/2021   Dyslipidemia 04/18/2021   Prediabetes 04/18/2021   Body mass index (BMI) 19.9 or less, adult 04/18/2021   Pancreatic cyst 09/18/2016   COPD (chronic obstructive pulmonary disease) (HCC) 04/23/2016   Mild airflow obstruction on pulmonary function test 03/18/2016   Abnormal PFT 03/18/2016   Diffusion capacity of lung (dl), decreased 90/82/7982   Abnormal CT of the chest 02/16/2016   Pulmonary nodules 02/16/2016   Recurrent cervical cancer (HCC) 03/04/2015   Abdominal  wall mass of left upper quadrant 01/13/2015   Cervical cancer, FIGO stage IB1 (HCC) 11/20/2014   Dense breast tissue 11/20/2014   Cervical cancer (HCC) 04/12/2014   Ovarian mass 04/12/2014   Loss of weight 03/02/2014   Essential hypertension 03/02/2014   Nicotine  dependence 03/02/2014    PCP: Delbert Clam, MD   REFERRING PROVIDER: Delbert Clam, MD   REFERRING DIAG: M54.16 (ICD-10-CM) - Lumbar radiculopathy   Rationale for Evaluation and Treatment: Rehabilitation  THERAPY DIAG:  Muscle weakness (generalized)  Other abnormalities of gait and mobility  ONSET DATE: 4 yrs march  SUBJECTIVE:  SUBJECTIVE STATEMENT: Pt reports she did better walking to car (with less pain/heaviness in LEs) when resting for a while after exiting the pool.   POOL ACCESS:  Member of [facility].    Initial subjective: Started all of a sudden. Had 3 steroid injection in lumbar spine without relief.  Last one 2 weeks today.  They say it is coming from my back but I think it is in my legs.  My circulation in my legs is not good  PERTINENT HISTORY:  PVD and lumbar radiculopathy; COPD  PAIN:  Are you having pain? no: NPRS scale: current 0/10   Pain location: n/a Pain description: ache; heavy; weak Aggravating factors: walking 500 ft Relieving factors: stopping and standing or sitting 2-10 mins  PRECAUTIONS: None  RED FLAGS: None   WEIGHT BEARING RESTRICTIONS: No  FALLS:  Has patient fallen in last 6 months? No  LIVING ENVIRONMENT: Lives with: lives alone Lives in: House/apartment Stairs:  1 outdoor Has following equipment at home: Single point cane  OCCUPATION: retired  PLOF: Independent  PATIENT GOALS: less pain, be able to stand longer  NEXT MD VISIT: 2 month  OBJECTIVE:  Note: Objective  measures were completed at Evaluation unless otherwise noted.  DIAGNOSTIC FINDINGS:  FINDINGS: Normal alignment and preserved vertebral body heights. No acute compression fracture, wedge-shaped deformity or focal kyphosis. Minimal endplate bony spurring at L3-4. Relatively preserved disc spaces. No pars defects. Minor SI joint sclerosis. Aortoiliac atherosclerosis noted. Overall stable exam.  PATIENT SURVEYS:  FOTO Primary score 51% with goal of 61%  COGNITION: Overall cognitive status: Within functional limits for tasks assessed     SENSATION: Heaviness with claudication rle from hip to foot after walking 500 ft   MUSCLE LENGTH: Hamstrings: WFL   POSTURE:  slight internal rotation of hips   LUMBAR ROM:   WFL  LOWER EXTREMITY ROM:     wfl  LOWER EXTREMITY MMT:    MMT Right eval Left eval  Hip flexion 36.9 47.9  Hip extension    Hip abduction 18.6 29.5  Hip adduction    Hip internal rotation    Hip external rotation    Knee flexion    Knee extension 41.8 wfl  Ankle dorsiflexion    Ankle plantarflexion    Ankle inversion    Ankle eversion     (Blank rows = not tested)  LUMBAR SPECIAL TESTS:  Straight leg raise test: Negative and Slump test: Negative  FUNCTIONAL TESTS:  Timed up and go (TUG): 11.09 5 x STS: 19.56  GAIT: Distance walked: 500 Assistive device utilized: None Level of assistance: Complete Independence Comments: normal cadence leads to slowed cadence and left leaning  TREATMENT  Pt seen for aquatic therapy today.  Treatment took place in water 3.5-4.75 ft in depth at the [facility] pool. Temp of water was 91.  Pt entered/exited the pool via stairs using step to pattern with hand rail.  * unsupported:  walking forward/backward 3 laps, cues to not lift feet as high  * side stepping without support x 4 laps; relaxed squat * side stepping with arm addct/ abdct with rainbow hand floats x 2 laps *seated on bench (in water) with  blue step under feet:  - LAQ with DF; hip add/abd; STS x 5 * forward step ups at 3.75 ft x 5 each LE with LUE on wall x 15 total * return to walking forward/ backward without support x 2 laps * seated on noodle, UE on corner:  gentle cycling,  hip abdct/ addct *hamstring stretch with foot on 3rd step x 15s;  gastroc stretch x 15s each  Pt requires the buoyancy and hydrostatic pressure of water for support, and to offload joints by unweighting joint load by at least 50 % in navel deep water and by at least 75-80% in chest to neck deep water.  Viscosity of the water is needed for resistance of strengthening. Water current perturbations provides challenge to standing balance requiring increased core activation.   PATIENT EDUCATION:  Education details: aquatic program progression/modifications Person educated: Patient Education method: Explanation Education comprehension: verbalized understanding  HOME EXERCISE PROGRAM: Aquatic TBA  ASSESSMENT:  CLINICAL IMPRESSION: Pt tolerated progression of side stepping, with addition of UE resistance well, without increase in symptoms.  She continues to report fatigue when initially walking forward/backward if lifting feet too high (similar to hamstring curl).  Good tolerance for aquatic exercises today, without any report of heaviness in LEs mid-way through to end of session.   Goals are ongoing.    Initial impression Patient is a 68 y.o. f who was seen today for physical therapy evaluation and treatment for LBP with radiculopathy into rle.  After reviewing pts chart and assessing, it is likely that her rle discomfort is coming from PVD /Aortoiliac atherosclerosis as it is presenting as intermittent claudication, having rle pain (ache and heaviness) with activity then relieving with rest. She reports (and is demonstrated today) limited ability to amb continuously for greater than ~ 400 ft without a standing or seated rest period before continuing. Rest  period relieves pain completely. Pain increases with distance then completely relieves with rest. Objective testing indicates rle weakness compared to left (issue has been progressing over past 4 yrs R>L) short duration functional testing TUG wfl, 5 x STS (increased muscle exertion) slowed with pain.  She may be a good candidate fro aquatic intervention as the hydrostatic pressure may improve venous blood return allowing for increased circulation in LE.  With success she will be able to improve rle strength in setting.  OBJECTIVE IMPAIRMENTS: decreased activity tolerance, decreased endurance, decreased strength, and pain.   ACTIVITY LIMITATIONS: carrying, lifting, squatting, stairs, transfers, and locomotion level  PARTICIPATION LIMITATIONS: meal prep, cleaning, shopping, community activity, and yard work  PERSONAL FACTORS: 1-2 comorbidities: see PmHx  are also affecting patient's functional outcome.   REHAB POTENTIAL: Fair to good  CLINICAL DECISION MAKING: Evolving/moderate complexity  EVALUATION COMPLEXITY: High   GOALS: Goals reviewed with patient? Yes  SHORT TERM GOALS: Target date: 08/22/23  Pt to meet stated Foto Goal Baseline: Goal status: INITIAL  2.  Pt will amb continuously x 10 minutes submerged without increase in LE pain Baseline: unable to land based Goal status: INITIAL  3.  .Pt will improve strength in R hip to within 5lbs of contralateral side to demonstrate improved overall physical function Baseline: see chart Goal status: INITIAL  4.  Pt will tolerate full aquatic sessions consistently without increase in pain and with improving function to demonstrate good toleration and effectiveness of intervention.  Baseline:  Goal status: INITIAL  5.  Pt will complete 10 consecutive STS transfers from water bench onto water step without limitation to le pain Baseline:  Goal status: INITIAL   LONG TERM GOALS: will set a re-cert if approp  PLAN:  PT FREQUENCY:  1-2x/week  PT DURATION: 5 weeks  PLANNED INTERVENTIONS: 97164- PT Re-evaluation, 97110-Therapeutic exercises, 97530- Therapeutic activity, [id number]- Neuromuscular re-education, 97535- Self Care, 02859- Manual therapy, [id number]- Gait  training, 02239- Orthotic Fit/training, 02886- Aquatic Therapy, [phone]- Electrical stimulation (unattended), Patient/Family education, Balance training, Stair training, Taping, Dry Needling, Joint mobilization, Cryotherapy, and Moist heat.  PLAN FOR NEXT SESSION: continue aquatics   Brun[place], [place] 08/06/23 10:10 AM [facility] [address] [place], [place], [address] Phone: [phone]   Fax:  [phone]    Referring diagnosis? M54.16 (ICD-10-CM) - Lumbar radiculopathy  Treatment diagnosis? (if different than referring diagnosis) M54.16 (ICD-10-CM) - Lumbar radiculopathy  What was this (referring dx) caused by? []  Surgery []  Fall [x]  Ongoing issue [x]  Arthritis []  Other: ____________  Laterality: []  Rt []  Lt [x]  Both  Check all possible CPT codes:  *CHOOSE 10 OR LESS*    See Planned Interventions listed in the Plan section of the Evaluation.

## 2023-08-07 ENCOUNTER — Other Ambulatory Visit: Payer: Self-pay

## 2023-08-07 ENCOUNTER — Ambulatory Visit: Payer: Medicare HMO | Attending: Family Medicine | Admitting: Family Medicine

## 2023-08-07 ENCOUNTER — Encounter: Payer: Self-pay | Admitting: Family Medicine

## 2023-08-07 VITALS — BP 130/78 | HR 89 | Ht 66.0 in | Wt 160.0 lb

## 2023-08-07 DIAGNOSIS — R7303 Prediabetes: Secondary | ICD-10-CM

## 2023-08-07 DIAGNOSIS — M5416 Radiculopathy, lumbar region: Secondary | ICD-10-CM | POA: Diagnosis not present

## 2023-08-07 DIAGNOSIS — E039 Hypothyroidism, unspecified: Secondary | ICD-10-CM | POA: Diagnosis not present

## 2023-08-07 LAB — POCT GLYCOSYLATED HEMOGLOBIN (HGB A1C): HbA1c, POC (prediabetic range): 6 % (ref 5.7–6.4)

## 2023-08-07 MED ORDER — LEVOTHYROXINE SODIUM 50 MCG PO TABS
50.0000 ug | ORAL_TABLET | Freq: Every day | ORAL | 1 refills | Status: DC
  Filled 2023-08-07 – 2023-08-15 (×3): qty 90, 90d supply, fill #0

## 2023-08-07 NOTE — Patient Instructions (Signed)
 VISIT SUMMARY:  During today's visit, we discussed your hypertension, hypothyroidism, and chronic back pain. You mentioned feeling generally well and have found water therapy to be helpful for your back pain. We also reviewed your recent elevated blood pressure readings and your concerns about hypothyroidism medication causing heartburn.  YOUR PLAN:  -HYPERTENSION: Hypertension means high blood pressure. Your recent elevated readings may be related to coffee intake and physical activity. We will continue your current medication and recheck your blood pressure at the next visit.  -HYPOTHYROIDISM: Hypothyroidism is when your thyroid  gland does not produce enough thyroid  hormone. Your TSH levels have risen since you stopped taking your medication due to heartburn. We will resume Levothyroxine  and add Omeprazole  to manage the heartburn. We will check your TSH levels at the next visit.  -CHRONIC BACK PAIN: Chronic back pain is long-term pain in your back. You have not found relief from previous injections but have benefited from water therapy. Continue with the water therapy and we will consider additional referrals if you find it beneficial after completion.  -GENERAL HEALTH MAINTENANCE: You declined the shingles vaccine and flu shot due to previous adverse reactions. We will recheck your blood pressure at the next visit.  INSTRUCTIONS:  Please continue with your current antihypertensive medication and resume taking Levothyroxine  with Omeprazole  to manage heartburn. Keep attending water therapy sessions for your back pain. We will recheck your blood pressure and TSH levels at your next visit.

## 2023-08-07 NOTE — Progress Notes (Signed)
 Subjective:  Patient ID: Martha Koch, female    DOB: February 07, 1956  Age: 68 y.o. MRN: [id number]  CC: Medical Management of Chronic Issues   HPI Martha Koch is a 68 y.o. year old female with a history of  hypertension, prediabetes, Emphysema, previous history of cervical cancer (status post total abdominal hysterectomy and salpingo-oophorectomy, s/p chemo and radiation), Previous nicotine  dependence (quit in 08/2022), PVD, lumbar radiculopathy, hypothyroidism  Interval History: Discussed the use of AI scribe software for clinical note transcription with the patient, who gave verbal consent to proceed.  She reports feeling pretty good. She has recently started water therapy, which she attends twice a week and finds helpful. She reports a noticeable difference in her back symptoms since starting the therapy. Regarding her back pain, she has had three different injections from three different physicians, none of which have provided relief. She has decided not to pursue further injections and is instead focusing on water therapy.  Her blood pressure was slightly elevated at the time of the visit, which she attributes to having had a cup of coffee before the appointment.  She was referred to an ear, nose, and throat specialist for an issue with her throat, which has since been resolved with the use of prescribed Flonase .  Her hypothyroidism remains a concern. She was previously prescribed Levothyroxine  for the condition, but it caused heartburn. She stopped taking the medication and her TSH levels have since risen significantly.        Past Medical History:  Diagnosis Date   Cervical cancer (HCC) cervical   Hyperlipidemia    Hypertension    Radiation 06/02/14-07/13/14   pelvis 45 gray, vaginal cuff/parametrial boost to 50.4 gray   Radiation 04/06/15-05/13/15   anterior abdominal wall 50.4 gray    Past Surgical History:  Procedure Laterality Date   ABDOMINAL HYSTERECTOMY      COLONOSCOPY  2011   MUSCLE BIOPSY Right 09/14/2021   Procedure: MUSCLE BIOPSY RIGHT THIGH AND RIGHT LOWER LEG;  Surgeon: Aron Shoulders, MD;  Location: [facility] OR;  Service: General;  Laterality: Right;   robotic radical hysterectomy, bso, pelvic lymphadenectomy Bilateral 04/21/14   TUBAL LIGATION      Family History  Problem Relation Age of Onset   Hypertension Father    Breast cancer Maternal Aunt        breast cancer, x 4 aunts    Breast cancer Daughter    Cancer Other    Diabetes Other    Colon cancer Neg Hx    Colon polyps Neg Hx    Esophageal cancer Neg Hx    Stomach cancer Neg Hx    Rectal cancer Neg Hx    Neuropathy Neg Hx     Social History   Socioeconomic History   Marital status: Single    Spouse name: Not on file   Number of children: 3   Years of education: 12   Highest education level: Not on file  Occupational History   Occupation: [employer]     Employer: [employer]  Tobacco Use   Smoking status: Former    Current packs/day: 0.00    Average packs/day: 0.5 packs/day for 30.0 years (15.0 ttl pk-yrs)    Types: Cigarettes    Start date: 09/11/1992    Quit date: 09/12/2022    Years since quitting: 0.9   Smokeless tobacco: Never  Vaping Use   Vaping status: Never Used  Substance and Sexual Activity   Alcohol use: No  Alcohol/week: 0.0 standard drinks of alcohol   Drug use: No   Sexual activity: Not Currently  Other Topics Concern   Not on file  Social History Narrative   Has 3 children. They live in [place].   Has 3 grandchildren.    [name] (oldest daughter)    Lives alone.    Social Drivers of Corporate Investment Banker Strain: Low Risk  (10/03/2022)   Overall Financial Resource Strain (CARDIA)    Difficulty of Paying Living Expenses: Not hard at all  Food Insecurity: Low Risk  (05/08/2023)   Received from [facility]   Hunger Vital Sign    Worried About Running Out of Food in the Last Year: Never true    Ran Out of Food in the  Last Year: Never true  Transportation Needs: No Transportation Needs (05/08/2023)   Received from [facility]    In the past 12 months, has lack of reliable transportation kept you from medical appointments, meetings, work or from getting things needed for daily living? : No  Physical Activity: Inactive (10/03/2022)   Exercise Vital Sign    Days of Exercise per Week: 0 days    Minutes of Exercise per Session: 0 min  Stress: No Stress Concern Present (10/03/2022)   [employer] of Occupational Health - Occupational Stress Questionnaire    Feeling of Stress : Not at all  Social Connections: Not on file    Allergies  Allergen Reactions   Aspirin     Stomach cramps   Penicillins Hives    Did it involve swelling of the face/tongue/throat, SOB, or low BP? No Did it involve sudden or severe rash/hives, skin peeling, or any reaction on the inside of your mouth or nose? Yes Did you need to seek medical attention at a hospital or doctor's office? Yes When did it last happen?    Several Years Ago   If all above answers are NO, may proceed with cephalosporin use.      Outpatient Medications Prior to Visit  Medication Sig Dispense Refill   albuterol  (VENTOLIN  HFA) 108 (90 Base) MCG/ACT inhaler Inhale 2 puffs into the lungs every 6 (six) hours as needed for wheezing or shortness of breath. 18 g 2   amLODipine  (NORVASC ) 10 MG tablet Take 1 tablet (10 mg total) by mouth daily. 90 tablet 1   aspirin EC 81 MG tablet Take by mouth.     atorvastatin  (LIPITOR) 80 MG tablet Take 1 tablet (80 mg total) by mouth at bedtime. 90 tablet 3   cholecalciferol 25 MCG (1000 UT) tablet Take 1,000 Units by mouth daily.     fluticasone  (FLONASE ) 50 MCG/ACT nasal spray Place 2 sprays into both nostrils daily. 16 g 6   omeprazole  (PRILOSEC) 40 MG capsule Take 1 capsule (40 mg total) by mouth daily. 30 capsule 3   Tiotropium Bromide-Olodaterol (STIOLTO RESPIMAT ) 2.5-2.5 MCG/ACT AERS Inhale 2  puffs into the lungs daily. 4 g 6   levothyroxine  (SYNTHROID ) 50 MCG tablet Take 1 tablet (50 mcg total) by mouth daily. 90 tablet 1   albuterol  (PROVENTIL ) (2.5 MG/3ML) 0.083% nebulizer solution Take 3 mLs (2.5 mg total) by nebulization every 6 (six) hours as needed for wheezing or shortness of breath. (Patient not taking: Reported on 08/07/2023) 180 mL 1   cetirizine  (ZYRTEC ) 10 MG tablet Take 1 tablet (10 mg total) by mouth daily. (Patient not taking: Reported on 08/07/2023) 100 tablet 1   Facility-Administered Medications Prior to  Visit  Medication Dose Route Frequency Provider Last Rate Last Admin   acetaminophen  (TYLENOL ) tablet 650 mg  650 mg Oral Q6H PRN Brien Belvie BRAVO, MD   650 mg at 03/14/23 0922     ROS Review of Systems  Constitutional:  Negative for activity change and appetite change.  HENT:  Negative for sinus pressure and sore throat.   Respiratory:  Negative for chest tightness, shortness of breath and wheezing.   Cardiovascular:  Negative for chest pain and palpitations.  Gastrointestinal:  Negative for abdominal distention, abdominal pain and constipation.  Genitourinary: Negative.   Musculoskeletal: Negative.   Psychiatric/Behavioral:  Negative for behavioral problems and dysphoric mood.     Objective:  BP 130/78   Pulse 89   Ht 5' 6 (1.676 m)   Wt 160 lb (72.6 kg)   SpO2 98%   BMI 25.82 kg/m      08/07/2023    2:30 PM 08/07/2023    1:59 PM 06/28/2023    8:24 AM  BP/Weight  Systolic BP 130 148 166  Diastolic BP 78 83 79  Wt. (Lbs)  160   BMI  25.82 kg/m2       Physical Exam Constitutional:      Appearance: She is well-developed.  Cardiovascular:     Rate and Rhythm: Normal rate.     Heart sounds: Normal heart sounds. No murmur heard. Pulmonary:     Effort: Pulmonary effort is normal.     Breath sounds: Normal breath sounds. No wheezing or rales.  Chest:     Chest wall: No tenderness.  Abdominal:     General: Bowel sounds are normal. There is  no distension.     Palpations: Abdomen is soft. There is no mass.     Tenderness: There is no abdominal tenderness.  Musculoskeletal:        General: Normal range of motion.     Right lower leg: No edema.     Left lower leg: No edema.  Neurological:     Mental Status: She is alert and oriented to person, place, and time.  Psychiatric:        Mood and Affect: Mood normal.        Latest Ref Rng & Units 05/16/2023    2:08 PM 02/08/2023   11:05 AM 09/17/2022    4:06 AM  CMP  Glucose 70 - 99 mg/dL 85  875  850   BUN 8 - 27 mg/dL 6  9  12    Creatinine 0.57 - 1.00 mg/dL 9.07  9.12  9.15   Sodium 134 - 144 mmol/L 146  144  139   Potassium 3.5 - 5.2 mmol/L 4.0  4.3  3.9   Chloride 96 - 106 mmol/L 106  107  108   CO2 20 - 29 mmol/L 16  22  22    Calcium  8.7 - 10.3 mg/dL 89.8  9.5  8.6   Total Protein 6.0 - 8.5 g/dL 7.5  6.7    Total Bilirubin 0.0 - 1.2 mg/dL 0.2  0.3    Alkaline Phos 44 - 121 IU/L 114  93    AST 0 - 40 IU/L 24  37    ALT 0 - 32 IU/L 14  42      Lipid Panel     Component Value Date/Time   CHOL 146 02/08/2023 1105   TRIG 49 02/08/2023 1105   HDL 67 02/08/2023 1105   CHOLHDL 2.4 04/18/2021 1600   CHOLHDL 2.9 08/26/2015  0945   VLDL 12 08/26/2015 0945   LDLCALC 68 02/08/2023 1105    CBC    Component Value Date/Time   WBC 4.8 05/16/2023 1408   WBC 3.3 (L) 09/17/2022 0406   RBC 5.36 (H) 05/16/2023 1408   RBC 5.04 09/17/2022 0406   HGB 13.4 05/16/2023 1408   HGB 12.8 11/18/2014 0902   HCT 42.0 05/16/2023 1408   HCT 38.9 11/18/2014 0902   PLT 423 05/16/2023 1408   MCV 78 (L) 05/16/2023 1408   MCV 82.4 11/18/2014 0902   MCH 25.0 (L) 05/16/2023 1408   MCH 26.4 09/17/2022 0406   MCHC 31.9 05/16/2023 1408   MCHC 32.2 09/17/2022 0406   RDW 15.3 05/16/2023 1408   RDW 13.2 11/18/2014 0902   LYMPHSABS 2.0 05/16/2023 1408   LYMPHSABS 0.9 11/18/2014 0902   MONOABS 0.2 09/17/2022 0406   MONOABS 0.3 11/18/2014 0902   EOSABS 0.1 05/16/2023 1408   BASOSABS 0.0  05/16/2023 1408   BASOSABS 0.0 11/18/2014 0902    Lab Results  Component Value Date   HGBA1C 6.0 08/07/2023    Lab Results  Component Value Date   TSH 17.000 (H) 06/17/2023    Assessment & Plan:      Hypertension Elevated blood pressure readings at recent visits, possibly related to coffee intake and physical activity. No changes in medication reported. -Continue current antihypertensive regimen. -Recheck blood pressure at the end of the visit revealed a normal blood pressure  Hypothyroidism Elevated TSH in December  Patient reported not taking prescribed medication due to heartburn. -Resume Levothyroxine  with Omeprazole  to manage heartburn. -Check TSH at next visit.  Chronic Back Pain No relief from previous injections. Currently undergoing water therapy which seems to be beneficial. -Continue water therapy. -Consider additional referral if patient finds therapy beneficial after completion.  General Health Maintenance -Declined shingles vaccine and flu shot due to previous adverse reaction. -Recheck blood pressure at next visit.          Meds ordered this encounter  Medications   levothyroxine  (SYNTHROID ) 50 MCG tablet    Sig: Take 1 tablet (50 mcg total) by mouth daily.    Dispense:  90 tablet    Refill:  1    Follow-up: Return for previously scheduled appointment.       Corrina Sabin, MD, FAAFP. [facility] [place], [place] [phone]   08/07/2023, 2:58 PM

## 2023-08-08 ENCOUNTER — Encounter (HOSPITAL_BASED_OUTPATIENT_CLINIC_OR_DEPARTMENT_OTHER): Payer: Self-pay | Admitting: Physical Therapy

## 2023-08-08 ENCOUNTER — Other Ambulatory Visit: Payer: Self-pay

## 2023-08-08 ENCOUNTER — Ambulatory Visit (HOSPITAL_BASED_OUTPATIENT_CLINIC_OR_DEPARTMENT_OTHER): Payer: Medicare HMO | Admitting: Physical Therapy

## 2023-08-08 DIAGNOSIS — M6281 Muscle weakness (generalized): Secondary | ICD-10-CM

## 2023-08-08 DIAGNOSIS — R2689 Other abnormalities of gait and mobility: Secondary | ICD-10-CM

## 2023-08-08 NOTE — Therapy (Signed)
 OUTPATIENT PHYSICAL THERAPY THORACOLUMBAR TREATMENT   Patient Name: Martha Koch MRN: [id number] DOB:1956-01-26, 68 y.o., female Today's Date: 08/08/2023  END OF SESSION:  PT End of Session - 08/08/23 1448     Visit Number 6    Date for PT Re-Evaluation 08/23/23    Authorization Type humana    PT Start Time 1450    PT Stop Time 1529    PT Time Calculation (min) 39 min    Activity Tolerance Patient tolerated treatment well    Behavior During Therapy [facility] for tasks assessed/performed               Past Medical History:  Diagnosis Date   Cervical cancer (HCC) cervical   Hyperlipidemia    Hypertension    Radiation 06/02/14-07/13/14   pelvis 45 gray, vaginal cuff/parametrial boost to 50.4 gray   Radiation 04/06/15-05/13/15   anterior abdominal wall 50.4 gray   Past Surgical History:  Procedure Laterality Date   ABDOMINAL HYSTERECTOMY     COLONOSCOPY  2011   MUSCLE BIOPSY Right 09/14/2021   Procedure: MUSCLE BIOPSY RIGHT THIGH AND RIGHT LOWER LEG;  Surgeon: Aron Shoulders, MD;  Location: [facility] OR;  Service: General;  Laterality: Right;   robotic radical hysterectomy, bso, pelvic lymphadenectomy Bilateral 04/21/14   TUBAL LIGATION     Patient Active Problem List   Diagnosis Date Noted   Insomnia 05/09/2023   Tachycardia 05/09/2023   Cutaneous abscess of face 03/14/2023   Dermoid cyst 03/14/2023   Osteoporosis 01/10/2023   Former tobacco use 09/16/2022   Atrophy of muscle of right lower leg 08/23/2021   Dyslipidemia 04/18/2021   Prediabetes 04/18/2021   Body mass index (BMI) 19.9 or less, adult 04/18/2021   Pancreatic cyst 09/18/2016   COPD (chronic obstructive pulmonary disease) (HCC) 04/23/2016   Mild airflow obstruction on pulmonary function test 03/18/2016   Abnormal PFT 03/18/2016   Diffusion capacity of lung (dl), decreased 90/82/7982   Abnormal CT of the chest 02/16/2016   Pulmonary nodules 02/16/2016   Recurrent cervical cancer (HCC) 03/04/2015   Abdominal  wall mass of left upper quadrant 01/13/2015   Cervical cancer, FIGO stage IB1 (HCC) 11/20/2014   Dense breast tissue 11/20/2014   Cervical cancer (HCC) 04/12/2014   Ovarian mass 04/12/2014   Loss of weight 03/02/2014   Essential hypertension 03/02/2014   Nicotine  dependence 03/02/2014    PCP: Delbert Clam, MD   REFERRING PROVIDER: Delbert Clam, MD   REFERRING DIAG: M54.16 (ICD-10-CM) - Lumbar radiculopathy   Rationale for Evaluation and Treatment: Rehabilitation  THERAPY DIAG:  Muscle weakness (generalized)  Other abnormalities of gait and mobility  ONSET DATE: 4 yrs march  SUBJECTIVE:  SUBJECTIVE STATEMENT: Pt reports was able to walk down the bleachers and to the car after basketball game at the [facility] with less pain.    POOL ACCESS:  Member of [facility].    Initial subjective: Started all of a sudden. Had 3 steroid injection in lumbar spine without relief.  Last one 2 weeks today.  They say it is coming from my back but I think it is in my legs.  My circulation in my legs is not good  PERTINENT HISTORY:  PVD and lumbar radiculopathy; COPD  PAIN:  Are you having pain? no: NPRS scale: current 0/10   Pain location: n/a Pain description: ache; heavy; weak Aggravating factors: walking 500 ft Relieving factors: stopping and standing or sitting 2-10 mins  PRECAUTIONS: None  RED FLAGS: None   WEIGHT BEARING RESTRICTIONS: No  FALLS:  Has patient fallen in last 6 months? No  LIVING ENVIRONMENT: Lives with: lives alone Lives in: House/apartment Stairs:  1 outdoor Has following equipment at home: Single point cane  OCCUPATION: retired  PLOF: Independent  PATIENT GOALS: less pain, be able to stand longer  NEXT MD VISIT: 2 month  OBJECTIVE:  Note: Objective measures  were completed at Evaluation unless otherwise noted.  DIAGNOSTIC FINDINGS:  FINDINGS: Normal alignment and preserved vertebral body heights. No acute compression fracture, wedge-shaped deformity or focal kyphosis. Minimal endplate bony spurring at L3-4. Relatively preserved disc spaces. No pars defects. Minor SI joint sclerosis. Aortoiliac atherosclerosis noted. Overall stable exam.  PATIENT SURVEYS:  FOTO Primary score 51% with goal of 61%  COGNITION: Overall cognitive status: Within functional limits for tasks assessed     SENSATION: Heaviness with claudication rle from hip to foot after walking 500 ft   MUSCLE LENGTH: Hamstrings: WFL   POSTURE:  slight internal rotation of hips   LUMBAR ROM:   WFL  LOWER EXTREMITY ROM:     wfl  LOWER EXTREMITY MMT:    MMT Right eval Left eval  Hip flexion 36.9 47.9  Hip extension    Hip abduction 18.6 29.5  Hip adduction    Hip internal rotation    Hip external rotation    Knee flexion    Knee extension 41.8 wfl  Ankle dorsiflexion    Ankle plantarflexion    Ankle inversion    Ankle eversion     (Blank rows = not tested)  LUMBAR SPECIAL TESTS:  Straight leg raise test: Negative and Slump test: Negative  FUNCTIONAL TESTS:  Timed up and go (TUG): 11.09 5 x STS: 19.56  GAIT: Distance walked: 500 Assistive device utilized: None Level of assistance: Complete Independence Comments: normal cadence leads to slowed cadence and left leaning  TREATMENT  Pt seen for aquatic therapy today.  Treatment took place in water 3.5-4.75 ft in depth at the [facility] pool. Temp of water was 91.  Pt entered/exited the pool via stairs using step to pattern with hand rail.  * side stepping without support x 4 laps with arm addct/ abdct without floats * unsupported:  walking forward/backward 3 laps, cues to not lift feet as high  * UE on rainbow hand floats - braiding L/R with cues for sequence - 3 laps  *seated on bench  (in water) with blue step under feet:  - LAQ with DF x 10 each LE; hip add/abd; STS x 10;   * forward step ups at 3 ft 6 x 10 each LE with LUE on wall  * return to walking forward/ backward  without support x 5 laps    Pt requires the buoyancy and hydrostatic pressure of water for support, and to offload joints by unweighting joint load by at least 50 % in navel deep water and by at least 75-80% in chest to neck deep water.  Viscosity of the water is needed for resistance of strengthening. Water current perturbations provides challenge to standing balance requiring increased core activation.   PATIENT EDUCATION:  Education details: aquatic program progression/modifications Person educated: Patient Education method: Explanation Education comprehension: verbalized understanding  HOME EXERCISE PROGRAM: Aquatic TBA  ASSESSMENT:  CLINICAL IMPRESSION: Pt able to tolerate walking continuously (side stepping/ forward/backward)  for 11 min without increase in symptoms.   Good tolerance for aquatic exercises today, without any report of heaviness throughout session. Able to STS in water 10x.   Pt has met STG 2,4,and 5. Pt to pick up schedule of pool.  Will plan to begin creating HEP for water, and discussed having land appt to review equipment on land she can use if pool is closed.    Initial impression Patient is a 68 y.o. f who was seen today for physical therapy evaluation and treatment for LBP with radiculopathy into rle.  After reviewing pts chart and assessing, it is likely that her rle discomfort is coming from PVD /Aortoiliac atherosclerosis as it is presenting as intermittent claudication, having rle pain (ache and heaviness) with activity then relieving with rest. She reports (and is demonstrated today) limited ability to amb continuously for greater than ~ 400 ft without a standing or seated rest period before continuing. Rest period relieves pain completely. Pain increases with distance  then completely relieves with rest. Objective testing indicates rle weakness compared to left (issue has been progressing over past 4 yrs R>L) short duration functional testing TUG wfl, 5 x STS (increased muscle exertion) slowed with pain.  She may be a good candidate fro aquatic intervention as the hydrostatic pressure may improve venous blood return allowing for increased circulation in LE.  With success she will be able to improve rle strength in setting.  OBJECTIVE IMPAIRMENTS: decreased activity tolerance, decreased endurance, decreased strength, and pain.   ACTIVITY LIMITATIONS: carrying, lifting, squatting, stairs, transfers, and locomotion level  PARTICIPATION LIMITATIONS: meal prep, cleaning, shopping, community activity, and yard work  PERSONAL FACTORS: 1-2 comorbidities: see PmHx  are also affecting patient's functional outcome.   REHAB POTENTIAL: Fair to good  CLINICAL DECISION MAKING: Evolving/moderate complexity  EVALUATION COMPLEXITY: High   GOALS: Goals reviewed with patient? Yes  SHORT TERM GOALS: Target date: 08/22/23  Pt to meet stated Foto Goal Baseline: Goal status: INITIAL  2.  Pt will amb continuously x 10 minutes submerged without increase in LE pain Baseline: unable to land based at eval;  Goal status: MET - 08/08/23  3.  .Pt will improve strength in R hip to within 5lbs of contralateral side to demonstrate improved overall physical function Baseline: see chart Goal status: INITIAL  4.  Pt will tolerate full aquatic sessions consistently without increase in pain and with improving function to demonstrate good toleration and effectiveness of intervention.  Baseline:  Goal status: MET - 08/08/23  5.  Pt will complete 10 consecutive STS transfers from water bench onto water step without limitation to le pain Baseline:  Goal status: MET - 08/08/23   LONG TERM GOALS: will set a re-cert if approp  PLAN:  PT FREQUENCY: 1-2x/week  PT DURATION: 5  weeks  PLANNED INTERVENTIONS: 97164- PT Re-evaluation, 97110-Therapeutic exercises,  02469- Therapeutic activity, [id number]- Neuromuscular re-education, [id number]- Self Care, [id number]- Manual therapy, [id number]- Gait training, [phone]- Orthotic Fit/training, [phone]- Aquatic Therapy, [phone]- Electrical stimulation (unattended), Patient/Family education, Balance training, Stair training, Taping, Dry Needling, Joint mobilization, Cryotherapy, and Moist heat.  PLAN FOR NEXT SESSION: continue aquatics   Delon Aquas, [place] 08/08/23 3:32 PM [facility] [address] [place], [place], [address] Phone: [phone]   Fax:  [phone]     Referring diagnosis? M54.16 (ICD-10-CM) - Lumbar radiculopathy  Treatment diagnosis? (if different than referring diagnosis) M54.16 (ICD-10-CM) - Lumbar radiculopathy  What was this (referring dx) caused by? []  Surgery []  Fall [x]  Ongoing issue [x]  Arthritis []  Other: ____________  Laterality: []  Rt []  Lt [x]  Both  Check all possible CPT codes:  *CHOOSE 10 OR LESS*    See Planned Interventions listed in the Plan section of the Evaluation.

## 2023-08-13 ENCOUNTER — Ambulatory Visit (HOSPITAL_BASED_OUTPATIENT_CLINIC_OR_DEPARTMENT_OTHER): Payer: Medicare HMO | Admitting: Physical Therapy

## 2023-08-13 ENCOUNTER — Encounter (HOSPITAL_BASED_OUTPATIENT_CLINIC_OR_DEPARTMENT_OTHER): Payer: Self-pay | Admitting: Physical Therapy

## 2023-08-13 DIAGNOSIS — R2689 Other abnormalities of gait and mobility: Secondary | ICD-10-CM

## 2023-08-13 DIAGNOSIS — M6281 Muscle weakness (generalized): Secondary | ICD-10-CM

## 2023-08-13 NOTE — Therapy (Signed)
OUTPATIENT PHYSICAL THERAPY THORACOLUMBAR TREATMENT   Patient Name: Martha Koch MRN: [id number] DOB:October 02, 1955, 68 y.o., female Today's Date: 08/13/2023  END OF SESSION:  PT End of Session - 08/13/23 1153     Visit Number 7    Date for PT Re-Evaluation 08/23/23    Authorization Type humana    PT Start Time 1155    PT Stop Time 1233    PT Time Calculation (min) 38 min    Activity Tolerance Patient tolerated treatment well    Behavior During Therapy [facility] for tasks assessed/performed               Past Medical History:  Diagnosis Date   Cervical cancer (HCC) cervical   Hyperlipidemia    Hypertension    Radiation 06/02/14-07/13/14   pelvis 45 gray, vaginal cuff/parametrial boost to 50.4 gray   Radiation 04/06/15-05/13/15   anterior abdominal wall 50.4 gray   Past Surgical History:  Procedure Laterality Date   ABDOMINAL HYSTERECTOMY     COLONOSCOPY  2011   MUSCLE BIOPSY Right 09/14/2021   Procedure: MUSCLE BIOPSY RIGHT THIGH AND RIGHT LOWER LEG;  Surgeon: Almond Lint, MD;  Location: [facility] OR;  Service: General;  Laterality: Right;   robotic radical hysterectomy, bso, pelvic lymphadenectomy Bilateral 04/21/14   TUBAL LIGATION     Patient Active Problem List   Diagnosis Date Noted   Insomnia 05/09/2023   Tachycardia 05/09/2023   Cutaneous abscess of face 03/14/2023   Dermoid cyst 03/14/2023   Osteoporosis 01/10/2023   Former tobacco use 09/16/2022   Atrophy of muscle of right lower leg 08/23/2021   Dyslipidemia 04/18/2021   Prediabetes 04/18/2021   Body mass index (BMI) 19.9 or less, adult 04/18/2021   Pancreatic cyst 09/18/2016   COPD (chronic obstructive pulmonary disease) (HCC) 04/23/2016   Mild airflow obstruction on pulmonary function test 03/18/2016   Abnormal PFT 03/18/2016   Diffusion capacity of lung (dl), decreased 40/98/1191   Abnormal CT of the chest 02/16/2016   Pulmonary nodules 02/16/2016   Recurrent cervical cancer (HCC) 03/04/2015    Abdominal wall mass of left upper quadrant 01/13/2015   Cervical cancer, FIGO stage IB1 (HCC) 11/20/2014   Dense breast tissue 11/20/2014   Cervical cancer (HCC) 04/12/2014   Ovarian mass 04/12/2014   Loss of weight 03/02/2014   Essential hypertension 03/02/2014   Nicotine dependence 03/02/2014    PCP: Hoy Register, MD   REFERRING PROVIDER: Hoy Register, MD   REFERRING DIAG: M54.16 (ICD-10-CM) - Lumbar radiculopathy   Rationale for Evaluation and Treatment: Rehabilitation  THERAPY DIAG:  Muscle weakness (generalized)  Other abnormalities of gait and mobility  ONSET DATE: 4 yrs march  SUBJECTIVE:  SUBJECTIVE STATEMENT: Pt reports  lbp only recently after bring in grocerys from store.  "I think I am walking further before the heaviness/discomfort increases in legs"  POOL ACCESS:  Member of [facility].    Initial subjective: Started all of a sudden. Had 3 steroid injection in lumbar spine without relief.  Last one 2 weeks today.  They say it is coming from my back but I think it is in my legs.  My circulation in my legs is not good  PERTINENT HISTORY:  PVD and lumbar radiculopathy; COPD  PAIN:  Are you having pain? no: NPRS scale: current 0/10   Pain location: n/a Pain description: ache; heavy; weak Aggravating factors: walking 500 ft Relieving factors: stopping and standing or sitting 2-10 mins  PRECAUTIONS: None  RED FLAGS: None   WEIGHT BEARING RESTRICTIONS: No  FALLS:  Has patient fallen in last 6 months? No  LIVING ENVIRONMENT: Lives with: lives alone Lives in: House/apartment Stairs:  1 outdoor Has following equipment at home: Single point cane  OCCUPATION: retired  PLOF: Independent  PATIENT GOALS: less pain, be able to stand longer  NEXT MD VISIT: 2  month  OBJECTIVE:  Note: Objective measures were completed at Evaluation unless otherwise noted.  DIAGNOSTIC FINDINGS:  FINDINGS: Normal alignment and preserved vertebral body heights. No acute compression fracture, wedge-shaped deformity or focal kyphosis. Minimal endplate bony spurring at L3-4. Relatively preserved disc spaces. No pars defects. Minor SI joint sclerosis. Aortoiliac atherosclerosis noted. Overall stable exam.  PATIENT SURVEYS:  FOTO Primary score 51% with goal of 61%  COGNITION: Overall cognitive status: Within functional limits for tasks assessed     SENSATION: Heaviness with claudication rle from hip to foot after walking 500 ft   MUSCLE LENGTH: Hamstrings: WFL   POSTURE:  slight internal rotation of hips   LUMBAR ROM:   WFL  LOWER EXTREMITY ROM:     wfl  LOWER EXTREMITY MMT:    MMT Right eval Left eval  Hip flexion 36.9 47.9  Hip extension    Hip abduction 18.6 29.5  Hip adduction    Hip internal rotation    Hip external rotation    Knee flexion    Knee extension 41.8 wfl  Ankle dorsiflexion    Ankle plantarflexion    Ankle inversion    Ankle eversion     (Blank rows = not tested)  LUMBAR SPECIAL TESTS:  Straight leg raise test: Negative and Slump test: Negative  FUNCTIONAL TESTS:  Timed up and go (TUG): 11.09 5 x STS: 19.56  GAIT: Distance walked: 500 Assistive device utilized: None Level of assistance: Complete Independence Comments: normal cadence leads to slowed cadence and left leaning  TREATMENT   Self care:Pt instruction on land vs aquatic intervention. Use of [employer] pools for indep exercises as well as water aerobic classes. Use of a recumbent bike vs elliptical (will tolerate reclined/sup/prone positions better than vertical due to PVD.  Pt seen for aquatic therapy today.  Treatment took place in water 3.5-4.75 ft in depth at the [facility] pool. Temp of water was 91.  Pt entered/exited the pool via stairs  using step to pattern with hand rail.  * side stepping without support x 4 laps with arm addct/ abdct without floats * unsupported:  walking forward/backward 3 laps, cues to not lift feet as high  * UE on rainbow hand floats - braiding L/R with cues for sequence - 3 laps  * forward step ups leading right at 3  ft 6" x 7 (did not tolerate)  - seated rest period on lift *seated on lift: LAQ; cycling; hip add/abd; hip flex/ext; df/pf * return to walking forward/ backward without support  *Standing ue support yellow noodle 4.0 ft: df; pf; hip flex/ext; hip add/abd; relaxed squat    Pt requires the buoyancy and hydrostatic pressure of water for support, and to offload joints by unweighting joint load by at least 50 % in navel deep water and by at least 75-80% in chest to neck deep water.  Viscosity of the water is needed for resistance of strengthening. Water current perturbations provides challenge to standing balance requiring increased core activation.   PATIENT EDUCATION:  Education details: aquatic program progression/modifications Person educated: Patient Education method: Explanation Education comprehension: verbalized understanding  HOME EXERCISE PROGRAM: Access Code: [email or web address] URL: [email or web address] Date: 08/13/2023 Prepared by: Geni Bers  Exercises - Hand Buoy Carry  - 1 x daily - 7 x weekly - 3 sets - 10 reps - Braided Sidestepping  - 1 x daily - 7 x weekly - 3 sets - 10 reps - Side Stepping  - 1 x daily - 7 x weekly - 3 sets - 10 reps - Standing Hip Flexion Extension at [employer]  - 1 x daily - 7 x weekly - 3 sets - 10 reps  ASSESSMENT:  CLINICAL IMPRESSION: Some increase in le discomfort today. Requires seated rest period.  Does not tolerate step ups in 3.6 ft as it increased le weakness.  Seated le engagement does not increase le weakness or heaviness.  Instruction on pending DC and future aquatic Hep/[facility] aerobic classes at [facility]. Have decided to  complete remaining appts in aquatics. HEP initiated    Initial impression Patient is a 68 y.o. f who was seen today for physical therapy evaluation and treatment for LBP with radiculopathy into rle.  After reviewing pts chart and assessing, it is likely that her rle discomfort is coming from PVD /Aortoiliac atherosclerosis as it is presenting as intermittent claudication, having rle pain (ache and heaviness) with activity then relieving with rest. She reports (and is demonstrated today) limited ability to amb continuously for greater than ~ 400 ft without a standing or seated rest period before continuing. Rest period relieves pain completely. Pain increases with distance then completely relieves with rest. Objective testing indicates rle weakness compared to left (issue has been progressing over past 4 yrs R>L) short duration functional testing TUG wfl, 5 x STS (increased muscle exertion) slowed with pain.  She may be a good candidate fro aquatic intervention as the hydrostatic pressure may improve venous blood return allowing for increased circulation in LE.  With success she will be able to improve rle strength in setting.  OBJECTIVE IMPAIRMENTS: decreased activity tolerance, decreased endurance, decreased strength, and pain.   ACTIVITY LIMITATIONS: carrying, lifting, squatting, stairs, transfers, and locomotion level  PARTICIPATION LIMITATIONS: meal prep, cleaning, shopping, community activity, and yard work  PERSONAL FACTORS: 1-2 comorbidities: see PmHx  are also affecting patient's functional outcome.   REHAB POTENTIAL: Fair to good  CLINICAL DECISION MAKING: Evolving/moderate complexity  EVALUATION COMPLEXITY: High   GOALS: Goals reviewed with patient? Yes  SHORT TERM GOALS: Target date: 08/22/23  Pt to meet stated Foto Goal Baseline: Goal status: INITIAL  2.  Pt will amb continuously x 10 minutes submerged without increase in LE pain Baseline: unable to land based at eval;   Goal status: MET - 08/08/23  3.  .Pt will improve strength in R  hip to within 5lbs of contralateral side to demonstrate improved overall physical function Baseline: see chart Goal status: INITIAL  4.  Pt will tolerate full aquatic sessions consistently without increase in pain and with improving function to demonstrate good toleration and effectiveness of intervention.  Baseline:  Goal status: MET - 08/08/23  5.  Pt will complete 10 consecutive STS transfers from water bench onto water step without limitation to le pain Baseline:  Goal status: MET - 08/08/23   LONG TERM GOALS: will set a re-cert if approp  PLAN:  PT FREQUENCY: 1-2x/week  PT DURATION: 5 weeks  PLANNED INTERVENTIONS: 97164- PT Re-evaluation, 97110-Therapeutic exercises, 97530- Therapeutic activity, 97112- Neuromuscular re-education, 97535- Self Care, 40981- Manual therapy, [id number]- Gait training, [phone]- Orthotic Fit/training, [phone]- Aquatic Therapy, [phone]- Electrical stimulation (unattended), Patient/Family education, Balance training, Stair training, Taping, Dry Needling, Joint mobilization, Cryotherapy, and Moist heat.  PLAN FOR NEXT SESSION: continue aquatics   Corrie Dandy [place]) Izabellah Dadisman MPT 08/13/23 12:07 PM [facility] [address] [place], [place], [address] Phone: [phone]   Fax:  [phone]      Referring diagnosis? M54.16 (ICD-10-CM) - Lumbar radiculopathy  Treatment diagnosis? (if different than referring diagnosis) M54.16 (ICD-10-CM) - Lumbar radiculopathy  What was this (referring dx) caused by? []  Surgery []  Fall [x]  Ongoing issue [x]  Arthritis []  Other: ____________  Laterality: []  Rt []  Lt [x]  Both  Check all possible CPT codes:  *CHOOSE 10 OR LESS*    See Planned Interventions listed in the Plan section of the Evaluation.

## 2023-08-14 ENCOUNTER — Other Ambulatory Visit: Payer: Self-pay | Admitting: Family Medicine

## 2023-08-14 DIAGNOSIS — E039 Hypothyroidism, unspecified: Secondary | ICD-10-CM

## 2023-08-14 NOTE — Telephone Encounter (Unsigned)
Copied from CRM [id number]. Topic: Clinical - Medication Refill >> Aug 14, 2023  4:12 PM [name] wrote: Most Recent Primary Care Visit:  Provider: Hoy Register  Department: [facility]  Visit Type: OFFICE VISIT  Date: 08/07/2023  Medication: levothyroxine (SYNTHROID) 50 MCG tablet  Has the patient contacted their pharmacy? Yes (Agent: If no, request that the patient contact the pharmacy for the refill. If patient does not wish to contact the pharmacy document the reason why and proceed with request.) (Agent: If yes, when and what did the pharmacy advise?) Pharmacy advised it is too early to refill the prescription. Patient was taken off medication and discarded the rest. Dr. Alvis Lemmings needs to send authorization for pharmacy to fill prescription early.  Is this the correct pharmacy for this prescription? Yes If no, delete pharmacy and type the correct one.  This is the patient's preferred pharmacy:   [facility] [address], [address] [place] [address] Phone: [phone] Fax: [phone]  Has the prescription been filled recently? Yes  Is the patient out of the medication? Yes  Has the patient been seen for an appointment in the last year OR does the patient have an upcoming appointment? Yes  Can we respond through MyChart? Yes  Agent: Please be advised that Rx refills may take up to 3 business days. We ask that you follow-up with your pharmacy.

## 2023-08-14 NOTE — Telephone Encounter (Signed)
Last Fill: 08/07/23  Last OV: 08/07/23 Next OV: 09/10/23 AWV  Routing to provider for review/authorization.

## 2023-08-15 ENCOUNTER — Ambulatory Visit (HOSPITAL_BASED_OUTPATIENT_CLINIC_OR_DEPARTMENT_OTHER): Payer: Medicare HMO | Admitting: Physical Therapy

## 2023-08-15 ENCOUNTER — Other Ambulatory Visit: Payer: Self-pay

## 2023-08-15 DIAGNOSIS — R2689 Other abnormalities of gait and mobility: Secondary | ICD-10-CM | POA: Diagnosis not present

## 2023-08-15 DIAGNOSIS — M6281 Muscle weakness (generalized): Secondary | ICD-10-CM

## 2023-08-15 NOTE — Therapy (Signed)
OUTPATIENT PHYSICAL THERAPY THORACOLUMBAR TREATMENT   Patient Name: Martha Koch MRN: [id number] DOB:[birth year], 68 y.o., female Today's Date: 08/15/2023  END OF SESSION:  PT End of Session - 08/15/23 1527     Visit Number 8    Date for PT Re-Evaluation 08/23/23    Authorization Type humana    Authorization - Visit Number 8    Authorization - Number of Visits 8    Progress Note Due on Visit 10    PT Start Time 1448    PT Stop Time 1526    PT Time Calculation (min) 38 min    Activity Tolerance Patient tolerated treatment well    Behavior During Therapy [facility] for tasks assessed/performed                Past Medical History:  Diagnosis Date   Cervical cancer (HCC) cervical   Hyperlipidemia    Hypertension    Radiation 06/02/14-07/13/14   pelvis 45 gray, vaginal cuff/parametrial boost to 50.4 gray   Radiation 04/06/15-05/13/15   anterior abdominal wall 50.4 gray   Past Surgical History:  Procedure Laterality Date   ABDOMINAL HYSTERECTOMY     COLONOSCOPY  2011   MUSCLE BIOPSY Right 09/14/2021   Procedure: MUSCLE BIOPSY RIGHT THIGH AND RIGHT LOWER LEG;  Surgeon: Almond Lint, MD;  Location: [facility] OR;  Service: General;  Laterality: Right;   robotic radical hysterectomy, bso, pelvic lymphadenectomy Bilateral 04/21/14   TUBAL LIGATION     Patient Active Problem List   Diagnosis Date Noted   Insomnia 05/09/2023   Tachycardia 05/09/2023   Cutaneous abscess of face 03/14/2023   Dermoid cyst 03/14/2023   Osteoporosis 01/10/2023   Former tobacco use 09/16/2022   Atrophy of muscle of right lower leg 08/23/2021   Dyslipidemia 04/18/2021   Prediabetes 04/18/2021   Body mass index (BMI) 19.9 or less, adult 04/18/2021   Pancreatic cyst 09/18/2016   COPD (chronic obstructive pulmonary disease) (HCC) 04/23/2016   Mild airflow obstruction on pulmonary function test 03/18/2016   Abnormal PFT 03/18/2016   Diffusion capacity of lung (dl), decreased 78/29/5621   Abnormal CT of  the chest 02/16/2016   Pulmonary nodules 02/16/2016   Recurrent cervical cancer (HCC) 03/04/2015   Abdominal wall mass of left upper quadrant 01/13/2015   Cervical cancer, FIGO stage IB1 (HCC) 11/20/2014   Dense breast tissue 11/20/2014   Cervical cancer (HCC) 04/12/2014   Ovarian mass 04/12/2014   Loss of weight 03/02/2014   Essential hypertension 03/02/2014   Nicotine dependence 03/02/2014    PCP: Hoy Register, MD   REFERRING PROVIDER: Hoy Register, MD   REFERRING DIAG: M54.16 (ICD-10-CM) - Lumbar radiculopathy   Rationale for Evaluation and Treatment: Rehabilitation  THERAPY DIAG:  Muscle weakness (generalized)  Other abnormalities of gait and mobility  ONSET DATE: 4 yrs march  SUBJECTIVE:  SUBJECTIVE STATEMENT: Pt reports she has had a busy day so far.  Plans to begin going to [employer] pool at d/c.    POOL ACCESS:  Member of [facility].    Initial subjective: Started all of a sudden. Had 3 steroid injection in lumbar spine without relief.  Last one 2 weeks today.  They say it is coming from my back but I think it is in my legs.  My circulation in my legs is not good  PERTINENT HISTORY:  PVD and lumbar radiculopathy; COPD  PAIN:  Are you having pain? yes: NPRS scale: current 4/10   Pain location: LEs Pain description: ache; heavy Aggravating factors: walking 500 ft Relieving factors: stopping and standing or sitting 2-10 mins  PRECAUTIONS: None  RED FLAGS: None   WEIGHT BEARING RESTRICTIONS: No  FALLS:  Has patient fallen in last 6 months? No  LIVING ENVIRONMENT: Lives with: lives alone Lives in: House/apartment Stairs:  1 outdoor Has following equipment at home: Single point cane  OCCUPATION: retired  PLOF: Independent  PATIENT GOALS: less pain, be able to  stand longer  NEXT MD VISIT: 2 month  OBJECTIVE:  Note: Objective measures were completed at Evaluation unless otherwise noted.  DIAGNOSTIC FINDINGS:  FINDINGS: Normal alignment and preserved vertebral body heights. No acute compression fracture, wedge-shaped deformity or focal kyphosis. Minimal endplate bony spurring at L3-4. Relatively preserved disc spaces. No pars defects. Minor SI joint sclerosis. Aortoiliac atherosclerosis noted. Overall stable exam.  PATIENT SURVEYS:  FOTO Primary score 51% with goal of 61%  COGNITION: Overall cognitive status: Within functional limits for tasks assessed     SENSATION: Heaviness with claudication rle from hip to foot after walking 500 ft   MUSCLE LENGTH: Hamstrings: WFL   POSTURE:  slight internal rotation of hips   LUMBAR ROM:   WFL  LOWER EXTREMITY ROM:     wfl  LOWER EXTREMITY MMT:    MMT Right eval Left eval  Hip flexion 36.9 47.9  Hip extension    Hip abduction 18.6 29.5  Hip adduction    Hip internal rotation    Hip external rotation    Knee flexion    Knee extension 41.8 wfl  Ankle dorsiflexion    Ankle plantarflexion    Ankle inversion    Ankle eversion     (Blank rows = not tested)  LUMBAR SPECIAL TESTS:  Straight leg raise test: Negative and Slump test: Negative  FUNCTIONAL TESTS:  Timed up and go (TUG): 11.09 5 x STS: 19.56  GAIT: Distance walked: 500 Assistive device utilized: None Level of assistance: Complete Independence Comments: normal cadence leads to slowed cadence and left leaning  TREATMENT  Pt seen for aquatic therapy today.  Treatment took place in water 3.5-4.75 ft in depth at the [facility] pool. Temp of water was 91.  Pt entered/exited the pool via stairs independently with hand rail.  * unsupported:  walking forward/backward 3 laps, side stepping without support x 2 laps * UE on yellow  - braiding L/R with cues for sequence - 2 laps  * UE on wall:  hip abdct/  addct 3 x 5; leg swings into hip flex / ext x 10; heel / toe raises * return to walking forward/ backward without support  * farmer carry with bilat and single rainbow hand float under water at side, walking forward/ backward  * UE on wall:  relaxed squats x 10  * return to walking with reciprocal row motion with  rainbow hand floats  * walking forward kicks (small)    PATIENT EDUCATION:  Education details: aquatic program progression/modifications Person educated: Patient Education method: Explanation Education comprehension: verbalized understanding  HOME EXERCISE PROGRAM: Access Code: [email or web address] URL: [email or web address] Date: 08/13/2023 Prepared by: Geni Bers  Exercises - Hand Buoy Carry  - 1 x daily - 7 x weekly - 3 sets - 10 reps - Braided Sidestepping  - 1 x daily - 7 x weekly - 3 sets - 10 reps - Side Stepping  - 1 x daily - 7 x weekly - 3 sets - 10 reps - Standing Hip Flexion Extension at [employer]  - 1 x daily - 7 x weekly - 3 sets - 10 reps  ASSESSMENT:  CLINICAL IMPRESSION: Pt reported some increase in RLE discomfort with farmer carry, rainbow hand float on R side.  Eased with returning to regular forward/ backward walking without resistance.  All other exercises tolerated well.  Pt reported improvement in LE pain after completing exercise in the water. Will plan to show pt some aerobic equipment in gym (NuStep, bicycle, etc) prior to entry in water next visit.  Will finalize aquatic HEP and issue.  PT to test hip strength and FOTO in last visit.     Initial impression Patient is a 68 y.o. f who was seen today for physical therapy evaluation and treatment for LBP with radiculopathy into rle.  After reviewing pts chart and assessing, it is likely that her rle discomfort is coming from PVD /Aortoiliac atherosclerosis as it is presenting as intermittent claudication, having rle pain (ache and heaviness) with activity then relieving with rest. She reports  (and is demonstrated today) limited ability to amb continuously for greater than ~ 400 ft without a standing or seated rest period before continuing. Rest period relieves pain completely. Pain increases with distance then completely relieves with rest. Objective testing indicates rle weakness compared to left (issue has been progressing over past 4 yrs R>L) short duration functional testing TUG wfl, 5 x STS (increased muscle exertion) slowed with pain.  She may be a good candidate fro aquatic intervention as the hydrostatic pressure may improve venous blood return allowing for increased circulation in LE.  With success she will be able to improve rle strength in setting.  OBJECTIVE IMPAIRMENTS: decreased activity tolerance, decreased endurance, decreased strength, and pain.   ACTIVITY LIMITATIONS: carrying, lifting, squatting, stairs, transfers, and locomotion level  PARTICIPATION LIMITATIONS: meal prep, cleaning, shopping, community activity, and yard work  PERSONAL FACTORS: 1-2 comorbidities: see PmHx  are also affecting patient's functional outcome.   REHAB POTENTIAL: Fair to good  CLINICAL DECISION MAKING: Evolving/moderate complexity  EVALUATION COMPLEXITY: High   GOALS: Goals reviewed with patient? Yes  SHORT TERM GOALS: Target date: 08/22/23  Pt to meet stated Foto Goal Baseline: Goal status: INITIAL  2.  Pt will amb continuously x 10 minutes submerged without increase in LE pain Baseline: unable to land based at eval;  Goal status: MET - 08/08/23  3.  .Pt will improve strength in R hip to within 5lbs of contralateral side to demonstrate improved overall physical function Baseline: see chart Goal status: INITIAL  4.  Pt will tolerate full aquatic sessions consistently without increase in pain and with improving function to demonstrate good toleration and effectiveness of intervention.  Baseline:  Goal status: MET - 08/08/23  5.  Pt will complete 10 consecutive STS transfers  from water bench onto water step without limitation to le pain Baseline:  Goal status: MET - 08/08/23   LONG TERM GOALS: will set a re-cert if approp  PLAN:  PT FREQUENCY: 1-2x/week  PT DURATION: 5 weeks  PLANNED INTERVENTIONS: 97164- PT Re-evaluation, 97110-Therapeutic exercises, 97530- Therapeutic activity, 97112- Neuromuscular re-education, 97535- Self Care, 29562- Manual therapy, [phone]- Gait training, [phone]- Orthotic Fit/training, [phone]- Aquatic Therapy, [phone]- Electrical stimulation (unattended), Patient/Family education, Balance training, Stair training, Taping, Dry Needling, Joint mobilization, Cryotherapy, and Moist heat.  PLAN FOR NEXT SESSION: continue aquatics   Mayer Camel, [place] 08/15/23 4:47 PM [facility] [address] [place], [place], [address] Phone: [phone]   Fax:  [phone]       Referring diagnosis? M54.16 (ICD-10-CM) - Lumbar radiculopathy  Treatment diagnosis? (if different than referring diagnosis) M54.16 (ICD-10-CM) - Lumbar radiculopathy  What was this (referring dx) caused by? []  Surgery []  Fall [x]  Ongoing issue [x]  Arthritis []  Other: ____________  Laterality: []  Rt []  Lt [x]  Both  Check all possible CPT codes:  *CHOOSE 10 OR LESS*    See Planned Interventions listed in the Plan section of the Evaluation.

## 2023-08-20 ENCOUNTER — Encounter (HOSPITAL_BASED_OUTPATIENT_CLINIC_OR_DEPARTMENT_OTHER): Payer: Self-pay | Admitting: Physical Therapy

## 2023-08-20 ENCOUNTER — Ambulatory Visit (HOSPITAL_BASED_OUTPATIENT_CLINIC_OR_DEPARTMENT_OTHER): Payer: Medicare HMO | Admitting: Physical Therapy

## 2023-08-20 DIAGNOSIS — M6281 Muscle weakness (generalized): Secondary | ICD-10-CM

## 2023-08-20 DIAGNOSIS — R2689 Other abnormalities of gait and mobility: Secondary | ICD-10-CM

## 2023-08-20 NOTE — Therapy (Signed)
OUTPATIENT PHYSICAL THERAPY THORACOLUMBAR TREATMENT PHYSICAL THERAPY DISCHARGE SUMMARY  Visits from Start of Care: 9  Current functional level related to goals / functional outcomes: indep   Remaining deficits: PVD le   Education / Equipment: Management of condition; HEP   Patient agrees to discharge. Patient goals were met. Patient is being discharged due to meeting the stated rehab goals.   Patient Name: Martha Koch MRN: [id number] DOB:09/10/1955, 68 y.o., female Today's Date: 08/20/2023  END OF SESSION:  PT End of Session - 08/20/23 1805     Visit Number 9    Date for PT Re-Evaluation 08/23/23    Authorization Type humana    Authorization - Number of Visits 8    Progress Note Due on Visit 10    PT Start Time 1448    PT Stop Time 1530    PT Time Calculation (min) 42 min    Activity Tolerance Patient tolerated treatment well    Behavior During Therapy [facility] for tasks assessed/performed                 Past Medical History:  Diagnosis Date   Cervical cancer (HCC) cervical   Hyperlipidemia    Hypertension    Radiation 06/02/14-07/13/14   pelvis 45 gray, vaginal cuff/parametrial boost to 50.4 gray   Radiation 04/06/15-05/13/15   anterior abdominal wall 50.4 gray   Past Surgical History:  Procedure Laterality Date   ABDOMINAL HYSTERECTOMY     COLONOSCOPY  2011   MUSCLE BIOPSY Right 09/14/2021   Procedure: MUSCLE BIOPSY RIGHT THIGH AND RIGHT LOWER LEG;  Surgeon: Almond Lint, MD;  Location: [facility] OR;  Service: General;  Laterality: Right;   robotic radical hysterectomy, bso, pelvic lymphadenectomy Bilateral 04/21/14   TUBAL LIGATION     Patient Active Problem List   Diagnosis Date Noted   Insomnia 05/09/2023   Tachycardia 05/09/2023   Cutaneous abscess of face 03/14/2023   Dermoid cyst 03/14/2023   Osteoporosis 01/10/2023   Former tobacco use 09/16/2022   Atrophy of muscle of right lower leg 08/23/2021   Dyslipidemia 04/18/2021   Prediabetes  04/18/2021   Body mass index (BMI) 19.9 or less, adult 04/18/2021   Pancreatic cyst 09/18/2016   COPD (chronic obstructive pulmonary disease) (HCC) 04/23/2016   Mild airflow obstruction on pulmonary function test 03/18/2016   Abnormal PFT 03/18/2016   Diffusion capacity of lung (dl), decreased 40/98/1191   Abnormal CT of the chest 02/16/2016   Pulmonary nodules 02/16/2016   Recurrent cervical cancer (HCC) 03/04/2015   Abdominal wall mass of left upper quadrant 01/13/2015   Cervical cancer, FIGO stage IB1 (HCC) 11/20/2014   Dense breast tissue 11/20/2014   Cervical cancer (HCC) 04/12/2014   Ovarian mass 04/12/2014   Loss of weight 03/02/2014   Essential hypertension 03/02/2014   Nicotine dependence 03/02/2014    PCP: Hoy Register, MD   REFERRING PROVIDER: Hoy Register, MD   REFERRING DIAG: M54.16 (ICD-10-CM) - Lumbar radiculopathy   Rationale for Evaluation and Treatment: Rehabilitation  THERAPY DIAG:  Muscle weakness (generalized)  Other abnormalities of gait and mobility  ONSET DATE: 4 yrs march  SUBJECTIVE:  SUBJECTIVE STATEMENT: Went to [employer]  POOL ACCESS:  Member of [employer].    Initial subjective: Started all of a sudden. Had 3 steroid injection in lumbar spine without relief.  Last one 2 weeks today.  They say it is coming from my back but I think it is in my legs.  My circulation in my legs is not good  PERTINENT HISTORY:  PVD and lumbar radiculopathy; COPD  PAIN:  Are you having pain? yes: NPRS scale: current 4/10   Pain location: LEs Pain description: ache; heavy Aggravating factors: walking 500 ft Relieving factors: stopping and standing or sitting 2-10 mins  PRECAUTIONS: None  RED FLAGS: None   WEIGHT BEARING RESTRICTIONS: No  FALLS:  Has patient fallen  in last 6 months? No  LIVING ENVIRONMENT: Lives with: lives alone Lives in: House/apartment Stairs:  1 outdoor Has following equipment at home: Single point cane  OCCUPATION: retired  PLOF: Independent  PATIENT GOALS: less pain, be able to stand longer  NEXT MD VISIT: 2 month  OBJECTIVE:  Note: Objective measures were completed at Evaluation unless otherwise noted.  DIAGNOSTIC FINDINGS:  FINDINGS: Normal alignment and preserved vertebral body heights. No acute compression fracture, wedge-shaped deformity or focal kyphosis. Minimal endplate bony spurring at L3-4. Relatively preserved disc spaces. No pars defects. Minor SI joint sclerosis. Aortoiliac atherosclerosis noted. Overall stable exam.  PATIENT SURVEYS:  FOTO Primary score 51% with goal of 61% 08/20/23: 75%  COGNITION: Overall cognitive status: Within functional limits for tasks assessed     SENSATION: Heaviness with claudication rle from hip to foot after walking 500 ft   MUSCLE LENGTH: Hamstrings: WFL   POSTURE:  slight internal rotation of hips   LUMBAR ROM:   WFL  LOWER EXTREMITY ROM:     wfl  LOWER EXTREMITY MMT:    MMT Right eval Left eval Right 08/30/23  Hip flexion 36.9 47.9 48.2  Hip extension     Hip abduction 18.6 29.5 32.0  Hip adduction     Hip internal rotation     Hip external rotation     Knee flexion     Knee extension 41.8 wfl   Ankle dorsiflexion     Ankle plantarflexion     Ankle inversion     Ankle eversion      (Blank rows = not tested)  LUMBAR SPECIAL TESTS:  Straight leg raise test: Negative and Slump test: Negative  FUNCTIONAL TESTS:  Timed up and go (TUG): 11.09 5 x STS: 19.56  GAIT: Distance walked: 500 Assistive device utilized: None Level of assistance: Complete Independence Comments: normal cadence leads to slowed cadence and left leaning  TREATMENT   Nu-step Recumbent bike  Testing: HD muscle testing  Berg BERG Balance Test           Sit to  Stand 4  Standing unsupported 4  Sitting with back unsupported but feet supported 4  Stand to sit  4  Transfers  4  Standing unsupported with eyes closed 4  Standing unsupported feet together 4  From standing position, reach forward with outstretched arm 4  From standing position, pick up object from floor 4  From standing position, turn and look behind over each shoulder 4  Turn 360 4  Standing unsupported, alternately place foot on step 3  Standing unsupported, one foot in front 3  Standing on one leg 50/56  Total:     Instruction on final aquatic HEP.  PT demonstrates (on land as pt prefers  not to get submerged) all exercises, has pt demonstrate to ensure understanding   PATIENT EDUCATION:  Education details: aquatic program progression/modifications Person educated: Patient Education method: Explanation Education comprehension: verbalized understanding  HOME EXERCISE PROGRAM: Access Code: [email or web address] URL: [email or web address] Date: 08/13/2023 Prepared by: Geni Bers Updated 2/18 This aquatic home exercise program from Med[employer] utilizes pictures from land based exercises, but has been adapted prior to lamination and issuance.    Exercises - walking forward/ backward   - 3 x weekly - Side Stepping  - 3 sets - 10 reps - Hand Buoy Carry  - 3 x weekly - 3 sets - 10 reps - Braided Sidestepping  - 3 x weekly - 3 sets - 10 reps - Standing Hip Abduction  - 3 x weekly - 1-2 sets - 10 reps - Forward Backward Leg Swing - hold wall or noodle   - 3 x weekly - 1-2 sets - 10 reps - Heel Toe raises holding wall in pool   - 3 x weekly - 1-2 sets - 10 reps - Squat holding wall in pool  - 3 x weekly - 1-2 sets - 10 reps  ASSESSMENT:  CLINICAL IMPRESSION: Pt instructed on Nu-step and recumbent bike with good toleration.  She requires short rest period every 2-3 minutes to allow heaviness in LE to subside.  Upond completion pt report legs felt good, like they had been  used/worked.  She is instructed on final aquatic HEP.  She demonstrates and verbalizes indep with.  She has a Research scientist (physical sciences) at [employer] and has visited.  She is ready to begin her exercise programs indep.  All goals met.  She is ready for DC    OBJECTIVE IMPAIRMENTS: decreased activity tolerance, decreased endurance, decreased strength, and pain.   ACTIVITY LIMITATIONS: carrying, lifting, squatting, stairs, transfers, and locomotion level  PARTICIPATION LIMITATIONS: meal prep, cleaning, shopping, community activity, and yard work  PERSONAL FACTORS: 1-2 comorbidities: see PmHx  are also affecting patient's functional outcome.   REHAB POTENTIAL: Fair to good  CLINICAL DECISION MAKING: Evolving/moderate complexity  EVALUATION COMPLEXITY: High   GOALS: Goals reviewed with patient? Yes  SHORT TERM GOALS: Target date: 08/22/23  Pt to meet stated Foto Goal Baseline: Goal status: Met 08/20/23  2.  Pt will amb continuously x 10 minutes submerged without increase in LE pain Baseline: unable to land based at eval;  Goal status: MET - 08/08/23  3.  .Pt will improve strength in R hip to within 5lbs of contralateral side to demonstrate improved overall physical function Baseline: see chart Goal status: Met 08/20/23  4.  Pt will tolerate full aquatic sessions consistently without increase in pain and with improving function to demonstrate good toleration and effectiveness of intervention.  Baseline:  Goal status: MET - 08/08/23  5.  Pt will complete 10 consecutive STS transfers from water bench onto water step without limitation to le pain Baseline:  Goal status: MET - 08/08/23   LONG TERM GOALS: will set a re-cert if approp  PLAN:  PT FREQUENCY: 1-2x/week  PT DURATION: 5 weeks  PLANNED INTERVENTIONS: 97164- PT Re-evaluation, 97110-Therapeutic exercises, 97530- Therapeutic activity, 97112- Neuromuscular re-education, 97535- Self Care, 16109- Manual therapy, [id number]- Gait training, [phone]- Orthotic  Fit/training, [phone]- Aquatic Therapy, [phone]- Electrical stimulation (unattended), Patient/Family education, Balance training, Stair training, Taping, Dry Needling, Joint mobilization, Cryotherapy, and Moist heat.  PLAN FOR NEXT SESSION: continue aquatics   Corrie Dandy Thayer) [place] MPT 08/20/23 6:08 PM [facility] [address]  [place], [place], [address] Phone: [phone]   Fax:  [phone]        Referring diagnosis? M54.16 (ICD-10-CM) - Lumbar radiculopathy  Treatment diagnosis? (if different than referring diagnosis) M54.16 (ICD-10-CM) - Lumbar radiculopathy  What was this (referring dx) caused by? []  Surgery []  Fall [x]  Ongoing issue [x]  Arthritis []  Other: ____________  Laterality: []  Rt []  Lt [x]  Both  Check all possible CPT codes:  *CHOOSE 10 OR LESS*    See Planned Interventions listed in the Plan section of the Evaluation.

## 2023-08-22 ENCOUNTER — Ambulatory Visit (HOSPITAL_BASED_OUTPATIENT_CLINIC_OR_DEPARTMENT_OTHER): Payer: Medicare HMO | Admitting: Physical Therapy

## 2023-08-23 ENCOUNTER — Encounter: Payer: Self-pay | Admitting: Family Medicine

## 2023-08-23 DIAGNOSIS — E039 Hypothyroidism, unspecified: Secondary | ICD-10-CM | POA: Insufficient documentation

## 2023-09-03 ENCOUNTER — Encounter (HOSPITAL_BASED_OUTPATIENT_CLINIC_OR_DEPARTMENT_OTHER): Payer: Self-pay | Admitting: Physical Therapy

## 2023-09-05 ENCOUNTER — Ambulatory Visit (HOSPITAL_BASED_OUTPATIENT_CLINIC_OR_DEPARTMENT_OTHER): Payer: Self-pay | Admitting: Physical Therapy

## 2023-09-10 ENCOUNTER — Ambulatory Visit: Payer: Medicare HMO | Attending: Family Medicine

## 2023-09-10 VITALS — Ht 66.0 in | Wt 160.0 lb

## 2023-09-10 DIAGNOSIS — Z0100 Encounter for examination of eyes and vision without abnormal findings: Secondary | ICD-10-CM

## 2023-09-10 DIAGNOSIS — Z0000 Encounter for general adult medical examination without abnormal findings: Secondary | ICD-10-CM | POA: Diagnosis not present

## 2023-09-10 NOTE — Patient Instructions (Addendum)
 Martha Koch , Thank you for taking time to come for your Medicare Wellness Visit. I appreciate your ongoing commitment to your health goals. Please review the following plan we discussed and let me know if I can assist you in the future.   Referrals/Orders/Follow-Ups/Clinician Recommendations: Yes; Keep maintaining your health by keeping your appointments with Dr. Alvis Lemmings and any specialists that you may see.  Call us if you need anything.  Have a great year!!!!  This is a list of the screening recommended for you and due dates:  Health Maintenance  Topic Date Due   COVID-19 Vaccine (1) Never done   Flu Shot  09/30/2023*   Zoster (Shingles) Vaccine (1 of 2) 11/04/2023*   Screening for Lung Cancer  11/09/2023   Medicare Annual Wellness Visit  09/09/2024   Mammogram  01/31/2025   DTaP/Tdap/Td vaccine (2 - Td or Tdap) 08/25/2025   Colon Cancer Screening  01/04/2030   Pneumonia Vaccine  Completed   DEXA scan (bone density measurement)  Completed   Hepatitis C Screening  Completed   HPV Vaccine  Aged Out  *Topic was postponed. The date shown is not the original due date.    Advanced directives: (Declined) Advance directive discussed with you today. Even though you declined this today, please call our office should you change your mind, and we can give you the proper paperwork for you to fill out.  Next Medicare Annual Wellness Visit scheduled for next year: Yes

## 2023-09-10 NOTE — Progress Notes (Signed)
 Subjective:   Martha Koch is a 68 y.o. who presents for a Medicare Wellness preventive visit.  Visit Complete: Virtual I connected with  Prince Solian on 09/10/23 by a audio enabled telemedicine application and verified that I am speaking with the correct person using two identifiers.  Patient Location: Home  Provider Location: Office/Clinic  I discussed the limitations of evaluation and management by telemedicine. The patient expressed understanding and agreed to proceed.  Vital Signs: Because this visit was a virtual/telehealth visit, some criteria may be missing or patient reported. Any vitals not documented were not able to be obtained and vitals that have been documented are patient reported.  VideoDeclined- This patient declined Librarian, academic. Therefore the visit was completed with audio only.  AWV Questionnaire: No: Patient Medicare AWV questionnaire was not completed prior to this visit.  Cardiac Risk Factors include: advanced age (>73men, >52 women);dyslipidemia;family history of premature cardiovascular disease;hypertension     Objective:    Today's Vitals   09/10/23 1542  Weight: 160 lb (72.6 kg)  Height: 5\' 6"  (1.676 m)  PainSc: 0-No pain   Body mass index is 25.82 kg/m.     09/10/2023    3:44 PM 07/19/2023   11:27 AM 10/03/2022    3:30 PM 09/16/2022    7:46 PM 09/16/2022    3:40 PM 09/14/2022    4:15 PM 09/05/2021    3:10 PM  Advanced Directives  Does Patient Have a Medical Advance Directive? No No No No No No No  Would patient like information on creating a medical advance directive? No - Patient declined No - Patient declined  No - Patient declined   No - Patient declined    Current Medications (verified) Outpatient Encounter Medications as of 09/10/2023  Medication Sig   albuterol (PROVENTIL) (2.5 MG/3ML) 0.083% nebulizer solution Take 3 mLs (2.5 mg total) by nebulization every 6 (six) hours as needed for wheezing or  shortness of breath. (Patient not taking: Reported on 08/07/2023)   albuterol (VENTOLIN HFA) 108 (90 Base) MCG/ACT inhaler Inhale 2 puffs into the lungs every 6 (six) hours as needed for wheezing or shortness of breath.   amLODipine (NORVASC) 10 MG tablet Take 1 tablet (10 mg total) by mouth daily.   aspirin EC 81 MG tablet Take by mouth.   atorvastatin (LIPITOR) 80 MG tablet Take 1 tablet (80 mg total) by mouth at bedtime.   cetirizine (ZYRTEC) 10 MG tablet Take 1 tablet (10 mg total) by mouth daily. (Patient not taking: Reported on 08/07/2023)   cholecalciferol 25 MCG (1000 UT) tablet Take 1,000 Units by mouth daily.   fluticasone (FLONASE) 50 MCG/ACT nasal spray Place 2 sprays into both nostrils daily.   levothyroxine (SYNTHROID) 50 MCG tablet Take 1 tablet (50 mcg total) by mouth daily.   omeprazole (PRILOSEC) 40 MG capsule Take 1 capsule (40 mg total) by mouth daily.   Tiotropium Bromide-Olodaterol (STIOLTO RESPIMAT) 2.5-2.5 MCG/ACT AERS Inhale 2 puffs into the lungs daily.   Facility-Administered Encounter Medications as of 09/10/2023  Medication   acetaminophen (TYLENOL) tablet 650 mg    Allergies (verified) Aspirin and Penicillins   History: Past Medical History:  Diagnosis Date   Cervical cancer (HCC) cervical   Hyperlipidemia    Hypertension    Radiation 06/02/14-07/13/14   pelvis 45 gray, vaginal cuff/parametrial boost to 50.4 gray   Radiation 04/06/15-05/13/15   anterior abdominal wall 50.4 gray   Past Surgical History:  Procedure Laterality Date  ABDOMINAL HYSTERECTOMY     COLONOSCOPY  2011   MUSCLE BIOPSY Right 09/14/2021   Procedure: MUSCLE BIOPSY RIGHT THIGH AND RIGHT LOWER LEG;  Surgeon: Almond Lint, MD;  Location: [facility] OR;  Service: General;  Laterality: Right;   robotic radical hysterectomy, bso, pelvic lymphadenectomy Bilateral 04/21/14   TUBAL LIGATION     Family History  Problem Relation Age of Onset   Hypertension Father    Breast cancer Maternal Aunt         breast cancer, x 4 aunts    Breast cancer Daughter    Cancer Other    Diabetes Other    Colon cancer Neg Hx    Colon polyps Neg Hx    Esophageal cancer Neg Hx    Stomach cancer Neg Hx    Rectal cancer Neg Hx    Neuropathy Neg Hx    Social History   Socioeconomic History   Marital status: Single    Spouse name: Not on file   Number of children: 3   Years of education: 12   Highest education level: Not on file  Occupational History   Occupation: [employer]     Employer: [employer]  Tobacco Use   Smoking status: Former    Current packs/day: 0.00    Average packs/day: 0.5 packs/day for 30.0 years (15.0 ttl pk-yrs)    Types: Cigarettes    Start date: 09/11/1992    Quit date: 09/12/2022    Years since quitting: 0.9   Smokeless tobacco: Never  Vaping Use   Vaping status: Never Used  Substance and Sexual Activity   Alcohol use: No    Alcohol/week: 0.0 standard drinks of alcohol   Drug use: No   Sexual activity: Not Currently  Other Topics Concern   Not on file  Social History Narrative   Has 3 children. They live in [place].   Has 3 grandchildren.    [name] (oldest daughter)    Lives alone.    Social Drivers of Corporate investment banker Strain: Low Risk  (09/10/2023)   Overall Financial Resource Strain (CARDIA)    Difficulty of Paying Living Expenses: Not hard at all  Food Insecurity: No Food Insecurity (09/10/2023)   Hunger Vital Sign    Worried About Running Out of Food in the Last Year: Never true    Ran Out of Food in the Last Year: Never true  Transportation Needs: No Transportation Needs (09/10/2023)   PRAPARE - Administrator, Civil Service (Medical): No    Lack of Transportation (Non-Medical): No  Physical Activity: Sufficiently Active (09/10/2023)   Exercise Vital Sign    Days of Exercise per Week: 4 days    Minutes of Exercise per Session: 60 min  Stress: No Stress Concern Present (09/10/2023)   [employer] of  Occupational Health - Occupational Stress Questionnaire    Feeling of Stress : Not at all  Social Connections: Moderately Integrated (09/10/2023)   Social Connection and Isolation Panel [NHANES]    Frequency of Communication with Friends and Family: More than three times a week    Frequency of Social Gatherings with Friends and Family: More than three times a week    Attends Religious Services: More than 4 times per year    Active Member of [employer] or Organizations: Yes    Attends Banker Meetings: More than 4 times per year    Marital Status: Never married    Tobacco Counseling Counseling given: Not  Answered    Clinical Intake:  Pre-visit preparation completed: Yes  Pain : No/denies pain Pain Score: 0-No pain     BMI - recorded: 25.82 Nutritional Status: BMI 25 -29 Overweight Nutritional Risks: None Diabetes: No  How often do you need to have someone help you when you read instructions, pamphlets, or other written materials from your doctor or pharmacy?: 1 - Never What is the last grade level you completed in school?: HSG  Interpreter Needed?: No  Information entered by :: Kellee Sittner N. Kennadi Albany, LPN.   Activities of Daily Living     09/10/2023    3:46 PM 10/03/2022    3:30 PM  In your present state of health, do you have any difficulty performing the following activities:  Hearing? 0 0  Vision? 0 0  Difficulty concentrating or making decisions? 0 0  Comment BSE: games on phone, sewing, crotchet   Walking or climbing stairs? 0 1  Dressing or bathing? 0 0  Doing errands, shopping? 0 0  Preparing Food and eating ? N N  Using the Toilet? N N  In the past six months, have you accidently leaked urine? N N  Do you have problems with loss of bowel control? N N  Managing your Medications? N N  Managing your Finances? N N  Housekeeping or managing your Housekeeping? N N    Patient Care Team: Hoy Register, MD as PCP - General (Family Medicine)  Indicate  any recent Medical Services you may have received from other than [facility] providers in the past year (date may be approximate).     Assessment:   This is a routine wellness examination for [place].  Hearing/Vision screen Hearing Screening - Comments:: Denies hearing difficulties.   Vision Screening - Comments:: Wears reading glasses - not up to date with routine eye exams. Referral placed.    Goals Addressed               This Visit's Progress     Patient Stated (pt-stated)        My healthcare goal for 2025 is to stay healthy, continue my water aerobics because I want to stay mobile and get more strength in my legs.       Depression Screen     09/10/2023    3:53 PM 08/07/2023    2:06 PM 06/17/2023   10:56 AM 05/16/2023    1:41 PM 03/14/2023    8:48 AM 02/04/2023    2:43 PM 10/03/2022    3:30 PM  PHQ 2/9 Scores  PHQ - 2 Score 0 0 0 0 0 0 0  PHQ- 9 Score 0 0 0 4 0 0     Fall Risk     09/10/2023    3:45 PM 08/07/2023    2:05 PM 06/17/2023   10:07 AM 05/16/2023    1:41 PM 05/09/2023    2:54 PM  Fall Risk   Falls in the past year? 0 0 0 0 0  Number falls in past yr: 0 0 0 0   Injury with Fall? 0 0  0   Risk for fall due to : No Fall Risks No Fall Risks No Fall Risks No Fall Risks   Follow up Falls prevention discussed;Falls evaluation completed Falls evaluation completed Falls evaluation completed Falls evaluation completed     MEDICARE RISK AT HOME:  Medicare Risk at Home Any stairs in or around the home?: No If so, are there any without handrails?: No Home free of  loose throw rugs in walkways, pet beds, electrical cords, etc?: Yes Adequate lighting in your home to reduce risk of falls?: Yes Life alert?: No Use of a cane, walker or w/c?: No Grab bars in the bathroom?: No (patient is very careful) Shower chair or bench in shower?: No Elevated toilet seat or a handicapped toilet?: Yes  TIMED UP AND GO:  Was the test performed?  No  Cognitive Function: 6CIT  completed    09/10/2023    3:46 PM  MMSE - Mini Mental State Exam  Not completed: Unable to complete        09/10/2023    3:46 PM 10/03/2022    3:32 PM  6CIT Screen  What Year? 0 points 0 points  What month? 0 points 0 points  What time? 0 points 0 points  Count back from 20 0 points 0 points  Months in reverse 0 points 0 points  Repeat phrase 0 points 2 points  Total Score 0 points 2 points    Immunizations Immunization History  Administered Date(s) Administered   Fluad Quad(high Dose 65+) 08/08/2022   Influenza,inj,Quad PF,6+ Mos 03/02/2014, 04/02/2016   Influenza-Unspecified 03/02/2017, 04/29/2020   PNEUMOCOCCAL CONJUGATE-20 08/08/2022   PPD Test 08/27/2018   Tdap 08/26/2015    Screening Tests Health Maintenance  Topic Date Due   COVID-19 Vaccine (1) Never done   INFLUENZA VACCINE  09/30/2023 (Originally 01/31/2023)   Zoster Vaccines- Shingrix (1 of 2) 11/04/2023 (Originally 02/28/1975)   Lung Cancer Screening  11/09/2023   Medicare Annual Wellness (AWV)  09/09/2024   MAMMOGRAM  01/31/2025   DTaP/Tdap/Td (2 - Td or Tdap) 08/25/2025   Colonoscopy  01/04/2030   Pneumonia Vaccine 73+ Years old  Completed   DEXA SCAN  Completed   Hepatitis C Screening  Completed   HPV VACCINES  Aged Out    Health Maintenance  Health Maintenance Due  Topic Date Due   COVID-19 Vaccine (1) Never done   Health Maintenance Items Addressed: Referral sent to Optometry/Ophthalmology.  Additional Screening:  Vision Screening: Recommended annual ophthalmology exams for early detection of glaucoma and other disorders of the eye.  Dental Screening: Recommended annual dental exams for proper oral hygiene  Community Resource Referral / Chronic Care Management: CRR required this visit?  No   CCM required this visit?  No     Plan:     I have personally reviewed and noted the following in the patient's chart:   Medical and social history Use of alcohol, tobacco or illicit drugs   Current medications and supplements including opioid prescriptions. Patient is not currently taking opioid prescriptions. Functional ability and status Nutritional status Physical activity Advanced directives List of other physicians Hospitalizations, surgeries, and ER visits in previous 12 months Vitals Screenings to include cognitive, depression, and falls Referrals and appointments  In addition, I have reviewed and discussed with patient certain preventive protocols, quality metrics, and best practice recommendations. A written personalized care plan for preventive services as well as general preventive health recommendations were provided to patient.     Mickeal Needy, LPN   9/56/2130   After Visit Summary: (MyChart) Due to this being a telephonic visit, the after visit summary with patients personalized plan was offered to patient via MyChart   Notes: Please refer to Routing Comments.

## 2023-10-28 DIAGNOSIS — M6289 Other specified disorders of muscle: Secondary | ICD-10-CM | POA: Diagnosis not present

## 2023-10-31 ENCOUNTER — Ambulatory Visit: Payer: Self-pay | Admitting: *Deleted

## 2023-10-31 ENCOUNTER — Ambulatory Visit: Admitting: Pharmacist

## 2023-10-31 NOTE — Telephone Encounter (Signed)
 Can I double book this patient on a Thursday either 830 or 110

## 2023-10-31 NOTE — Telephone Encounter (Signed)
 Copied from CRM [id number]. Topic: General - Other >> Oct 31, 2023 10:09 AM Jeris Montes S wrote: Reason for CRM: Patient is requesting a cb with a appt sooner than 061/16- she stated she was ready for her appt that was scheduled for today and the office called and told her they made the mistake of scheduling her for today and that now she needs to come in 06/16- patient states the new date is over a month away and this is unacceptable-she requests a cb Reason for Disposition  Requesting regular office appointment    No sooner appts available    Has appt with Dr. Newlin for 12/16/2023 at 3:30.  Answer Assessment - Initial Assessment Questions 1. REASON FOR CALL or QUESTION: "What is your reason for calling today?" or "How can I best help you?" or "What question do you have that I can help answer?"     This pt had an appt for 10/31/2023 but it was cancelled by [facility].  (It looks like it was made with a pharmacist instead of a provider, from what I can see).  See pt's note.  She called in upset that her appt for today was cancelled and she now can't be seen until 12/16/2023 with Dr Newlin.    There are no sooner appts available.  Forwarding to [facility] and [facility] to see if they can see her sooner as I don't have any further options to offer pt.  Protocols used: Information Only Call - No Triage-A-AH  Chief Complaint: Pt upset that appt today was cancelled by practice.   (Looks like it was scheduled with a pharmacist instead of a provider).  Pt wanting a sooner appt than 6/16 with Dr. Newlin.    No sooner appts available so forwarding to [facility] for scheduling. Symptoms: For 6 month check Frequency: N/A Pertinent Negatives: Patient denies N/A Disposition: [] ED /[] Urgent Care (no appt availability in office) / [] Appointment(In office/virtual)/ []  [facility]/ [] Home Care/ [] Refused Recommended Disposition /[] [facility]/ [x]  Follow-up  with PCP Additional Notes: Forwarded pt's request for sooner appt to practice for scheduling because I do not have anything else to offer as far as scheduling.

## 2023-11-01 NOTE — Telephone Encounter (Signed)
 Yes, you can double book at 11:10 AM on a Thursday.  Thank you

## 2023-11-01 NOTE — Telephone Encounter (Signed)
 Patient was called and a VM was left informing her to return phone call to get earlier appointment.

## 2023-11-06 DIAGNOSIS — H2513 Age-related nuclear cataract, bilateral: Secondary | ICD-10-CM | POA: Diagnosis not present

## 2023-11-06 DIAGNOSIS — H5203 Hypermetropia, bilateral: Secondary | ICD-10-CM | POA: Diagnosis not present

## 2023-11-07 ENCOUNTER — Encounter: Payer: Self-pay | Admitting: Family Medicine

## 2023-11-07 ENCOUNTER — Ambulatory Visit: Attending: Family Medicine | Admitting: Family Medicine

## 2023-11-07 ENCOUNTER — Other Ambulatory Visit: Payer: Self-pay

## 2023-11-07 VITALS — BP 121/76 | HR 78 | Ht 66.0 in | Wt 159.2 lb

## 2023-11-07 DIAGNOSIS — E039 Hypothyroidism, unspecified: Secondary | ICD-10-CM | POA: Diagnosis not present

## 2023-11-07 DIAGNOSIS — M5432 Sciatica, left side: Secondary | ICD-10-CM

## 2023-11-07 MED ORDER — TIZANIDINE HCL 4 MG PO TABS
4.0000 mg | ORAL_TABLET | Freq: Three times a day (TID) | ORAL | 1 refills | Status: DC | PRN
  Filled 2023-11-07: qty 60, 20d supply, fill #0
  Filled 2023-12-30: qty 60, 20d supply, fill #1

## 2023-11-07 NOTE — Patient Instructions (Signed)

## 2023-11-07 NOTE — Progress Notes (Signed)
 Subjective:  Patient ID: Martha Koch, female    DOB: 1956-04-08  Age: 68 y.o. MRN: [id number]  CC: Leg Pain (Pain in left thigh)     Discussed the use of AI scribe software for clinical note transcription with the patient, who gave verbal consent to proceed.  History of Present Illness Martha Koch is a 68 year old female with a history of  hypertension, prediabetes, Emphysema, previous history of cervical cancer (status post total abdominal hysterectomy and salpingo-oophorectomy, s/p chemo and radiation), Previous nicotine  dependence (quit in 08/2022), PVD, lumbar radiculopathy, hypothyroidism who presents with left leg pain radiating to her back.  She experiences pain in her left leg radiating from the back of her leg to her hip and back, described as a muscle pulling sensation. This causes significant discomfort and difficulty in standing or finding a comfortable position. The pain persisted for two weeks before subsiding and has recently started to return slowly. There is no numbness or tingling in the leg. A vascular evaluation at [facility] confirmed no vascular blockage. For pain management, she takes Midol  and uses tizanidine as needed, up to three times a day. She completed physical therapy and continues exercises, including water aerobics at the [facility].    Past Medical History:  Diagnosis Date   Cervical cancer (HCC) cervical   Hyperlipidemia    Hypertension    Radiation 06/02/14-07/13/14   pelvis 45 gray, vaginal cuff/parametrial boost to 50.4 gray   Radiation 04/06/15-05/13/15   anterior abdominal wall 50.4 gray    Past Surgical History:  Procedure Laterality Date   ABDOMINAL HYSTERECTOMY     COLONOSCOPY  2011   MUSCLE BIOPSY Right 09/14/2021   Procedure: MUSCLE BIOPSY RIGHT THIGH AND RIGHT LOWER LEG;  Surgeon: Lockie Rima, MD;  Location: [facility] OR;  Service: General;  Laterality: Right;   robotic radical hysterectomy, bso, pelvic lymphadenectomy Bilateral  04/21/14   TUBAL LIGATION      Family History  Problem Relation Age of Onset   Hypertension Father    Breast cancer Maternal Aunt        breast cancer, x 4 aunts    Breast cancer Daughter    Cancer Other    Diabetes Other    Colon cancer Neg Hx    Colon polyps Neg Hx    Esophageal cancer Neg Hx    Stomach cancer Neg Hx    Rectal cancer Neg Hx    Neuropathy Neg Hx     Social History   Socioeconomic History   Marital status: Single    Spouse name: Not on file   Number of children: 3   Years of education: 12   Highest education level: Not on file  Occupational History   Occupation: [employer]     Employer: [employer]  Tobacco Use   Smoking status: Former    Current packs/day: 0.00    Average packs/day: 0.5 packs/day for 30.0 years (15.0 ttl pk-yrs)    Types: Cigarettes    Start date: 09/11/1992    Quit date: 09/12/2022    Years since quitting: 1.1   Smokeless tobacco: Never  Vaping Use   Vaping status: Never Used  Substance and Sexual Activity   Alcohol use: No    Alcohol/week: 0.0 standard drinks of alcohol   Drug use: No   Sexual activity: Not Currently  Other Topics Concern   Not on file  Social History Narrative   Has 3 children. They live in [place].  Has 3 grandchildren.    Sunny English (oldest daughter)    Lives alone.    Social Drivers of Corporate investment banker Strain: Low Risk  (09/10/2023)   Overall Financial Resource Strain (CARDIA)    Difficulty of Paying Living Expenses: Not hard at all  Food Insecurity: No Food Insecurity (09/10/2023)   Hunger Vital Sign    Worried About Running Out of Food in the Last Year: Never true    Ran Out of Food in the Last Year: Never true  Transportation Needs: No Transportation Needs (09/10/2023)   PRAPARE - Administrator, Civil Service (Medical): No    Lack of Transportation (Non-Medical): No  Physical Activity: Sufficiently Active (09/10/2023)   Exercise Vital Sign    Days of  Exercise per Week: 4 days    Minutes of Exercise per Session: 60 min  Stress: No Stress Concern Present (09/10/2023)   [employer] of Occupational Health - Occupational Stress Questionnaire    Feeling of Stress : Not at all  Social Connections: Moderately Integrated (09/10/2023)   Social Connection and Isolation Panel [NHANES]    Frequency of Communication with Friends and Family: More than three times a week    Frequency of Social Gatherings with Friends and Family: More than three times a week    Attends Religious Services: More than 4 times per year    Active Member of [employer] or Organizations: Yes    Attends Engineer, structural: More than 4 times per year    Marital Status: Never married    Allergies  Allergen Reactions   Aspirin     Stomach cramps   Penicillins Hives    Did it involve swelling of the face/tongue/throat, SOB, or low BP? No Did it involve sudden or severe rash/hives, skin peeling, or any reaction on the inside of your mouth or nose? Yes Did you need to seek medical attention at a hospital or doctor's office? Yes When did it last happen?    Several Years Ago   If all above answers are "NO", may proceed with cephalosporin use.      Outpatient Medications Prior to Visit  Medication Sig Dispense Refill   albuterol  (PROVENTIL ) (2.5 MG/3ML) 0.083% nebulizer solution Take 3 mLs (2.5 mg total) by nebulization every 6 (six) hours as needed for wheezing or shortness of breath. 180 mL 1   albuterol  (VENTOLIN  HFA) 108 (90 Base) MCG/ACT inhaler Inhale 2 puffs into the lungs every 6 (six) hours as needed for wheezing or shortness of breath. 18 g 2   amLODipine  (NORVASC ) 10 MG tablet Take 1 tablet (10 mg total) by mouth daily. 90 tablet 1   aspirin EC 81 MG tablet Take by mouth.     atorvastatin  (LIPITOR) 80 MG tablet Take 1 tablet (80 mg total) by mouth at bedtime. 90 tablet 3   cetirizine  (ZYRTEC ) 10 MG tablet Take 1 tablet (10 mg total) by mouth daily. 100  tablet 1   cholecalciferol 25 MCG (1000 UT) tablet Take 1,000 Units by mouth daily.     fluticasone  (FLONASE ) 50 MCG/ACT nasal spray Place 2 sprays into both nostrils daily. 16 g 6   levothyroxine  (SYNTHROID ) 50 MCG tablet Take 1 tablet (50 mcg total) by mouth daily. 90 tablet 1   omeprazole  (PRILOSEC) 40 MG capsule Take 1 capsule (40 mg total) by mouth daily. 30 capsule 3   Tiotropium Bromide-Olodaterol (STIOLTO RESPIMAT ) 2.5-2.5 MCG/ACT AERS Inhale 2 puffs into the lungs daily. 4  g 6   Facility-Administered Medications Prior to Visit  Medication Dose Route Frequency Provider Last Rate Last Admin   acetaminophen  (TYLENOL ) tablet 650 mg  650 mg Oral Q6H PRN Vernell Goldsmith, MD   650 mg at 03/14/23 0922     ROS Review of Systems  Constitutional:  Negative for activity change and appetite change.  HENT:  Negative for sinus pressure and sore throat.   Respiratory:  Negative for chest tightness, shortness of breath and wheezing.   Cardiovascular:  Negative for chest pain and palpitations.  Gastrointestinal:  Negative for abdominal distention, abdominal pain and constipation.  Genitourinary: Negative.   Musculoskeletal:        See HPI  Psychiatric/Behavioral:  Negative for behavioral problems and dysphoric mood.     Objective:  BP 121/76   Pulse 78   Ht 5\' 6"  (1.676 m)   Wt 159 lb 3.2 oz (72.2 kg)   SpO2 97%   BMI 25.70 kg/m      11/07/2023   11:26 AM 09/10/2023    3:42 PM 08/07/2023    2:30 PM  BP/Weight  Systolic BP 121  960  Diastolic BP 76  78  Wt. (Lbs) 159.2 160   BMI 25.7 kg/m2 25.82 kg/m2       Physical Exam Constitutional:      Appearance: She is well-developed.  Cardiovascular:     Rate and Rhythm: Normal rate.     Heart sounds: Normal heart sounds. No murmur heard. Pulmonary:     Effort: Pulmonary effort is normal.     Breath sounds: Normal breath sounds. No wheezing or rales.  Chest:     Chest wall: No tenderness.  Abdominal:     General: Bowel sounds  are normal. There is no distension.     Palpations: Abdomen is soft. There is no mass.     Tenderness: There is no abdominal tenderness.  Musculoskeletal:     Right lower leg: No edema.     Left lower leg: No edema.     Comments: Positive straight leg raise on the left negative straight leg raise on the right  Neurological:     Mental Status: She is alert and oriented to person, place, and time.  Psychiatric:        Mood and Affect: Mood normal.        Latest Ref Rng & Units 05/16/2023    2:08 PM 02/08/2023   11:05 AM 09/17/2022    4:06 AM  CMP  Glucose 70 - 99 mg/dL 85  454  098   BUN 8 - 27 mg/dL 6  9  12    Creatinine 0.57 - 1.00 mg/dL 1.19  1.47  8.29   Sodium 134 - 144 mmol/L 146  144  139   Potassium 3.5 - 5.2 mmol/L 4.0  4.3  3.9   Chloride 96 - 106 mmol/L 106  107  108   CO2 20 - 29 mmol/L 16  22  22    Calcium  8.7 - 10.3 mg/dL 56.2  9.5  8.6   Total Protein 6.0 - 8.5 g/dL 7.5  6.7    Total Bilirubin 0.0 - 1.2 mg/dL 0.2  0.3    Alkaline Phos 44 - 121 IU/L 114  93    AST 0 - 40 IU/L 24  37    ALT 0 - 32 IU/L 14  42      Lipid Panel     Component Value Date/Time   CHOL 146 02/08/2023  1105   TRIG 49 02/08/2023 1105   HDL 67 02/08/2023 1105   CHOLHDL 2.4 04/18/2021 1600   CHOLHDL 2.9 08/26/2015 0945   VLDL 12 08/26/2015 0945   LDLCALC 68 02/08/2023 1105    CBC    Component Value Date/Time   WBC 4.8 05/16/2023 1408   WBC 3.3 (L) 09/17/2022 0406   RBC 5.36 (H) 05/16/2023 1408   RBC 5.04 09/17/2022 0406   HGB 13.4 05/16/2023 1408   HGB 12.8 11/18/2014 0902   HCT 42.0 05/16/2023 1408   HCT 38.9 11/18/2014 0902   PLT 423 05/16/2023 1408   MCV 78 (L) 05/16/2023 1408   MCV 82.4 11/18/2014 0902   MCH 25.0 (L) 05/16/2023 1408   MCH 26.4 09/17/2022 0406   MCHC 31.9 05/16/2023 1408   MCHC 32.2 09/17/2022 0406   RDW 15.3 05/16/2023 1408   RDW 13.2 11/18/2014 0902   LYMPHSABS 2.0 05/16/2023 1408   LYMPHSABS 0.9 11/18/2014 0902   MONOABS 0.2 09/17/2022 0406    MONOABS 0.3 11/18/2014 0902   EOSABS 0.1 05/16/2023 1408   BASOSABS 0.0 05/16/2023 1408   BASOSABS 0.0 11/18/2014 0902    Lab Results  Component Value Date   HGBA1C 6.0 08/07/2023        Assessment & Plan Sciatica Chronic sciatica with pain radiating to the left leg and hip, likely due to a pinched nerve. Managed with exercises and medication. - Prescribed tizanidine as needed, up to three times daily, for pain management. Discussed potential drowsiness. - Provided sciatica exercises for home practice. - Continue water aerobics. - Offer formal physical therapy if symptoms persist she wants to hold off at this time    Hypothyroidism - She has been compliant with her levothyroxine  and denies presence of reflux symptoms with it - Last TSH was elevated - Will check thyroid  panel, and adjust regimen accordingly - Check lipid panel and comprehensive metabolic panel   Meds ordered this encounter  Medications   tiZANidine (ZANAFLEX) 4 MG tablet    Sig: Take 1 tablet (4 mg total) by mouth every 8 (eight) hours as needed.    Dispense:  60 tablet    Refill:  1    Follow-up: Return in about 3 months (around 02/07/2024) for Chronic medical conditions, cancel previous appointment.       Joaquin Mulberry, MD, FAAFP. [facility] [place], [place] [phone]   11/07/2023, 1:02 PM

## 2023-11-08 ENCOUNTER — Other Ambulatory Visit: Payer: Self-pay

## 2023-11-08 ENCOUNTER — Ambulatory Visit: Payer: Self-pay

## 2023-11-08 ENCOUNTER — Other Ambulatory Visit: Payer: Self-pay | Admitting: Family Medicine

## 2023-11-08 DIAGNOSIS — E039 Hypothyroidism, unspecified: Secondary | ICD-10-CM

## 2023-11-08 LAB — CMP14+EGFR
ALT: 15 IU/L (ref 0–32)
AST: 16 IU/L (ref 0–40)
Albumin: 4.7 g/dL (ref 3.9–4.9)
Alkaline Phosphatase: 99 IU/L (ref 44–121)
BUN/Creatinine Ratio: 14 (ref 12–28)
BUN: 13 mg/dL (ref 8–27)
Bilirubin Total: 0.3 mg/dL (ref 0.0–1.2)
CO2: 22 mmol/L (ref 20–29)
Calcium: 10.1 mg/dL (ref 8.7–10.3)
Chloride: 105 mmol/L (ref 96–106)
Creatinine, Ser: 0.95 mg/dL (ref 0.57–1.00)
Globulin, Total: 2.4 g/dL (ref 1.5–4.5)
Glucose: 97 mg/dL (ref 70–99)
Potassium: 5 mmol/L (ref 3.5–5.2)
Sodium: 143 mmol/L (ref 134–144)
Total Protein: 7.1 g/dL (ref 6.0–8.5)
eGFR: 65 mL/min/{1.73_m2} (ref >59)

## 2023-11-08 LAB — T4, FREE: Free T4: 1.62 ng/dL (ref 0.82–1.77)

## 2023-11-08 LAB — LP+NON-HDL CHOLESTEROL
Cholesterol, Total: 142 mg/dL (ref 100–199)
HDL: 59 mg/dL (ref >39)
LDL Chol Calc (NIH): 71 mg/dL (ref 0–99)
Total Non-HDL-Chol (LDL+VLDL): 83 mg/dL (ref 0–129)
Triglycerides: 55 mg/dL (ref 0–149)
VLDL Cholesterol Cal: 12 mg/dL (ref 5–40)

## 2023-11-08 LAB — T3: T3, Total: 193 ng/dL — ABNORMAL HIGH (ref 71–180)

## 2023-11-08 LAB — TSH: TSH: 0.048 u[IU]/mL — ABNORMAL LOW (ref 0.450–4.500)

## 2023-11-08 MED ORDER — LEVOTHYROXINE SODIUM 25 MCG PO TABS
25.0000 ug | ORAL_TABLET | Freq: Every day | ORAL | 1 refills | Status: DC
  Filled 2023-11-08: qty 30, 30d supply, fill #0
  Filled 2023-12-13: qty 30, 30d supply, fill #1
  Filled 2024-01-24: qty 30, 30d supply, fill #2

## 2023-11-08 NOTE — Telephone Encounter (Signed)
 Noted.

## 2023-11-08 NOTE — Telephone Encounter (Signed)
  Additional Notes: Patient wanted to know what normal reference ranges are for free T4, TSH and T3, total. Provided reference ranges and read MD's note :" thyroid  reveals she is tending more towards the hyperthyroid range and I have decreased her levothyroxine  dose to 25 mcg and sent a prescription to the pharmacy.  I will repeat her thyroid  labs at her next visit.  Thank you. " Patient verbalizes understanding.  Reason for Disposition  [1] Other NON-URGENT information for PCP AND [2] does not require PCP response  Answer Assessment - Initial Assessment Questions 1. REASON FOR CALL or QUESTION: "What is your reason for calling today?" or "How can I best help you?" or "What question do you have that I can help answer?"     Patient wanted to know what the normal reference ranges are for thyroid  levels.  2. CALLER: Document the source of call. (e.g., laboratory, patient).     Patient.  Protocols used: PCP Call - No Triage-A-AH

## 2023-11-11 ENCOUNTER — Other Ambulatory Visit: Payer: Self-pay

## 2023-11-12 ENCOUNTER — Ambulatory Visit: Payer: Self-pay

## 2023-12-13 ENCOUNTER — Other Ambulatory Visit: Payer: Self-pay

## 2023-12-16 ENCOUNTER — Ambulatory Visit: Payer: Medicare HMO | Admitting: Family Medicine

## 2023-12-30 ENCOUNTER — Other Ambulatory Visit: Payer: Self-pay

## 2024-01-01 ENCOUNTER — Other Ambulatory Visit: Payer: Self-pay | Admitting: Family Medicine

## 2024-01-01 DIAGNOSIS — Z1231 Encounter for screening mammogram for malignant neoplasm of breast: Secondary | ICD-10-CM

## 2024-01-24 ENCOUNTER — Other Ambulatory Visit: Payer: Self-pay

## 2024-02-03 ENCOUNTER — Ambulatory Visit
Admission: RE | Admit: 2024-02-03 | Discharge: 2024-02-03 | Disposition: A | Discharge status: Home / Self Care | Source: Ambulatory Visit | Attending: Family Medicine | Admitting: Family Medicine

## 2024-02-03 DIAGNOSIS — Z1231 Encounter for screening mammogram for malignant neoplasm of breast: Secondary | ICD-10-CM | POA: Diagnosis not present

## 2024-02-06 ENCOUNTER — Ambulatory Visit: Payer: Self-pay | Admitting: Family Medicine

## 2024-02-11 ENCOUNTER — Ambulatory Visit: Admitting: Family Medicine

## 2024-02-17 ENCOUNTER — Encounter: Payer: Self-pay | Admitting: Family Medicine

## 2024-02-17 ENCOUNTER — Ambulatory Visit: Attending: Family Medicine | Admitting: Family Medicine

## 2024-02-17 ENCOUNTER — Other Ambulatory Visit: Payer: Self-pay

## 2024-02-17 VITALS — BP 135/81 | HR 85 | Ht 66.0 in | Wt 156.8 lb

## 2024-02-17 DIAGNOSIS — F17210 Nicotine dependence, cigarettes, uncomplicated: Secondary | ICD-10-CM

## 2024-02-17 DIAGNOSIS — I10 Essential (primary) hypertension: Secondary | ICD-10-CM

## 2024-02-17 DIAGNOSIS — R7303 Prediabetes: Secondary | ICD-10-CM

## 2024-02-17 DIAGNOSIS — E039 Hypothyroidism, unspecified: Secondary | ICD-10-CM

## 2024-02-17 DIAGNOSIS — R29898 Other symptoms and signs involving the musculoskeletal system: Secondary | ICD-10-CM

## 2024-02-17 DIAGNOSIS — E785 Hyperlipidemia, unspecified: Secondary | ICD-10-CM | POA: Diagnosis not present

## 2024-02-17 MED ORDER — ATORVASTATIN CALCIUM 80 MG PO TABS
80.0000 mg | ORAL_TABLET | Freq: Every day | ORAL | 1 refills | Status: AC
  Filled 2024-02-17: qty 90, 90d supply, fill #0
  Filled 2024-06-08 – 2024-07-09 (×2): qty 90, 90d supply, fill #1

## 2024-02-17 MED ORDER — AMLODIPINE BESYLATE 10 MG PO TABS
10.0000 mg | ORAL_TABLET | Freq: Every day | ORAL | 1 refills | Status: AC
  Filled 2024-02-17 – 2024-02-26 (×3): qty 90, 90d supply, fill #0
  Filled 2024-07-09: qty 90, 90d supply, fill #1

## 2024-02-17 NOTE — Patient Instructions (Signed)
 VISIT SUMMARY:  Today, we reviewed your current medications and overall health. We discussed your thyroid  medication, blood pressure, cholesterol, and other health concerns. We also planned for some follow-up tests and appointments to ensure your continued well-being.  YOUR PLAN:  -HYPOTHYROIDISM: Hypothyroidism is a condition where your thyroid  gland does not produce enough thyroid  hormone. You are currently taking levothyroxine  25 mcg daily. We will order a thyroid  function test to see if your thyroid  levels are stable. If the results are normal, we can discuss the possibility of discontinuing the medication. For now, continue taking levothyroxine  25 mcg daily.  -ESSENTIAL HYPERTENSION: Essential hypertension is high blood pressure with no identifiable cause. Your blood pressure is well-controlled with amlodipine  10 mg daily. Please continue taking amlodipine  10 mg daily, and we have refilled your prescription.  -HYPERLIPIDEMIA: Hyperlipidemia is having high levels of fats (lipids) in your blood, which can increase your risk of heart disease. You are managing this with atorvastatin  80 mg at bedtime. Please continue taking atorvastatin  80 mg at bedtime, and we have refilled your prescription.  -CHRONIC LEG WEAKNESS: You have reported chronic leg weakness, which varies in severity. You have a follow-up appointment scheduled in November with a specialist to further evaluate this issue.  -CONSTIPATION: You experience occasional constipation, which you manage with a laxative as needed. Increasing your physical activity can help improve this condition.  INSTRUCTIONS:  1. Continue taking your current medications as prescribed: levothyroxine  25 mcg daily, amlodipine  10 mg daily, and atorvastatin  80 mg at bedtime. 2. Get a thyroid  function test to assess your current thyroid  status. 3. Follow up in November for your chronic leg weakness. 4. Maintain a diet high in vegetables and increase your physical  activity to help with constipation. 5. Schedule a lung cancer screening due to your extensive smoking history.

## 2024-02-17 NOTE — Progress Notes (Signed)
 Subjective:  Patient ID: Martha Koch, female    DOB: [birth year]  Age: 68 y.o. MRN: [id number]  CC: Medical Management of Chronic Issues     Discussed the use of AI scribe software for clinical note transcription with the patient, who gave verbal consent to proceed.  History of Present Illness Martha Koch is a 67 year old female with  a history of  hypertension, prediabetes, Emphysema, previous history of cervical cancer (status post total abdominal hysterectomy and salpingo-oophorectomy, s/p chemo and radiation), Previous nicotine  dependence (quit in 08/2022 after smoking for over 50 years per patient), PVD, lumbar radiculopathy, hypothyroidism  who presents for follow-up and evaluation of her thyroid  medication.  She is currently taking levothyroxine  25 mcg daily for hypothyroidism. Initially, she had difficulty tolerating the medication but is now managing well, taking it in the morning on an empty stomach. She is interested in discontinuing the medication if her thyroid  levels are stable, as her current dose is the lowest available.  She occasionally uses omeprazole  for acid reflux, triggered by dietary factors. She is also on amlodipine  for hypertension and atorvastatin  for hyperlipidemia.  She quit smoking a year and a half ago after smoking for over fifty years. She has a history of double pneumonia, which led to lung function testing. She is due for a lung cancer screening due to her extensive smoking history.  She experiences occasional constipation, which she attributes to decreased physical activity since retiring. She manages this with a laxative as needed. She maintains a diet high in vegetables and does not consume meat.  She experiences some weakness in her right leg, which varies in severity and is more noticeable when moving. She has a follow-up appointment scheduled in November for this issue.    Past Medical History:  Diagnosis Date   Cervical cancer (HCC)  cervical   Hyperlipidemia    Hypertension    Radiation 06/02/14-07/13/14   pelvis 45 gray, vaginal cuff/parametrial boost to 50.4 gray   Radiation 04/06/15-05/13/15   anterior abdominal wall 50.4 gray    Past Surgical History:  Procedure Laterality Date   ABDOMINAL HYSTERECTOMY     COLONOSCOPY  2011   MUSCLE BIOPSY Right 09/14/2021   Procedure: MUSCLE BIOPSY RIGHT THIGH AND RIGHT LOWER LEG;  Surgeon: Aron Shoulders, MD;  Location: [facility] OR;  Service: General;  Laterality: Right;   robotic radical hysterectomy, bso, pelvic lymphadenectomy Bilateral 04/21/14   TUBAL LIGATION      Family History  Problem Relation Age of Onset   Hypertension Father    Breast cancer Maternal Aunt        breast cancer, x 4 aunts    Breast cancer Daughter    Cancer Other    Diabetes Other    Colon cancer Neg Hx    Colon polyps Neg Hx    Esophageal cancer Neg Hx    Stomach cancer Neg Hx    Rectal cancer Neg Hx    Neuropathy Neg Hx     Social History   Socioeconomic History   Marital status: Single    Spouse name: Not on file   Number of children: 3   Years of education: 12   Highest education level: Not on file  Occupational History   Occupation: [employer]     Employer: [employer]  Tobacco Use   Smoking status: Former    Current packs/day: 0.00    Average packs/day: 0.5 packs/day for 30.0 years (15.0 ttl pk-yrs)  Types: Cigarettes    Start date: 09/11/1992    Quit date: 09/12/2022    Years since quitting: 1.4   Smokeless tobacco: Never  Vaping Use   Vaping status: Never Used  Substance and Sexual Activity   Alcohol use: No    Alcohol/week: 0.0 standard drinks of alcohol   Drug use: No   Sexual activity: Not Currently  Other Topics Concern   Not on file  Social History Narrative   Has 3 children. They live in [place].   Has 3 grandchildren.    [name] (oldest daughter)    Lives alone.    Social Drivers of Corporate investment banker Strain: Low Risk   (09/10/2023)   Overall Financial Resource Strain (CARDIA)    Difficulty of Paying Living Expenses: Not hard at all  Food Insecurity: No Food Insecurity (09/10/2023)   Hunger Vital Sign    Worried About Running Out of Food in the Last Year: Never true    Ran Out of Food in the Last Year: Never true  Transportation Needs: No Transportation Needs (09/10/2023)   PRAPARE - Administrator, Civil Service (Medical): No    Lack of Transportation (Non-Medical): No  Physical Activity: Sufficiently Active (09/10/2023)   Exercise Vital Sign    Days of Exercise per Week: 4 days    Minutes of Exercise per Session: 60 min  Stress: No Stress Concern Present (09/10/2023)   [employer] of Occupational Health - Occupational Stress Questionnaire    Feeling of Stress : Not at all  Social Connections: Moderately Integrated (09/10/2023)   Social Connection and Isolation Panel    Frequency of Communication with Friends and Family: More than three times a week    Frequency of Social Gatherings with Friends and Family: More than three times a week    Attends Religious Services: More than 4 times per year    Active Member of [employer] or Organizations: Yes    Attends Engineer, structural: More than 4 times per year    Marital Status: Never married    Allergies  Allergen Reactions   Aspirin     Stomach cramps   Penicillins Hives    Did it involve swelling of the face/tongue/throat, SOB, or low BP? No Did it involve sudden or severe rash/hives, skin peeling, or any reaction on the inside of your mouth or nose? Yes Did you need to seek medical attention at a hospital or doctor's office? Yes When did it last happen?    Several Years Ago   If all above answers are NO, may proceed with cephalosporin use.      Outpatient Medications Prior to Visit  Medication Sig Dispense Refill   albuterol  (PROVENTIL ) (2.5 MG/3ML) 0.083% nebulizer solution Take 3 mLs (2.5 mg total) by nebulization every 6  (six) hours as needed for wheezing or shortness of breath. 180 mL 1   albuterol  (VENTOLIN  HFA) 108 (90 Base) MCG/ACT inhaler Inhale 2 puffs into the lungs every 6 (six) hours as needed for wheezing or shortness of breath. 18 g 2   aspirin EC 81 MG tablet Take by mouth.     cetirizine  (ZYRTEC ) 10 MG tablet Take 1 tablet (10 mg total) by mouth daily. 100 tablet 1   cholecalciferol 25 MCG (1000 UT) tablet Take 1,000 Units by mouth daily.     fluticasone  (FLONASE ) 50 MCG/ACT nasal spray Place 2 sprays into both nostrils daily. 16 g 6   levothyroxine  (SYNTHROID ) 25 MCG  tablet Take 1 tablet (25 mcg total) by mouth daily. 90 tablet 1   omeprazole  (PRILOSEC) 40 MG capsule Take 1 capsule (40 mg total) by mouth daily. 30 capsule 3   Tiotropium Bromide-Olodaterol (STIOLTO RESPIMAT ) 2.5-2.5 MCG/ACT AERS Inhale 2 puffs into the lungs daily. 4 g 6   tiZANidine  (ZANAFLEX ) 4 MG tablet Take 1 tablet (4 mg total) by mouth every 8 (eight) hours as needed. 60 tablet 1   amLODipine  (NORVASC ) 10 MG tablet Take 1 tablet (10 mg total) by mouth daily. 90 tablet 1   atorvastatin  (LIPITOR) 80 MG tablet Take 1 tablet (80 mg total) by mouth at bedtime. 90 tablet 3   Facility-Administered Medications Prior to Visit  Medication Dose Route Frequency Provider Last Rate Last Admin   acetaminophen  (TYLENOL ) tablet 650 mg  650 mg Oral Q6H PRN Brien Belvie BRAVO, MD   650 mg at 03/14/23 0922     ROS Review of Systems  Constitutional:  Negative for activity change and appetite change.  HENT:  Negative for sinus pressure and sore throat.   Respiratory:  Negative for chest tightness, shortness of breath and wheezing.   Cardiovascular:  Negative for chest pain and palpitations.  Gastrointestinal:  Positive for constipation. Negative for abdominal distention and abdominal pain.  Genitourinary: Negative.   Musculoskeletal:        See HPI  Psychiatric/Behavioral:  Negative for behavioral problems and dysphoric mood.      Objective:  BP 135/81   Pulse 85   Ht 5' 6 (1.676 m)   Wt 156 lb 12.8 oz (71.1 kg)   SpO2 98%   BMI 25.31 kg/m      02/17/2024    1:56 PM 11/07/2023   11:26 AM 09/10/2023    3:42 PM  BP/Weight  Systolic BP 135 121   Diastolic BP 81 76   Wt. (Lbs) 156.8 159.2 160  BMI 25.31 kg/m2 25.7 kg/m2 25.82 kg/m2      Physical Exam Constitutional:      Appearance: She is well-developed.  Cardiovascular:     Rate and Rhythm: Normal rate.     Heart sounds: Normal heart sounds. No murmur heard. Pulmonary:     Effort: Pulmonary effort is normal.     Breath sounds: Normal breath sounds. No wheezing or rales.  Chest:     Chest wall: No tenderness.  Abdominal:     General: Bowel sounds are normal. There is no distension.     Palpations: Abdomen is soft. There is no mass.     Tenderness: There is no abdominal tenderness.  Musculoskeletal:        General: Normal range of motion.     Right lower leg: No edema.     Left lower leg: No edema.     Comments: Negative straight leg raise bilaterally  Neurological:     Mental Status: She is alert and oriented to person, place, and time.  Psychiatric:        Mood and Affect: Mood normal.        Latest Ref Rng & Units 11/07/2023   12:23 PM 05/16/2023    2:08 PM 02/08/2023   11:05 AM  CMP  Glucose 70 - 99 mg/dL 97  85  875   BUN 8 - 27 mg/dL 13  6  9    Creatinine 0.57 - 1.00 mg/dL 9.04  9.07  9.12   Sodium 134 - 144 mmol/L 143  146  144   Potassium 3.5 - 5.2 mmol/L  5.0  4.0  4.3   Chloride 96 - 106 mmol/L 105  106  107   CO2 20 - 29 mmol/L 22  16  22    Calcium  8.7 - 10.3 mg/dL 89.8  89.8  9.5   Total Protein 6.0 - 8.5 g/dL 7.1  7.5  6.7   Total Bilirubin 0.0 - 1.2 mg/dL 0.3  0.2  0.3   Alkaline Phos 44 - 121 IU/L 99  114  93   AST 0 - 40 IU/L 16  24  37   ALT 0 - 32 IU/L 15  14  42     Lipid Panel     Component Value Date/Time   CHOL 142 11/07/2023 1223   TRIG 55 11/07/2023 1223   HDL 59 11/07/2023 1223   CHOLHDL 2.4  04/18/2021 1600   CHOLHDL 2.9 08/26/2015 0945   VLDL 12 08/26/2015 0945   LDLCALC 71 11/07/2023 1223    CBC    Component Value Date/Time   WBC 4.8 05/16/2023 1408   WBC 3.3 (L) 09/17/2022 0406   RBC 5.36 (H) 05/16/2023 1408   RBC 5.04 09/17/2022 0406   HGB 13.4 05/16/2023 1408   HGB 12.8 11/18/2014 0902   HCT 42.0 05/16/2023 1408   HCT 38.9 11/18/2014 0902   PLT 423 05/16/2023 1408   MCV 78 (L) 05/16/2023 1408   MCV 82.4 11/18/2014 0902   MCH 25.0 (L) 05/16/2023 1408   MCH 26.4 09/17/2022 0406   MCHC 31.9 05/16/2023 1408   MCHC 32.2 09/17/2022 0406   RDW 15.3 05/16/2023 1408   RDW 13.2 11/18/2014 0902   LYMPHSABS 2.0 05/16/2023 1408   LYMPHSABS 0.9 11/18/2014 0902   MONOABS 0.2 09/17/2022 0406   MONOABS 0.3 11/18/2014 0902   EOSABS 0.1 05/16/2023 1408   BASOSABS 0.0 05/16/2023 1408   BASOSABS 0.0 11/18/2014 0902    Lab Results  Component Value Date   HGBA1C 6.0 08/07/2023    Lab Results  Component Value Date   TSH 0.048 (L) 11/07/2023       Assessment & Plan Hypothyroidism Managed with levothyroxine  25 mcg daily. Re-evaluation needed to determine if dose is appropriate or discontinuation is possible.  She would rather come off the levothyroxine .  Advised two normal tests before considering discontinuation. - Order thyroid  function test to assess current thyroid  status. - Continue levothyroxine  25 mcg daily until test results are reviewed. - Discuss potential discontinuation of levothyroxine  if thyroid  function is stable and normal.  Essential hypertension Blood pressure well-controlled with amlodipine  10 mg daily. - Continue amlodipine  10 mg oral daily. - Refill prescription for amlodipine .  Dyslipidemia Managed with atorvastatin  80 mg at bedtime. - Continue atorvastatin  80 mg oral at bedtime. - Refill prescription for atorvastatin .  Chronic right leg weakness Reports chronic leg weakness, scheduled to follow up with a specialist in  November.   Prediabetes Labs reveal prediabetes with an A1c of 6.0.  An A1c of 6.5 is supportive of a diagnosis of type 2 diabetes mellitus.  Working on a low carbohydrate diet, exercise, weight loss is recommended in order to prevent progression to type 2 diabetes mellitus.      Healthcare maintenance Smoking greater than 20-pack-year Low-dose CT for lung cancer screening ordered  Meds ordered this encounter  Medications   amLODipine  (NORVASC ) 10 MG tablet    Sig: Take 1 tablet (10 mg total) by mouth daily.    Dispense:  90 tablet    Refill:  1   atorvastatin  (LIPITOR) 80  MG tablet    Sig: Take 1 tablet (80 mg total) by mouth at bedtime.    Dispense:  90 tablet    Refill:  1    Follow-up: Return in about 6 months (around 08/19/2024) for Chronic medical conditions.       Corrina Sabin, MD, FAAFP. [facility] [place], [place] [phone]   02/17/2024, 2:38 PM

## 2024-02-18 ENCOUNTER — Ambulatory Visit: Payer: Self-pay | Admitting: Family Medicine

## 2024-02-18 ENCOUNTER — Other Ambulatory Visit: Payer: Self-pay

## 2024-02-18 DIAGNOSIS — I38 Endocarditis, valve unspecified: Secondary | ICD-10-CM

## 2024-02-18 DIAGNOSIS — E039 Hypothyroidism, unspecified: Secondary | ICD-10-CM

## 2024-02-18 LAB — T3: T3, Total: 155 ng/dL (ref 71–180)

## 2024-02-18 LAB — TSH: TSH: 5.92 u[IU]/mL — ABNORMAL HIGH (ref 0.450–4.500)

## 2024-02-18 LAB — T4, FREE: Free T4: 0.91 ng/dL (ref 0.82–1.77)

## 2024-02-18 LAB — HEMOGLOBIN A1C
Est. average glucose Bld gHb Est-mCnc: 128 mg/dL
Hgb A1c MFr Bld: 6.1 % — ABNORMAL HIGH (ref 4.8–5.6)

## 2024-02-18 MED ORDER — LEVOTHYROXINE SODIUM 50 MCG PO TABS
50.0000 ug | ORAL_TABLET | Freq: Every day | ORAL | 1 refills | Status: AC
  Filled 2024-02-18: qty 90, 90d supply, fill #0
  Filled 2024-06-04: qty 90, 90d supply, fill #1

## 2024-02-21 ENCOUNTER — Ambulatory Visit
Admission: RE | Admit: 2024-02-21 | Discharge: 2024-02-21 | Disposition: A | Discharge status: Home / Self Care | Source: Ambulatory Visit | Attending: Family Medicine | Admitting: Family Medicine

## 2024-02-21 DIAGNOSIS — Z87891 Personal history of nicotine dependence: Secondary | ICD-10-CM | POA: Diagnosis not present

## 2024-02-21 DIAGNOSIS — F17210 Nicotine dependence, cigarettes, uncomplicated: Secondary | ICD-10-CM

## 2024-02-21 DIAGNOSIS — Z122 Encounter for screening for malignant neoplasm of respiratory organs: Secondary | ICD-10-CM | POA: Diagnosis not present

## 2024-02-24 ENCOUNTER — Telehealth: Payer: Self-pay

## 2024-02-24 ENCOUNTER — Other Ambulatory Visit: Payer: Self-pay

## 2024-02-24 NOTE — Telephone Encounter (Signed)
 Copied from CRM #[id number]. Topic: Clinical - Lab/Test Results >> Feb 24, 2024  1:28 PM Montie POUR wrote: Reason for CRM:  I read her lab results: Hello, Your labs reveal you need an increased dose of Levothyroxine  and so I sent a prescription for 50mcg to your pharmacy. We will repeat labs again at your next visit -Best, Dr Delbert She had no questions or concerns.

## 2024-02-24 NOTE — Telephone Encounter (Signed)
 Noted

## 2024-02-26 ENCOUNTER — Other Ambulatory Visit: Payer: Self-pay

## 2024-03-05 ENCOUNTER — Other Ambulatory Visit: Payer: Self-pay

## 2024-04-15 ENCOUNTER — Ambulatory Visit (HOSPITAL_COMMUNITY)
Admission: RE | Admit: 2024-04-15 | Discharge: 2024-04-15 | Disposition: A | Discharge status: Home / Self Care | Source: Ambulatory Visit | Attending: Family Medicine | Admitting: Family Medicine

## 2024-04-15 DIAGNOSIS — I38 Endocarditis, valve unspecified: Secondary | ICD-10-CM | POA: Diagnosis present

## 2024-04-15 DIAGNOSIS — I359 Nonrheumatic aortic valve disorder, unspecified: Secondary | ICD-10-CM | POA: Insufficient documentation

## 2024-04-15 LAB — ECHOCARDIOGRAM COMPLETE
Area-P 1/2: 3.91 cm2
S' Lateral: 2.7 cm

## 2024-04-16 ENCOUNTER — Ambulatory Visit: Payer: Self-pay | Admitting: Family Medicine

## 2024-04-17 NOTE — Telephone Encounter (Signed)
 Copied from CRM #[id number]. Topic: Clinical - Lab/Test Results >> Apr 17, 2024 12:36 PM Charlet HERO wrote: Reason for CRM: patient is calling to have someone go over echocardiogram with her ckd the chart and Hello, The echocardiogram performed to evaluate calcium  deposits on your hear valve reveal the heart is pumping well , there is no evidence of heart failure and there are no concerning findings. -Dr Newlin

## 2024-04-20 ENCOUNTER — Ambulatory Visit: Payer: Self-pay

## 2024-04-20 NOTE — Telephone Encounter (Signed)
 FYI Only or Action Required?: FYI only for provider.  Patient was last seen in primary care on 02/17/2024 by Newlin, Enobong, MD.  Called Nurse Triage reporting Advice Only.  Symptoms began today.  Triage Disposition: Information or Advice Only Call  Patient/caregiver understands and will follow disposition?: Yes    Copied from CRM #[id number]. Topic: Clinical - Lab/Test Results >> Apr 20, 2024  3:37 PM Martha Koch wrote: Reason for CRM: Patient calling for test results from ECHO COMPLETE WO IMAGING ENHANCING AGENT. Contacted CAL who advised no one available to read and to contact NT. Warm transferred to NT Reason for Disposition  Health information question, no triage required and triager able to answer question  Answer Assessment - Initial Assessment Questions 1. REASON FOR CALL: What is the main reason for your call? or How can I best help you?  Pt called to get imaging results: nurse checked chart: no results in chart: informed pt no results to be given at present time.  Protocols used: Information Only Call - No Triage-A-AH

## 2024-04-21 NOTE — Telephone Encounter (Signed)
 Left message on voicemail to return call.  Advised to check mychart or call office back for results.

## 2024-04-22 NOTE — Telephone Encounter (Signed)
 Copied from CRM [id number]. Topic: Clinical - Lab/Test Results >> Apr 21, 2024  5:04 PM [name] wrote:  Reason for CRM: Pt calling for 04/16/2024 Results Follow-Up ECHOCARDIOGRAM COMPLETE. Please call pt at [phone].

## 2024-05-04 ENCOUNTER — Encounter: Payer: Self-pay | Admitting: Radiology

## 2024-05-04 ENCOUNTER — Ambulatory Visit: Payer: Self-pay | Admitting: Family Medicine

## 2024-05-04 DIAGNOSIS — I739 Peripheral vascular disease, unspecified: Secondary | ICD-10-CM | POA: Diagnosis not present

## 2024-05-04 NOTE — Telephone Encounter (Signed)
 This RN made first attempt to contact pt with no answer. A voicemail was left with call back number provided.    Copied from CRM #[id number]. Topic: Clinical - Prescription Issue >> May 04, 2024  2:18 PM Darshell M wrote: Reason for CRM: Patient requesting prescription for muscle relaxer for leg pain associated with PDD. Patient CB# [phone]

## 2024-05-04 NOTE — Telephone Encounter (Signed)
 This RN made second attempt to contact pt with no answer. A voicemail was left with call back number provided.

## 2024-05-04 NOTE — Telephone Encounter (Signed)
  This RN made third attempt to contact pt with no answer. A voicemail was left with call back number provided.

## 2024-05-06 NOTE — Telephone Encounter (Signed)
 LVM informing patient that she currently has a muscle relaxer on file and if she wants that refilled or she is requesting something different.

## 2024-05-07 ENCOUNTER — Other Ambulatory Visit: Payer: Self-pay

## 2024-05-07 ENCOUNTER — Other Ambulatory Visit: Payer: Self-pay | Admitting: Family Medicine

## 2024-05-07 MED ORDER — TIZANIDINE HCL 4 MG PO TABS
4.0000 mg | ORAL_TABLET | Freq: Three times a day (TID) | ORAL | 1 refills | Status: AC | PRN
  Filled 2024-05-07: qty 60, 20d supply, fill #0
  Filled 2024-07-09: qty 60, 20d supply, fill #1

## 2024-05-07 NOTE — Telephone Encounter (Signed)
 Muscle relaxer has been sent to pharmacy.

## 2024-05-07 NOTE — Telephone Encounter (Signed)
>>   May 07, 2024 12:56 PM Antony RAMAN wrote:.  Patient retuning call about the muscle relaxer, doesn't know the name. Please call patient back she said anytime is fine. She said the same one is fine

## 2024-05-07 NOTE — Telephone Encounter (Unsigned)
 Copied from CRM [id number]. Topic: Clinical - Medication Refill >> May 07, 2024  1:05 PM Santiya F wrote: Medication: tiZANidine  (ZANAFLEX ) 4 MG tablet [[id number]]  Has the patient contacted their pharmacy? Yes  (Agent: If yes, when and what did the pharmacy advise?) contact office   This is the patient's preferred pharmacy:  [facility] [address] [place] [address] Phone: [phone] Fax: [phone]  Is this the correct pharmacy for this prescription? Yes If no, delete pharmacy and type the correct one.   Has the prescription been filled recently? Yes  Is the patient out of the medication? No  Has the patient been seen for an appointment in the last year OR does the patient have an upcoming appointment? Yes  Can we respond through MyChart? Yes  Agent: Please be advised that Rx refills may take up to 3 business days. We ask that you follow-up with your pharmacy.   Patient is requesting a call when the medication is sent.

## 2024-05-08 ENCOUNTER — Other Ambulatory Visit: Payer: Self-pay

## 2024-06-04 ENCOUNTER — Other Ambulatory Visit: Payer: Self-pay

## 2024-06-05 ENCOUNTER — Other Ambulatory Visit: Payer: Self-pay

## 2024-06-08 ENCOUNTER — Other Ambulatory Visit: Payer: Self-pay | Admitting: Pharmacist

## 2024-06-08 ENCOUNTER — Other Ambulatory Visit: Payer: Self-pay

## 2024-06-08 NOTE — Progress Notes (Signed)
 Pharmacy Quality Measure Review  This patient is appearing on a report for the adherence measure for cholesterol (statin) medications this calendar year.   Medication: atorvastatin  Last fill date: 02/24/24 for 90 day supply  Contacted pharmacy to facilitate refills.  Herlene Fleeta Morris, PharmD, JAQUELINE, CPP Clinical Pharmacist [facility] [phone]

## 2024-06-11 ENCOUNTER — Other Ambulatory Visit: Payer: Self-pay | Admitting: Pharmacist

## 2024-06-11 NOTE — Progress Notes (Signed)
 Pharmacy Quality Measure Review  This patient is appearing on a report for the adherence measure for cholesterol (statin) medications this calendar year.   Medication: atorvastatin  Last fill date: 02/24/24 for 90 day supply  Contacted pharmacy to facilitate refills 06/08/24. Has not picked-up. Called and encouraged patient to pick-up.   Herlene Fleeta Morris, PharmD, JAQUELINE, CPP Clinical Pharmacist [facility] [phone]

## 2024-06-17 ENCOUNTER — Other Ambulatory Visit: Payer: Self-pay

## 2024-07-09 ENCOUNTER — Other Ambulatory Visit: Payer: Self-pay | Admitting: Family Medicine

## 2024-07-09 ENCOUNTER — Other Ambulatory Visit: Payer: Self-pay

## 2024-07-09 MED ORDER — OMEPRAZOLE 40 MG PO CPDR
40.0000 mg | DELAYED_RELEASE_CAPSULE | Freq: Every day | ORAL | 0 refills | Status: AC
  Filled 2024-07-09: qty 30, 30d supply, fill #0

## 2024-07-10 ENCOUNTER — Other Ambulatory Visit: Payer: Self-pay

## 2024-08-19 ENCOUNTER — Ambulatory Visit: Admitting: Family Medicine

## 2024-09-15 ENCOUNTER — Ambulatory Visit
